# Patient Record
Sex: Female | Born: 1935 | Race: Black or African American | Hispanic: No | Smoking: Former smoker
Health system: Southern US, Community
[De-identification: ages and names within clinical notes are randomized; demographics above are authoritative.]

## PROBLEM LIST (undated history)

## (undated) DIAGNOSIS — R42 Dizziness and giddiness: Secondary | ICD-10-CM

## (undated) DIAGNOSIS — K921 Melena: Secondary | ICD-10-CM

## (undated) DIAGNOSIS — I509 Heart failure, unspecified: Secondary | ICD-10-CM

## (undated) DIAGNOSIS — R7989 Other specified abnormal findings of blood chemistry: Secondary | ICD-10-CM

## (undated) DIAGNOSIS — I421 Obstructive hypertrophic cardiomyopathy: Secondary | ICD-10-CM

## (undated) DIAGNOSIS — N390 Urinary tract infection, site not specified: Secondary | ICD-10-CM

## (undated) DIAGNOSIS — R51 Headache: Secondary | ICD-10-CM

## (undated) DIAGNOSIS — I671 Cerebral aneurysm, nonruptured: Secondary | ICD-10-CM

## (undated) DIAGNOSIS — R079 Chest pain, unspecified: Secondary | ICD-10-CM

## (undated) DIAGNOSIS — K2970 Gastritis, unspecified, without bleeding: Secondary | ICD-10-CM

## (undated) DIAGNOSIS — I2699 Other pulmonary embolism without acute cor pulmonale: Secondary | ICD-10-CM

## (undated) DIAGNOSIS — K760 Fatty (change of) liver, not elsewhere classified: Secondary | ICD-10-CM

## (undated) DIAGNOSIS — E785 Hyperlipidemia, unspecified: Secondary | ICD-10-CM

## (undated) DIAGNOSIS — I609 Nontraumatic subarachnoid hemorrhage, unspecified: Secondary | ICD-10-CM

## (undated) DIAGNOSIS — Z7901 Long term (current) use of anticoagulants: Secondary | ICD-10-CM

## (undated) DIAGNOSIS — I4891 Unspecified atrial fibrillation: Secondary | ICD-10-CM

## (undated) DIAGNOSIS — R0602 Shortness of breath: Secondary | ICD-10-CM

## (undated) DIAGNOSIS — I82409 Acute embolism and thrombosis of unspecified deep veins of unspecified lower extremity: Secondary | ICD-10-CM

## (undated) DIAGNOSIS — N183 Chronic kidney disease, stage 3 (moderate): Secondary | ICD-10-CM

## (undated) DIAGNOSIS — E119 Type 2 diabetes mellitus without complications: Secondary | ICD-10-CM

## (undated) DIAGNOSIS — I480 Paroxysmal atrial fibrillation: Secondary | ICD-10-CM

## (undated) DIAGNOSIS — D689 Coagulation defect, unspecified: Secondary | ICD-10-CM

## (undated) DIAGNOSIS — Z8739 Personal history of other diseases of the musculoskeletal system and connective tissue: Secondary | ICD-10-CM

## (undated) DIAGNOSIS — R011 Cardiac murmur, unspecified: Secondary | ICD-10-CM

## (undated) DIAGNOSIS — I517 Cardiomegaly: Secondary | ICD-10-CM

## (undated) DIAGNOSIS — R002 Palpitations: Secondary | ICD-10-CM

## (undated) DIAGNOSIS — R109 Unspecified abdominal pain: Secondary | ICD-10-CM

## (undated) DIAGNOSIS — M810 Age-related osteoporosis without current pathological fracture: Secondary | ICD-10-CM

## (undated) DIAGNOSIS — I951 Orthostatic hypotension: Secondary | ICD-10-CM

## (undated) DIAGNOSIS — R300 Dysuria: Secondary | ICD-10-CM

## (undated) DIAGNOSIS — I10 Essential (primary) hypertension: Secondary | ICD-10-CM

## (undated) DIAGNOSIS — N200 Calculus of kidney: Secondary | ICD-10-CM

## (undated) DIAGNOSIS — J9601 Acute respiratory failure with hypoxia: Secondary | ICD-10-CM

## (undated) DIAGNOSIS — N644 Mastodynia: Secondary | ICD-10-CM

## (undated) HISTORY — PX: OTHER SURGICAL HISTORY: SHX169

## (undated) HISTORY — DX: Unspecified abdominal pain: R10.9

## (undated) HISTORY — DX: Melena: K92.1

## (undated) HISTORY — DX: Unspecified atrial fibrillation: I48.91

## (undated) HISTORY — DX: Other pulmonary embolism without acute cor pulmonale: I26.99

## (undated) HISTORY — DX: Cardiac murmur, unspecified: R01.1

## (undated) HISTORY — DX: Palpitations: R00.2

## (undated) HISTORY — DX: Shortness of breath: R06.02

## (undated) HISTORY — DX: Chest pain, unspecified: R07.9

## (undated) HISTORY — DX: Chronic kidney disease, stage 3 (moderate): N18.3

## (undated) HISTORY — DX: Cardiomegaly: I51.7

## (undated) HISTORY — DX: Dizziness and giddiness: R42

## (undated) HISTORY — DX: Headache: R51

## (undated) HISTORY — DX: Essential (primary) hypertension: I10

## (undated) HISTORY — DX: Gastritis, unspecified, without bleeding: K29.70

## (undated) HISTORY — DX: Paroxysmal atrial fibrillation: I48.0

## (undated) HISTORY — DX: Long term (current) use of anticoagulants: Z79.01

## (undated) HISTORY — DX: Type 2 diabetes mellitus without complications: E11.9

## (undated) HISTORY — DX: Fatty (change of) liver, not elsewhere classified: K76.0

## (undated) HISTORY — DX: Orthostatic hypotension: I95.1

## (undated) HISTORY — DX: Cerebral aneurysm, nonruptured: I67.1

## (undated) HISTORY — DX: Hyperlipidemia, unspecified: E78.5

## (undated) HISTORY — DX: Other specified abnormal findings of blood chemistry: R79.89

## (undated) HISTORY — DX: Personal history of other diseases of the musculoskeletal system and connective tissue: Z87.39

## (undated) HISTORY — DX: Dysuria: R30.0

## (undated) HISTORY — DX: Urinary tract infection, site not specified: N39.0

## (undated) HISTORY — DX: Acute respiratory failure with hypoxia: J96.01

## (undated) HISTORY — DX: Coagulation defect, unspecified: D68.9

## (undated) HISTORY — DX: Acute embolism and thrombosis of unspecified deep veins of unspecified lower extremity: I82.409

## (undated) HISTORY — DX: Heart failure, unspecified: I50.9

## (undated) HISTORY — DX: Mastodynia: N64.4

## (undated) HISTORY — DX: Calculus of kidney: N20.0

## (undated) HISTORY — DX: Obstructive hypertrophic cardiomyopathy: I42.1

## (undated) HISTORY — DX: Nontraumatic subarachnoid hemorrhage, unspecified: I60.9

---
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
## 1998-09-05 DIAGNOSIS — I480 Paroxysmal atrial fibrillation: Secondary | ICD-10-CM

## 1998-09-05 DIAGNOSIS — I609 Nontraumatic subarachnoid hemorrhage, unspecified: Secondary | ICD-10-CM

## 1998-09-05 HISTORY — DX: Paroxysmal atrial fibrillation: I48.0

## 1998-09-05 HISTORY — DX: Nontraumatic subarachnoid hemorrhage, unspecified: I60.9

## 1998-09-05 HISTORY — PX: ABDOMINAL HYSTERECTOMY: SHX81

## 1998-09-18 ENCOUNTER — Other Ambulatory Visit: Admission: RE | Admit: 1998-09-18 | Discharge: 1998-09-18 | Payer: Self-pay | Admitting: *Deleted

## 1999-03-23 ENCOUNTER — Inpatient Hospital Stay (HOSPITAL_COMMUNITY): Admission: EM | Admit: 1999-03-23 | Discharge: 1999-04-04 | Payer: Self-pay | Admitting: Emergency Medicine

## 1999-03-23 ENCOUNTER — Encounter: Payer: Self-pay | Admitting: Neurosurgery

## 1999-03-24 ENCOUNTER — Encounter: Payer: Self-pay | Admitting: Neurosurgery

## 1999-03-25 ENCOUNTER — Encounter: Payer: Self-pay | Admitting: Neurosurgery

## 1999-03-26 ENCOUNTER — Encounter: Payer: Self-pay | Admitting: Neurosurgery

## 1999-03-27 ENCOUNTER — Encounter: Payer: Self-pay | Admitting: Neurosurgery

## 1999-03-28 ENCOUNTER — Encounter: Payer: Self-pay | Admitting: Neurosurgery

## 1999-03-31 ENCOUNTER — Encounter: Payer: Self-pay | Admitting: Neurosurgery

## 1999-05-18 ENCOUNTER — Encounter: Payer: Self-pay | Admitting: Neurosurgery

## 1999-05-18 ENCOUNTER — Ambulatory Visit (HOSPITAL_COMMUNITY): Admission: RE | Admit: 1999-05-18 | Discharge: 1999-05-18 | Payer: Self-pay | Admitting: Neurosurgery

## 1999-07-10 ENCOUNTER — Encounter: Payer: Self-pay | Admitting: Emergency Medicine

## 1999-07-11 ENCOUNTER — Encounter: Payer: Self-pay | Admitting: Internal Medicine

## 1999-07-11 ENCOUNTER — Inpatient Hospital Stay (HOSPITAL_COMMUNITY): Admission: EM | Admit: 1999-07-11 | Discharge: 1999-07-16 | Payer: Self-pay | Admitting: Emergency Medicine

## 2003-05-20 ENCOUNTER — Encounter: Payer: Self-pay | Admitting: Internal Medicine

## 2003-05-20 ENCOUNTER — Encounter: Admission: RE | Admit: 2003-05-20 | Discharge: 2003-05-20 | Payer: Self-pay | Admitting: Internal Medicine

## 2004-09-30 ENCOUNTER — Ambulatory Visit: Payer: Self-pay | Admitting: Internal Medicine

## 2004-10-14 ENCOUNTER — Ambulatory Visit: Payer: Self-pay | Admitting: Internal Medicine

## 2004-11-12 ENCOUNTER — Ambulatory Visit: Payer: Self-pay | Admitting: Internal Medicine

## 2004-12-30 ENCOUNTER — Ambulatory Visit: Payer: Self-pay | Admitting: Internal Medicine

## 2005-02-28 ENCOUNTER — Ambulatory Visit: Payer: Self-pay | Admitting: Internal Medicine

## 2005-08-12 ENCOUNTER — Ambulatory Visit: Payer: Self-pay | Admitting: Cardiology

## 2005-09-09 ENCOUNTER — Ambulatory Visit: Payer: Self-pay | Admitting: Internal Medicine

## 2005-11-17 ENCOUNTER — Ambulatory Visit: Payer: Self-pay | Admitting: Internal Medicine

## 2006-04-11 ENCOUNTER — Ambulatory Visit: Payer: Self-pay | Admitting: Internal Medicine

## 2006-04-13 ENCOUNTER — Ambulatory Visit: Payer: Self-pay | Admitting: Internal Medicine

## 2006-12-06 ENCOUNTER — Ambulatory Visit: Payer: Self-pay | Admitting: Internal Medicine

## 2006-12-07 ENCOUNTER — Ambulatory Visit: Payer: Self-pay | Admitting: Cardiology

## 2006-12-18 ENCOUNTER — Ambulatory Visit: Payer: Self-pay | Admitting: Internal Medicine

## 2007-05-28 ENCOUNTER — Ambulatory Visit: Payer: Self-pay | Admitting: Internal Medicine

## 2007-06-01 ENCOUNTER — Ambulatory Visit: Payer: Self-pay | Admitting: Internal Medicine

## 2007-06-01 LAB — CONVERTED CEMR LAB
AST: 21 units/L (ref 0–37)
LDL Cholesterol: 87 mg/dL (ref 0–99)
VLDL: 15 mg/dL (ref 0–40)

## 2007-06-11 ENCOUNTER — Ambulatory Visit: Payer: Self-pay | Admitting: Internal Medicine

## 2007-10-11 ENCOUNTER — Ambulatory Visit: Payer: Self-pay | Admitting: Cardiology

## 2008-01-23 ENCOUNTER — Encounter: Payer: Self-pay | Admitting: Internal Medicine

## 2008-03-21 ENCOUNTER — Ambulatory Visit: Payer: Self-pay | Admitting: Internal Medicine

## 2008-05-06 HISTORY — PX: CARDIAC CATHETERIZATION: SHX172

## 2008-05-20 ENCOUNTER — Ambulatory Visit: Payer: Self-pay | Admitting: Cardiology

## 2008-05-20 ENCOUNTER — Ambulatory Visit: Payer: Self-pay | Admitting: Internal Medicine

## 2008-05-20 ENCOUNTER — Observation Stay (HOSPITAL_COMMUNITY): Admission: AD | Admit: 2008-05-20 | Discharge: 2008-05-22 | Payer: Self-pay | Admitting: Internal Medicine

## 2008-05-20 ENCOUNTER — Telehealth: Payer: Self-pay | Admitting: Internal Medicine

## 2008-05-20 DIAGNOSIS — I4891 Unspecified atrial fibrillation: Secondary | ICD-10-CM

## 2008-05-20 DIAGNOSIS — I609 Nontraumatic subarachnoid hemorrhage, unspecified: Secondary | ICD-10-CM

## 2008-05-20 DIAGNOSIS — E1169 Type 2 diabetes mellitus with other specified complication: Secondary | ICD-10-CM | POA: Insufficient documentation

## 2008-05-20 DIAGNOSIS — I421 Obstructive hypertrophic cardiomyopathy: Secondary | ICD-10-CM | POA: Insufficient documentation

## 2008-05-20 DIAGNOSIS — I10 Essential (primary) hypertension: Secondary | ICD-10-CM

## 2008-05-20 DIAGNOSIS — E785 Hyperlipidemia, unspecified: Secondary | ICD-10-CM

## 2008-05-20 HISTORY — DX: Unspecified atrial fibrillation: I48.91

## 2008-05-20 HISTORY — DX: Obstructive hypertrophic cardiomyopathy: I42.1

## 2008-05-30 ENCOUNTER — Ambulatory Visit: Payer: Self-pay | Admitting: Internal Medicine

## 2008-05-30 LAB — CONVERTED CEMR LAB
Bilirubin Urine: NEGATIVE
Nitrite: NEGATIVE

## 2008-12-08 ENCOUNTER — Encounter: Payer: Self-pay | Admitting: Internal Medicine

## 2008-12-08 ENCOUNTER — Ambulatory Visit: Payer: Self-pay | Admitting: Internal Medicine

## 2008-12-08 LAB — CONVERTED CEMR LAB
CO2: 30 meq/L (ref 19–32)
Calcium: 9.5 mg/dL (ref 8.4–10.5)
Creatinine, Ser: 1.3 mg/dL — ABNORMAL HIGH (ref 0.4–1.2)

## 2009-04-02 ENCOUNTER — Encounter: Payer: Self-pay | Admitting: Internal Medicine

## 2009-04-14 ENCOUNTER — Encounter: Payer: Self-pay | Admitting: Internal Medicine

## 2009-05-04 ENCOUNTER — Encounter: Payer: Self-pay | Admitting: Internal Medicine

## 2009-07-21 ENCOUNTER — Encounter (INDEPENDENT_AMBULATORY_CARE_PROVIDER_SITE_OTHER): Payer: Self-pay | Admitting: *Deleted

## 2009-09-03 ENCOUNTER — Ambulatory Visit: Payer: Self-pay | Admitting: Internal Medicine

## 2009-09-03 LAB — CONVERTED CEMR LAB
Bilirubin Urine: NEGATIVE
Protein, U semiquant: NEGATIVE
Specific Gravity, Urine: 1.015
pH: 5

## 2009-09-11 ENCOUNTER — Telehealth: Payer: Self-pay | Admitting: Internal Medicine

## 2009-09-24 ENCOUNTER — Ambulatory Visit: Payer: Self-pay | Admitting: Internal Medicine

## 2009-11-09 ENCOUNTER — Telehealth (INDEPENDENT_AMBULATORY_CARE_PROVIDER_SITE_OTHER): Payer: Self-pay | Admitting: *Deleted

## 2009-12-09 ENCOUNTER — Telehealth: Payer: Self-pay | Admitting: Cardiology

## 2009-12-11 ENCOUNTER — Telehealth: Payer: Self-pay | Admitting: Internal Medicine

## 2009-12-23 ENCOUNTER — Ambulatory Visit: Payer: Self-pay | Admitting: Internal Medicine

## 2009-12-23 LAB — CONVERTED CEMR LAB
Glucose, Urine, Semiquant: NEGATIVE
Nitrite: NEGATIVE
pH: 5

## 2010-06-08 ENCOUNTER — Ambulatory Visit: Payer: Self-pay | Admitting: Internal Medicine

## 2010-06-08 DIAGNOSIS — R51 Headache: Secondary | ICD-10-CM

## 2010-06-08 DIAGNOSIS — R519 Headache, unspecified: Secondary | ICD-10-CM

## 2010-06-08 HISTORY — DX: Headache, unspecified: R51.9

## 2010-08-16 ENCOUNTER — Ambulatory Visit: Payer: Self-pay | Admitting: Internal Medicine

## 2010-08-23 LAB — CONVERTED CEMR LAB
BUN: 24 mg/dL — ABNORMAL HIGH (ref 6–23)
CO2: 28 meq/L (ref 19–32)
Chloride: 106 meq/L (ref 96–112)
Cholesterol: 158 mg/dL (ref 0–200)
Creatinine, Ser: 1.4 mg/dL — ABNORMAL HIGH (ref 0.4–1.2)
Glucose, Bld: 87 mg/dL (ref 70–99)
LDL Cholesterol: 86 mg/dL (ref 0–99)
Total CHOL/HDL Ratio: 3

## 2010-09-07 ENCOUNTER — Encounter: Payer: Self-pay | Admitting: Internal Medicine

## 2010-10-05 NOTE — Progress Notes (Signed)
Summary: REFILL   Phone Note Refill Request Message from:  Patient on December 11, 2009 12:24 PM  Refills Requested: Medication #1:  LOSARTAN 100MG  SEND TO [facility] [address] [phone]  Initial call taken by: Judie Grieve,  December 11, 2009 12:26 PM     Appended Document: REFILL done DAJ    Prescriptions: COZAAR 100 MG TABS (LOSARTAN POTASSIUM) 1 tab once daily  #30 x 6   Entered by:   Burnett Kanaris, CNA   Authorized by:   Sherrill Raring, MD, [facility]   Signed by:   Burnett Kanaris, CNA on 12/14/2009   Method used:   [facility]ically to        [facility]       [address]       [place], [place]  [address]       Ph: [phone] or [phone]       Fax: [phone]   RxID:   [id number]

## 2010-10-05 NOTE — Assessment & Plan Note (Signed)
Summary: 9 MONTH ROV/SL  Medications Added TOPROL XL 100 MG XR24H-TAB (METOPROLOL SUCCINATE) Take 1 tablet by mouth two times a day ZOCOR 40 MG TABS (SIMVASTATIN) Take 1/2  tab by mouth at bedtime ATACAND 32 MG TABS (CANDESARTAN CILEXETIL) 1 tab once daily OMEPRAZOLE 20 MG CPDR (OMEPRAZOLE) 1 every day 1 hour prior to breakfast      Allergies Added: NKDA  Visit Type:  Follow-up Primary Provider:  Jacques Navy MD  CC:  chest discomfort.  History of Present Illness: [name] is a 75 year old with a history of PAF, LVH, subarachnoid hemorrhage.  I last saw her in April of 2010.   Since seen, she notes occasional chest pains usually once every 3 wks.  Some are asssoc with food intake.  Occasionally with activity.  No change in her abiltiy to do things.  She does note increased reflux symptoms with brackish taste in her mouth. She denies palpitations.  Current Medications (verified): 1)  Toprol Xl 100 Mg Xr24h-Tab (Metoprolol Succinate) .... Take 1 Tablet By Mouth Two Times A Day 2)  Cardene Sr 60 Mg Xr12h-Cap (Nicardipine Hcl) .... Take 1 Tablet By Mouth Every Morning 3)  Zocor 40 Mg Tabs (Simvastatin) .... Take 1 Tab By Mouth At Bedtime 4)  Norpace 150 Mg Caps (Disopyramide Phosphate) .... Take 1 Tablet By Mouth Two Times A Day 5)  Atacand 32 Mg Tabs (Candesartan Cilexetil) .Marland Kitchen.. 1 Tab Once Daily  Allergies (verified): No Known Drug Allergies  Past History:  Past Medical History: Last updated: 06/13/2008 HYPERLIPIDEMIA (ICD-272.4) Hx of SUBARACHNOID HEMORRHAGE (ICD-430) CARDIOMYOPATHY, HYPERTROPHIC (ICD-425.1) PAROXYSMAL ATRIAL FIBRILLATION (ICD-427.31) UNSPECIFIED ESSENTIAL HYPERTENSION (ICD-401.9)  Past Surgical History: Last updated: 05/20/2008 Hysterectomy  Social History: Last updated: 05/20/2008 HSG Married '61 3 sons, 1 daughter; 5 grandchildren  Review of Systems       All systems reviewed.  negatve to the above problem except as noted above.  Vital  Signs:  Patient profile:   75 year old female Height:      68 inches Weight:      168 pounds BMI:     25.64 Pulse rate:   58 / minute BP sitting:   132 / 79  (right arm)  Vitals Entered By: Burnett Kanaris, CNA (September 24, 2009 12:41 PM)  Physical Exam  Additional Exam:  HEENT:  Normocephalic, atraumatic. EOMI, PERRLA.  Neck: JVP is normal. No thyromegaly. No bruits.  Lungs: clear to auscultation. No rales no wheezes.  Heart: Regular rate and rhythm. Normal S1, S2. No S3.   Gr. I-II/VI systolic murmur at base.Marland Kitchen PMI not displaced.  Abdomen:  Supple, nontender. Normal bowel sounds. No masses. No hepatomegaly.  Extremities:   Good distal pulses throughout. No lower extremity edema.  Musculoskeletal :moving all extremities.  Neuro:   alert and oriented x3.    EKG  Procedure date:  09/24/2009  Findings:      Sinus bradycardia 58 bpm.  LVH with strain pattern.  Impression & Recommendations:  Problem # 1:  PAROXYSMAL ATRIAL FIBRILLATION (ICD-427.31) No sysmptoms to suggest recurance.  No change in meds. Her updated medication list for this problem includes:    Toprol Xl 100 Mg Xr24h-tab (Metoprolol succinate) .Marland Kitchen... Take 1 tablet by mouth two times a day    Norpace 150 Mg Caps (Disopyramide phosphate) .Marland Kitchen... Take 1 tablet by mouth two times a day  Problem # 2:  CHEST PAIN-UNSPECIFIED (ICD-786.50) I am not convinced the patients occasional episodes represent angina.  THey  sound more GI with increased reflux symptoms.  She had a normal catheterizaition in 2009. I would recomm Omeprazole to see if symtoms can be controlled.  She will call if things worsen.  Watch diet.  Problem # 3:  UNSPECIFIED ESSENTIAL HYPERTENSION (ICD-401.9) good control.  Keep on same meds.  Will set up for BMET.  Problem # 4:  HYPERLIPIDEMIA (ICD-272.4) The patient has cut her Zocor to 20 once daily.  Will set up for fasting lipids. Prescriptions: OMEPRAZOLE 20 MG CPDR (OMEPRAZOLE) 1 every day 1 hour prior  to breakfast  #30 x 6   Entered by:   Layne Benton, RN, BSN   Authorized by:   Sherrill Raring, MD, [facility]   Signed by:   Layne Benton, RN, BSN on 09/24/2009   Method used:   [facility] to        [facility]       [address]       [place], [place]  [address]       Ph: [phone] or [phone]       Fax: [phone]   RxID:   [id number]

## 2010-10-05 NOTE — Progress Notes (Signed)
Summary: Urinary frequency  Phone Note Call from Patient   Summary of Call: Pt left vm with update. Rx helped with pain but she continues to c/o urinary frequency.  Initial call taken by: Lamar Sprinkles, CMA,  September 11, 2009 3:10 PM  Follow-up for Phone Call        OK for trial of vesicare  samples monday or if patient wishes Rx can be called in for vesicare 5mg  #10, sig 1 by mouth once daily, If this works would then use generic oxybutynin 5mg  two times a day.  Follow-up by: Jacques Navy MD,  September 11, 2009 5:18 PM  Additional Follow-up for Phone Call Additional follow up Details #1::        left mess to call office back on home #, Wk # unable to speak with someone. Samples up front waiting. Additional Follow-up by: Lamar Sprinkles, CMA,  September 14, 2009 11:01 AM    Additional Follow-up for Phone Call Additional follow up Details #2::    Pt informed, she has samples that she has not tried yet given at last office visit. Pt will call back after trial  Follow-up by: Lamar Sprinkles, CMA,  September 14, 2009 12:55 PM   Appended Document: Urinary frequency Samples not picked up. Sent back to be restocked.

## 2010-10-05 NOTE — Assessment & Plan Note (Signed)
Summary: pain left side, offered sda pt req this day/cd   Vital Signs:  Patient profile:   75 year old female Height:      68 inches (172.72 cm) Weight:      163 pounds (74.09 kg) BMI:     24.87 O2 Sat:      97 % on Room air Temp:     97.1 degrees F (36.17 degrees C) oral Pulse rate:   57 / minute Pulse rhythm:   regular BP sitting:   128 / 84  (left arm) Cuff size:   regular  Vitals Entered By: Brenton Grills (December 23, 2009 4:43 PM)  O2 Flow:  Room air CC: pt c/o pain on left side, blood in urine, frequent urination, urinating a small amount when going/try to get u dip/pt could not urinate, small amount of urine/aj   Primary Care Provider:  Jacques Navy MD  CC:  pt c/o pain on left side, blood in urine, frequent urination, urinating a small amount when going/try to get u dip/pt could not urinate, and small amount of urine/aj.  History of Present Illness: Patient presents for pain in the suprapubic and groin areas. No fever or chills. NO back pain. She does admit to urgency and frequency.  Current Medications (verified): 1)  Toprol Xl 100 Mg Xr24h-Tab (Metoprolol Succinate) .... Take 1 Tablet By Mouth Two Times A Day 2)  Zocor 40 Mg Tabs (Simvastatin) .... Take 1/2  Tab By Mouth At Bedtime 3)  Norpace 150 Mg Caps (Disopyramide Phosphate) .... Take 1 Tablet By Mouth Two Times A Day 4)  Atacand 32 Mg Tabs (Candesartan Cilexetil) .Marland Kitchen.. 1 Tab Once Daily 5)  Omeprazole 20 Mg Cpdr (Omeprazole) .Marland Kitchen.. 1 Every Day 1 Hour Prior To Breakfast 6)  Nifedipine 30 Mg Xr24h-Tab (Nifedipine) .Marland Kitchen.. 1 Every Day 7)  Cozaar 100 Mg Tabs (Losartan Potassium) .Marland Kitchen.. 1 Tab Once Daily  Allergies (verified): No Known Drug Allergies  Past History:  Past Medical History: Last updated: 06/13/2008 HYPERLIPIDEMIA (ICD-272.4) Hx of SUBARACHNOID HEMORRHAGE (ICD-430) CARDIOMYOPATHY, HYPERTROPHIC (ICD-425.1) PAROXYSMAL ATRIAL FIBRILLATION (ICD-427.31) UNSPECIFIED ESSENTIAL HYPERTENSION  (ICD-401.9)  Past Surgical History: Last updated: 05/20/2008 Hysterectomy  Review of Systems  The patient denies anorexia, fever, decreased hearing, chest pain, dyspnea on exertion, peripheral edema, headaches, abdominal pain, muscle weakness, and difficulty walking.    Physical Exam  General:  alert, well-developed, well-nourished, and well-hydrated.   Head:  normocephalic and atraumatic.   Eyes:  corneas and lenses clear.   Lungs:  normal respiratory effort.   Heart:  normal rate and regular rhythm.   Abdomen:  tender to palpation in the suprapubic area and to percussion over the flanks Msk:  No deformity or scoliosis noted of thoracic or lumbar spine.   Pulses:  2+ radial Neurologic:  alert & oriented X3 and cranial nerves II-XII intact.   Skin:  turgor normal and color normal.   Psych:  Oriented X3 and normally interactive.     Impression & Recommendations:  Problem # 1:  UTI (ICD-599.0)  symptoms of UTI. U/A with blood and ketones.  Plan - tmp/smx ds two times a day x 5 days.   Her updated medication list for this problem includes:    Sulfamethoxazole-tmp Ds 800-160 Mg Tabs (Sulfamethoxazole-trimethoprim) .Marland Kitchen... 1 by mouth two times a day x 5 days  Orders: UA Dipstick w/o Micro (manual) (54098)  Complete Medication List: 1)  Toprol Xl 100 Mg Xr24h-tab (Metoprolol succinate) .... Take 1 tablet by mouth two times  a day 2)  Zocor 40 Mg Tabs (Simvastatin) .... Take 1/2  tab by mouth at bedtime 3)  Norpace 150 Mg Caps (Disopyramide phosphate) .... Take 1 tablet by mouth two times a day 4)  Atacand 32 Mg Tabs (Candesartan cilexetil) .Marland Kitchen.. 1 tab once daily 5)  Omeprazole 20 Mg Cpdr (Omeprazole) .Marland Kitchen.. 1 every day 1 hour prior to breakfast 6)  Nifedipine 30 Mg Xr24h-tab (Nifedipine) .Marland Kitchen.. 1 every day 7)  Cozaar 100 Mg Tabs (Losartan potassium) .Marland Kitchen.. 1 tab once daily 8)  Sulfamethoxazole-tmp Ds 800-160 Mg Tabs (Sulfamethoxazole-trimethoprim) .Marland Kitchen.. 1 by mouth two times a day x 5  days Prescriptions: SULFAMETHOXAZOLE-TMP DS 800-160 MG TABS (SULFAMETHOXAZOLE-TRIMETHOPRIM) 1 by mouth two times a day x 5 days  #10 x 1   Entered and Authorized by:   Jacques Navy MD   Signed by:   Jacques Navy MD on 12/23/2009   Method used:   [facility] to        [facility]       [address]       [place], [place]  [address]       Ph: [phone] or [phone]       Fax: [phone]   RxID:   [id number]   Laboratory Results   Urine Tests   Date/Time Reported: Lamar Sprinkles, CMA  December 23, 2009 5:18 PM   Routine Urinalysis   Color: straw Appearance: Hazy Glucose: negative   (Normal Range: Negative) Bilirubin: negative   (Normal Range: Negative) Ketone: negative   (Normal Range: Negative) Spec. Gravity: 1.025   (Normal Range: 1.003-1.035) Blood: moderate   (Normal Range: Negative) pH: 5.0   (Normal Range: 5.0-8.0) Protein: trace   (Normal Range: Negative) Urobilinogen: negative   (Normal Range: 0-1) Nitrite: negative   (Normal Range: Negative) Leukocyte Esterace: moderate   (Normal Range: Negative)

## 2010-10-05 NOTE — Progress Notes (Signed)
Summary: CARDENE on Backorder  Medications Added NIFEDIPINE 30 MG XR24H-TAB (NIFEDIPINE) 1 every day       Phone Note From Pharmacy    Follow-up for Phone Call        Recieved call from patient's pharmacy stating that Cardene is on long term back order and we need to change to another medication. Discussed with Shelby Dubin pharmD and Dr. Tenny Craw .Marland Kitchenok to DC Cardene and start Procardia XL 30mg  1 ever day and a bp check in 5 to 7 days. LMOM for patient  to call me back. Follow-up by: Suzan Garibaldi RN  Additional Follow-up for Phone Call Additional follow up Details #1::        Patient called back...she is aware of above and will have BP checked and will let us  know the results. Additional Follow-up by: J Claud Gowan RN    New/Updated Medications: NIFEDIPINE 30 MG XR24H-TAB (NIFEDIPINE) 1 every day Prescriptions: NIFEDIPINE 30 MG XR24H-TAB (NIFEDIPINE) 1 every day  #30 x 6   Entered by:   Layne Benton, RN, BSN   Authorized by:   Sherrill Raring, MD, [facility]   Signed by:   Layne Benton, RN, BSN on 11/09/2009   Method used:   [facility] to        [facility]       [address]       [place], [place]  [address]       Ph: [phone] or [phone]       Fax: [phone]   RxID:   [id number]

## 2010-10-05 NOTE — Assessment & Plan Note (Signed)
Summary: PER PT D/T---HEAD PAINS--STC   Vital Signs:  Patient profile:   75 year old female Height:      68 inches Weight:      167 pounds BMI:     25.48 O2 Sat:      96 % on Room air Temp:     97.8 degrees F oral Pulse rate:   57 / minute BP sitting:   142 / 84  (left arm) Cuff size:   regular  Vitals Entered By: Bill Salinas CMA (June 08, 2010 2:10 PM)  O2 Flow:  Room air CC: pt here for evaluation of what she describes as a burning pain sensation/ ab, CHF Management Comments Pt has never had pneumonia or shingles vaccine   Primary Care Monique [name]:  Jacques Navy MD  CC:  pt here for evaluation of what she describes as a burning pain sensation/ ab and CHF Management.  History of Present Illness: Monique [name] is a 75 y.o. African American female who presents with a one-month duration of a sharp and sometimes burning headache in the frontal/parietal/occipetal portion of her left head. Her past medical history includes a burst blood vessel ten years ago. In order to alleviate the pain, Monique [name] has been taking Ibuprofen which has been working well so far. The headaches are precipitated by a state which she describes as "glassy" or "shiny" vision. She further described these episodes and where she has a veil cover her eyes, which makes it hard to distinguish faces, object, etc. However, she claims that she can still see her general environment. Her headaches occur during the daytime and can be instigated by bright lights. In addition to Ibuprofen, Monique [name] lays down in a dark room to help alleviate her symptoms. Her headaches do not occur daily and are rather random.  Current Medications (verified): 1)  Toprol Xl 100 Mg Xr24h-Tab (Metoprolol Succinate) .... Take 1 Tablet By Mouth Two Times A Day 2)  Zocor 40 Mg Tabs (Simvastatin) .... Take 1/2  Tab By Mouth At Bedtime 3)  Norpace 150 Mg Caps (Disopyramide Phosphate) .... Take 1 Tablet By Mouth Two Times A Day 4)  Atacand 32 Mg Tabs  (Candesartan Cilexetil) .Marland Kitchen.. 1 Tab Once Daily 5)  Omeprazole 20 Mg Cpdr (Omeprazole) .Marland Kitchen.. 1 Every Day 1 Hour Prior To Breakfast 6)  Nifedipine 30 Mg Xr24h-Tab (Nifedipine) .Marland Kitchen.. 1 Every Day 7)  Cozaar 100 Mg Tabs (Losartan Potassium) .Marland Kitchen.. 1 Tab Once Daily  Allergies (verified): No Known Drug Allergies  Past History:  Past Medical History: Last updated: 06/13/2008 HYPERLIPIDEMIA (ICD-272.4) Hx of SUBARACHNOID HEMORRHAGE (ICD-430) CARDIOMYOPATHY, HYPERTROPHIC (ICD-425.1) PAROXYSMAL ATRIAL FIBRILLATION (ICD-427.31) UNSPECIFIED ESSENTIAL HYPERTENSION (ICD-401.9)  Past Surgical History: Last updated: 05/20/2008 Hysterectomy PSH reviewed for relevance, FH reviewed for relevance  Review of Systems       The patient complains of vision loss and headaches.  The patient denies fever, decreased hearing, chest pain, syncope, dyspnea on exertion, abdominal pain, severe indigestion/heartburn, muscle weakness, transient blindness, and difficulty walking.         Vision loss in the sense of decreased detailed sight.  Physical Exam  General:  alert, well-developed, well-nourished, appropriate dress, and cooperative to examination.   Head:  normocephalic, atraumatic, and no abnormalities palpated.   Eyes:  vision grossly intact, pupils equal, pupils round, pupils reactive to light, pupils react to accomodation, and corneas and lenses clear.   Ears:  R ear normal and L ear normal.   Nose:  no external  deformity, no external erythema, and no nasal discharge.   Neck:  no cervical lymphadenopathy and no neck tenderness.   Neurologic:  alert & oriented X3 and cranial nerves II-XII intact.     Impression & Recommendations:  Problem # 1:  HEADACHE (ICD-784.0) Assessment New  Her updated medication list for this problem includes:    Toprol Xl 100 Mg Xr24h-tab (Metoprolol succinate) .Marland Kitchen... Take 1 tablet by mouth two times a day  Patient presenting with a headache variant suggestive of tension  headache releived by NSAIDs. Her neurologic Assesment Normal  Plan - Updated Medication- continue using Ibuprofen as needed for pain           Educated about symptoms associated with CVA, Brain Aneurysm        -slurring words, sudden blindness, blurry vision  Complete Medication List: 1)  Toprol Xl 100 Mg Xr24h-tab (Metoprolol succinate) .... Take 1 tablet by mouth two times a day 2)  Zocor 40 Mg Tabs (Simvastatin) .... Take 1/2  tab by mouth at bedtime 3)  Norpace 150 Mg Caps (Disopyramide phosphate) .... Take 1 tablet by mouth two times a day 4)  Atacand 32 Mg Tabs (Candesartan cilexetil) .Marland Kitchen.. 1 tab once daily 5)  Omeprazole 20 Mg Cpdr (Omeprazole) .Marland Kitchen.. 1 every day 1 hour prior to breakfast 6)  Nifedipine 30 Mg Xr24h-tab (Nifedipine) .Marland Kitchen.. 1 every day 7)  Cozaar 100 Mg Tabs (Losartan potassium) .Marland Kitchen.. 1 tab once daily  CHF Assessment/Plan:      The patient's current weight is 167 pounds.  Her previous weight was 163 pounds.

## 2010-10-05 NOTE — Progress Notes (Signed)
Summary: rx toprol   Phone Note Refill Request Call back at Home Phone [phone] Message from:  Patient on December 09, 2009 2:33 PM  Refills Requested: Medication #1:  TOPROL XL 100 MG XR24H-TAB Take 1 tablet by mouth two times a day [facility] [address]    Method Requested: Fax to Local Pharmacy Initial call taken by: Lorne Skeens,  December 09, 2009 2:34 PM    Prescriptions: TOPROL XL 100 MG XR24H-TAB (METOPROLOL SUCCINATE) Take 1 tablet by mouth two times a day  #60 x 11   Entered by:   Danielle Rankin, CMA   Authorized by:   Gaylord Shih, MD, [facility]   Signed by:   Danielle Rankin, CMA on 12/09/2009   Method used:   Telephoned to ...       [facility]       [address]       [place], [place]  [address]       Ph: [phone] or [phone]       Fax: [phone]   RxID:   [id number]

## 2010-10-07 NOTE — Assessment & Plan Note (Signed)
Summary: per check out/sf   Visit Type:  Follow-up Primary Tacha Manni:  Jacques Navy MD  CC:  8 month ROV; requesting to have lipids checked.  History of Present Illness: Monique [name] is a 75 year old with a history of PAF, LVH, subarachnoid hemorrhage.  I last saw her earlier this year.  Since seen she notes only an occasional skip.  Denies SOB.  No chest pain.  She has has some headaches.    Current Medications (verified): 1)  Toprol Xl 100 Mg Xr24h-Tab (Metoprolol Succinate) .... Take 1 Tablet By Mouth Two Times A Day 2)  Zocor 40 Mg Tabs (Simvastatin) .... Take 1/2  Tab By Mouth At Bedtime 3)  Norpace 150 Mg Caps (Disopyramide Phosphate) .... Take 1 Tablet By Mouth Two Times A Day 4)  Omeprazole 20 Mg Cpdr (Omeprazole) .Marland Kitchen.. 1 Every Day 1 Hour Prior To Breakfast 5)  Nifedipine 30 Mg Xr24h-Tab (Nifedipine) .Marland Kitchen.. 1 Every Day 6)  Cozaar 100 Mg Tabs (Losartan Potassium) .Marland Kitchen.. 1 Tab Once Daily  Allergies (verified): No Known Drug Allergies  Past History:  Family History: Last updated: 05/20/2008 father - 1909: good health for age mother-deceased @ 26: CVA, breast cancer Neg -colon cancer; CAD/MI  Social History: Last updated: 05/20/2008 HSG Married '61 3 sons, 1 daughter; 5 grandchildren  Risk Factors: Caffeine Use: 1 (05/20/2008)  Risk Factors: Smoking Status: never (05/20/2008)  Past medical, surgical, family and social histories (including risk factors) reviewed, and no changes noted (except as noted below).  Past Medical History: Reviewed history from 06/13/2008 and no changes required. HYPERLIPIDEMIA (ICD-272.4) Hx of SUBARACHNOID HEMORRHAGE (ICD-430) CARDIOMYOPATHY, HYPERTROPHIC (ICD-425.1) PAROXYSMAL ATRIAL FIBRILLATION (ICD-427.31) UNSPECIFIED ESSENTIAL HYPERTENSION (ICD-401.9)  Past Surgical History: Reviewed history from 05/20/2008 and no changes required. Hysterectomy  Family History: Reviewed history from 05/20/2008 and no changes required. father -  108: good health for age mother-deceased @ 65: CVA, breast cancer Neg -colon cancer; CAD/MI  Social History: Reviewed history from 05/20/2008 and no changes required. HSG Married '61 3 sons, 1 daughter; 5 grandchildren  Review of Systems       All systems reviewed.  Neg to the above problem except as noted above.  Vital Signs:  Patient profile:   75 year old female Height:      68 inches Weight:      164 pounds BMI:     25.03 Pulse rate:   64 / minute Pulse rhythm:   regular BP sitting:   164 / 100  (left arm) Cuff size:   regular  Vitals Entered By: Stanton Kidney, EMT-P (August 16, 2010 2:57 PM)  Physical Exam  Additional Exam:  Patient is in NAD HEENT:  Normocephalic, atraumatic. EOMI, PERRLA.  Neck: JVP is normal. No thyromegaly. No bruits.  Lungs: clear to auscultation. No rales no wheezes.  Heart: Regular rate and rhythm. Normal S1, S2. No S3.   No significant murmurs. PMI not displaced.  Abdomen:  Supple, nontender. Normal bowel sounds. No masses. No hepatomegaly.  Extremities:   Good distal pulses throughout. No lower extremity edema.  Musculoskeletal :moving all extremities.  Neuro:   alert and oriented x3.    Impression & Recommendations:  Problem # 1:  PAROXYSMAL ATRIAL FIBRILLATION (ICD-427.31) Doing well.  Keep on current regiem. Her updated medication list for this problem includes:    Toprol Xl 100 Mg Xr24h-tab (Metoprolol succinate) .Marland Kitchen... Take 1 tablet by mouth two times a day    Norpace 150 Mg Caps (Disopyramide phosphate) .Marland Kitchen... Take  1 tablet by mouth two times a day  Problem # 2:  UNSPECIFIED ESSENTIAL HYPERTENSION (ICD-401.9) BP on my check was 150/90.    She says itt is not usually elevated  I would have her closely follow.  Would not change meds for now. The following medications were removed from the medication list:    Atacand 32 Mg Tabs (Candesartan cilexetil) .Marland Kitchen... 1 tab once daily Her updated medication list for this problem includes:     Toprol Xl 100 Mg Xr24h-tab (Metoprolol succinate) .Marland Kitchen... Take 1 tablet by mouth two times a day    Nifedipine 30 Mg Xr24h-tab (Nifedipine) .Marland Kitchen... 1 every day    Cozaar 100 Mg Tabs (Losartan potassium) .Marland Kitchen... 1 tab once daily  Problem # 3:  HYPERLIPIDEMIA (ICD-272.4) Check lipids and BMET. Her updated medication list for this problem includes:    Zocor 40 Mg Tabs (Simvastatin) .Marland Kitchen... Take 1/2  tab by mouth at bedtime  Orders: TLB-AST (SGOT) (84450-SGOT) TLB-BMP (Basic Metabolic Panel-BMET) (80048-METABOL) TLB-Lipid Panel (80061-LIPID)  Patient Instructions: 1)  Your physician recommends that you schedule a follow-up appointment in: next fall with Dr. Tenny Craw 2)  Your physician recommends that you continue on your current medications as directed. Please refer to the Current Medication list given to you today. 3)  Your physician recommends that you have  FASTING lipid profile,bmet, and AST  today.

## 2010-12-15 ENCOUNTER — Other Ambulatory Visit: Payer: Self-pay | Admitting: *Deleted

## 2010-12-15 MED ORDER — METOPROLOL SUCCINATE ER 100 MG PO TB24: 100.0000 mg | ORAL_TABLET | Freq: Two times a day (BID) | ORAL | Status: DC

## 2011-01-05 ENCOUNTER — Other Ambulatory Visit: Payer: Self-pay | Admitting: *Deleted

## 2011-01-05 MED ORDER — METOPROLOL SUCCINATE ER 100 MG PO TB24: 100.0000 mg | ORAL_TABLET | Freq: Two times a day (BID) | ORAL | Status: DC

## 2011-01-12 ENCOUNTER — Telehealth: Payer: Self-pay | Admitting: Internal Medicine

## 2011-01-12 MED ORDER — NIFEDIPINE ER OSMOTIC RELEASE 30 MG PO TB24: 30.0000 mg | ORAL_TABLET | Freq: Every day | ORAL | Status: DC

## 2011-01-12 NOTE — Telephone Encounter (Signed)
[facility]edip[facility] [address] [phone]

## 2011-01-18 NOTE — Assessment & Plan Note (Signed)
[facility]                            CARDIOLOGY OFFICE NOTE   NAME:Monique [name], [name]                     MRN:          [id number]  DATE:05/30/2008                            DOB:          1936/02/22    IDENTIFICATION:  Ms. Kazee is a 75 year old woman with a history of  atrial fibrillation, reported hypertrophic cardiomyopathy and history of  subarachnoid hemorrhage.  I last saw her in July.   The patient was actually just admitted to [facility] on May 20, 2008, with chest pain and she underwent cardiac catheterization, this  was done on May 21, 2008, LVEF was 60%.  There was no gradient on  pullback actually.  Coronary arteries were without disease.  It was a  right dominant system.  The patient was discharged home.  She has  followup in GI pending.   Since discharge, she denies shortness of breath.  No chest pain.  No  palpitations.  She has had a little discomfort in her right lower  quadrant after the procedure.  She thinks it is when she urinates.  No  blood in her urine.   CURRENT MEDICINES:  1. Norpace 150 b.i.d.  2. Zocor 40.  3. Atacand 32.  4. Cardene 60 a.m. and 30 p.m.  5. Toprol-XL 100 b.i.d.   PHYSICAL EXAMINATION:  GENERAL:  The patient is in no distress at rest.  VITAL SIGNS:  Blood pressure 131/81, pulse is 71 and regular, weight  165.  NECK:  JVP is normal.  LUNGS:  Clear.  CARDIAC:  Regular rate and rhythm, grade 2-3/6 systolic murmur heard  best at the base.  ABDOMEN:  Benign.  EXTREMITIES:  Right groin without hematoma or bruit.   A 12-lead EKG shows normal sinus rhythm at 71 beats per minute, LVH with  repolarization abnormality.   IMPRESSION:  1. Chest pain, normal catheterization, gastrointestinal workup      pending.  I think this cath was very important since she is on an      antiarrhythmic, also important with the finding of no significant      gradient.  She does have a murmur on exam, but  again no gradient      pullback.  It may just be narrowing of the LVOT area as previously      thought but not significant clinically.  I would continue to follow      on her medicines.  Again, follow up with Gastrointestinal.  2. Paroxysmal atrial fibrillation, on Norpace.  Again, will need      followup.  3. Not on aspirin because of subarachnoid hemorrhage.  4. Dyslipidemia, doing well on 40 of Zocor.  We will need to check to      see if this was checked in the hospital.   Otherwise, I will set followup in the spring, sooner if problems  develop.     Pricilla Riffle, MD, [facility]  Electronically Signed    PVR/MedQ  DD: 05/30/2008  DT: 05/31/2008  Job #: [id number]

## 2011-01-18 NOTE — Assessment & Plan Note (Signed)
[facility]                            CARDIOLOGY OFFICE NOTE   NAME:Monique [name]                     MRN:          [id number]  DATE:05/28/2007                            DOB:          05/13/1936    IDENTIFICATION:  Ms. Monique [name] is a 75 year old woman who was seen in the  cardiology clinic back in April.   She has a history of:  1. Intermittent atrial fibrillation.  2. Hypertrophic cardiomyopathy.  3. Probable diastolic dysfunction.  4. History of subarachnoid hemorrhage.   Since being seen she still has problems with posterior headache,  occasional left frontal headache.  She has not heard back from primary  care regarding the CT results.   She notes occasional palpitations but they last only a couple of seconds  at most.  No shortness of breath.  No chest pain.   CURRENT MEDICATIONS:  1. Cardene 60 mg a.m., 30 mg p.m.  2. Norvasc 150 b.i.d.  3. Zocor 40.  4. Atacand 30.  5. Toprol XL 20.   PHYSICAL EXAMINATION:  GENERAL:  The patient is in no distress.  VITAL SIGNS:  Her blood pressure is 148/79 on arrival.  On my check  140/90, pulse 66, weight 172.  LUNGS:  Clear.  CARDIAC:  Regular rate and rhythm.  S1 S2.  No S3.  No definite murmurs.  ABDOMEN:  Benign.  EXTREMITIES:  No edema.   IMPRESSION:  1. Hypertension.  A little high.  I would have her check these and I      will follow up in a few months.  2. History of intermittent atrial fibrillation.  It sounds like she is      maintaining a sinus rhythm based on symptoms.  Continue to follow.  3. Headaches.  Again, follow up with Dr. Debby Bud.  CT showed only      bilateral chronic subdural, low density fluid collection overlying      frontal lobes.  No other abnormalities noted.  Question of some      muscular component.  She notes some stiffness in the back of her      neck.  I note it is a little tense and told her about applying heat      to the area.  4. Dyslipidemia.  Need to  send the patient for a fasting lipid panel      since she is on the Zocor.   I will set followup in the winter, sooner if problems develop.     Pricilla Riffle, MD, [facility]  Electronically Signed    PVR/MedQ  DD: 05/28/2007  DT: 05/28/2007  Job #: [id number]   cc:   Chilton Greathouse

## 2011-01-18 NOTE — Assessment & Plan Note (Signed)
[facility]                            CARDIOLOGY OFFICE NOTE   NAME:JONESLakishia, Monique [name]                     MRN:          [id number]  DATE:03/21/2008                            DOB:          1936/08/20    IDENTIFICATION:  Monique [name] is a woman I follow in clinic.  She has a  history of paroxysmal atrial fibrillation, hypertrophic cardiomyopathy,  and history of subarachnoid hemorrhage.  She was last seen in clinic  actually by Juanito Doom back in February.   In the interval, she comes back.  She has been doing fairly well.  Denies chest pressure.  No significant shortness of breath.  No  palpitations.   CURRENT MEDICINES:  1. Norpace 150 b.i.d.  2. Zocor 40.  3. Atacand 32.  4. Toprol-XL 200.  5. Cardene 30.   PHYSICAL EXAMINATION:  GENERAL:  The patient is in no distress.  VITAL SIGNS:  Blood pressure is 130/76, pulse is 64 and regular, and  weight 169.  LUNGS:  Clear.  CARDIAC:  Regular rate and rhythm.  S1and S2.  No S3.  No significant  murmurs.  ABDOMEN:  Benign.  No hepatomegaly.  EXTREMITIES:  No edema.   A 12-lead EKG showed normal sinus rhythm at 61 beats per minute.  LVH  with strain pattern.   IMPRESSION:  1. Intermittent atrial fibrillation.  Has maintained sinus rhythm.      Would continue.  Not a Coumadin candidate.  2. Hypertrophic cardiomyopathy.  Last echocardiogram actually was done      in 2005, it had moderate concentric left ventricular hypertrophy      noted.  Interventricular septum noted to be 13, and left      ventricular posterior wall actually 10.  Would follow clinically.      Note, there was evidence of diastolic dysfunction on the      echocardiogram.  She seems to be asymptomatic clinically.  3. Dyslipidemia.  Will be seen by Dr. Debby Bud this fall to have fasting      lipids done.   Otherwise, I will set followup for 9 months or sooner if problems  develop.    Pricilla Riffle, MD, [facility]  Electronically  Signed   PVR/MedQ  DD: 03/21/2008  DT: 03/22/2008  Job #: [id number]   cc:   Rosalyn Gess. Norins, MD

## 2011-01-18 NOTE — Discharge Summary (Signed)
NAMEMADORA, BARLETTA NO.:  [id number]   MEDICAL RECORD NO.:  [id number]          PATIENT TYPE:  INP   LOCATION:  2036                         FACILITY:  [facility]   PHYSICIAN:  Pricilla Riffle, MD, FACCDATE OF BIRTH:  Sep 15, 1935   DATE OF ADMISSION:  05/20/2008  DATE OF DISCHARGE:  05/22/2008                               DISCHARGE SUMMARY   PRIMARY CARDIOLOGIST:  Pricilla Riffle, MD, [facility].   PRIMARY CARE Tymar Polyak:  Rosalyn Gess. Norins, MD.   DISCHARGE DIAGNOSIS:  Chest pain.   SECONDARY DIAGNOSES:  1. Hyperlipidemia.  2. History of subarachnoid hemorrhage.  3. Hypertrophic cardiomyopathy.  4. Paroxysmal atrial fibrillation (not a Coumadin candidate secondary      to subarachnoid hemorrhage).  5. Hypertension.  6. Status post hysterectomy.   ALLERGIES:  No known drug allergies.   PROCEDURES:  Left heart cardiac catheterization revealing normal  coronary arteries, normal left ventricular function, ejection fraction  of 60%.   HISTORY OF PRESENT ILLNESS:  A 75 year old female with prior history of  paroxysmal atrial fibrillation and hypertrophic cardiomyopathy who  apparently was in her usual state of health on May 19, 2008, when  she began to experience intermittent substernal chest discomfort  occurring with rest and exertion without associated symptoms, lasting 30  minutes and resolving spontaneously.  I saw Dr. Debby Bud on May 20, 2008, and decision was made to admit the patient to the hospital from  the office to rule out any cardiac evaluation.   HOSPITAL COURSE:  The patient was admitted by primary care, and we were  currently consulted.  She ruled out for MI by cardiac markers and her  ECG showed poor R-wave progression and left axis deviation.  It was felt  that she would benefit most from cardiac catheterization to rule out  obstructive coronary artery disease.  Ms. Runyon is taken to the cath lab  on May 21, 2008, and underwent left  heart cardiac catheterization  revealing normal coronary arteries with normal LV function, EF of 60%,  no wall motion abnormalities.  Postprocedure, she has been ambulating  without recurrent symptoms or limitations.  We will continue her  previous home medications and I have arranged for outpatient GI  followup.   DISCHARGE LABORATORY DATA:  Hemoglobin 13.1, hematocrit 38.8, WBC 8.9,  platelets 151, and MCV 95.9.  Sodium 138, potassium 4.1, chloride 102,  CO2 26, BUN 14, creatinine 1.20, and glucose 94.  INR 1.0.  Total  bilirubin 0.9, alkaline phosphatase 74, AST 30, ALT 15, albumin 3.9.  CK  132, MB 6.7, and troponin I 0.03.  Total cholesterol 130, triglycerides  71, __________, calcium 9.1.   DISPOSITION:  The patient is being discharged home today in good  condition.   FOLLOWUP PLANS AND APPOINTMENTS:  We have arranged for followup with Dr.  Dietrich Pates on May 30, 2008, at 9:15 a.m.  She is to follow up with  Dr. Claudette Head on June 16, 2008, at 3 p.m.   DISCHARGE MEDICATIONS:  1. Aspirin 81 mg daily.  2. Norpace 150 mg b.i.d.  3. Toprol-XL 100 mg b.i.d.  4. Cardene 60 mg q.a.m., 30 mg q.p.m.  5. Zocor 40 mg nightly.  6. Atacand 32 mg daily.   OUTSTANDING LAB STUDIES:  None.   DURATION OF DISCHARGE/ENCOUNTER:  Forty minutes including physician  time.      Nicolasa Ducking, ANP      Pricilla Riffle, MD, [facility]  Electronically Signed    CB/MEDQ  D:  05/22/2008  T:  05/23/2008  Job:  [id number]   cc:   Venita Lick. Russella Dar, MD, [name]

## 2011-01-18 NOTE — Consult Note (Signed)
NAMEJACQUITA, Monique [name] NO.:  [id number]   MEDICAL RECORD NO.:  [id number]          PATIENT TYPE:  INP   LOCATION:  2036                         FACILITY:  [facility]   PHYSICIAN:  Madolyn Frieze. Jens Som, MD, FACCDATE OF BIRTH:  02-25-1936   DATE OF CONSULTATION:  05/20/2008  DATE OF DISCHARGE:                                 CONSULTATION   Monique [name] is a pleasant 75 year old female with a past medical history  of hypertrophic cardiomyopathy, hypertension, hyperlipidemia, paroxysmal  atrial fibrillation, subarachnoid hemorrhage who I am asked to evaluate  for chest pain.  The patient is followed by Dr. Tenny Craw in the office.  Note, she is not on Coumadin given her history of subarachnoid  hemorrhage.  The patient was last seen by Dr. Tenny Craw in March 21, 2008.  She was seen by Dr. Debby Bud today with complaints of chest pain.  The  pain started yesterday by her report.  It is substernal in location.  She is a very difficult historian but describes it as heaviness.  There  is also some associated bilateral arm aching.  It occurs with work and  also at rest.  It lasts approximately 30 minutes and resolves  spontaneously.  It is not pleuritic or positional nor is it related to  food.  It can increase with exertion but it also occurs at rest.  There  is no associated nausea, vomiting, shortness of breath or diaphoresis.  The patient did state that she had a recent URI with a productive cough  and wonders whether her chest pain may be related to that.  She was  admitted for rule out myocardial infarction and we were asked to further  evaluate.   CURRENT MEDICATIONS:  Her medications at present include  1. Aspirin 325 mg p.o. daily.  2. Norpace 150 mg p.o. b.i.d.  3. Zocor 40 mg p.o. daily.  4. Atacand 32 mg p.o. daily.  5. Toprol 200 mg p.o. daily.  6. Cardene 30 mg p.o. daily.  7. She is also on Protonix.   ALLERGIES:  She has no known drug allergies.   SOCIAL HISTORY:  She has  remote history of tobacco use but has not  smoked in 40 years.  She does not consume alcohol.   Her family history is negative for coronary artery disease in her  immediate family.   The past medical history is significant for hypertension and  hyperlipidemia.  She has a history of a subarachnoid hemorrhage.  She  has a history of a hypertrophic cardiomyopathy by report.  She also has  paroxysmal atrial fibrillation.  She has had a previous hysterectomy.   REVIEW OF SYSTEMS:  She denies any headaches.  She did have a productive  cough recently but there was no hemoptysis.  There was no dysphagia,  odynophagia, melena, or hematochezia.  There was no dysuria, hematuria.  There was no rash or seizure activity.  There was no orthopnea, PND or  pedal edema.  The remaining systems are negative.   PHYSICAL EXAMINATION:  VITAL SIGNS:  The patient's heart rate is 74.  GENERAL:  She is well-developed, well-nourished and in no acute  distress.  SKIN:  Warm and dry.  She does not appear to be depressed and there is  no peripheral clubbing.  BACK:  Normal.  HEENT:  Normal with normal eyelids.  NECK:  Supple with a normal upstroke bilaterally and there are no bruits  noted.  There is no jugular venous distention.  I cannot appreciate  thyromegaly.  CHEST:  Clear to auscultation, no expansion.  CARDIOVASCULAR:  Regular rate and rhythm with normal S1-S2.  There is a  2/6 systolic murmur at the left sternal border that does not change with  Valsalva.  There is 2/6 systolic murmur at the apex.  ABDOMEN:  Nontender, nondistended, positive bowel sounds, no  hepatosplenomegaly and no mass appreciated.  There is no abdominal  bruit.  She has 2+ femoral pulses bilaterally.  No bruits.  EXTREMITIES:  Show no edema and I could palpate no cords.  She has 2+  dorsalis pedis pulses bilaterally.  NEUROLOGICAL:  Grossly intact.   Electrocardiogram shows a normal sinus rhythm with left ventricular   hypertrophy and a right ventricular conduction delay.  There are  nonspecific ST changes.  The remaining laboratories are pending at the  time of this dictation.   DIAGNOSES:  1. Atypical chest pain - Monique [name] is complaining of chest pain.  She      is a difficult historian and her symptoms are somewhat nondescript.      Her electrocardiogram shows no significant ST changes.  We will      plan to repeat electrocardiogram tomorrow morning and cycle      enzymes.  If they are negative, then we will plan an outpatient      Myoview for risk stratification.  I agree with her aspirin, statin      and beta blockade.  We will not anticoagulate for now given her      history of subarachnoid hemorrhage.  2. History of hypertrophic cardiomyopathy - she will need an      outpatient echocardiogram.  3. History of paroxysmal atrial fibrillation - she remains in sinus      rhythm.  She will continue on Norpace.  She is not on Coumadin      given her history of subarachnoid bleed.  4. History of subarachnoid hemorrhage.  5. Hypertension - we will adjust her regimen as indicated.  6. Hyperlipidemia - she will continue with statin.   We will be happy to follow while she is in the hospital.      Madolyn Frieze. Jens Som, MD, [facility]  Electronically Signed     BSC/MEDQ  D:  05/20/2008  T:  05/21/2008  Job:  [id number]

## 2011-01-18 NOTE — Assessment & Plan Note (Signed)
[facility]                            CARDIOLOGY OFFICE NOTE   NAME:Monique [name], Monique [name]                     MRN:          [id number]  DATE:10/11/2007                            DOB:          1935-12-17    Monique [name] is a delightful lady who came to the [place], [place], office today instead of the [place], [place], office  to see Dr. Tenny Craw.   Dr. Tenny Craw saw her last May 28, 2007.   PROBLEM LIST:  1. Paroxysmal atrial fibrillation which is very intermittent and      fairly unusual.  She has had no symptoms of recurrent atrial      fibrillation.  2. Hypertrophic cardiomyopathy.  3. Probable diastolic dysfunction.  4. History of subarachnoid hemorrhage, hence, not a Coumadin      candidate.   Her biggest complaint was some dull aching in her chest that lasted for  about an hour off and on last week.  She denies any exertional angina or  dyspnea.  She has had no palpitations, presyncope or syncope.   CURRENT MEDICATIONS:  1. Cardene SR 60 mg a day.  2. Norpace 150 mg b.i.d.  3. Zocor 40 mg a day.  4. Atacand 32 mg a day.  5. Toprol 200 mg a day.  6. She also takes Cardene 30 mg at night.   PHYSICAL EXAMINATION:  GENERAL APPEARANCE:  She is very pleasant.  She  is alert and oriented x3.  VITAL SIGNS:  Blood pressure initially 164/96 in the left arm.  I  rechecked it and it was 138/88.  Her pulse was 74 and regular.  Weight  177.  She says her blood pressure is usually much better than this.  It  usually runs round 117/about 80.  Respiratory rate 18.  HEENT:  Normocephalic, atraumatic.  PERRL.  Extraocular is intact.  Sclerae are clear.  Face symmetry is normal.  NECK:  Carotids upstrokes were equal bilateral without bruits, no JVD.  Thyroid is not enlarged.  Trachea is midline.  LUNGS:  Clear.  HEART:  A poorly appreciated PMI.  She has a soft systolic murmur.  ABDOMEN:  Soft, obese.  EXTREMITIES:  No edema.  Pulses  are intact.  NEUROLOGIC:  Exam is intact.   Monique [name] is doing well at the present time.  I will schedule her for  follow-up with Dr. Tenny Craw in three months.  We have renewed all our  medications through [facility].     Thomas C. Daleen Squibb, MD, [facility]  Electronically Signed    TCW/MedQ  DD: 10/11/2007  DT: 10/12/2007  Job #: [id number]

## 2011-01-18 NOTE — Cardiovascular Report (Signed)
NAMEGENNETTE, SHADIX NO.:  [id number]   MEDICAL RECORD NO.:  [id number]          PATIENT TYPE:  INP   LOCATION:  2036                         FACILITY:  [facility]   PHYSICIAN:  Marca Ancona, MD      DATE OF BIRTH:  03-Mar-1936   DATE OF PROCEDURE:  DATE OF DISCHARGE:                            CARDIAC CATHETERIZATION   INDICATIONS:  Chest pain and hypertrophic cardiomyopathy.   PROCEDURES:  1. Left heart catheterization.  2. Coronary angiography.  3. Left ventriculography.   PROCEDURE NOTE:  After informed consent was obtained, the right groin  was sterilely prepped and draped.  1% lidocaine was used to topically  anesthetize the right groin.  The right common femoral artery was  entered using Seldinger technique.  A 6-French vascular sheath was  placed in the right common femoral artery.  The left coronary artery and  the right coronary artery both engaged with the 6-French multipurpose  catheter. The ventricle was entered using the 6-French multipurpose  catheter.  There were no complications.   HEMODYNAMICS:  LV 142/14/23 and aorta 146/82.   LEFT VENTRICULOGRAM:  EF was 60%.  There were no wall motion  abnormalities.  There was no gradient on pullback from the mid left  ventricle to the left ventricular outflow tract to the aorta signifying  no evidence for a LV outflow tract gradient.   FINDINGS:  Coronary angiography:  The coronary system is right dominant.  There is no significant coronary disease.   ASSESSMENT AND PLAN:  No coronary disease.  Normal left ventricular  ejection fraction with no outflow tract gradient.  The left ventricular  end-diastolic pressure was somewhat elevated at 23 mmHg.  We will plan  for medical management.      Marca Ancona, MD  Electronically Signed     DM/MEDQ  D:  05/21/2008  T:  05/22/2008  Job:  [id number]

## 2011-01-21 NOTE — Assessment & Plan Note (Signed)
[facility]                             PRIMARY CARE OFFICE NOTE   NAME:Monique [name], [name]                     MRN:          [id number]  DATE:04/11/2006                            DOB:          24-Mar-1936    Monique [name] is a delightful 75 year old African-American woman who presents  for general follow up evaluation and exam.  I last saw the patient September 02, 2003.  In the interval she has been followed by a cardiology service on  a regular basis for hypertrophic cardiomyopathy.  Patient has also had  hypertension that Dr. Dietrich Pates has been addressing.  She saw Dr. Tenny Craw on  September 09, 2005 for shortness of breath and intermittent atrial  fibrillation, hypertension and dyslipidemia.   The patient reports that she currently is feeling well, is very active and  has no limitations in her activities.   Chart reviewed, Holter monitor from December 2000 showed sinus rhythm with  no specific ectopy.  Last stress only Cardiolite study from August 15, 2001 showed no evidence of ischemia or infarction with an EF of 71%.  Last  hospitalization was in 2000.  Last 2D echo from June 29, 2004 revealed  moderate concentric LVH with hyperdynamic contractility, moderate  intracavitary gradient, grade 3 diastolic dysfunction, no significant MR.  The patient has had laboratories as recently as November 17, 2005 where her  cholesterol was 135 with an LDL 75, HDL was 42.6.  Liver functions were  normal.  Urinalysis was normal.  Last Pap smear on the chart dates from  September 18, 1998 and was negative with the patient being status post  hysterectomy.  Last mammogram from August 31, 2005 was negative.   PAST SURGICAL HISTORY:  Hysterectomy.   PAST MEDICAL HISTORY:  1.  Usual childhood diseases.  2.  Gravida 4, para 4.  3.  Hypertension.  4.  PAF.  5.  Hypertrophic cardiomyopathy.  6.  Subarachnoid hemorrhage in 2000.  7.  Urinary frequency secondary to small  bladder.  8.  Last colorectal screening was January of 2000 with a flex sig.   CURRENT MEDICATIONS:  1.  Cardene 60 mg q.p.m.  2.  Norpace 150 mg b.i.d.  3.  Zocor 40 mg q. day.  4.  Cardene 20 mg q.a.m.  5.  Atacand 32 mg q. day.  6.  Toprol 200 mg q. day.   REVIEW OF SYSTEMS:  Negative for cardiovascular, respiratory, GI, GU  complaints.   PHYSICAL EXAMINATION:  VITAL SIGNS:  Temperature was 98.6, blood pressure  123/70, pulse was 66, weight 169.  GENERAL:  Well-nourished woman looking younger than her stated chronologic  age.  No acute distress.  HEENT:  Normocephalic, atraumatic.  EACs and TMs were unremarkable.  Oropharynx revealed the patient to be missing several premolars and molars  upper and lower.  She had no buccal lesions.  Posterior pharynx was clear.  Conjunctivae and sclerae were clear.  PERRLA, EOMI.  Funduscopic exam was  unremarkable.  NECK:  Supple without thyromegaly.  NODES:  No adenopathy was noted in the cervical, supraclavicular  regions.  CHEST:  No CVA tenderness.  LUNGS:  Clear to auscultation and percussion.  CARDIOVASCULAR:  2+ radial pulse.  No JVD or carotid bruits.  She had a  quiet precordium with regular rate and rhythm without murmurs, rubs or  gallops.  BREASTS:  Skin was normal.  Nipples without discharge.  Palpation revealed  the patient to have very dense breasts but no palpable abnormalities were  noted.  No axillary adenopathy was noted.  ABDOMEN:  Soft, no guarding, no rebound, no organosplenomegaly was noted.  PELVIC:  Exam deferred secondary to hysterectomy.  RECTAL:  Exam deferred with upcoming colonoscopy.  EXTREMITIES:  Extremities without clubbing, cyanosis, edema or deformity.  NEUROLOGIC:  Exam was nonfocal.   LABORATORY DATA:  Laboratory ordered and pending includes a lipid panel,  AST, ALT, basic metabolic panel, TSH.   ASSESSMENT/PLAN:  1.  Hypertension.  The patient is well controlled on her present medical       regimen.  She will continue to the same.  2.  Cardiovascular.  The patient is followed up for hypertrophic      cardiomyopathy by Dr. Dietrich Pates and seems to be stable and current up-      to-date.  3.  Lipids.  The patient has repeat laboratory ordered and pending but her      last lipid panel from November 17, 2005 showed excellent control at goal      with an LDL of 75.  4.  Health maintenance.  The patient is current with mammography.  The      patient is scheduled for colonoscopy May 09, 2006 with Dr. Christella Hartigan      at 9:30 a.m. with a preop visit April 26, 2006.  The patient is aware      of this appointment.   SUMMARY:  This is a very pleasant patient who seems to be medically stable  at this time.  She will return for colonoscopy with Dr. Christella Hartigan as noted.  She will return to see me on a p.r.n. basis.                                   [name], MD   MEN/MedQ  DD:  04/11/2006  DT:  04/12/2006  Job #:  [id number]   cc:   Monique [name]

## 2011-01-21 NOTE — Assessment & Plan Note (Signed)
[facility]                            CARDIOLOGY OFFICE NOTE   NAME:[name], [name]                     MRN:          [id number]  DATE:12/18/2006                            DOB:          03/31/1936    IDENTIFICATION:  Monique [name] is a 74 year old woman with a history of  intermittent atrial fibrillation, hypertrophic cardiomyopathy, probable  diastolic dysfunction, history of subarachnoid hemorrhage.  She was last  seen in cardiology clinic, actually, back in January 2007.   In the interval, she has been followed in primary care by Dr. Debby [name].  Note she had complained of headaches recently and was set up for a head  CT and not heard back.   Otherwise, she denies any significant palpitations.  Note she had a  little chest pain yesterday.  She points to the epigastric region.  She  had had breakfast and lunch, had done working when she developed this  epigastric discomfort.  She laid down.  After about an hour the episode  eased.  She felt fine after.  Not associated with shortness of breath.   CURRENT MEDICATIONS:  1. Cardene 60 q.a.m., 30 q.p.m.  2. Norpace 150 b.i.d.  3. Zocor 40 daily.  4. Atacand 32 daily.  5. Toprol-XL 200 daily.   PHYSICAL EXAMINATION:  GENERAL:  The patient is in no distress.  VITAL SIGNS:  Blood pressure 137/81, pulse is 68, weight 170 down from  176.  LUNGS:  Clear, no rales.  CARDIAC:  Regular rate and rhythm.  S1, S2.  No S3.  ABDOMEN:  Benign.  EXTREMITIES:  No edema.   IMPRESSION:  1. Hypertension, adequate control.  Would continue.  2. Intermittent atrial fibrillation, does not appear to be      symptomatic.  Again, hold on aspirin given subarachnoid hemorrhage.  3. Hypertrophic cardiomyopathy.  Echocardiogram was done in 2005 that      showed diastolic dysfunction, moderate intracavity gradient.  Would      continue on medical therapy.  4. Headaches.  I have looked at the CT scan.  It shows chronic  bilateral subdural fluid collections in the frontal area.  It does      not compare it, though, to old CTs.  I have discussed with Monique [name], who will follow up.   I will set followup for the fall, sooner if problems develop.   ADDENDUM:  Dyslipidemia.  Last lipid panel in 2006.  Will need followup.     Pricilla Riffle, MD, [facility]  Electronically Signed    PVR/MedQ  DD: 12/18/2006  DT: 12/19/2006  Job #: [id number]

## 2011-01-21 NOTE — Discharge Summary (Signed)
[place]. [facility]  Patient:    Monique [name]                         MRN: [id number] Adm. Date:  [id number] Disc. Date: 07/16/99 Attending:  Rollene Rotunda Dictator:   Abelino Derrick, P.A.C. [facility] CC:         Rosalyn Gess. Norins, M.D. [facility] office             Reinaldo Meeker, M.D. office             Rollene Rotunda, M.D. [facility] office                  Referring Physician Discharge [facility]  ADMITTING PHYSICIAN:  Gordy Savers, M.D.  DISCHARGE DIAGNOSES: 1. Paroxysmal atrial fibrillation, Norpace added this admission after patient    converted spontaneously to sinus rhythm. 2. History of subarachnoid hemorrhage, July 2000, no Coumadin or aspirin at this    time. 3. 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 4. Hypertension.  HOSPITAL COURSE:  Patient is a 75 year old female followed by Dr. Debby Bud and Dr. Gerlene Fee.  She has a history of HOCM by echo in the office.  She has had a subarachnoid hemorrhage in July 2000, and a site could not be found, but she also had a separate aneurysm noted.  She is admitted July 11, 1999 with rapid atrial fibrillation.  We saw her in consult.  Echocardiogram was obtained. Echocardiogram here, on review, actually showed ASH with mild midcavity obstruction.  Overall LV function was normal.  Patient was put on Cardizem and Lopressor, and converted spontaneously to sinus rhythm.  It was discussed with Dr. Gerlene Fee whether or not the patient would be a candidate for aspirin, she was obviously not a candidate for Coumadin because of her recent bleed.  It was felt that aspirin should not be used at this time.  Patient was put on Norpace CR 150 b.i.d. after she converted spontaneously to sinus rhythm.  Dr. Antoine Poche feels she can be discharged July 16, 1999 after a loading dose of Norpace.  DISCHARGE MEDICATIONS: 1. Norpace CR 150 b.i.d. 2. Lopressor 75 mg b.i.d. 3. Atacand 32 mg  q.d.  LABORATORY:  The laboratory is not on the chart at the time of dictation, although her BMP was normal at the day of discharge.  Her EKG showed acute TC of 420.  Chest x-ray showed no evidence of acute process.  DISPOSITION:  Patient is discharged in stable condition.  FOLLOW-UP:  Will follow up in the office in two weeks.  Her labs will be reviewed today, and an addendum will be dictated regarding these. DD:  07/16/99 TD:  07/16/99 Job: [id number]

## 2011-01-21 NOTE — Assessment & Plan Note (Signed)
[facility]                           PRIMARY CARE OFFICE NOTE   NAME:JONESKarlye, [name]                     MRN:          [id number]  DATE:12/06/2006                            DOB:          1935-09-18    Monique [name] is a pleasant, 75 year old woman well known to the practice  followed for hyperdynamic ventricle with HOCM. She also has a history of  subarachnoid hemorrhage in 2000. Records for hospitalization for that  event are not available.   The patient was last seen April 11, 2006, please see that complete  dictation of her annual physical examination as complete history.   The patient presented today reporting she has had recurrent headaches  which she describes as a very sharp stabbing pain at the vertex, scalp,  and radiation to the back of her skull. She reports this happened last  about 2-3 weeks ago in March. The duration is less than 5 minutes. She  denies any associated symptoms except for some mild blurring of the  vision in her right eye. She has no diplopia, she had no nausea, no  vomiting. She had no paresthesias, she had no focal weakness. She said  the pain is very similar to the pain she had leading up to her  subarachnoid hemorrhage in July 2000. Of note, the patient does have a  long history of hypertension that has been well controlled.   Monique [name] also reports that she has had several episodes where she will  have substernal chest pressure discomfort while at work. She reports it  will last for up to 2 hours. She denies any shortness of breath or  diaphoresis with this but does have some radiation of discomfort to her  neck. Because this occurs at work, she has been unable to stop working  to see if her pain resolves with rest. She has had several episodes at  home as well although not as long in duration. Of note, the patient is  followed by Dr. Dietrich Pates on a regular basis for a known hyperdynamic  ventricle and HOCM. In  reviewing her chart, her last stress test was in  2002, her last echo was in 2005.   CURRENT MEDICATIONS:  1. Cardene 60 mg a.m., 30 mg p.m.  2. Norpace 150 mg b.i.d.  3. Zocor 40 mg daily.  4. Atacand 32 mg daily.  5. Toprol 200 mg daily.   REVIEW OF SYSTEMS:  The patient has had no fevers or chills. She has had  no visual problems except with the headache as noted. No palpitations  but does have the chest discomfort as noted. No respiratory problems. No  GI problems.   PHYSICAL EXAMINATION:  VITAL SIGNS:  Temperature was 98.1, blood  pressure 128/75, pulse 72, weight 172.  GENERAL:  This is a well-nourished woman looking younger than stated  chronologic age in no acute distress.  HEENT:  Normocephalic, atraumatic. No signs of raccoons eyes or battle  sign. The patient had no lesions on her scalp. She had no tenderness to  percussion over her frontal sinuses. Conjunctiva and sclera  was clear.  CHEST:  Clear.  CARDIOVASCULAR:  2+ radial pulse. She had a quiet precordium. She had a  regular rate and rhythm. I appreciated no murmurs, rubs or gallops.  NEUROLOGIC:  The patient is awake and alert. She is oriented to person,  place, time and context. Her speech is clear, her memory is good except  for the event surrounding her hospitalization in July 2007. Cranial  nerves II-XII are intact with normal facial symmetry and movement.  Extraocular muscles were intact. Pupils equal round and reactive to  light and accommodation. Funduscopic exam revealed normal disk margins,  no vascular abnormalities, no exudates, no hemorrhages. The patient had  normal motor strength. She had normal balance.   A 12-lead electrocardiogram  revealed the patient to have significantly  enlarged QRS deflection in 1, 3, AVL suggestive of left ventricular  hypertrophy with repolarization and this is unchanged from previous  EKGs.   ASSESSMENT/PLAN:  1. Headache. The patient's headache has been very transient  but      severe. Her neurologic exam is normal. I think it is only a remote      possibility this could represent a recurrent subarachnoid      hemorrhage.  Plan:  The patient will be scheduled for a CT scan of the brain to rule  out hemorrhage.  1. Cardiovascular. Patient with exertional chest pressure and      discomfort. She does have known hypertrophic obstructive      cardiomyopathy. She last saw Dr. Dietrich Pates in January 2007. The      patient's last 2-D echo was in 2005 as noted. Her examination is      unremarkable and not alarming. Plan:  The patient is to continue on      her present medications. I will provide her with a prescription for      sublingual nitroglycerin to take on occasions of chest pain or      discomfort. I will schedule her to see Dr. Dietrich Pates on the first      available appointment.     Monique Gess Norins, MD  Electronically Signed    MEN/MedQ  DD: 12/06/2006  DT: 12/06/2006  Job #: [id number]   cc:   Pricilla Riffle, MD, [facility]

## 2011-02-11 ENCOUNTER — Other Ambulatory Visit: Payer: Self-pay

## 2011-02-15 ENCOUNTER — Telehealth: Payer: Self-pay | Admitting: Internal Medicine

## 2011-02-15 MED ORDER — LOSARTAN POTASSIUM 100 MG PO TABS: 100.0000 mg | ORAL_TABLET | Freq: Every day | ORAL | Status: DC

## 2011-02-15 NOTE — Telephone Encounter (Signed)
Generic kozar 100 mg uses [facility]

## 2011-02-16 ENCOUNTER — Other Ambulatory Visit: Payer: Self-pay | Admitting: *Deleted

## 2011-02-16 MED ORDER — LOSARTAN POTASSIUM 100 MG PO TABS: 100.0000 mg | ORAL_TABLET | Freq: Every day | ORAL | Status: DC

## 2011-06-06 LAB — CARDIAC PANEL(CRET KIN+CKTOT+MB+TROPI)
CK, MB: 6.6 — ABNORMAL HIGH
Relative Index: 4.7 — ABNORMAL HIGH
Relative Index: 5.1 — ABNORMAL HIGH
Relative Index: 5.1 — ABNORMAL HIGH
Total CK: 132
Troponin I: 0.03
Troponin I: 0.03

## 2011-06-06 LAB — COMPREHENSIVE METABOLIC PANEL
ALT: 15
Albumin: 3.9
Alkaline Phosphatase: 74
Calcium: 9.8
GFR calc Af Amer: 47 — ABNORMAL LOW
Potassium: 4
Sodium: 143
Total Protein: 6.6

## 2011-06-06 LAB — CBC
HCT: 41.6
MCHC: 33.7
MCV: 95.2
Platelets: 151
RBC: 4.37
RDW: 13.4
WBC: 10.9 — ABNORMAL HIGH

## 2011-06-06 LAB — BASIC METABOLIC PANEL
BUN: 13
BUN: 14
CO2: 26
CO2: 26
Calcium: 8.7
Calcium: 9.1
Chloride: 107
Creatinine, Ser: 1.06
Creatinine, Ser: 1.2
GFR calc Af Amer: >60
GFR calc non Af Amer: 44 — ABNORMAL LOW
Glucose, Bld: 94
Sodium: 138

## 2011-06-06 LAB — LIPID PANEL
Cholesterol: 130
HDL: 45
Triglycerides: 71

## 2011-06-06 LAB — PROTIME-INR
INR: 1
Prothrombin Time: 12.8

## 2011-06-15 ENCOUNTER — Other Ambulatory Visit: Payer: Self-pay | Admitting: Internal Medicine

## 2011-08-11 ENCOUNTER — Encounter: Payer: Self-pay | Admitting: *Deleted

## 2011-08-12 ENCOUNTER — Encounter: Payer: Self-pay | Admitting: Physician Assistant

## 2011-08-12 ENCOUNTER — Ambulatory Visit (INDEPENDENT_AMBULATORY_CARE_PROVIDER_SITE_OTHER): Payer: 59 | Admitting: Physician Assistant

## 2011-08-12 DIAGNOSIS — I10 Essential (primary) hypertension: Secondary | ICD-10-CM

## 2011-08-12 DIAGNOSIS — R079 Chest pain, unspecified: Secondary | ICD-10-CM

## 2011-08-12 DIAGNOSIS — I4891 Unspecified atrial fibrillation: Secondary | ICD-10-CM

## 2011-08-12 LAB — HEPATIC FUNCTION PANEL
Alkaline Phosphatase: 81 U/L (ref 39–117)
Bilirubin, Direct: 0.1 mg/dL (ref 0.0–0.3)
Total Bilirubin: 1 mg/dL (ref 0.3–1.2)
Total Protein: 7.3 g/dL (ref 6.0–8.3)

## 2011-08-12 LAB — LIPID PANEL
HDL: 59.8 mg/dL (ref >39.00)
Total CHOL/HDL Ratio: 3
Triglycerides: 109 mg/dL (ref 0.0–149.0)
VLDL: 21.8 mg/dL (ref 0.0–40.0)

## 2011-08-12 MED ORDER — OMEPRAZOLE 20 MG PO CPDR: 20.0000 mg | DELAYED_RELEASE_CAPSULE | Freq: Every day | ORAL | Status: DC

## 2011-08-12 MED ORDER — NIFEDIPINE ER OSMOTIC RELEASE 60 MG PO TB24: 60.0000 mg | ORAL_TABLET | Freq: Every day | ORAL | Status: DC

## 2011-08-12 NOTE — Assessment & Plan Note (Signed)
Her symptoms are consistent with acid reflux.  Start Prilosec 20 mg daily.  I have advised her to followup with her PCP if her symptoms are not completely resolved.  If Prilosec does not improve her symptoms at all, she should contact our office.  At that point I would set her up for a stress test.  However, she had normal coronary arteries 3 years ago and her symptoms are currently atypical.

## 2011-08-12 NOTE — Progress Notes (Signed)
  [address] [place], [place]  [address] Phone: [phone] Fax:  [phone]  Date:  08/12/2011   Name:  Monique [name]       DOB:  1936/07/01 MRN:  [id number]  PCP:  Dr. Debby Bud Primary Cardiologist:  Dr. Dietrich Pates    History of Present Illness: Monique [name] is a 75 y.o. female who presents for follow up.  She has a history of hypertrophic cardiomyopathy, hypertension, hyperlipidemia, paroxysmal atrial fibrillation and prior subarachnoid hemorrhage.  She is not on Coumadin secondary to prior subarachnoid hemorrhage.  Nuclear study 12/02: No scar or ischemia, EF 71%.  LHC 9/09: EF 60%, no CAD, no LVOT gradient.  Echocardiogram 10/05: Moderate LVH, moderate intracavitary gradient, grade 3 diastolic dysfunction, no MR.  She was last seen by Dr. Tenny Craw on 12/11.  She presents for routine followup.  She has noted some atypical chest pains over the last few months.  It seems to be associated with meals.  Belching makes it better.  She denies exertional chest pain or shortness of breath.  She denies orthopnea, PND or edema.  She denies significant palpitations.  She denies syncope.  Her chest pain is not associated with shortness of breath, radiating symptoms, nausea or diaphoresis.  She denies dysphagia.  She denies odynophagia.  She denies weight loss, melena, hematochezia, hematemesis.  Past Medical History  Diagnosis Date  . PAF (paroxysmal atrial fibrillation)   . LVH (left ventricular hypertrophy)   . Subarachnoid hemorrhage   . Hyperlipidemia   . HTN (hypertension)     Essential    Current Outpatient Prescriptions  Medication Sig Dispense Refill  . disopyramide (NORPACE) 150 MG capsule TAKE 1 TABLET BY MOUTH TWO TIMES A DAY  60 capsule  6  . losartan (COZAAR) 100 MG tablet Take 1 tablet (100 mg total) by mouth daily.  30 tablet  6  . metoprolol (TOPROL XL) 100 MG 24 hr tablet Take 1 tablet (100 mg total) by mouth 2 (two) times daily.  60 tablet  11    . NIFEdipine (PROCARDIA XL/ADALAT-CC) 30 MG 24 hr tablet Take 1 tablet (30 mg total) by mouth daily.  30 tablet  11  . simvastatin (ZOCOR) 40 MG tablet Take 20 mg by mouth at bedtime.          Allergies: No Known Allergies  History  Substance Use Topics  . Smoking status: Never Smoker   . Smokeless tobacco: Not on file  . Alcohol Use: Not on file     ROS:  Please see the history of present illness.   All other systems reviewed and negative.   PHYSICAL EXAM: VS:  BP 146/84  Pulse 59  Resp 16  Ht 5\' 8"  (1.727 m)  Wt 167 lb (75.751 kg)  BMI 25.39 kg/m2 Repeat blood pressure by me 140/90 Well nourished, well developed, in no acute distress HEENT: normal Neck: no JVD Vascular: No carotid bruits Cardiac:  normal S1, S2; RRR; no murmur Lungs:  clear to auscultation bilaterally, no wheezing, rhonchi or rales Abd: soft, nontender, no hepatomegaly Ext: no edema Skin: warm and dry Neuro:  CNs 2-12 intact, no focal abnormalities noted  EKG:   Sinus rhythm, heart rate 54, left axis deviation, LVH, nonspecific ST-T changes, no significant change compared to prior tracings.  ASSESSMENT AND PLAN:

## 2011-08-12 NOTE — Assessment & Plan Note (Signed)
She is fasting today.  Check lipids and LFTs.Labs one year ago were optimal.

## 2011-08-12 NOTE — Patient Instructions (Addendum)
  Your physician has recommended you make the following change in your medication: INCREASE NIFEDIPINE TO 60 MG DAILY; START PRILOSEC 20 MG DAILY  Your physician recommends that you return for lab work in: TODAY FASTING LIPID/LIVER PANEL 401.1 HTN  PLEASE MAKE A NURSE VISIT APPT TO BE DONE IN 3 WEEKS FOR A BLOOD PRESSURE CHECK DUE TO NIFEDIPINE INCREASED TO 60 MG DAILY  Your physician wants you to follow-up in: 6 MONTHS WITH DR. Tenny Craw. You will receive a reminder letter in the mail two months in advance. If you don't receive a letter, please call our office to schedule the follow-up appointment.

## 2011-08-12 NOTE — Assessment & Plan Note (Signed)
Maintaining sinus rhythm.  No recurrence by her symptoms.  She is not on Coumadin due to prior history of subarachnoid hemorrhage.  Followup with Dr. Tenny Craw in 6 months.

## 2011-08-12 NOTE — Assessment & Plan Note (Signed)
Blood pressure uncontrolled.  She tends to obtain fairly high numbers at home.  Increase nifedipine to 60 mg daily.  I will have her return in 2-3 weeks to see the nurse for blood pressure check.

## 2011-09-14 ENCOUNTER — Telehealth: Payer: Self-pay | Admitting: *Deleted

## 2011-09-14 ENCOUNTER — Telehealth: Payer: Self-pay | Admitting: Internal Medicine

## 2011-09-14 NOTE — Telephone Encounter (Signed)
Pt. Called & left msg requesting an appt for next week with Dr. Debby Bud

## 2011-09-14 NOTE — Telephone Encounter (Signed)
Pt. Called and left msg requesting an appt with Dr. Debby Bud next week. Please call and schedule.

## 2011-09-23 ENCOUNTER — Other Ambulatory Visit: Payer: Self-pay | Admitting: *Deleted

## 2011-09-23 MED ORDER — LOSARTAN POTASSIUM 100 MG PO TABS: 100.0000 mg | ORAL_TABLET | Freq: Every day | ORAL | Status: DC

## 2011-09-26 ENCOUNTER — Other Ambulatory Visit (INDEPENDENT_AMBULATORY_CARE_PROVIDER_SITE_OTHER): Payer: 59

## 2011-09-26 ENCOUNTER — Ambulatory Visit (INDEPENDENT_AMBULATORY_CARE_PROVIDER_SITE_OTHER): Payer: 59 | Admitting: Internal Medicine

## 2011-09-26 ENCOUNTER — Encounter: Payer: Self-pay | Admitting: Internal Medicine

## 2011-09-26 DIAGNOSIS — R319 Hematuria, unspecified: Secondary | ICD-10-CM

## 2011-09-26 DIAGNOSIS — N390 Urinary tract infection, site not specified: Secondary | ICD-10-CM

## 2011-09-26 LAB — URINALYSIS, ROUTINE W REFLEX MICROSCOPIC
Ketones, ur: NEGATIVE
Specific Gravity, Urine: >=1.03 (ref 1.000–1.030)
Total Protein, Urine: NEGATIVE
Urine Glucose: NEGATIVE

## 2011-09-26 MED ORDER — SULFAMETHOXAZOLE-TMP DS 800-160 MG PO TABS: 1.0000 | ORAL_TABLET | Freq: Two times a day (BID) | ORAL | Status: AC

## 2011-09-26 NOTE — Progress Notes (Signed)
  Subjective:    Patient ID: Monique [name], female    DOB: 09-03-1936, 77 y.o.   MRN: [id number]  HPI Monique [name] presents for a two week history of fullness and pressure in the suprapubic region, has urge to go but will have difficulty initiating stream. She has had some pain at the left side rated 7/10. She has had hematuria x 2. She denies fever, chills, rigors, N/V, weight loss.  Past Medical History  Diagnosis Date  . PAF (paroxysmal atrial fibrillation)   . LVH (left ventricular hypertrophy)   . Subarachnoid hemorrhage   . Hyperlipidemia   . HTN (hypertension)     Essential   Past Surgical History  Procedure Date  . Hysterectomy    No family history on file. History   Social History  . Marital Status: Single    Spouse Name: N/A    Number of Children: N/A  . Years of Education: N/A   Occupational History  . Not on file.   Social History Main Topics  . Smoking status: Never Smoker   . Smokeless tobacco: Not on file  . Alcohol Use: Not on file  . Drug Use: Not on file  . Sexually Active: Not on file   Other Topics Concern  . Not on file   Social History Narrative  . No narrative on file       Review of Systems System review is negative for any constitutional, cardiac, pulmonary, GI or neuro symptoms or complaints other than as described in the HPI.     Objective:   Physical Exam Filed Vitals:   09/26/11 1317  BP: 120/80  Pulse: 80  Temp: 96.9 F (36.1 C)  Resp: 16   Gen'l- WNWD AA woman in no distress HEENT- C&S clear Pulm - normal respirations Cor - RRR Abd - tender in the suprapubic region, no guarding or rebound Neuro A&O x 3       Assessment & Plan:  Hematuria - possible bladder infection but no fever, chills. Possible uretheral stone or bladder pathology, but doubt kidney stone.  Plan - empiric treatment with Septra DS bid x 5 days            U/A with micro - if no bacteria or pyuria will refer to urology for work-up.   Addendum:  U/A 15-20 WBC, moderate LE, small amount bacteria Plan - finish antibiotics           For persistent symptoms or hematuria - further w/u

## 2011-09-26 NOTE — Patient Instructions (Signed)
Symptoms suggest bladder infection except for lack of fever. Will treat you with antibiotics, Septra DS twice a day for 5 days. Will check a urinalysis - if there is no sign of infection will need you to see a urologist for evaluation of the bladder to rule out any stone or blockage of the stem of the bladder.

## 2011-09-27 ENCOUNTER — Encounter: Payer: Self-pay | Admitting: Internal Medicine

## 2011-09-28 ENCOUNTER — Other Ambulatory Visit: Payer: Self-pay

## 2011-09-28 MED ORDER — LOSARTAN POTASSIUM 100 MG PO TABS: 100.0000 mg | ORAL_TABLET | Freq: Every day | ORAL | Status: DC

## 2011-11-15 ENCOUNTER — Other Ambulatory Visit: Payer: Self-pay

## 2011-11-15 MED ORDER — SIMVASTATIN 40 MG PO TABS: 20.0000 mg | ORAL_TABLET | Freq: Every day | ORAL | Status: DC

## 2011-11-15 NOTE — Telephone Encounter (Signed)
..   Requested Prescriptions   Signed Prescriptions Disp Refills  . simvastatin (ZOCOR) 40 MG tablet 30 tablet 11    Sig: Take 0.5 tablets (20 mg total) by mouth at bedtime.    Authorizing Provider: Pricilla Riffle    Ordering User: Lacie Scotts

## 2012-01-20 ENCOUNTER — Other Ambulatory Visit: Payer: Self-pay | Admitting: Internal Medicine

## 2012-01-20 ENCOUNTER — Other Ambulatory Visit: Payer: Self-pay | Admitting: Cardiology

## 2012-04-11 ENCOUNTER — Other Ambulatory Visit: Payer: Self-pay | Admitting: Internal Medicine

## 2012-05-15 ENCOUNTER — Ambulatory Visit (INDEPENDENT_AMBULATORY_CARE_PROVIDER_SITE_OTHER): Payer: 59 | Admitting: Internal Medicine

## 2012-05-15 VITALS — BP 158/70 | HR 71 | Temp 97.1°F | Resp 18 | Wt 162.8 lb

## 2012-05-15 DIAGNOSIS — Z23 Encounter for immunization: Secondary | ICD-10-CM

## 2012-05-15 DIAGNOSIS — K2990 Gastroduodenitis, unspecified, without bleeding: Secondary | ICD-10-CM

## 2012-05-15 DIAGNOSIS — I10 Essential (primary) hypertension: Secondary | ICD-10-CM

## 2012-05-15 DIAGNOSIS — K2970 Gastritis, unspecified, without bleeding: Secondary | ICD-10-CM

## 2012-05-15 NOTE — Patient Instructions (Addendum)
Stomach pain - a flare of gastritis. No sign or history to suggest any bleeding or any pancreatitis.  Plan for 14 days take prilosec 40 mg , two capsules, every morning, then resume one a day.  For any increased pain or problems - call.  Will give pneumonia vaccine today - once a done for life. Will give annual flu shot today.  Gastritis Gastritis is an inflammation (the body's way of reacting to injury and/or infection) of the stomach. It is often caused by viral or bacterial (germ) infections. It can also be caused by chemicals (including alcohol) and medications. This illness may be associated with generalized malaise (feeling tired, not well), cramps, and fever. The illness may last 2 to 7 days. If symptoms of gastritis continue, gastroscopy (looking into the stomach with a telescope-like instrument), biopsy (taking tissue samples), and/or blood tests may be necessary to determine the cause. Antibiotics will not affect the illness unless there is a bacterial infection present. One common bacterial cause of gastritis is an organism known as H. Pylori. This can be treated with antibiotics. Other forms of gastritis are caused by too much acid in the stomach. They can be treated with medications such as H2 blockers and antacids. Home treatment is usually all that is needed. Young children will quickly become dehydrated (loss of body fluids) if vomiting and diarrhea are both present. Medications may be given to control nausea. Medications are usually not given for diarrhea unless especially bothersome. Some medications slow the removal of the virus from the gastrointestinal tract. This slows down the healing process. HOME CARE INSTRUCTIONS Home care instructions for nausea and vomiting:  For adults: drink small amounts of fluids often. Drink at least 2 quarts a day. Take sips frequently. Do not drink large amounts of fluid at one time. This may worsen the nausea.   Only take over-the-counter or  prescription medicines for pain, discomfort, or fever as directed by your caregiver.   Drink clear liquids only. Those are anything you can see through such as water, broth, or soft drinks.   Once you are keeping clear liquids down, you may start full liquids, soups, juices, and ice cream or sherbet. Slowly add bland (plain, not spicy) foods to your diet.  Home care instructions for diarrhea:  Diarrhea can be caused by bacterial infections or a virus. Your condition should improve with time, rest, fluids, and/or anti-diarrheal medication.   Until your diarrhea is under control, you should drink clear liquids often in small amounts. Clear liquids include: water, broth, jell-o water and weak tea.  Avoid:  Milk.   Fruits.   Tobacco.   Alcohol.   Extremely hot or cold fluids.   Too much intake of anything at one time.  When your diarrhea stops you may add the following foods, which help the stool to become more formed:  Rice.   Bananas.   Apples without skin.   Dry toast.  Once these foods are tolerated you may add low-fat yogurt and low-fat cottage cheese. They will help to restore the normal bacterial balance in your bowel. Wash your hands well to avoid spreading bacteria (germ) or virus. SEEK IMMEDIATE MEDICAL CARE IF:    You are unable to keep fluids down.   Vomiting or diarrhea become persistent (constant).   Abdominal pain develops, increases, or localizes. (Right sided pain can be appendicitis. Left sided pain in adults can be diverticulitis.)   You develop a fever (an oral temperature above 102 F (38.9 C)).  Diarrhea becomes excessive or contains blood or mucus.   You have excessive weakness, dizziness, fainting or extreme thirst.   You are not improving or you are getting worse.   You have any other questions or concerns.  Document Released: 08/16/2001 Document Revised: 08/11/2011 Document Reviewed: 08/22/2005 [facility] Patient Information 2012 [place],  [place].

## 2012-05-15 NOTE — Progress Notes (Signed)
  Subjective:    Patient ID: Monique [name], female    DOB: [birth year], 76 y.o.   MRN: [id number]  HPI Monique [name] presents for evaluation of a week long h/o epigastric pain that was pretty bad but is now almost gone. She has had no N/V, no radiation of pain to the back, no increased discomfort with eating, no change in bowel habit, no change in stool color. She has been taking prilosec 20 mg daily.  Past Medical History  Diagnosis Date  . PAF (paroxysmal atrial fibrillation)   . LVH (left ventricular hypertrophy)   . Subarachnoid hemorrhage   . Hyperlipidemia   . HTN (hypertension)     Essential   Past Surgical History  Procedure Date  . Hysterectomy    No family history on file. History   Social History  . Marital Status: Single    Spouse Name: N/A    Number of Children: N/A  . Years of Education: N/A   Occupational History  . Not on file.   Social History Main Topics  . Smoking status: Never Smoker   . Smokeless tobacco: Never Used  . Alcohol Use: No  . Drug Use: No  . Sexually Active: Not Currently   Other Topics Concern  . Not on file   Social History Narrative      HSG. Married '61 - '88/divorced. 3 sons - '74, '59, '62, 1 daughter '63; 5 grandchildren. Lives alone.     Current Outpatient Prescriptions on File Prior to Visit  Medication Sig Dispense Refill  . disopyramide (NORPACE) 150 MG capsule TAKE 1 TABLET BY MOUTH TWO TIMES A DAY  60 capsule  6  . losartan (COZAAR) 100 MG tablet Take 1 tablet (100 mg total) by mouth daily.  30 tablet  6  . losartan (COZAAR) 100 MG tablet TAKE 1 TABLET (100 MG TOTAL) BY MOUTH DAILY.  30 tablet  6  . metoprolol succinate (TOPROL-XL) 100 MG 24 hr tablet TAKE 1 TABLET BY MOUTH TWICE A DAY  60 tablet  6  . NIFEdipine (PROCARDIA XL/ADALAT-CC) 60 MG 24 hr tablet Take 1 tablet (60 mg total) by mouth daily.  30 tablet  11  . omeprazole (PRILOSEC) 20 MG capsule Take 1 capsule (20 mg total) by mouth daily.  30 capsule  11  .  simvastatin (ZOCOR) 40 MG tablet Take 0.5 tablets (20 mg total) by mouth at bedtime.  30 tablet  11      Review of Systems System review is negative for any constitutional, cardiac, pulmonary, GI or neuro symptoms or complaints other than as described in the HPI.     Objective:   Physical Exam Filed Vitals:   05/15/12 1505  BP: 158/70  Pulse: 71  Temp: 97.1 F (36.2 C)  Resp: 18   gen'l- WNWD AA woman  HEENT C&S clear Cor- RRR Pulm - normal respirations Abd - BS+ x 4, no guarding or rebound, no HSM, tender to deep palpation epigastrium w/o radiation to the back        Assessment & Plan:

## 2012-05-16 DIAGNOSIS — K2970 Gastritis, unspecified, without bleeding: Secondary | ICD-10-CM

## 2012-05-16 HISTORY — DX: Gastritis, unspecified, without bleeding: K29.70

## 2012-05-16 NOTE — Assessment & Plan Note (Signed)
BP Readings from Last 3 Encounters:  05/15/12 158/70  09/26/11 120/80  08/12/11 146/84   Generally adequately controlled on present meds.

## 2012-05-16 NOTE — Assessment & Plan Note (Signed)
Patient with chronic dyspepsia on low dose prilosec, now with increased abdominal pain but signs of bleeding.  Plan Increased prilosec to 40 mg

## 2012-08-15 ENCOUNTER — Other Ambulatory Visit: Payer: Self-pay | Admitting: Physician Assistant

## 2012-09-14 ENCOUNTER — Other Ambulatory Visit: Payer: Self-pay | Admitting: Physician Assistant

## 2012-09-18 ENCOUNTER — Ambulatory Visit (INDEPENDENT_AMBULATORY_CARE_PROVIDER_SITE_OTHER): Payer: 59 | Admitting: Family Medicine

## 2012-09-18 VITALS — BP 101/66 | HR 74 | Temp 97.6°F | Resp 16 | Ht 65.5 in | Wt 160.0 lb

## 2012-09-18 DIAGNOSIS — I951 Orthostatic hypotension: Secondary | ICD-10-CM

## 2012-09-18 DIAGNOSIS — R42 Dizziness and giddiness: Secondary | ICD-10-CM

## 2012-09-18 DIAGNOSIS — R29898 Other symptoms and signs involving the musculoskeletal system: Secondary | ICD-10-CM

## 2012-09-18 DIAGNOSIS — N390 Urinary tract infection, site not specified: Secondary | ICD-10-CM

## 2012-09-18 LAB — POCT CBC
Granulocyte percent: 53.1 % (ref 37–80)
HCT, POC: 44.6 % (ref 37.7–47.9)
Hemoglobin: 13.9 g/dL (ref 12.2–16.2)
Lymph, poc: 4.7 — AB (ref 0.6–3.4)
MCH, POC: 30.6 pg (ref 27–31.2)
MCHC: 31.2 g/dL — AB (ref 31.8–35.4)
MCV: 98.2 fL — AB (ref 80–97)
MID (cbc): 0.5 (ref 0–0.9)
MPV: 9.2 fL (ref 0–99.8)
POC Granulocyte: 5.9 (ref 2–6.9)
POC LYMPH PERCENT: 42 % (ref 10–50)
POC MID %: 4.9 %M (ref 0–12)
Platelet Count, POC: 225 10*3/uL (ref 142–424)
RBC: 4.54 M/uL (ref 4.04–5.48)
RDW, POC: 12.6 %
WBC: 11.1 10*3/uL — AB (ref 4.6–10.2)

## 2012-09-18 LAB — POCT URINALYSIS DIPSTICK
Bilirubin, UA: NEGATIVE
Glucose, UA: NEGATIVE
Ketones, UA: NEGATIVE
Nitrite, UA: NEGATIVE
Protein, UA: 30
Spec Grav, UA: 1.02
Urobilinogen, UA: 0.2
pH, UA: 5.5

## 2012-09-18 LAB — POCT UA - MICROSCOPIC ONLY
Casts, Ur, LPF, POC: NEGATIVE
Crystals, Ur, HPF, POC: NEGATIVE
Mucus, UA: NEGATIVE
Yeast, UA: NEGATIVE

## 2012-09-18 MED ORDER — NITROFURANTOIN MONOHYD MACRO 100 MG PO CAPS: 100.0000 mg | ORAL_CAPSULE | Freq: Two times a day (BID) | ORAL | Status: DC

## 2012-09-18 NOTE — Progress Notes (Signed)
[facility]:  Office Visit  Chief Complaint:  Chief Complaint  Patient presents with  . Dizziness    Had a dizzy spell Sat and today and was unable to walk straight both times    HPI: Monique [name] is a 77 y.o. female who complains of  Dizzines starting 4 days ago when she woke up and sat up from bed, lasted 10 secondas. Then got stomach bug and was unable to eat for most of Saturday. Appetite has returned. Dizzy today again while wiping the sink in bathroom. Felt like she was going to fall. Lasted <30 minutes. + weakness in bilateral legs. Going away now. No recent vaccines, last vaccines in September for PNA and Flu. Denies CP, vision changes, SOB, nausea, fevers, chills. Compliant with BP meds. Denies any confusion or gait changes.  Has had urine frequency, + abd pain, H/o GERD, HTN, CAD, subarachnoid bleed   Past Medical History  Diagnosis Date  . PAF (paroxysmal atrial fibrillation)   . LVH (left ventricular hypertrophy)   . Subarachnoid hemorrhage   . Hyperlipidemia   . HTN (hypertension)     Essential   Past Surgical History  Procedure Date  . Hysterectomy    History   Social History  . Marital Status: Single    Spouse Name: N/A    Number of Children: N/A  . Years of Education: N/A   Social History Main Topics  . Smoking status: Former Games developer  . Smokeless tobacco: Never Used  . Alcohol Use: No  . Drug Use: No  . Sexually Active: Not Currently   Other Topics Concern  . None   Social History Narrative      HSG. Married '61 - '88/divorced. 3 sons - '74, '59, '62, 1 daughter '63; 5 grandchildren. Lives alone.    No family history on file. No Known Allergies Prior to Admission medications   Medication Sig Start Date End Date Taking? Authorizing Provider  disopyramide (NORPACE) 150 MG capsule TAKE 1 TABLET BY MOUTH TWO TIMES A DAY 01/20/12  Yes Pricilla Riffle, MD  losartan (COZAAR) 100 MG tablet Take 1 tablet (100 mg total) by mouth daily.  09/28/11  Yes Pricilla Riffle, MD  metoprolol succinate (TOPROL-XL) 100 MG 24 hr tablet TAKE 1 TABLET BY MOUTH TWICE A DAY 01/20/12  Yes Gaylord Shih, MD  NIFEdipine (PROCARDIA XL/ADALAT-CC) 60 MG 24 hr tablet TAKE 1 TABLET (60 MG TOTAL) BY MOUTH DAILY. 08/15/12  Yes Beatrice Lecher, PA  omeprazole (PRILOSEC) 20 MG capsule Take 1 capsule (20 mg total) by mouth daily. 08/12/11 10/18/12 Yes Beatrice Lecher, PA  simvastatin (ZOCOR) 40 MG tablet Take 0.5 tablets (20 mg total) by mouth at bedtime. 11/15/11  Yes Pricilla Riffle, MD     ROS: The patient denies fevers, chills, night sweats, unintentional weight loss, chest pain, palpitations, wheezing, dyspnea on exertion, nausea, vomiting,  hematuria, melena, numbness, weakness, or tingling.   All other systems have been reviewed and were otherwise negative with the exception of those mentioned in the HPI and as above.    PHYSICAL EXAM: Filed Vitals:   09/18/12 1641  BP: 101/66  Pulse: 74  Temp:   Resp:    Filed Vitals:   09/18/12 1606  Height: 5' 5.5" (1.664 m)  Weight: 160 lb (72.576 kg)   Body mass index is 26.22 kg/(m^2).  General: Alert, no acute distress HEENT:  Normocephalic, atraumatic, oropharynx patent. Tm nl.  Cardiovascular:  Regular  rate and rhythm, no rubs, + systolic  Murmurs, no  gallops.  No Carotid bruits, radial pulse intact. No pedal edema.  Respiratory: Clear to auscultation bilaterally.  No wheezes, rales, or rhonchi.  No cyanosis, no use of accessory musculature GI: No organomegaly, abdomen is soft and non-tender, positive bowel sounds.  No masses. Skin: No rashes. Neurologic: Facial musculature symmetric. Psychiatric: Patient is appropriate throughout our interaction. Lymphatic: No cervical lymphadenopathy Musculoskeletal: Gait intact. No CVA tenerness   LABS: Results for orders placed in visit on 09/18/12  POCT CBC      Component Value Range   WBC 11.1 (*) 4.6 - 10.2 K/uL   Lymph, poc 4.7 (*) 0.6 - 3.4   POC LYMPH  PERCENT 42.0  10 - 50 %L   MID (cbc) 0.5  0 - 0.9   POC MID % 4.9  0 - 12 %M   POC Granulocyte 5.9  2 - 6.9   Granulocyte percent 53.1  37 - 80 %G   RBC 4.54  4.04 - 5.48 M/uL   Hemoglobin 13.9  12.2 - 16.2 g/dL   HCT, POC 40.9  81.1 - 47.9 %   MCV 98.2 (*) 80 - 97 fL   MCH, POC 30.6  27 - 31.2 pg   MCHC 31.2 (*) 31.8 - 35.4 g/dL   RDW, POC 91.4     Platelet Count, POC 225  142 - 424 K/uL   MPV 9.2  0 - 99.8 fL  POCT URINALYSIS DIPSTICK      Component Value Range   Color, UA yellow     Clarity, UA hazy     Glucose, UA neg     Bilirubin, UA neg     Ketones, UA neg     Spec Grav, UA 1.020     Blood, UA small     pH, UA 5.5     Protein, UA 30     Urobilinogen, UA 0.2     Nitrite, UA neg     Leukocytes, UA moderate (2+)    POCT UA - MICROSCOPIC ONLY      Component Value Range   WBC, Ur, HPF, POC tntc     RBC, urine, microscopic 0-1     Bacteria, U Microscopic 2+     Mucus, UA neg     Epithelial cells, urine per micros 5-10     Crystals, Ur, HPF, POC neg     Casts, Ur, LPF, POC neg     Yeast, UA neg       EKG/XRAY:   Primary read interpreted by Dr. Conley Rolls at [facility].   ASSESSMENT/PLAN: Encounter Diagnoses  Name Primary?  . Dizziness Yes  . Leg weakness, bilateral   . UTI (lower urinary tract infection)     DR. Debby Bud (PCP) DR. Tenny Craw (CARDIOLOGIST) RX MACROBID FOR UTI URINE CX PENDING DC METOPROLOL IN PM DUE TO DIZZINESS, TAKE BP READINGS IN AM AND PM and KEEP BP LOGS, F/U WITH DR. ROSS WITH BP READINGS TO SEE IF HE NEEDS TO ADJUST HER BP MEDS F/U PRN   Vonya Ohalloran PHUONG, DO 09/18/2012 6:47 PM

## 2012-09-19 LAB — COMPREHENSIVE METABOLIC PANEL WITH GFR
CO2: 27 meq/L (ref 19–32)
Creat: 1.58 mg/dL — ABNORMAL HIGH (ref 0.50–1.10)
Glucose, Bld: 115 mg/dL — ABNORMAL HIGH (ref 70–99)
Total Bilirubin: 0.8 mg/dL (ref 0.3–1.2)

## 2012-09-19 LAB — COMPREHENSIVE METABOLIC PANEL
ALT: <8 U/L (ref 0–35)
AST: 18 U/L (ref 0–37)
Albumin: 4.3 g/dL (ref 3.5–5.2)
Alkaline Phosphatase: 79 U/L (ref 39–117)
BUN: 29 mg/dL — ABNORMAL HIGH (ref 6–23)
Calcium: 9.8 mg/dL (ref 8.4–10.5)
Chloride: 104 mEq/L (ref 96–112)
Potassium: 4.1 mEq/L (ref 3.5–5.3)
Sodium: 141 mEq/L (ref 135–145)
Total Protein: 7 g/dL (ref 6.0–8.3)

## 2012-09-20 ENCOUNTER — Encounter: Payer: Self-pay | Admitting: Internal Medicine

## 2012-09-20 ENCOUNTER — Ambulatory Visit (INDEPENDENT_AMBULATORY_CARE_PROVIDER_SITE_OTHER): Payer: 59 | Admitting: Internal Medicine

## 2012-09-20 ENCOUNTER — Other Ambulatory Visit: Payer: Self-pay | Admitting: Physician Assistant

## 2012-09-20 VITALS — BP 148/90 | HR 68 | Temp 97.5°F | Resp 12 | Wt 157.1 lb

## 2012-09-20 DIAGNOSIS — N390 Urinary tract infection, site not specified: Secondary | ICD-10-CM

## 2012-09-20 DIAGNOSIS — H8309 Labyrinthitis, unspecified ear: Secondary | ICD-10-CM

## 2012-09-20 LAB — URINE CULTURE: Colony Count: 9000

## 2012-09-20 MED ORDER — MECLIZINE HCL 12.5 MG PO TABS: 12.5000 mg | ORAL_TABLET | Freq: Three times a day (TID) | ORAL | Status: DC | PRN

## 2012-09-20 NOTE — Progress Notes (Signed)
  Subjective:    Patient ID: Monique [name], female    DOB: [birth year], 77 y.o.   MRN: [id number]  HPI Monique [name] reports that Saturday morning when she got up and was sitting on the side of the bed she felt very dizzy and when she got up she was staggering. Tuesday middle of the morning she suddenly felt very off balance interfering with walking. This lasted about 30 min. She went to Urgent Care. Records reviewed: she had a normal exam; CBC with mild elevation in WBC; very positive U/A with WBC TNTC. She was started on macrobid. She has not had any recurrent symptoms of dizziness since Tuesday. She does report GI distress since starting macrobid ( relatively contra-indicated due to her age.)  PMH, FamHx and SocHx reviewed for any changes and relevance. Current Outpatient Prescriptions on File Prior to Visit  Medication Sig Dispense Refill  . disopyramide (NORPACE) 150 MG capsule TAKE 1 TABLET BY MOUTH TWO TIMES A DAY  60 capsule  6  . losartan (COZAAR) 100 MG tablet Take 1 tablet (100 mg total) by mouth daily.  30 tablet  6  . metoprolol succinate (TOPROL-XL) 100 MG 24 hr tablet TAKE 1 TABLET BY MOUTH TWICE A DAY  60 tablet  6  . NIFEdipine (PROCARDIA-XL/ADALAT CC) 60 MG 24 hr tablet TAKE 1 TABLET (60 MG TOTAL) BY MOUTH DAILY.  30 tablet  0  . nitrofurantoin, macrocrystal-monohydrate, (MACROBID) 100 MG capsule Take 1 capsule (100 mg total) by mouth 2 (two) times daily.  14 capsule  0  . omeprazole (PRILOSEC) 20 MG capsule Take 1 capsule (20 mg total) by mouth daily.  30 capsule  11  . simvastatin (ZOCOR) 40 MG tablet Take 0.5 tablets (20 mg total) by mouth at bedtime.  30 tablet  11      Review of Systems System review is negative for any constitutional, cardiac, pulmonary, GI or neuro symptoms or complaints other than as described in the HPI.     Objective:   Physical Exam Filed Vitals:   09/20/12 1554  BP: 148/90  Pulse: 68  Temp: 97.5 F (36.4 C)  Resp: 12   Gen'l- WNWD AA  woman HEENT - C&S clear C&S clear, PERRLA Cor- 2+ radial pulse Pulm - normal respirations Abd - no flank tenderness to percussion. BS+, Tender to deep palpation right abdomen and supra-pubic region Neuro - A&O x 3, CN II-XII normal, Cerebellar - no pronator drift, normal gait, mild problem with tandem gait.         Assessment & Plan:  1. Dysequilibrium - history and exam most c/w labyrinthitis with more remote possibility of transient cerebral vascular event.  Plan Patient educated about labyrinthitis  ASA 81 mg daily  2. UTI - positive urinalysis.  Plan  - change to Septra DS bid x 10

## 2012-09-20 NOTE — Patient Instructions (Addendum)
Urinary track infection  - had a mild white count and a definitely abnormal urinalysis. Plan Finish the antibiotic  Drink a lot of fluids  Tylenol for any fever or aches  Dizziness - may have been due to the urinary track infection. Another cause may be "inner ear" problems: this is where the "gyroscope" in the skull right next to the hearing apparatus gets inflamed. The treatment for any "inner ear" problem is meclizine.  Plan - for recurrent or persistent dizziness get this prescription for meclizine filled.   Urinary Tract Infection Urinary tract infections (UTIs) can develop anywhere along your urinary tract. Your urinary tract is your body's drainage system for removing wastes and extra water. Your urinary tract includes two kidneys, two ureters, a bladder, and a urethra. Your kidneys are a pair of bean-shaped organs. Each kidney is about the size of your fist. They are located below your ribs, one on each side of your spine. CAUSES Infections are caused by microbes, which are microscopic organisms, including fungi, viruses, and bacteria. These organisms are so small that they can only be seen through a microscope. Bacteria are the microbes that most commonly cause UTIs. SYMPTOMS   Symptoms of UTIs may vary by age and gender of the patient and by the location of the infection. Symptoms in young women typically include a frequent and intense urge to urinate and a painful, burning feeling in the bladder or urethra during urination. Older women and men are more likely to be tired, shaky, and weak and have muscle aches and abdominal pain. A fever may mean the infection is in your kidneys. Other symptoms of a kidney infection include pain in your back or sides below the ribs, nausea, and vomiting. DIAGNOSIS To diagnose a UTI, your caregiver will ask you about your symptoms. Your caregiver also will ask to provide a urine sample. The urine sample will be tested for bacteria and white blood cells. White  blood cells are made by your body to help fight infection. TREATMENT   Typically, UTIs can be treated with medication. Because most UTIs are caused by a bacterial infection, they usually can be treated with the use of antibiotics. The choice of antibiotic and length of treatment depend on your symptoms and the type of bacteria causing your infection. HOME CARE INSTRUCTIONS  If you were prescribed antibiotics, take them exactly as your caregiver instructs you. Finish the medication even if you feel better after you have only taken some of the medication.   Drink enough water and fluids to keep your urine clear or pale yellow.   Avoid caffeine, tea, and carbonated beverages. They tend to irritate your bladder.   Empty your bladder often. Avoid holding urine for long periods of time.   Empty your bladder before and after sexual intercourse.   After a bowel movement, women should cleanse from front to back. Use each tissue only once.  SEEK MEDICAL CARE IF:    You have back pain.   You develop a fever.   Your symptoms do not begin to resolve within 3 days.  SEEK IMMEDIATE MEDICAL CARE IF:    You have severe back pain or lower abdominal pain.   You develop chills.   You have nausea or vomiting.   You have continued burning or discomfort with urination.  MAKE SURE YOU:    Understand these instructions.   Will watch your condition.   Will get help right away if you are not doing well or get  worse.  Document Released: 06/01/2005 Document Revised: 02/21/2012 Document Reviewed: 09/30/2011 [facility] Patient Information 2013 [place], [place].      Labyrinthitis (Inner Ear Inflammation) Your exam shows you have an inner ear disturbance or labyrinthitis. The cause of this condition is not known. But it may be due to a virus infection. The symptoms of labyrinthitis include vertigo or dizziness made worse by motion, nausea and vomiting. The onset of labyrinthitis may be very sudden. It  usually lasts for a few days and then clears up over 1-2 weeks. The treatment of an inner ear disturbance includes bed rest and medications to reduce dizziness, nausea, and vomiting. You should stay away from alcohol, tranquilizers, caffeine, nicotine, or any medicine your doctor thinks may make your symptoms worse. Further testing may be needed to evaluate your hearing and balance system. Please see your doctor or go to the emergency room right away if you have:  Increasing vertigo, earache, loss of hearing, or ear drainage.   Headache, blurred vision, trouble walking, fainting, or fever.   Persistent vomiting, dehydration, or extreme weakness.  Document Released: 08/22/2005 Document Revised: 11/14/2011 Document Reviewed: 02/07/2007 [facility] Patient Information 2013 [place], [place].

## 2012-09-22 MED ORDER — SULFAMETHOXAZOLE-TRIMETHOPRIM 800-160 MG PO TABS: 1.0000 | ORAL_TABLET | Freq: Two times a day (BID) | ORAL | Status: DC

## 2012-09-26 ENCOUNTER — Telehealth: Payer: Self-pay | Admitting: *Deleted

## 2012-09-26 NOTE — Telephone Encounter (Signed)
Called pt and lvm letting pt know that Dr Debby Bud wants her to begin taking aspirin 81mg  once a day. Told pt if she had any questions to call us back.

## 2012-10-22 ENCOUNTER — Other Ambulatory Visit: Payer: Self-pay | Admitting: Internal Medicine

## 2012-11-22 ENCOUNTER — Other Ambulatory Visit: Payer: Self-pay | Admitting: Internal Medicine

## 2012-11-30 ENCOUNTER — Other Ambulatory Visit: Payer: Self-pay | Admitting: Internal Medicine

## 2012-12-16 ENCOUNTER — Other Ambulatory Visit: Payer: Self-pay | Admitting: Internal Medicine

## 2012-12-16 ENCOUNTER — Other Ambulatory Visit: Payer: Self-pay | Admitting: Cardiology

## 2012-12-24 ENCOUNTER — Encounter: Payer: Self-pay | Admitting: *Deleted

## 2012-12-24 ENCOUNTER — Ambulatory Visit (INDEPENDENT_AMBULATORY_CARE_PROVIDER_SITE_OTHER): Payer: 59 | Admitting: Internal Medicine

## 2012-12-24 ENCOUNTER — Encounter: Payer: Self-pay | Admitting: Internal Medicine

## 2012-12-24 VITALS — BP 152/90 | HR 55 | Ht 65.0 in | Wt 160.0 lb

## 2012-12-24 DIAGNOSIS — I4891 Unspecified atrial fibrillation: Secondary | ICD-10-CM

## 2012-12-24 DIAGNOSIS — I10 Essential (primary) hypertension: Secondary | ICD-10-CM

## 2012-12-24 DIAGNOSIS — E7800 Pure hypercholesterolemia, unspecified: Secondary | ICD-10-CM

## 2012-12-24 LAB — CBC WITH DIFFERENTIAL/PLATELET
Basophils Absolute: 0 10*3/uL (ref 0.0–0.1)
Basophils Relative: 0.3 % (ref 0.0–3.0)
Hemoglobin: 13.4 g/dL (ref 12.0–15.0)
Lymphocytes Relative: 45.8 % (ref 12.0–46.0)
Monocytes Relative: 6.5 % (ref 3.0–12.0)
Neutro Abs: 3.7 10*3/uL (ref 1.4–7.7)
RBC: 4.21 Mil/uL (ref 3.87–5.11)
RDW: 13.4 % (ref 11.5–14.6)

## 2012-12-24 LAB — LIPID PANEL
Cholesterol: 161 mg/dL (ref 0–200)
HDL: 58.6 mg/dL (ref >39.00)
Total CHOL/HDL Ratio: 3
Triglycerides: 88 mg/dL (ref 0.0–149.0)

## 2012-12-24 MED ORDER — NIFEDIPINE ER OSMOTIC RELEASE 60 MG PO TB24: 60.0000 mg | ORAL_TABLET | Freq: Every day | ORAL | Status: DC

## 2012-12-24 MED ORDER — OMEPRAZOLE 20 MG PO CPDR: 20.0000 mg | DELAYED_RELEASE_CAPSULE | Freq: Every day | ORAL | Status: DC

## 2012-12-24 MED ORDER — METOPROLOL SUCCINATE ER 100 MG PO TB24: 100.0000 mg | ORAL_TABLET | Freq: Every day | ORAL | Status: DC

## 2012-12-24 MED ORDER — SIMVASTATIN 40 MG PO TABS: 40.0000 mg | ORAL_TABLET | Freq: Every day | ORAL | Status: DC

## 2012-12-24 MED ORDER — LOSARTAN POTASSIUM 100 MG PO TABS: 100.0000 mg | ORAL_TABLET | Freq: Every day | ORAL | Status: DC

## 2012-12-24 NOTE — Progress Notes (Addendum)
HPI Monique [name] is a 77 y.o. female who presents for follow up. She has a history of hypertrophic cardiomyopathy, hypertension, hyperlipidemia, paroxysmal atrial fibrillation and prior subarachnoid hemorrhage. She is not on Coumadin secondary to prior subarachnoid hemorrhage. Nuclear study 12/02: No scar or ischemia, EF 71%. LHC 9/09: EF 60%, no CAD, no LVOT gradient. Echocardiogram 10/05: Moderate LVH, moderate intracavitary gradient, grade 3 diastolic dysfunction, no MR. She was last seen by Dr. Tenny Craw on 12/11.   The patinet was last seen in clinic by Wende Mott in 2012.  I saw her in 2011 SPell of dizzinesss in Jan  Treated with Abx  Pain under l breast last week Thursday Dull Occurred while eating.  Bad.  Not pleuritic. Lasted about 30 min  Felt OK after. SInce then no pain on L side.  Had epigastric pain.  NO SOB NO change in catvitiy No Known Allergies  Current Outpatient Prescriptions  Medication Sig Dispense Refill  . disopyramide (NORPACE) 150 MG capsule TAKE 1 TABLET BY MOUTH TWO TIMES A DAY  30 capsule  0  . losartan (COZAAR) 100 MG tablet Take 1 tablet (100 mg total) by mouth daily.  30 tablet  6  . metoprolol succinate (TOPROL-XL) 100 MG 24 hr tablet TAKE 1 TABLET BY MOUTH TWICE A DAY  30 tablet  0  . NIFEdipine (PROCARDIA XL/ADALAT-CC) 60 MG 24 hr tablet TAKE 1 TABLET (60 MG TOTAL) BY MOUTH DAILY.  30 tablet  0  . omeprazole (PRILOSEC) 20 MG capsule Take 20 mg by mouth daily.      . simvastatin (ZOCOR) 40 MG tablet TAKE 1/2 TABLET AT BEDTIME  30 tablet  0   No current facility-administered medications for this visit.    Past Medical History  Diagnosis Date  . PAF (paroxysmal atrial fibrillation)   . LVH (left ventricular hypertrophy)   . Subarachnoid hemorrhage   . Hyperlipidemia   . HTN (hypertension)     Essential    Past Surgical History  Procedure Laterality Date  . Hysterectomy      No family history on file.  History   Social History  . Marital Status:  Single    Spouse Name: N/A    Number of Children: N/A  . Years of Education: N/A   Occupational History  . Not on file.   Social History Main Topics  . Smoking status: Former Games developer  . Smokeless tobacco: Never Used  . Alcohol Use: No  . Drug Use: No  . Sexually Active: Not Currently   Other Topics Concern  . Not on file   Social History Narrative      HSG. Married '61 - '88/divorced. 3 sons - '74, '59, '62, 1 daughter '63; 5 grandchildren. Lives alone.     Review of Systems:  All systems reviewed.  They are negative to the above problem except as previously stated.  Vital Signs: BP 152/90  Pulse 55  Ht 5\' 5"  (1.651 m)  Wt 160 lb (72.576 kg)  BMI 26.63 kg/m2  SpO2 98%  Physical Exam Patient is in AND HEENT:  Normocephalic, atraumatic. EOMI, PERRLA.  Neck: JVP is normal.  No bruits.  Lungs: clear to auscultation. No rales no wheezes.  Heart: Regular rate and rhythm. Normal S1, S2. No S3.   No significant murmurs. PMI not displaced.  Abdomen:  Supple, nontender. Normal bowel sounds. No masses. No hepatomegaly.  Extremities:   Good distal pulses throughout. No lower extremity edema.  Musculoskeletal :moving all extremities.  Neuro:   alert and oriented x3.  CN II-XII grossly intact.  EKG  SB 55 bpm.  LVH with repol abnormality Assessment and Plan:  1.  HCM  No evidence of obstruction on exam  No murmur   I will reivew echo  2.  HTN  Follow  I am not eager to push too low  3.  CP  I am not convinced cardiac in origin  I would follow  4.  PAF  No symptoms to suggest recurrence.  Not an anticoag candidate

## 2012-12-24 NOTE — Patient Instructions (Signed)
Your physician wants you to follow-up in: 12 months with Dr Filbert Schilder will receive a reminder letter in the mail two months in advance. If you don't receive a letter, please call our office to schedule the follow-up appointment.   Your physician recommends that you return for lab work today:  CBC/TSH/CMP/Lipid  Your physician recommends that you continue on your current medications as directed. Please refer to the Current Medication list given to you today.

## 2013-01-08 ENCOUNTER — Other Ambulatory Visit: Payer: Self-pay | Admitting: Internal Medicine

## 2013-01-09 ENCOUNTER — Ambulatory Visit (INDEPENDENT_AMBULATORY_CARE_PROVIDER_SITE_OTHER): Payer: 59 | Admitting: Internal Medicine

## 2013-01-09 ENCOUNTER — Ambulatory Visit (INDEPENDENT_AMBULATORY_CARE_PROVIDER_SITE_OTHER)
Admission: RE | Admit: 2013-01-09 | Discharge: 2013-01-09 | Disposition: A | Discharge status: Home / Self Care | Payer: 59 | Source: Ambulatory Visit | Attending: Internal Medicine | Admitting: Internal Medicine

## 2013-01-09 ENCOUNTER — Encounter: Payer: Self-pay | Admitting: Internal Medicine

## 2013-01-09 VITALS — BP 134/84 | HR 68 | Temp 97.6°F

## 2013-01-09 DIAGNOSIS — R109 Unspecified abdominal pain: Secondary | ICD-10-CM

## 2013-01-09 DIAGNOSIS — R52 Pain, unspecified: Secondary | ICD-10-CM

## 2013-01-09 LAB — POCT URINALYSIS DIPSTICK
Bilirubin, UA: NEGATIVE
Nitrite, UA: NEGATIVE
Spec Grav, UA: <=1.005
pH, UA: 5

## 2013-01-09 MED ORDER — KETOROLAC TROMETHAMINE 30 MG/ML IJ SOLN
30.0000 mg | Freq: Once | INTRAMUSCULAR | Status: AC
  Administered 2013-01-09: 30 mg via INTRAMUSCULAR

## 2013-01-09 MED ORDER — HYDROCODONE-ACETAMINOPHEN 10-325 MG PO TABS: 1.0000 | ORAL_TABLET | Freq: Three times a day (TID) | ORAL | Status: DC | PRN

## 2013-01-09 NOTE — Progress Notes (Signed)
  Subjective:    Patient ID: Monique [name], female    DOB: [birth year], 77 y.o.   MRN: [id number]  HPI Here for R side flank pain Onset 5 days ago, spasm and "waves" of intense pain - upto 10//10, currently 6/10 associated with nausea But denies vomiting, dysuria, hematuria or fever Hx same -similar to prior kidney stone  Past Medical History  Diagnosis Date  . PAF (paroxysmal atrial fibrillation)   . LVH (left ventricular hypertrophy)   . Subarachnoid hemorrhage   . Hyperlipidemia   . HTN (hypertension)     Essential    Review of Systems  Constitutional: Negative for fatigue.  Respiratory: Negative for cough and shortness of breath.   Cardiovascular: Negative for chest pain and leg swelling.  Genitourinary: Positive for flank pain and pelvic pain. Negative for dysuria, urgency, frequency, hematuria and difficulty urinating.  Neurological: Negative for weakness and headaches.       Objective:   Physical Exam BP 134/84  Pulse 68  Temp(Src) 97.6 F (36.4 C) (Oral)  SpO2 96% Wt Readings from Last 3 Encounters:  12/24/12 160 lb (72.576 kg)  09/20/12 157 lb 1.3 oz (71.251 kg)  09/18/12 160 lb (72.576 kg)   Constitutional: She appears well-developed and well-nourished. Uncomfortable but no distress.  Cardiovascular: Normal rate, regular rhythm and normal heart sounds.  No murmur heard. No BLE edema. Pulmonary/Chest: Effort normal and breath sounds normal. No respiratory distress. She has no wheezes.  Abdominal: Soft. Bowel sounds are normal. She exhibits no distension. There is R flank tenderness. no masses Musculoskeletal: Normal range of motion, no joint effusions. No gross deformities Skin: Skin is warm and dry. No rash noted. No erythema.  Psychiatric: She has a normal mood and affect. Her behavior is normal. Judgment and thought content normal.   Lab Results  Component Value Date   WBC 7.9 12/24/2012   HGB 13.4 12/24/2012   HCT 40.3 12/24/2012   PLT 195.0 12/24/2012    GLUCOSE 115* 09/18/2012   CHOL 161 12/24/2012   TRIG 88.0 12/24/2012   HDL 58.60 12/24/2012   LDLCALC 85 12/24/2012   ALT <8 09/18/2012   AST 18 09/18/2012   NA 141 09/18/2012   K 4.1 09/18/2012   CL 104 09/18/2012   CREATININE 1.58* 09/18/2012   BUN 29* 09/18/2012   CO2 27 09/18/2012   TSH 0.74 12/24/2012   INR 1.0 05/21/2008        Assessment & Plan:   R flank pain - hx concerning for kidney stone (reports hx same) Check Udip> large blood Toradol 30mg  IM now Refer for CT norco prn and hydration advised -further treatment instructions to depend on test results

## 2013-01-09 NOTE — Patient Instructions (Signed)
It was good to see you today. Toradol 30 mg injection given to you for pain today Test(s) ordered today. Your results will be released to MyChart (or called to you) after review, usually within 72hours after test completion. If any changes need to be made, you will be notified at that same time. CT scan today to look for kidney stone. You'll be contacted regarding these results once reviewed Use Norco every 6 hours as needed for pain unrelieved by ibuprofen - Your prescription(s) have been submitted to your pharmacy. Please take as directed and contact our office if you believe you are having problem(s) with the medication(s). Push fluids and hydration, call if symptoms worse or unimproved  Kidney Stones Kidney stones (ureteral lithiasis) are solid masses that form inside your kidneys. The intense pain is caused by the stone moving through the kidney, ureter, bladder, and urethra (urinary tract). When the stone moves, the ureter starts to spasm around the stone. The stone is usually passed in the urine.  HOME CARE  Drink enough fluids to keep your pee (urine) clear or pale yellow. This helps to get the stone out.  Strain all pee through the provided strainer. Do not pee without peeing through the strainer, not even once. If you pee the stone out, catch it. The stone may be as small as a grain of salt. Take this to your doctor.  Only take medicine as told by your doctor.  Follow up with your doctor as told.  Get follow-up X-rays as told by your doctor. GET HELP RIGHT AWAY IF:   Your pain does not get better with medicine.  You have a fever.  Your pain increases and gets worse over 18 hours.  You have new belly (abdominal) pain.  You feel faint or pass out. MAKE SURE YOU:   Understand these instructions.  Will watch your condition.  Will get help right away if you are not doing well or get worse. Document Released: 02/08/2008 Document Revised: 11/14/2011 Document Reviewed:  06/19/2009 [facility] Patient Information 2013 [place], [place].

## 2013-01-14 ENCOUNTER — Other Ambulatory Visit: Payer: Self-pay | Admitting: Internal Medicine

## 2013-01-15 ENCOUNTER — Telehealth: Payer: Self-pay | Admitting: Internal Medicine

## 2013-01-15 NOTE — Telephone Encounter (Signed)
Please F/U with pt for appointment.

## 2013-01-15 NOTE — Telephone Encounter (Signed)
APPT MAY 14

## 2013-01-15 NOTE — Telephone Encounter (Addendum)
Patient Information:  Caller Name: Corie  Phone: [phone]  Patient: Monique [name]  Gender: Female  DOB: 12-17-1935  Age: 77 Years  PCP: Illene Regulus (Adults only)  Office Follow Up:  Does the office need to follow up with this patient?: Yes  Instructions For The Office: NO appts available today and not able to make an appt for today d/t transportation issues and wants an appt tomorrow for possible Shingles.   Please follow up with caller.  Thank you.   Symptoms  Reason For Call & Symptoms: Pt seen on 5/7 for right flank discomfort. Caller was advised to call in if a rash developed.Caller has a rash with a burning sensation on the right flank/side area with blisters.  Pt says the area looks like she was burned.  Area is very sensitive to the touch.  She has pain medication that is helping with the discomfort.  Afebrile.     Reviewed Health History In EMR: Yes  Reviewed Medications In EMR: Yes  Reviewed Allergies In EMR: Yes  Reviewed Surgeries / Procedures: Yes  Date of Onset of Symptoms: 01/09/2013  Guideline(s) Used:   Rash or Redness - Localized  Disposition Per Guideline:   See Today in Office  Reason For Disposition Reached:   Painful rash and has multiple small blisters grouped together in one area of body (i.e., dermatomal distribution or "band" or "stripe")  Advice Given:  Call Back If:  Fever occurs  You become worse. Apply cool compresses  Patient Will Follow Care Advice:  YES

## 2013-01-16 ENCOUNTER — Ambulatory Visit (INDEPENDENT_AMBULATORY_CARE_PROVIDER_SITE_OTHER): Payer: 59 | Admitting: Internal Medicine

## 2013-01-16 ENCOUNTER — Encounter: Payer: Self-pay | Admitting: Internal Medicine

## 2013-01-16 VITALS — BP 142/88 | HR 65 | Temp 97.9°F

## 2013-01-16 DIAGNOSIS — B029 Zoster without complications: Secondary | ICD-10-CM

## 2013-01-16 MED ORDER — VALACYCLOVIR HCL 1 G PO TABS
1000.0000 mg | ORAL_TABLET | Freq: Three times a day (TID) | ORAL | Status: DC
Start: 2013-01-16 — End: 2013-03-20

## 2013-01-16 MED ORDER — GABAPENTIN 100 MG PO CAPS: 100.0000 mg | ORAL_CAPSULE | Freq: Three times a day (TID) | ORAL | Status: DC

## 2013-01-16 NOTE — Progress Notes (Signed)
Subjective:    Patient ID: Monique [name], female    DOB: 10-Sep-1935, 77 y.o.   MRN: [id number]  HPI  Pt presents to the clinic today with c/o a rash on the right side of her abdomen. This started 6 days ago. The rash is very painful and has a burning sensation. She is taking Norco without relief. At first, she thought it was related to the kidney stone that she has. She has had chicken pox as a child. She has never had shingles that she is aware of. She has not has the Zoster vaccine. She denies fever, chills or body aches.  Review of Systems      Past Medical History  Diagnosis Date  . PAF (paroxysmal atrial fibrillation)   . LVH (left ventricular hypertrophy)   . Subarachnoid hemorrhage   . Hyperlipidemia   . HTN (hypertension)     Essential    Current Outpatient Prescriptions  Medication Sig Dispense Refill  . disopyramide (NORPACE) 150 MG capsule TAKE 1 CAPSULE TWICE A DAY  30 capsule  0  . HYDROcodone-acetaminophen (NORCO) 10-325 MG per tablet Take 1 tablet by mouth every 8 (eight) hours as needed for pain.  30 tablet  0  . losartan (COZAAR) 100 MG tablet Take 1 tablet (100 mg total) by mouth daily.  90 tablet  3  . metoprolol succinate (TOPROL-XL) 100 MG 24 hr tablet Take 1 tablet (100 mg total) by mouth daily. Take with or immediately following a meal.  90 tablet  3  . metoprolol succinate (TOPROL-XL) 100 MG 24 hr tablet TAKE 1 TABLET BY MOUTH TWICE A DAY  30 tablet  0  . NIFEdipine (PROCARDIA XL/ADALAT-CC) 60 MG 24 hr tablet Take 1 tablet (60 mg total) by mouth daily.  90 tablet  3  . omeprazole (PRILOSEC) 20 MG capsule Take 1 capsule (20 mg total) by mouth daily.  90 capsule  3  . simvastatin (ZOCOR) 40 MG tablet Take 1 tablet (40 mg total) by mouth at bedtime.  45 tablet  3   No current facility-administered medications for this visit.    No Known Allergies  No family history on file.  History   Social History  . Marital Status: Single    Spouse Name: N/A   Number of Children: N/A  . Years of Education: N/A   Occupational History  . Not on file.   Social History Main Topics  . Smoking status: Former Games developer  . Smokeless tobacco: Never Used  . Alcohol Use: No  . Drug Use: No  . Sexually Active: Not Currently   Other Topics Concern  . Not on file   Social History Narrative      HSG. Married '61 - '88/divorced. 3 sons - '74, '59, '62, 1 daughter '63; 5 grandchildren. Lives alone.      Constitutional: Denies fever, malaise, fatigue, headache or abrupt weight changes.   Skin: Pt reports rash of right abdomen. Denies, lesions or ulcercations.  Neurological: Pt reports burning sensation of skin. Denies dizziness, difficulty with memory, difficulty with speech or problems with balance and coordination.   No other specific complaints in a complete review of systems (except as listed in HPI above).  Objective:   Physical Exam   BP 142/88  Pulse 65  Temp(Src) 97.9 F (36.6 C) (Oral)  SpO2 97% Wt Readings from Last 3 Encounters:  12/24/12 160 lb (72.576 kg)  09/20/12 157 lb 1.3 oz (71.251 kg)  09/18/12 160 lb (72.576 kg)  General: Appears her stated age, well developed, well nourished in NAD. Skin: Warm, dry and intact. Linear rash in dermatome form of right abdomen resembles shingles.  Cardiovascular: Normal rate and rhythm. S1,S2 noted.  No murmur, rubs or gallops noted. No JVD or BLE edema. No carotid bruits noted. Pulmonary/Chest: Normal effort and positive vesicular breath sounds. No respiratory distress. No wheezes, rales or ronchi noted.  Neurological: Alert and oriented. Cranial nerves II-XII intact. Coordination normal. +DTRs bilaterally.       Assessment & Plan:   Shingles of right abdomen, new onset:  Advised pt to get shingles vaccine eRx for Neurontin TID eRx for Valtrex TID  RTC as needed or if symptoms persist

## 2013-01-16 NOTE — Patient Instructions (Signed)

## 2013-01-24 ENCOUNTER — Other Ambulatory Visit: Payer: Self-pay | Admitting: Internal Medicine

## 2013-01-31 ENCOUNTER — Other Ambulatory Visit: Payer: Self-pay | Admitting: Internal Medicine

## 2013-02-18 ENCOUNTER — Other Ambulatory Visit: Payer: Self-pay | Admitting: Internal Medicine

## 2013-02-23 ENCOUNTER — Other Ambulatory Visit: Payer: Self-pay | Admitting: Internal Medicine

## 2013-02-26 ENCOUNTER — Other Ambulatory Visit: Payer: Self-pay

## 2013-03-15 ENCOUNTER — Other Ambulatory Visit: Payer: Self-pay | Admitting: Internal Medicine

## 2013-03-20 ENCOUNTER — Encounter: Payer: Self-pay | Admitting: Internal Medicine

## 2013-03-20 ENCOUNTER — Ambulatory Visit (INDEPENDENT_AMBULATORY_CARE_PROVIDER_SITE_OTHER): Payer: 59 | Admitting: Internal Medicine

## 2013-03-20 VITALS — BP 128/80 | HR 54 | Temp 97.7°F | Wt 159.8 lb

## 2013-03-20 DIAGNOSIS — B029 Zoster without complications: Secondary | ICD-10-CM

## 2013-03-20 DIAGNOSIS — K30 Functional dyspepsia: Secondary | ICD-10-CM

## 2013-03-20 DIAGNOSIS — R1013 Epigastric pain: Secondary | ICD-10-CM

## 2013-03-20 NOTE — Progress Notes (Signed)
  Subjective:    Patient ID: Monique [name], female    DOB: [birth year], 77 y.o.   MRN: [id number]  HPI Monique [name] presents for follow  Up. She saw Monique [name] May 14th diagnosed with shingle T4-5 around the stomach on the right. She has made a good recovery w/o PHN. She was advised to have zostavax.  Work has advised her to have Hep B immunization.  She did have episode of substernal chest pain that did respond to Prilosec. She has minimal occasional symptoms at this time.   PMH, FamHx and SocHx reviewed for any changes and relevance.  Current Outpatient Prescriptions on File Prior to Visit  Medication Sig Dispense Refill  . disopyramide (NORPACE) 150 MG capsule TAKE 1 CAPSULE TWICE A DAY  30 capsule  11  . losartan (COZAAR) 100 MG tablet Take 1 tablet (100 mg total) by mouth daily.  90 tablet  3  . metoprolol succinate (TOPROL-XL) 100 MG 24 hr tablet TAKE 1 TABLET BY MOUTH TWICE A DAY  30 tablet  0  . NIFEdipine (PROCARDIA XL/ADALAT-CC) 60 MG 24 hr tablet Take 1 tablet (60 mg total) by mouth daily.  90 tablet  3  . omeprazole (PRILOSEC) 20 MG capsule Take 1 capsule (20 mg total) by mouth daily.  90 capsule  3  . simvastatin (ZOCOR) 40 MG tablet Take 1 tablet (40 mg total) by mouth at bedtime.  45 tablet  3   No current facility-administered medications on file prior to visit.      Review of Systems System review is negative for any constitutional, cardiac, pulmonary, GI or neuro symptoms or complaints other than as described in the HPI.     Objective:   Physical Exam Filed Vitals:   03/20/13 1637  BP: 128/80  Pulse: 54  Temp: 97.7 F (36.5 C)   Gen'l- WNWD AA woman Cor- RRR Abd - tender to palpation at the epigasrium Derm- scarring from shingles right abdomen       Assessment & Plan:  1, Shingles - resolved w/o PHN. She will check insurance coverage for Zostavax  2. Dyspepsia with reflux  Plan Take prilosec 20 mg every morning for 2 weeks to allow for healing and then  you can stop   May use Zantac (generic) prn after completing Prilosec.  3. Need for immunization - Hep B - a series of three shots. Will need to arrange a nurse visit for this.

## 2013-03-20 NOTE — Patient Instructions (Addendum)
1. Shingles - sorry you had that. You do need to have the vaccine - Zostavax. Check with your insurance to be sure that it is covered.  2. Hep B - a series of three shots. Will need to arrange a nurse visit for this.  3. Stomach - sounds like you had reflux and you still have some tenderness in the abdomen. Plan Take prilosec 20 mg every morning for 2 weeks to allow for healing and then you can stop the prilosec and use either an antacid or Zantac as needed.

## 2013-03-21 ENCOUNTER — Telehealth: Payer: Self-pay

## 2013-03-21 NOTE — Telephone Encounter (Signed)
We currently do not have Hep B in our office. The order is being placed today. Once it is delivered I will call the patient to schedule her first of three vaccinations.

## 2013-03-21 NOTE — Telephone Encounter (Signed)
Message copied by Noreene Larsson on Thu Mar 21, 2013  8:19 AM ------      Message from: Illene Regulus E      Created: Wed Mar 20, 2013  5:25 PM       Please call Ms. Lauter to set up a nurse visit for 1st of 3 hep B shots. She will also check on coverage for Zostavax which can also be given.  ------

## 2013-03-26 ENCOUNTER — Telehealth: Payer: Self-pay

## 2013-03-26 NOTE — Telephone Encounter (Signed)
Left message for patient to return call to be scheduled for hep B injection, nurse visit

## 2013-03-26 NOTE — Telephone Encounter (Signed)
Left message for patient to call back so she can be scheduled for a nurse visit for Hep B

## 2013-04-08 ENCOUNTER — Other Ambulatory Visit: Payer: Self-pay | Admitting: Internal Medicine

## 2013-04-09 ENCOUNTER — Ambulatory Visit (INDEPENDENT_AMBULATORY_CARE_PROVIDER_SITE_OTHER): Payer: 59

## 2013-04-09 ENCOUNTER — Ambulatory Visit: Payer: 59

## 2013-04-09 DIAGNOSIS — Z2911 Encounter for prophylactic immunotherapy for respiratory syncytial virus (RSV): Secondary | ICD-10-CM

## 2013-04-09 DIAGNOSIS — Z23 Encounter for immunization: Secondary | ICD-10-CM

## 2013-07-18 ENCOUNTER — Ambulatory Visit (INDEPENDENT_AMBULATORY_CARE_PROVIDER_SITE_OTHER): Payer: 59 | Admitting: Family Medicine

## 2013-07-18 VITALS — BP 140/90 | HR 75 | Temp 98.2°F | Resp 17 | Ht 67.5 in | Wt 165.0 lb

## 2013-07-18 DIAGNOSIS — R300 Dysuria: Secondary | ICD-10-CM

## 2013-07-18 DIAGNOSIS — N390 Urinary tract infection, site not specified: Secondary | ICD-10-CM

## 2013-07-18 LAB — POCT URINALYSIS DIPSTICK
Bilirubin, UA: NEGATIVE
Ketones, UA: NEGATIVE
Protein, UA: 100
pH, UA: 5.5

## 2013-07-18 LAB — POCT UA - MICROSCOPIC ONLY
Casts, Ur, LPF, POC: NEGATIVE
Crystals, Ur, HPF, POC: NEGATIVE
Mucus, UA: NEGATIVE
Yeast, UA: NEGATIVE

## 2013-07-18 MED ORDER — CEPHALEXIN 500 MG PO CAPS: 500.0000 mg | ORAL_CAPSULE | Freq: Two times a day (BID) | ORAL | Status: DC

## 2013-07-18 NOTE — Patient Instructions (Signed)
Use the keflex as directed for your bladder infection.  If you are not better in the next couple of days please give me a call or RTC.    Also, try pyridium (azo) for the urinary pain.  It will turn your urine bright orange!

## 2013-07-18 NOTE — Progress Notes (Addendum)
[facility] [address] [place] [address] [phone]  Date:  07/18/2013   Name:  Monique [name]   DOB:  1936-01-14   MRN:  [id number]  PCP:  Illene Regulus, MD    Chief Complaint: burning with urination   History of Present Illness:  Monique [name] is a 77 y.o. very pleasant female patient who presents with the following:  She has noted dysuria for about 5 days.  She also notes urinary frequency.  No hematuria.   No fever or chills, otherwise she feels well.   No GI symptoms.   No abdominal pain  She feels a mild ache in her lower back She has a histry of a fib but this is not active per her report Patient Active Problem List   Diagnosis Date Noted  . Gastritis 05/16/2012  . HEADACHE 06/08/2010  . HYPERLIPIDEMIA 05/20/2008  . UNSPECIFIED ESSENTIAL HYPERTENSION 05/20/2008  . CARDIOMYOPATHY, HYPERTROPHIC 05/20/2008  . PAROXYSMAL ATRIAL FIBRILLATION 05/20/2008  . SUBARACHNOID HEMORRHAGE 05/20/2008    Past Medical History  Diagnosis Date  . PAF (paroxysmal atrial fibrillation)   . LVH (left ventricular hypertrophy)   . Subarachnoid hemorrhage   . Hyperlipidemia   . HTN (hypertension)     Essential    Past Surgical History  Procedure Laterality Date  . Hysterectomy      History  Substance Use Topics  . Smoking status: Former Games developer  . Smokeless tobacco: Never Used  . Alcohol Use: No    No family history on file.  No Known Allergies  Medication list has been reviewed and updated.  Current Outpatient Prescriptions on File Prior to Visit  Medication Sig Dispense Refill  . disopyramide (NORPACE) 150 MG capsule TAKE 1 CAPSULE TWICE A DAY  30 capsule  11  . losartan (COZAAR) 100 MG tablet Take 1 tablet (100 mg total) by mouth daily.  90 tablet  3  . metoprolol succinate (TOPROL-XL) 100 MG 24 hr tablet TAKE 1 TABLET BY MOUTH TWICE A DAY  180 tablet  1  . NIFEdipine (PROCARDIA XL/ADALAT-CC) 60 MG 24 hr tablet Take 1 tablet (60 mg  total) by mouth daily.  90 tablet  3  . omeprazole (PRILOSEC) 20 MG capsule Take 1 capsule (20 mg total) by mouth daily.  90 capsule  3  . simvastatin (ZOCOR) 40 MG tablet Take 1 tablet (40 mg total) by mouth at bedtime.  45 tablet  3   No current facility-administered medications on file prior to visit.    Review of Systems:  As per HPI- otherwise negative.   Physical Examination: Filed Vitals:   07/18/13 1451  BP: 162/84  Pulse: 75  Temp: 98.2 F (36.8 C)  Resp: 17   Filed Vitals:   07/18/13 1451  Height: 5' 7.5" (1.715 m)  Weight: 165 lb (74.844 kg)   Body mass index is 25.45 kg/(m^2). Ideal Body Weight: Weight in (lb) to have BMI = 25: 161.7  GEN: WDWN, NAD, Non-toxic, A & O x 3 HEENT: Atraumatic, Normocephalic. Neck supple. No masses, No LAD. Ears and Nose: No external deformity. CV: RRR, No M/G/R. No JVD. No thrill. No extra heart sounds. PULM: CTA B, no wheezes, crackles, rhonchi. No retractions. No resp. distress. No accessory muscle use. ABD: S, NT, ND, +BS. No rebound. No HSM. EXTR: No c/c/e NEURO Normal gait.  PSYCH: Normally interactive. Conversant. Not depressed or anxious appearing.  Calm demeanor.  No CVA tenderness   Results for  orders placed in visit on 07/18/13  POCT URINALYSIS DIPSTICK      Result Value Range   Color, UA yellow     Clarity, UA cloudy     Glucose, UA neg     Bilirubin, UA neg     Ketones, UA neg     Spec Grav, UA 1.020     Blood, UA moderate     pH, UA 5.5     Protein, UA 100     Urobilinogen, UA 0.2     Nitrite, UA positive     Leukocytes, UA large (3+)    POCT UA - MICROSCOPIC ONLY      Result Value Range   WBC, Ur, HPF, POC tntc     RBC, urine, microscopic neg     Bacteria, U Microscopic 3+     Mucus, UA neg     Epithelial cells, urine per micros 0-2     Crystals, Ur, HPF, POC neg     Casts, Ur, LPF, POC neg     Yeast, UA neg      Assessment and Plan: Dysuria - Plan: POCT urinalysis dipstick, POCT UA -  Microscopic Only, Urine culture, cephALEXin (KEFLEX) 500 MG capsule  UTI (urinary tract infection)  Treat for UTI with keflex, await culture See patient instructions for more details.     Signed Abbe Amsterdam, MD Received urine culture.  She is sensitive to cefazolin so keflex should be effective

## 2013-07-25 ENCOUNTER — Telehealth: Payer: Self-pay

## 2013-07-25 DIAGNOSIS — Z1239 Encounter for other screening for malignant neoplasm of breast: Secondary | ICD-10-CM

## 2013-07-25 NOTE — Telephone Encounter (Signed)
Patient called for a referral to the breast cancer center on [address] for a mammogram.

## 2013-07-26 NOTE — Telephone Encounter (Signed)
K. Ordered placed

## 2013-09-20 ENCOUNTER — Ambulatory Visit (INDEPENDENT_AMBULATORY_CARE_PROVIDER_SITE_OTHER): Payer: 59 | Admitting: Emergency Medicine

## 2013-09-20 VITALS — BP 138/88 | HR 66 | Temp 97.5°F | Resp 16 | Ht 65.0 in | Wt 164.0 lb

## 2013-09-20 DIAGNOSIS — IMO0001 Reserved for inherently not codable concepts without codable children: Secondary | ICD-10-CM

## 2013-09-20 DIAGNOSIS — R350 Frequency of micturition: Secondary | ICD-10-CM

## 2013-09-20 DIAGNOSIS — N3000 Acute cystitis without hematuria: Secondary | ICD-10-CM

## 2013-09-20 DIAGNOSIS — M5430 Sciatica, unspecified side: Secondary | ICD-10-CM

## 2013-09-20 LAB — POCT URINALYSIS DIPSTICK
Bilirubin, UA: NEGATIVE
GLUCOSE UA: NEGATIVE
Ketones, UA: NEGATIVE
NITRITE UA: POSITIVE
Protein, UA: 30
Spec Grav, UA: 1.02
UROBILINOGEN UA: 0.2
pH, UA: 5.5

## 2013-09-20 LAB — POCT UA - MICROSCOPIC ONLY
Casts, Ur, LPF, POC: NEGATIVE
Crystals, Ur, HPF, POC: NEGATIVE
Mucus, UA: NEGATIVE
Yeast, UA: POSITIVE

## 2013-09-20 MED ORDER — NAPROXEN SODIUM 550 MG PO TABS: 550.0000 mg | ORAL_TABLET | Freq: Two times a day (BID) | ORAL | Status: DC

## 2013-09-20 MED ORDER — PHENAZOPYRIDINE HCL 200 MG PO TABS: 200.0000 mg | ORAL_TABLET | Freq: Three times a day (TID) | ORAL | Status: DC | PRN

## 2013-09-20 MED ORDER — SULFAMETHOXAZOLE-TMP DS 800-160 MG PO TABS: 1.0000 | ORAL_TABLET | Freq: Two times a day (BID) | ORAL | Status: DC

## 2013-09-20 NOTE — Patient Instructions (Signed)
Urinary Tract Infection Urinary tract infections (UTIs) can develop anywhere along your urinary tract. Your urinary tract is your body's drainage system for removing wastes and extra water. Your urinary tract includes two kidneys, two ureters, a bladder, and a urethra. Your kidneys are a pair of bean-shaped organs. Each kidney is about the size of your fist. They are located below your ribs, one on each side of your spine. CAUSES Infections are caused by microbes, which are microscopic organisms, including fungi, viruses, and bacteria. These organisms are so small that they can only be seen through a microscope. Bacteria are the microbes that most commonly cause UTIs. SYMPTOMS  Symptoms of UTIs may vary by age and gender of the patient and by the location of the infection. Symptoms in young women typically include a frequent and intense urge to urinate and a painful, burning feeling in the bladder or urethra during urination. Older women and men are more likely to be tired, shaky, and weak and have muscle aches and abdominal pain. A fever may mean the infection is in your kidneys. Other symptoms of a kidney infection include pain in your back or sides below the ribs, nausea, and vomiting. DIAGNOSIS To diagnose a UTI, your caregiver will ask you about your symptoms. Your caregiver also will ask to provide a urine sample. The urine sample will be tested for bacteria and white blood cells. White blood cells are made by your body to help fight infection. TREATMENT  Typically, UTIs can be treated with medication. Because most UTIs are caused by a bacterial infection, they usually can be treated with the use of antibiotics. The choice of antibiotic and length of treatment depend on your symptoms and the type of bacteria causing your infection. HOME CARE INSTRUCTIONS  If you were prescribed antibiotics, take them exactly as your caregiver instructs you. Finish the medication even if you feel better after you  have only taken some of the medication.  Drink enough water and fluids to keep your urine clear or pale yellow.  Avoid caffeine, tea, and carbonated beverages. They tend to irritate your bladder.  Empty your bladder often. Avoid holding urine for long periods of time.  Empty your bladder before and after sexual intercourse.  After a bowel movement, women should cleanse from front to back. Use each tissue only once. SEEK MEDICAL CARE IF:   You have back pain.  You develop a fever.  Your symptoms do not begin to resolve within 3 days. SEEK IMMEDIATE MEDICAL CARE IF:   You have severe back pain or lower abdominal pain.  You develop chills.  You have nausea or vomiting.  You have continued burning or discomfort with urination. MAKE SURE YOU:   Understand these instructions.  Will watch your condition.  Will get help right away if you are not doing well or get worse. Document Released: 06/01/2005 Document Revised: 02/21/2012 Document Reviewed: 09/30/2011 [facility] Patient Information 2014 [place]. Sciatica Sciatica is pain, weakness, numbness, or tingling along the path of the sciatic nerve. The nerve starts in the lower back and runs down the back of each leg. The nerve controls the muscles in the lower leg and in the back of the knee, while also providing sensation to the back of the thigh, lower leg, and the sole of your foot. Sciatica is a symptom of another medical condition. For instance, nerve damage or certain conditions, such as a herniated disk or bone spur on the spine, pinch or put pressure on the  sciatic nerve. This causes the pain, weakness, or other sensations normally associated with sciatica. Generally, sciatica only affects one side of the body. CAUSES   Herniated or slipped disc.  Degenerative disk disease.  A pain disorder involving the narrow muscle in the buttocks (piriformis syndrome).  Pelvic injury or fracture.  Pregnancy.  Tumor  (rare). SYMPTOMS  Symptoms can vary from mild to very severe. The symptoms usually travel from the low back to the buttocks and down the back of the leg. Symptoms can include:  Mild tingling or dull aches in the lower back, leg, or hip.  Numbness in the back of the calf or sole of the foot.  Burning sensations in the lower back, leg, or hip.  Sharp pains in the lower back, leg, or hip.  Leg weakness.  Severe back pain inhibiting movement. These symptoms may get worse with coughing, sneezing, laughing, or prolonged sitting or standing. Also, being overweight may worsen symptoms. DIAGNOSIS  Your caregiver will perform a physical exam to look for common symptoms of sciatica. He or she may ask you to do certain movements or activities that would trigger sciatic nerve pain. Other tests may be performed to find the cause of the sciatica. These may include:  Blood tests.  X-rays.  Imaging tests, such as an MRI or CT scan. TREATMENT  Treatment is directed at the cause of the sciatic pain. Sometimes, treatment is not necessary and the pain and discomfort goes away on its own. If treatment is needed, your caregiver may suggest:  Over-the-counter medicines to relieve pain.  Prescription medicines, such as anti-inflammatory medicine, muscle relaxants, or narcotics.  Applying heat or ice to the painful area.  Steroid injections to lessen pain, irritation, and inflammation around the nerve.  Reducing activity during periods of pain.  Exercising and stretching to strengthen your abdomen and improve flexibility of your spine. Your caregiver may suggest losing weight if the extra weight makes the back pain worse.  Physical therapy.  Surgery to eliminate what is pressing or pinching the nerve, such as a bone spur or part of a herniated disk. HOME CARE INSTRUCTIONS   Only take over-the-counter or prescription medicines for pain or discomfort as directed by your caregiver.  Apply ice to the  affected area for 20 minutes, 3 4 times a day for the first 48 72 hours. Then try heat in the same way.  Exercise, stretch, or perform your usual activities if these do not aggravate your pain.  Attend physical therapy sessions as directed by your caregiver.  Keep all follow-up appointments as directed by your caregiver.  Do not wear high heels or shoes that do not provide proper support.  Check your mattress to see if it is too soft. A firm mattress may lessen your pain and discomfort. SEEK IMMEDIATE MEDICAL CARE IF:   You lose control of your bowel or bladder (incontinence).  You have increasing weakness in the lower back, pelvis, buttocks, or legs.  You have redness or swelling of your back.  You have a burning sensation when you urinate.  You have pain that gets worse when you lie down or awakens you at night.  Your pain is worse than you have experienced in the past.  Your pain is lasting longer than 4 weeks.  You are suddenly losing weight without reason. MAKE SURE YOU:  Understand these instructions.  Will watch your condition.  Will get help right away if you are not doing well or get worse. Document  Released: 08/16/2001 Document Revised: 02/21/2012 Document Reviewed: 01/01/2012 [facility] Patient Information 2014 [place].

## 2013-09-20 NOTE — Progress Notes (Signed)
[facility] [address] [place] [address] [phone]  Date:  09/20/2013   Name:  Monique [name]   DOB:  May 17, 1936   MRN:  [id number]  PCP:  Adella Hare, MD    Chief Complaint: Hip Pain   History of Present Illness:  Monique [name] is a 78 y.o. very pleasant female patient who presents with the following:  Pain in lower abdomen associated with frequent urination and urge and gravity incontinence.  Has urgency.  No dysuria just pelvic pain.  No nausea or vomiting. No GYN symptoms. No hematuria.  Treated for cystitis in early November.  No improvement with over the counter medications or other home remedies.  Developed pain in left leg in calf started a week ago radiating from thigh.  Says is forced to lift her leg out of the car.  No history of injury or overuse.  No neuro symptoms.   Vague burning pain. Worse with walking and standing less with rest. No history of sciatic neuritis.  No improvement with over the counter medications or other home remedies. Denies other complaint or health concern today.   Patient Active Problem List   Diagnosis Date Noted  . Gastritis 05/16/2012  . HEADACHE 06/08/2010  . HYPERLIPIDEMIA 05/20/2008  . UNSPECIFIED ESSENTIAL HYPERTENSION 05/20/2008  . CARDIOMYOPATHY, HYPERTROPHIC 05/20/2008  . PAROXYSMAL ATRIAL FIBRILLATION 05/20/2008  . SUBARACHNOID HEMORRHAGE 05/20/2008    Past Medical History  Diagnosis Date  . PAF (paroxysmal atrial fibrillation)   . LVH (left ventricular hypertrophy)   . Subarachnoid hemorrhage   . Hyperlipidemia   . HTN (hypertension)     Essential    Past Surgical History  Procedure Laterality Date  . Hysterectomy      History  Substance Use Topics  . Smoking status: Former Research scientist (life sciences)  . Smokeless tobacco: Never Used  . Alcohol Use: No    No family history on file.  No Known Allergies  Medication list has been reviewed and updated.  Current Outpatient Prescriptions on File  Prior to Visit  Medication Sig Dispense Refill  . cephALEXin (KEFLEX) 500 MG capsule Take 1 capsule (500 mg total) by mouth 2 (two) times daily.  14 capsule  0  . disopyramide (NORPACE) 150 MG capsule TAKE 1 CAPSULE TWICE A DAY  30 capsule  11  . losartan (COZAAR) 100 MG tablet Take 1 tablet (100 mg total) by mouth daily.  90 tablet  3  . metoprolol succinate (TOPROL-XL) 100 MG 24 hr tablet TAKE 1 TABLET BY MOUTH TWICE A DAY  180 tablet  1  . NIFEdipine (PROCARDIA XL/ADALAT-CC) 60 MG 24 hr tablet Take 1 tablet (60 mg total) by mouth daily.  90 tablet  3  . omeprazole (PRILOSEC) 20 MG capsule Take 1 capsule (20 mg total) by mouth daily.  90 capsule  3  . simvastatin (ZOCOR) 40 MG tablet Take 1 tablet (40 mg total) by mouth at bedtime.  45 tablet  3   No current facility-administered medications on file prior to visit.    Review of Systems:  As per HPI, otherwise negative.    Physical Examination: Filed Vitals:   09/20/13 1508  BP: 138/88  Pulse: 66  Temp: 97.5 F (36.4 C)  Resp: 16   Filed Vitals:   09/20/13 1508  Height: 5\' 5"  (1.651 m)  Weight: 164 lb (74.39 kg)   Body mass index is 27.29 kg/(m^2). Ideal Body Weight: Weight in (lb) to have BMI = 25:  149.9  GEN: WDWN, NAD, Non-toxic, A & O x 3 HEENT: Atraumatic, Normocephalic. Neck supple. No masses, No LAD. Ears and Nose: No external deformity. CV: RRR, No M/G/R. No JVD. No thrill. No extra heart sounds. PULM: CTA B, no wheezes, crackles, rhonchi. No retractions. No resp. distress. No accessory muscle use. ABD: S, NT, ND, +BS. No rebound. No HSM. EXTR: No c/c/e NEURO Normal gait.  PSYCH: Normally interactive. Conversant. Not depressed or anxious appearing.  Calm demeanor.    Assessment and Plan: Cystitis Sciatica Anaprox Septra  Signed,  Ellison Carwin, MD   Results for orders placed in visit on 09/20/13  POCT URINALYSIS DIPSTICK      Result Value Range   Color, UA orange     Clarity, UA cloudy      Glucose, UA neg     Bilirubin, UA neg     Ketones, UA neg     Spec Grav, UA 1.020     Blood, UA small     pH, UA 5.5     Protein, UA 30     Urobilinogen, UA 0.2     Nitrite, UA pos     Leukocytes, UA moderate (2+)    POCT UA - MICROSCOPIC ONLY      Result Value Range   WBC, Ur, HPF, POC tntc     RBC, urine, microscopic tntc     Bacteria, U Microscopic 1+     Mucus, UA neg     Epithelial cells, urine per micros 0-3     Crystals, Ur, HPF, POC neg     Casts, Ur, LPF, POC neg     Yeast, UA pos

## 2013-10-30 ENCOUNTER — Other Ambulatory Visit: Payer: Self-pay | Admitting: Internal Medicine

## 2013-11-06 ENCOUNTER — Encounter: Payer: Self-pay | Admitting: Internal Medicine

## 2013-11-06 ENCOUNTER — Ambulatory Visit (INDEPENDENT_AMBULATORY_CARE_PROVIDER_SITE_OTHER): Payer: 59 | Admitting: Internal Medicine

## 2013-11-06 VITALS — BP 122/88 | HR 71 | Temp 97.8°F | Wt 163.8 lb

## 2013-11-06 DIAGNOSIS — R0789 Other chest pain: Secondary | ICD-10-CM

## 2013-11-06 DIAGNOSIS — R51 Headache: Secondary | ICD-10-CM

## 2013-11-06 NOTE — Progress Notes (Signed)
Pre visit review using our clinic review tool, if applicable. No additional management support is needed unless otherwise documented below in the visit note. 

## 2013-11-06 NOTE — Patient Instructions (Signed)
Headache over the right eye - this may be discomfort from a mild concussion from the fall or it may be some sinus congestion. There is no evidence to suggest a hemorrhage or blood clot thus there is no need for brain imaging, i.e. CT scan.  Plan Ok to use ibuprofen for the pain - watch for stomach irritation  For worsening pain, blurred or double vision, projectile vomiting, inability to find works or confusion - CALL  Chest pain - your symptoms sound more like a GI type discomfort, especially if it is better with Prilosec. If this becomes a frequent problem please take prilosec or zantac every day.    Concussion, Adult A concussion, or closed-head injury, is a brain injury caused by a direct blow to the head or by a quick and sudden movement (jolt) of the head or neck. Concussions are usually not life-threatening. Even so, the effects of a concussion can be serious. If you have had a concussion before, you are more likely to experience concussion-like symptoms after a direct blow to the head.  CAUSES   Direct blow to the head, such as from running into another player during a soccer game, being hit in a fight, or hitting your head on a hard surface.  A jolt of the head or neck that causes the brain to move back and forth inside the skull, such as in a car crash. SIGNS AND SYMPTOMS  The signs of a concussion can be hard to notice. Early on, they may be missed by you, family members, and health care providers. You may look fine but act or feel differently. Symptoms are usually temporary, but they may last for days, weeks, or even longer. Some symptoms may appear right away while others may not show up for hours or days. Every head injury is different. Symptoms include:   Mild to moderate headaches that will not go away.  A feeling of pressure inside your head.  Having more trouble than usual:   Learning or remembering things you have heard.  Answering questions.  Paying attention or  concentrating.   Organizing daily tasks.   Making decisions and solving problems.   Slowness in thinking, acting or reacting, speaking, or reading.   Getting lost or being easily confused.   Feeling tired all the time or lacking energy (fatigued).   Feeling drowsy.   Sleep disturbances.   Sleeping more than usual.   Sleeping less than usual.   Trouble falling asleep.   Trouble sleeping (insomnia).   Loss of balance or feeling lightheaded or dizzy.   Nausea or vomiting.   Numbness or tingling.   Increased sensitivity to:   Sounds.   Lights.   Distractions.   Vision problems or eyes that tire easily.   Diminished sense of taste or smell.   Ringing in the ears.   Mood changes such as feeling sad or anxious.   Becoming easily irritated or angry for little or no reason.   Lack of motivation.  Seeing or hearing things other people do not see or hear (hallucinations). DIAGNOSIS  Your health care provider can usually diagnose a concussion based on a description of your injury and symptoms. He or she will ask whether you passed out (lost consciousness) and whether you are having trouble remembering events that happened right before and during your injury.  Your evaluation might include:   A brain scan to look for signs of injury to the brain. Even if the test shows no  injury, you may still have a concussion.   Blood tests to be sure other problems are not present. TREATMENT   Concussions are usually treated in an emergency department, in urgent care, or at a clinic. You may need to stay in the hospital overnight for further treatment.   Tell your health care provider if you are taking any medicines, including prescription medicines, over-the-counter medicines, and natural remedies. Some medicines, such as blood thinners (anticoagulants) and aspirin, may increase the chance of complications. Also tell your health care provider whether you  have had alcohol or are taking illegal drugs. This information may affect treatment.  Your health care provider will send you home with important instructions to follow.  How fast you will recover from a concussion depends on many factors. These factors include how severe your concussion is, what part of your brain was injured, your age, and how healthy you were before the concussion.  Most people with mild injuries recover fully. Recovery can take time. In general, recovery is slower in older persons. Also, persons who have had a concussion in the past or have other medical problems may find that it takes longer to recover from their current injury. HOME CARE INSTRUCTIONS  General Instructions  Carefully follow the directions your health care provider gave you.  Only take over-the-counter or prescription medicines for pain, discomfort, or fever as directed by your health care provider.  Take only those medicines that your health care provider has approved.  Do not drink alcohol until your health care provider says you are well enough to do so. Alcohol and certain other drugs may slow your recovery and can put you at risk of further injury.  If it is harder than usual to remember things, write them down.  If you are easily distracted, try to do one thing at a time. For example, do not try to watch TV while fixing dinner.  Talk with family members or close friends when making important decisions.  Keep all follow-up appointments. Repeated evaluation of your symptoms is recommended for your recovery.  Watch your symptoms and tell others to do the same. Complications sometimes occur after a concussion. Older adults with a brain injury may have a higher risk of serious complications such as of a blood clot on the brain.  Tell your teachers, school nurse, school counselor, coach, athletic trainer, or work Freight forwarder about your injury, symptoms, and restrictions. Tell them about what you can or  cannot do. They should watch for:   Increased problems with attention or concentration.   Increased difficulty remembering or learning new information.   Increased time needed to complete tasks or assignments.   Increased irritability or decreased ability to cope with stress.   Increased symptoms.   Rest. Rest helps the brain to heal. Make sure you:  Get plenty of sleep at night. Avoid staying up late at night.  Keep the same bedtime hours on weekends and weekdays.  Rest during the day. Take daytime naps or rest breaks when you feel tired.  Limit activities that require a lot of thought or concentration. These includes   Doing homework or job-related work.   Watching TV.   Working on the computer.  Avoid any situation where there is potential for another head injury (football, hockey, soccer, basketball, martial arts, downhill snow sports and horseback riding). Your condition will get worse every time you experience a concussion. You should avoid these activities until you are evaluated by the appropriate follow-up caregivers.  Returning To Your Regular Activities You will need to return to your normal activities slowly, not all at once. You must give your body and brain enough time for recovery.  Do not return to sports or other athletic activities until your health care provider tells you it is safe to do so.  Ask your health care provider when you can drive, ride a bicycle, or operate heavy machinery. Your ability to react may be slower after a brain injury. Never do these activities if you are dizzy.  Ask your health care provider about when you can return to work or school. Preventing Another Concussion It is very important to avoid another brain injury, especially before you have recovered. In rare cases, another injury can lead to permanent brain damage, brain swelling, or death. The risk of this is greatest during the first 7 10 days after a head injury. Avoid  injuries by:   Wearing a seat belt when riding in a car.   Drinking alcohol only in moderation.   Wearing a helmet when biking, skiing, skateboarding, skating, or doing similar activities.  Avoiding activities that could lead to a second concussion, such as contact or recreational sports, until your health care provider says it is OK.  Taking safety measures in your home.   Remove clutter and tripping hazards from floors and stairways.   Use grab bars in bathrooms and handrails by stairs.   Place non-slip mats on floors and in bathtubs.   Improve lighting in dim areas. SEEK MEDICAL CARE IF:   You have increased problems paying attention or concentrating.   You have increased difficulty remembering or learning new information.   You need more time to complete tasks or assignments than before.   You have increased irritability or decreased ability to cope with stress.  You have more symptoms than before. Seek medical care if you have any of the following symptoms for more than 2 weeks after your injury:   Lasting (chronic) headaches.   Dizziness or balance problems.   Nausea.  Vision problems.   Increased sensitivity to noise or light.   Depression or mood swings.   Anxiety or irritability.   Memory problems.   Difficulty concentrating or paying attention.   Sleep problems.   Feeling tired all the time. SEEK IMMEDIATE MEDICAL CARE IF:   You have severe or worsening headaches. These may be a sign of a blood clot in the brain.  You have weakness (even if only in one hand, leg, or part of the face).  You have numbness.  You have decreased coordination.   You vomit repeatedly.  You have increased sleepiness.  One pupil is larger than the other.   You have convulsions.   You have slurred speech.   You have increased confusion. This may be a sign of a blood clot in the brain.  You have increased restlessness, agitation, or  irritability.   You are unable to recognize people or places.   You have neck pain.   It is difficult to wake you up.   You have unusual behavior changes.   You lose consciousness. MAKE SURE YOU:   Understand these instructions.  Will watch your condition.  Will get help right away if you are not doing well or get worse. Document Released: 11/12/2003 Document Revised: 04/24/2013 Document Reviewed: 03/14/2013 [facility] Patient Information 2014 [place], [place].

## 2013-11-07 NOTE — Progress Notes (Signed)
Subjective:    Patient ID: Monique [name], female    DOB: 04-08-1936, 78 y.o.   MRN: [id number]  HPI Monique [name] presents for evaluation of headaches after a fall.  Last Thursday Monique [name] slipped on the ice and fell backwards striking the occiput. She had no loss of consciousness, no laceration. She was able to get up and has had normal function since. She has a h/o intermittent headaches. Since her fall she has had 3 moderate headaches, locating the pain over the right eye. The pain is not throbbing. She denies double vision, slurred speech, difficulty with word finding/apraxia, problems with balance or gait. Ibuprofen does relieve her pain.  Past Medical History  Diagnosis Date  . PAF (paroxysmal atrial fibrillation)   . LVH (left ventricular hypertrophy)   . Subarachnoid hemorrhage   . Hyperlipidemia   . HTN (hypertension)     Essential   Past Surgical History  Procedure Laterality Date  . Hysterectomy     History reviewed. No pertinent family history. History   Social History  . Marital Status: Single    Spouse Name: N/A    Number of Children: N/A  . Years of Education: N/A   Occupational History  . Not on file.   Social History Main Topics  . Smoking status: Former Research scientist (life sciences)  . Smokeless tobacco: Never Used  . Alcohol Use: No  . Drug Use: No  . Sexual Activity: Not Currently   Other Topics Concern  . Not on file   Social History Narrative      HSG. Married '61 - '88/divorced. 3 sons - '74, '59, '62, 1 daughter '63; 5 grandchildren. Lives alone.     Current Outpatient Prescriptions on File Prior to Visit  Medication Sig Dispense Refill  . disopyramide (NORPACE) 150 MG capsule TAKE 1 CAPSULE TWICE A DAY  60 capsule  1  . losartan (COZAAR) 100 MG tablet Take 1 tablet (100 mg total) by mouth daily.  90 tablet  3  . metoprolol succinate (TOPROL-XL) 100 MG 24 hr tablet TAKE 1 TABLET BY MOUTH TWICE A DAY  180 tablet  1  . NIFEdipine (PROCARDIA XL/ADALAT-CC) 60 MG 24  hr tablet Take 1 tablet (60 mg total) by mouth daily.  90 tablet  3  . omeprazole (PRILOSEC) 20 MG capsule Take 1 capsule (20 mg total) by mouth daily.  90 capsule  3  . simvastatin (ZOCOR) 40 MG tablet Take 1 tablet (40 mg total) by mouth at bedtime.  45 tablet  3   No current facility-administered medications on file prior to visit.      Review of Systems System review is negative for any constitutional, cardiac, pulmonary, GI or neuro symptoms or complaints other than as described in the HPI.     Objective:   Physical Exam Filed Vitals:   11/06/13 1613  BP: 122/88  Pulse: 71  Temp: 97.8 F (36.6 C)   BP Readings from Last 3 Encounters:  11/06/13 122/88  09/20/13 138/88  07/18/13 140/90   Gen'l- WNWD woman in no distress HEENT- no Battle's sign, no racoon's eyes, no hematoma at the occiput, mild tenderness to percussion over the right frontal and maxillary sinus. Cor- 2+ radial, RRR Pulm - normal respirations Neuro - A&O x 3, speech clear, cognition normal; CN II-XII normal - PERRLA, EOMI, normal gait.       Assessment & Plan:  1. Headache- Headache over the right eye - this may be discomfort from a mild  concussion from the fall or it may be some sinus congestion. There is no evidence to suggest a hemorrhage or blood clot thus there is no need for brain imaging, i.e. CT scan.  Plan Ok to use ibuprofen for the pain - watch for stomach irritation  For worsening pain, blurred or double vision, projectile vomiting, inability to find works or confusion - CALL  2. Atypical chest pain - Chest pain - your symptoms sound more like a GI type discomfort, especially if it is better with Prilosec. If this becomes a frequent problem please take prilosec or zantac every day.

## 2013-12-03 ENCOUNTER — Other Ambulatory Visit: Payer: Self-pay | Admitting: Internal Medicine

## 2013-12-16 ENCOUNTER — Other Ambulatory Visit: Payer: Self-pay | Admitting: Internal Medicine

## 2013-12-27 ENCOUNTER — Encounter: Payer: Self-pay | Admitting: Internal Medicine

## 2013-12-27 ENCOUNTER — Encounter: Payer: Self-pay | Admitting: *Deleted

## 2013-12-27 ENCOUNTER — Ambulatory Visit (INDEPENDENT_AMBULATORY_CARE_PROVIDER_SITE_OTHER): Payer: 59 | Admitting: Internal Medicine

## 2013-12-27 VITALS — BP 143/78 | HR 63 | Ht 67.0 in | Wt 167.0 lb

## 2013-12-27 DIAGNOSIS — R609 Edema, unspecified: Secondary | ICD-10-CM

## 2013-12-27 DIAGNOSIS — I4891 Unspecified atrial fibrillation: Secondary | ICD-10-CM

## 2013-12-27 LAB — CBC
HEMATOCRIT: 36.6 % (ref 36.0–46.0)
Hemoglobin: 12.7 g/dL (ref 12.0–15.0)
MCH: 30.9 pg (ref 26.0–34.0)
MCHC: 34.7 g/dL (ref 30.0–36.0)
MCV: 89.1 fL (ref 78.0–100.0)
PLATELETS: 213 10*3/uL (ref 150–400)
RBC: 4.11 MIL/uL (ref 3.87–5.11)
RDW: 13.8 % (ref 11.5–15.5)
WBC: 9.9 10*3/uL (ref 4.0–10.5)

## 2013-12-27 LAB — BASIC METABOLIC PANEL
BUN: 14 mg/dL (ref 6–23)
CHLORIDE: 104 meq/L (ref 96–112)
CO2: 28 meq/L (ref 19–32)
Calcium: 9.3 mg/dL (ref 8.4–10.5)
Creat: 1.18 mg/dL — ABNORMAL HIGH (ref 0.50–1.10)
Glucose, Bld: 81 mg/dL (ref 70–99)
POTASSIUM: 3.4 meq/L — AB (ref 3.5–5.3)
Sodium: 142 mEq/L (ref 135–145)

## 2013-12-27 LAB — BRAIN NATRIURETIC PEPTIDE: BRAIN NATRIURETIC PEPTIDE: 116 pg/mL — AB (ref 0.0–100.0)

## 2013-12-27 LAB — TSH: TSH: 1.817 u[IU]/mL (ref 0.350–4.500)

## 2013-12-27 MED ORDER — LOSARTAN POTASSIUM 100 MG PO TABS: 100.0000 mg | ORAL_TABLET | Freq: Every day | ORAL | Status: DC

## 2013-12-27 MED ORDER — DISOPYRAMIDE PHOSPHATE 150 MG PO CAPS: 150.0000 mg | ORAL_CAPSULE | Freq: Two times a day (BID) | ORAL | Status: DC

## 2013-12-27 NOTE — Progress Notes (Signed)
HPI Monique [name] is a 78 y.o. Female with  a history of hypertrophic cardiomyopathy, hypertension, hyperlipidemia, paroxysmal atrial fibrillation and prior subarachnoid hemorrhage. She is not on Coumadin secondary to prior subarachnoid hemorrhage. Nuclear study 12/02: No scar or ischemia, EF 71%. LHC 9/09: EF 60%, no CAD, no LVOT gradient. Echocardiogram 10/05: Moderate LVH, moderate intracavitary gradient, grade 3 diastolic dysfunction, no MR.   The patient says she developed edema in legs on Wed  Denies diet changes.   No SOB  No dizziness  No palpitaoitns.  No CP No Known Allergies  Current Outpatient Prescriptions  Medication Sig Dispense Refill  . disopyramide (NORPACE) 150 MG capsule Take 1 capsule (150 mg total) by mouth 2 (two) times daily.  180 capsule  3  . losartan (COZAAR) 100 MG tablet Take 1 tablet (100 mg total) by mouth daily.  90 tablet  3  . metoprolol succinate (TOPROL-XL) 100 MG 24 hr tablet TAKE 1 TABLET BY MOUTH TWICE A DAY  180 tablet  0  . NIFEdipine (PROCARDIA XL/ADALAT-CC) 60 MG 24 hr tablet TAKE 1 TABLET (60 MG TOTAL) BY MOUTH DAILY.  90 tablet  0  . omeprazole (PRILOSEC) 20 MG capsule Take 1 capsule (20 mg total) by mouth daily.  90 capsule  3  . simvastatin (ZOCOR) 40 MG tablet Take 1 tablet (40 mg total) by mouth at bedtime.  45 tablet  3   No current facility-administered medications for this visit.    Past Medical History  Diagnosis Date  . PAF (paroxysmal atrial fibrillation)   . LVH (left ventricular hypertrophy)   . Subarachnoid hemorrhage   . Hyperlipidemia   . HTN (hypertension)     Essential    Past Surgical History  Procedure Laterality Date  . Hysterectomy      No family history on file.  History   Social History  . Marital Status: Single    Spouse Name: N/A    Number of Children: N/A  . Years of Education: N/A   Occupational History  . Not on file.   Social History Main Topics  . Smoking status: Former Research scientist (life sciences)  . Smokeless  tobacco: Never Used  . Alcohol Use: No  . Drug Use: No  . Sexual Activity: Not Currently   Other Topics Concern  . Not on file   Social History Narrative      HSG. Married '61 - '88/divorced. 3 sons - '74, '59, '62, 1 daughter '63; 5 grandchildren. Lives alone.     Review of Systems:  All systems reviewed.  They are negative to the above problem except as previously stated.  Vital Signs: BP 143/78  Pulse 63  Ht 5\' 7"  (1.702 m)  Wt 167 lb (75.751 kg)  BMI 26.15 kg/m2  Physical Exam Patient is in AND HEENT:  Normocephalic, atraumatic. EOMI, PERRLA.  Neck: JVP is normal.  No bruits.  Lungs: clear to auscultation. No rales no wheezes.  Heart: Regular rate and rhythm. Normal S1, S2. No S3.   II/VI Systolic murmur base PMI not displaced.  Abdomen:  Supple, nontender. Normal bowel sounds. No masses. No hepatomegaly.  Extremities:   Good distal pulses throughout. 1+ lower extremity edema.  Musculoskeletal :moving all extremities.  Neuro:   alert and oriented x3.  CN II-XII grossly intact.   Assessment and Plan:  1.  HCM  Will get echo to evaluate LVEF and LV anatomy    2.  Edema  Labs, echo   3.  HTN  Adequate control  4.  PAF  No symptoms to suggest recurrence.  Not an anticoag candidate

## 2013-12-27 NOTE — Patient Instructions (Signed)
Your physician has requested that you have an echocardiogram. Echocardiography is a painless test that uses sound waves to create images of your heart. It provides your doctor with information about the size and shape of your heart and how well your heart's chambers and valves are working. This procedure takes approximately one hour. There are no restrictions for this procedure.   Your physician recommends that you HAVE LAB WORK TODAY

## 2013-12-30 ENCOUNTER — Telehealth: Payer: Self-pay | Admitting: *Deleted

## 2013-12-30 NOTE — Telephone Encounter (Signed)
Patient is returning your call. Please call back.  °

## 2013-12-30 NOTE — Telephone Encounter (Signed)
pt aware of results  Patient voiced understanding to increase potassium rich foods

## 2013-12-30 NOTE — Telephone Encounter (Signed)
Message from dr Harrington Challenger     CBC is normal.    BMET shows K is a little low She is not on a diuretic. Drink OJ      Left message for pt to call

## 2014-01-14 ENCOUNTER — Ambulatory Visit (HOSPITAL_COMMUNITY): Payer: 59 | Attending: Internal Medicine | Admitting: Radiology

## 2014-01-14 DIAGNOSIS — I4891 Unspecified atrial fibrillation: Secondary | ICD-10-CM | POA: Insufficient documentation

## 2014-01-14 DIAGNOSIS — I428 Other cardiomyopathies: Secondary | ICD-10-CM

## 2014-01-14 DIAGNOSIS — R011 Cardiac murmur, unspecified: Secondary | ICD-10-CM

## 2014-01-14 DIAGNOSIS — R609 Edema, unspecified: Secondary | ICD-10-CM

## 2014-01-14 NOTE — Progress Notes (Signed)
Echocardiogram performed.  

## 2014-01-17 ENCOUNTER — Encounter: Payer: Self-pay | Admitting: Internal Medicine

## 2014-01-17 NOTE — Telephone Encounter (Signed)
New message ° ° ° °Returning Debra's call °

## 2014-01-17 NOTE — Telephone Encounter (Signed)
This encounter was created in error - please disregard.

## 2014-03-03 ENCOUNTER — Other Ambulatory Visit: Payer: Self-pay | Admitting: Internal Medicine

## 2014-03-13 ENCOUNTER — Encounter: Payer: Self-pay | Admitting: Internal Medicine

## 2014-03-13 ENCOUNTER — Ambulatory Visit (INDEPENDENT_AMBULATORY_CARE_PROVIDER_SITE_OTHER): Payer: 59 | Admitting: Internal Medicine

## 2014-03-13 VITALS — BP 138/86 | HR 60 | Temp 97.7°F | Ht 66.0 in | Wt 165.0 lb

## 2014-03-13 DIAGNOSIS — R51 Headache: Secondary | ICD-10-CM

## 2014-03-13 DIAGNOSIS — E785 Hyperlipidemia, unspecified: Secondary | ICD-10-CM

## 2014-03-13 DIAGNOSIS — I10 Essential (primary) hypertension: Secondary | ICD-10-CM

## 2014-03-13 NOTE — Progress Notes (Signed)
   Subjective:    Patient ID: Monique [name], female    DOB: [birth year], 78 y.o.   MRN: [id number]  HPI  Here with acute onset HA left > right, dull but some tender to palpate as well, no scalp red, swelling or drainage, mod to severe this am, intermittent but similar to pain with 2008 SAH episode, not on anticoag at this time, no recent trauma. Denies vision change, and Pt denies new neurological symptoms such as facial or extremity weakness or numbness. Pt denies chest pain, increased sob or doe, wheezing, orthopnea, PND, increased LE swelling, palpitations, dizziness or syncope.   Past Medical History  Diagnosis Date  . PAF (paroxysmal atrial fibrillation)   . LVH (left ventricular hypertrophy)   . Subarachnoid hemorrhage   . Hyperlipidemia   . HTN (hypertension)     Essential   Past Surgical History  Procedure Laterality Date  . Hysterectomy      reports that she has quit smoking. She has never used smokeless tobacco. She reports that she does not drink alcohol or use illicit drugs. family history is not on file. No Known Allergies Current Outpatient Prescriptions on File Prior to Visit  Medication Sig Dispense Refill  . disopyramide (NORPACE) 150 MG capsule Take 1 capsule (150 mg total) by mouth 2 (two) times daily.  180 capsule  3  . losartan (COZAAR) 100 MG tablet Take 1 tablet (100 mg total) by mouth daily.  90 tablet  3  . metoprolol succinate (TOPROL-XL) 100 MG 24 hr tablet TAKE 1 TABLET BY MOUTH TWICE A DAY  180 tablet  0  . NIFEdipine (PROCARDIA XL/ADALAT-CC) 60 MG 24 hr tablet TAKE 1 TABLET (60 MG TOTAL) BY MOUTH DAILY.  90 tablet  0  . omeprazole (PRILOSEC) 20 MG capsule Take 1 capsule (20 mg total) by mouth daily.  90 capsule  3  . simvastatin (ZOCOR) 40 MG tablet TAKE 1 TABLET (40 MG TOTAL) BY MOUTH AT BEDTIME.  30 tablet  3   No current facility-administered medications on file prior to visit.   Review of Systems  Constitutional: Negative for unusual diaphoresis or  other sweats  HENT: Negative for ringing in ear Eyes: Negative for double vision or worsening visual disturbance.  Respiratory: Negative for choking and stridor.   Gastrointestinal: Negative for vomiting or other signifcant bowel change Genitourinary: Negative for hematuria or decreased urine volume.  Musculoskeletal: Negative for other MSK pain or swelling Skin: Negative for color change and worsening wound.  Neurological: Negative for tremors and numbness other than noted  Psychiatric/Behavioral: Negative for decreased concentration or agitation other than above  ;    Objective:   Physical Exam BP 138/86  Pulse 60  Temp(Src) 97.7 F (36.5 C) (Oral)  Ht 5\' 6"  (1.676 m)  Wt 165 lb (74.844 kg)  BMI 26.64 kg/m2  SpO2 96% VS noted, fatigued appearing Constitutional: Pt appears well-developed, well-nourished.  HENT: Head: NCAT.  Right Ear: External ear normal.  Left Ear: External ear normal.  Eyes: . Pupils are equal, round, and reactive to light. Conjunctivae and EOM are normal Neck: Normal range of motion. Neck supple.  Cardiovascular: Normal rate and regular rhythm.   Pulmonary/Chest: Effort normal and breath sounds normal.  Neurological: Pt is alert. Not confused , motor grossly intact, cn 2-12 intact, no neck stiffness Skin: Skin is warm. No rash or swelling Psychiatric: Pt behavior is normal. No agitation.        Assessment & Plan:

## 2014-03-13 NOTE — Progress Notes (Signed)
Pre visit review using our clinic review tool, if applicable. No additional management support is needed unless otherwise documented below in the visit note. 

## 2014-03-13 NOTE — Patient Instructions (Addendum)
Please continue all other medications as before, and refills have been done if requested.  Please have the pharmacy call with any other refills you may need.  Please keep your appointments with your specialists as you may have planned  You will be contacted regarding the referral for: Head CT asap

## 2014-03-16 NOTE — Assessment & Plan Note (Signed)
?   Benign vs other HA, given hx will need head CT no CM to r/o bleed despite neuro exam no change,  to f/u any worsening symptoms or concerns

## 2014-03-16 NOTE — Assessment & Plan Note (Signed)
D/w pt,  Lab Results  Component Value Date   LDLCALC 85 12/24/2012   For cont'd low chol diet, f/u with new PCP soon, cont same tx

## 2014-03-16 NOTE — Assessment & Plan Note (Signed)
stable overall by history and exam, recent data reviewed with pt, and pt to continue medical treatment as before,  to f/u any worsening symptoms or concerns BP Readings from Last 3 Encounters:  03/13/14 138/86  12/27/13 143/78  11/06/13 122/88

## 2014-03-19 ENCOUNTER — Other Ambulatory Visit: Payer: 59

## 2014-03-19 ENCOUNTER — Other Ambulatory Visit: Payer: Self-pay | Admitting: Internal Medicine

## 2014-03-20 ENCOUNTER — Ambulatory Visit (INDEPENDENT_AMBULATORY_CARE_PROVIDER_SITE_OTHER)
Admission: RE | Admit: 2014-03-20 | Discharge: 2014-03-20 | Disposition: A | Discharge status: Home / Self Care | Payer: 59 | Source: Ambulatory Visit | Attending: Internal Medicine | Admitting: Internal Medicine

## 2014-03-20 DIAGNOSIS — R51 Headache: Secondary | ICD-10-CM

## 2014-04-04 ENCOUNTER — Encounter: Payer: Self-pay | Admitting: Internal Medicine

## 2014-04-04 ENCOUNTER — Other Ambulatory Visit (INDEPENDENT_AMBULATORY_CARE_PROVIDER_SITE_OTHER): Payer: 59

## 2014-04-04 ENCOUNTER — Ambulatory Visit (INDEPENDENT_AMBULATORY_CARE_PROVIDER_SITE_OTHER): Payer: 59 | Admitting: Internal Medicine

## 2014-04-04 VITALS — BP 132/84 | HR 64 | Temp 98.1°F | Wt 162.1 lb

## 2014-04-04 DIAGNOSIS — R1030 Lower abdominal pain, unspecified: Secondary | ICD-10-CM

## 2014-04-04 DIAGNOSIS — R109 Unspecified abdominal pain: Secondary | ICD-10-CM

## 2014-04-04 DIAGNOSIS — R10A1 Flank pain, right side: Secondary | ICD-10-CM

## 2014-04-04 DIAGNOSIS — R3915 Urgency of urination: Secondary | ICD-10-CM

## 2014-04-04 DIAGNOSIS — Z87442 Personal history of urinary calculi: Secondary | ICD-10-CM

## 2014-04-04 LAB — CBC WITH DIFFERENTIAL/PLATELET
BASOS ABS: 0 10*3/uL (ref 0.0–0.1)
Basophils Relative: 0.4 % (ref 0.0–3.0)
Eosinophils Absolute: 0.1 10*3/uL (ref 0.0–0.7)
Eosinophils Relative: 1.5 % (ref 0.0–5.0)
HEMATOCRIT: 40.9 % (ref 36.0–46.0)
Hemoglobin: 13.4 g/dL (ref 12.0–15.0)
LYMPHS ABS: 4.8 10*3/uL — AB (ref 0.7–4.0)
Lymphocytes Relative: 49.6 % — ABNORMAL HIGH (ref 12.0–46.0)
MCHC: 32.7 g/dL (ref 30.0–36.0)
MCV: 95.8 fl (ref 78.0–100.0)
Monocytes Absolute: 0.7 10*3/uL (ref 0.1–1.0)
Monocytes Relative: 6.8 % (ref 3.0–12.0)
Neutro Abs: 4 10*3/uL (ref 1.4–7.7)
Neutrophils Relative %: 41.7 % — ABNORMAL LOW (ref 43.0–77.0)
PLATELETS: 181 10*3/uL (ref 150.0–400.0)
RBC: 4.27 Mil/uL (ref 3.87–5.11)
RDW: 13.7 % (ref 11.5–15.5)
WBC: 9.6 10*3/uL (ref 4.0–10.5)

## 2014-04-04 LAB — URINALYSIS, ROUTINE W REFLEX MICROSCOPIC
KETONES UR: NEGATIVE
Nitrite: NEGATIVE
RBC / HPF: NONE SEEN (ref 0–?)
Specific Gravity, Urine: 1.025 (ref 1.000–1.030)
URINE GLUCOSE: NEGATIVE
UROBILINOGEN UA: 0.2 (ref 0.0–1.0)
pH: 5.5 (ref 5.0–8.0)

## 2014-04-04 MED ORDER — TRAMADOL HCL 50 MG PO TABS: 50.0000 mg | ORAL_TABLET | Freq: Four times a day (QID) | ORAL | Status: DC | PRN

## 2014-04-04 NOTE — Progress Notes (Signed)
Pre visit review using our clinic review tool, if applicable. No additional management support is needed unless otherwise documented below in the visit note. 

## 2014-04-04 NOTE — Progress Notes (Signed)
   Subjective:    Patient ID: Monique [name], female    DOB: March 29, 1936, 78 y.o.   MRN: [id number]  HPI   She began to have right flank pain 2 weeks ago which she describes as dull, up to a level VIII. This did improve with nonsteroidals and heat. The been no trigger or injury such as repetitive motion as factors prior to the onset of the pain  Approximately  7/26 she began to experience dull pain up to level IX in the suprapubic area. This did not respond to nonsteroidals.  She also describes intermittent urgency with associated anuria.  Significant history reveals that she fell in February this year and was seen in urgent care. X-rays revealed renal calculi.  Her last colonoscopy was 16 years ago. She has a history of colitis. PMH of TAH.      Review of Systems  She denies nausea, vomiting, constipation, diarrhea, or melena.  She has no dysuria, pyuria, hematuria.  She does have dyspepsia which was present before she took the nonsteroidals.  She has no anorexia, hematemesis, or weight change  No associated fever, chills, or sweats      Objective:   Physical Exam Significant or distinguishing  findings on physical exam include: Appears younger than stated age Arcus senilis is present She has an upper plate & lower partial There's a keratotic lesion of the right lateral cheek. There is accentuation of the upper thoracic curve She has tenderness to percussion over the right flank. She does have some tenderness to palpation in the suprapubic area.  General appearance :adequately nourished; in no distress. Eyes: No conjunctival inflammation or scleral icterus is present. Oral exam: Lips and gums are healthy appearing.There is no oropharyngeal erythema or exudate noted.  Heart:  Normal rate and regular rhythm. S1 and S2 normal without gallop, murmur, click, rub or other extra sounds   Lungs:Chest clear to auscultation; no wheezes, rhonchi,rales ,or rubs present.No  increased work of breathing.  Abdomen: bowel sounds normal, soft  without masses, organomegaly or hernias noted.  No guarding or rebound.  Skin:Warm & dry.  Intact without suspicious lesions or rashes ; no jaundice or tenting Lymphatic: No lymphadenopathy is noted about the head, neck, axilla            Assessment & Plan:  #1 right flank and suprapubic pain in the context of history of renal calculi  Plan: She'll be asked to strain the urine. See orders and recommendations.

## 2014-04-04 NOTE — Patient Instructions (Addendum)
Drink as much nondairy fluids as possible. Avoid spicy foods or alcohol as  these may aggravate the bladder. Do not take decongestants. Avoid narcotics if possible.  Strain urine to catch any kidney stones passed; strainer can be purchased @ pharmacy.

## 2014-04-05 LAB — URINE CULTURE

## 2014-04-08 ENCOUNTER — Telehealth: Payer: Self-pay

## 2014-04-08 NOTE — Telephone Encounter (Signed)
Received a fax from [id number] stating Tramadol needs prior authorization. I called the help desk for the prior authorization and was advised since this is workers comp the pharmacy has to initiate prior authorization. I called [facility] to let them know and I was advised once their system is back up and running they will take care of the prior authorization.   Per Dr Linna Darner Tramadol is needed for acute flank and suprapubic pain, possible renal calculi

## 2014-04-17 ENCOUNTER — Other Ambulatory Visit: Payer: Self-pay | Admitting: Internal Medicine

## 2014-06-05 DIAGNOSIS — I2699 Other pulmonary embolism without acute cor pulmonale: Secondary | ICD-10-CM

## 2014-06-05 DIAGNOSIS — Z7901 Long term (current) use of anticoagulants: Secondary | ICD-10-CM

## 2014-06-05 HISTORY — DX: Other pulmonary embolism without acute cor pulmonale: I26.99

## 2014-06-05 HISTORY — DX: Long term (current) use of anticoagulants: Z79.01

## 2014-06-23 ENCOUNTER — Other Ambulatory Visit: Payer: Self-pay | Admitting: Internal Medicine

## 2014-06-26 ENCOUNTER — Telehealth: Payer: Self-pay | Admitting: Internal Medicine

## 2014-06-26 NOTE — Progress Notes (Signed)
Patient admitted to [facility].   This encounter was created in error - please disregard.

## 2014-06-26 NOTE — Telephone Encounter (Signed)
New message          C/o sob / pt would like to be seen asap

## 2014-06-26 NOTE — Telephone Encounter (Signed)
Appointment given with Dr, Harrington Challenger on Friday 10/23 at 8:45 am.

## 2014-06-26 NOTE — Telephone Encounter (Signed)
Patient calls today, feeling SOB for the past few weeks. This occurs with any mild exertion. No SOB or chest pain

## 2014-06-27 ENCOUNTER — Other Ambulatory Visit: Payer: Self-pay

## 2014-06-27 ENCOUNTER — Emergency Department (HOSPITAL_COMMUNITY): Payer: 59

## 2014-06-27 ENCOUNTER — Encounter: Payer: Self-pay | Admitting: Internal Medicine

## 2014-06-27 ENCOUNTER — Inpatient Hospital Stay (HOSPITAL_COMMUNITY)
Admission: EM | Admit: 2014-06-27 | Discharge: 2014-07-01 | DRG: 175 | Disposition: A | Discharge status: Home Health | Payer: 59 | Attending: Internal Medicine | Admitting: Internal Medicine

## 2014-06-27 ENCOUNTER — Encounter (HOSPITAL_COMMUNITY): Payer: Self-pay | Admitting: Emergency Medicine

## 2014-06-27 DIAGNOSIS — E785 Hyperlipidemia, unspecified: Secondary | ICD-10-CM | POA: Diagnosis present

## 2014-06-27 DIAGNOSIS — M7989 Other specified soft tissue disorders: Secondary | ICD-10-CM

## 2014-06-27 DIAGNOSIS — I671 Cerebral aneurysm, nonruptured: Secondary | ICD-10-CM

## 2014-06-27 DIAGNOSIS — I421 Obstructive hypertrophic cardiomyopathy: Secondary | ICD-10-CM | POA: Diagnosis present

## 2014-06-27 DIAGNOSIS — I2699 Other pulmonary embolism without acute cor pulmonale: Secondary | ICD-10-CM

## 2014-06-27 DIAGNOSIS — I129 Hypertensive chronic kidney disease with stage 1 through stage 4 chronic kidney disease, or unspecified chronic kidney disease: Secondary | ICD-10-CM | POA: Diagnosis present

## 2014-06-27 DIAGNOSIS — Z23 Encounter for immunization: Secondary | ICD-10-CM | POA: Diagnosis not present

## 2014-06-27 DIAGNOSIS — Z8679 Personal history of other diseases of the circulatory system: Secondary | ICD-10-CM | POA: Diagnosis not present

## 2014-06-27 DIAGNOSIS — I480 Paroxysmal atrial fibrillation: Secondary | ICD-10-CM | POA: Diagnosis present

## 2014-06-27 DIAGNOSIS — I82411 Acute embolism and thrombosis of right femoral vein: Secondary | ICD-10-CM | POA: Diagnosis present

## 2014-06-27 DIAGNOSIS — J9601 Acute respiratory failure with hypoxia: Secondary | ICD-10-CM | POA: Diagnosis present

## 2014-06-27 DIAGNOSIS — I10 Essential (primary) hypertension: Secondary | ICD-10-CM | POA: Diagnosis present

## 2014-06-27 DIAGNOSIS — Z8739 Personal history of other diseases of the musculoskeletal system and connective tissue: Secondary | ICD-10-CM

## 2014-06-27 DIAGNOSIS — N183 Chronic kidney disease, stage 3 (moderate): Secondary | ICD-10-CM | POA: Diagnosis present

## 2014-06-27 DIAGNOSIS — R0600 Dyspnea, unspecified: Secondary | ICD-10-CM

## 2014-06-27 DIAGNOSIS — I4891 Unspecified atrial fibrillation: Secondary | ICD-10-CM | POA: Diagnosis present

## 2014-06-27 DIAGNOSIS — Z87891 Personal history of nicotine dependence: Secondary | ICD-10-CM

## 2014-06-27 DIAGNOSIS — R0602 Shortness of breath: Secondary | ICD-10-CM

## 2014-06-27 DIAGNOSIS — K2970 Gastritis, unspecified, without bleeding: Secondary | ICD-10-CM

## 2014-06-27 DIAGNOSIS — I609 Nontraumatic subarachnoid hemorrhage, unspecified: Secondary | ICD-10-CM

## 2014-06-27 DIAGNOSIS — R0902 Hypoxemia: Secondary | ICD-10-CM

## 2014-06-27 DIAGNOSIS — I369 Nonrheumatic tricuspid valve disorder, unspecified: Secondary | ICD-10-CM

## 2014-06-27 DIAGNOSIS — R7989 Other specified abnormal findings of blood chemistry: Secondary | ICD-10-CM

## 2014-06-27 DIAGNOSIS — M79609 Pain in unspecified limb: Secondary | ICD-10-CM

## 2014-06-27 HISTORY — DX: Cerebral aneurysm, nonruptured: I67.1

## 2014-06-27 HISTORY — DX: Other pulmonary embolism without acute cor pulmonale: I26.99

## 2014-06-27 HISTORY — DX: Acute respiratory failure with hypoxia: J96.01

## 2014-06-27 HISTORY — DX: Shortness of breath: R06.02

## 2014-06-27 HISTORY — DX: Personal history of other diseases of the musculoskeletal system and connective tissue: Z87.39

## 2014-06-27 LAB — BLOOD GAS, ARTERIAL
Acid-Base Excess: 0.5 mmol/L (ref 0.0–2.0)
BICARBONATE: 22.9 meq/L (ref 20.0–24.0)
Drawn by: 235321
FIO2: 1 %
O2 Saturation: 95.5 %
Patient temperature: 98.4
TCO2: 19.8 mmol/L (ref 0–100)
pCO2 arterial: 31.4 mmHg — ABNORMAL LOW (ref 35.0–45.0)
pH, Arterial: 7.475 — ABNORMAL HIGH (ref 7.350–7.450)
pO2, Arterial: 74.6 mmHg — ABNORMAL LOW (ref 80.0–100.0)

## 2014-06-27 LAB — COMPREHENSIVE METABOLIC PANEL
ALBUMIN: 3.8 g/dL (ref 3.5–5.2)
ALT: 21 U/L (ref 0–35)
AST: 40 U/L — ABNORMAL HIGH (ref 0–37)
Alkaline Phosphatase: 100 U/L (ref 39–117)
Anion gap: 15 (ref 5–15)
BUN: 20 mg/dL (ref 6–23)
CALCIUM: 9.8 mg/dL (ref 8.4–10.5)
CO2: 23 mEq/L (ref 19–32)
CREATININE: 1.3 mg/dL — AB (ref 0.50–1.10)
Chloride: 106 mEq/L (ref 96–112)
GFR calc non Af Amer: 39 mL/min — ABNORMAL LOW (ref >90)
GFR, EST AFRICAN AMERICAN: 45 mL/min — AB (ref >90)
GLUCOSE: 214 mg/dL — AB (ref 70–99)
POTASSIUM: 4.1 meq/L (ref 3.7–5.3)
Sodium: 144 mEq/L (ref 137–147)
Total Bilirubin: 0.7 mg/dL (ref 0.3–1.2)
Total Protein: 7.7 g/dL (ref 6.0–8.3)

## 2014-06-27 LAB — CBC WITH DIFFERENTIAL/PLATELET
BASOS PCT: 0 % (ref 0–1)
Basophils Absolute: 0 10*3/uL (ref 0.0–0.1)
EOS ABS: 0 10*3/uL (ref 0.0–0.7)
EOS PCT: 0 % (ref 0–5)
HCT: 41.7 % (ref 36.0–46.0)
HEMOGLOBIN: 14.1 g/dL (ref 12.0–15.0)
LYMPHS ABS: 1.9 10*3/uL (ref 0.7–4.0)
Lymphocytes Relative: 22 % (ref 12–46)
MCH: 31.8 pg (ref 26.0–34.0)
MCHC: 33.8 g/dL (ref 30.0–36.0)
MCV: 93.9 fL (ref 78.0–100.0)
MONOS PCT: 4 % (ref 3–12)
Monocytes Absolute: 0.3 10*3/uL (ref 0.1–1.0)
Neutro Abs: 6.4 10*3/uL (ref 1.7–7.7)
Neutrophils Relative %: 74 % (ref 43–77)
Platelets: 151 10*3/uL (ref 150–400)
RBC: 4.44 MIL/uL (ref 3.87–5.11)
RDW: 13.5 % (ref 11.5–15.5)
WBC: 8.6 10*3/uL (ref 4.0–10.5)

## 2014-06-27 LAB — URINALYSIS, ROUTINE W REFLEX MICROSCOPIC
Bilirubin Urine: NEGATIVE
GLUCOSE, UA: 250 mg/dL — AB
Ketones, ur: NEGATIVE mg/dL
LEUKOCYTES UA: NEGATIVE
Nitrite: NEGATIVE
Protein, ur: NEGATIVE mg/dL
Specific Gravity, Urine: 1.009 (ref 1.005–1.030)
UROBILINOGEN UA: 0.2 mg/dL (ref 0.0–1.0)
pH: 7 (ref 5.0–8.0)

## 2014-06-27 LAB — HEPARIN LEVEL (UNFRACTIONATED)
Heparin Unfractionated: 1.18 IU/mL — ABNORMAL HIGH (ref 0.30–0.70)
Heparin Unfractionated: 0.92 IU/mL — ABNORMAL HIGH (ref 0.30–0.70)

## 2014-06-27 LAB — PROTIME-INR
INR: 1 (ref 0.00–1.49)
Prothrombin Time: 13.3 seconds (ref 11.6–15.2)

## 2014-06-27 LAB — URINE MICROSCOPIC-ADD ON

## 2014-06-27 LAB — ANTITHROMBIN III: ANTITHROMB III FUNC: 104 % (ref 75–120)

## 2014-06-27 LAB — APTT: aPTT: 29 seconds (ref 24–37)

## 2014-06-27 LAB — D-DIMER, QUANTITATIVE (NOT AT ARMC): D DIMER QUANT: 7.66 ug{FEU}/mL — AB (ref 0.00–0.48)

## 2014-06-27 LAB — TROPONIN I

## 2014-06-27 LAB — MRSA PCR SCREENING: MRSA BY PCR: NEGATIVE

## 2014-06-27 LAB — PRO B NATRIURETIC PEPTIDE: PRO B NATRI PEPTIDE: 3231 pg/mL — AB (ref 0–450)

## 2014-06-27 LAB — HOMOCYSTEINE: HOMOCYSTEINE-NORM: 13.5 umol/L (ref 4.0–15.4)

## 2014-06-27 MED ORDER — HEPARIN (PORCINE) IN NACL 100-0.45 UNIT/ML-% IJ SOLN
750.0000 [IU]/h | INTRAMUSCULAR | Status: DC
  Administered 2014-06-27: 1200 [IU]/h via INTRAVENOUS
  Administered 2014-06-27: 950 [IU]/h via INTRAVENOUS
  Filled 2014-06-27 (×4): qty 250

## 2014-06-27 MED ORDER — IOHEXOL 350 MG/ML SOLN
100.0000 mL | Freq: Once | INTRAVENOUS | Status: AC | PRN
  Administered 2014-06-27: 100 mL via INTRAVENOUS

## 2014-06-27 MED ORDER — SODIUM CHLORIDE 0.9 % IV SOLN: 250.0000 mL | INTRAVENOUS | Status: DC | PRN

## 2014-06-27 MED ORDER — ONDANSETRON HCL 4 MG PO TABS: 4.0000 mg | ORAL_TABLET | Freq: Four times a day (QID) | ORAL | Status: DC | PRN

## 2014-06-27 MED ORDER — HEPARIN BOLUS VIA INFUSION
4000.0000 [IU] | Freq: Once | INTRAVENOUS | Status: AC
  Administered 2014-06-27: 4000 [IU] via INTRAVENOUS
  Filled 2014-06-27: qty 4000

## 2014-06-27 MED ORDER — ENSURE COMPLETE PO LIQD: 237.0000 mL | Freq: Two times a day (BID) | ORAL | Status: DC | PRN

## 2014-06-27 MED ORDER — ENOXAPARIN SODIUM 150 MG/ML ~~LOC~~ SOLN: 1.0000 mg/kg | Freq: Once | SUBCUTANEOUS | Status: DC

## 2014-06-27 MED ORDER — FUROSEMIDE 10 MG/ML IJ SOLN
40.0000 mg | Freq: Once | INTRAMUSCULAR | Status: AC
  Administered 2014-06-27: 40 mg via INTRAVENOUS
  Filled 2014-06-27: qty 4

## 2014-06-27 MED ORDER — SODIUM CHLORIDE 0.9 % IJ SOLN: 3.0000 mL | INTRAMUSCULAR | Status: DC | PRN

## 2014-06-27 MED ORDER — ONDANSETRON HCL 4 MG/2ML IJ SOLN: 4.0000 mg | Freq: Four times a day (QID) | INTRAMUSCULAR | Status: DC | PRN

## 2014-06-27 MED ORDER — ACETAMINOPHEN 325 MG PO TABS: 650.0000 mg | ORAL_TABLET | Freq: Four times a day (QID) | ORAL | Status: DC | PRN

## 2014-06-27 MED ORDER — PNEUMOCOCCAL VAC POLYVALENT 25 MCG/0.5ML IJ INJ
0.5000 mL | INJECTION | INTRAMUSCULAR | Status: DC
  Filled 2014-06-27 (×4): qty 0.5

## 2014-06-27 MED ORDER — ACETAMINOPHEN 650 MG RE SUPP: 650.0000 mg | Freq: Four times a day (QID) | RECTAL | Status: DC | PRN

## 2014-06-27 MED ORDER — INFLUENZA VAC SPLIT QUAD 0.5 ML IM SUSY
0.5000 mL | PREFILLED_SYRINGE | INTRAMUSCULAR | Status: AC
  Administered 2014-06-28: 0.5 mL via INTRAMUSCULAR
  Filled 2014-06-27 (×2): qty 0.5

## 2014-06-27 MED ORDER — SODIUM CHLORIDE 0.9 % IJ SOLN: 3.0000 mL | Freq: Two times a day (BID) | INTRAMUSCULAR | Status: DC

## 2014-06-27 NOTE — Progress Notes (Signed)
[facility] Progress Note Patient Name: Monique [name] DOB: Nov 18, 1935 MRN: [id number]   Date of Service  06/27/2014  HPI/Events of Note  Called by Dr. Roxanne Mins to evaluate for need for lytics.  Discussed case.  Patient is not tachycardic, hemodynamically stable, O2 sat of 80 on RA that improved with O2 application and currently not short of breath.    eICU Interventions  Given the fact that patient is not desaturating, hemodynamically stable, age and prior SAH (that precluded coumadin use) I would not recommend using of lytics without further history.  But would defer to Dr. Colin Broach decision on medical management as I have not seen the patient or examined her.        YACOUB,WESAM 06/27/2014, 4:22 AM

## 2014-06-27 NOTE — ED Notes (Signed)
Pt ambulated to bathroom and oxygen dropped to 72% on 3 litters. Pt came back to room and oxygen wasn't coming up so I upped her oxygen to 4 litters and it came to 84%. Pt is now on 100% non rebreather sating at 91%.

## 2014-06-27 NOTE — Progress Notes (Signed)
Rx Brief Anticoagulation note:  IV Heparin  Assessement:  HL= 0.92- RN confirmed drawn from opposite side as heparin  No bleeding reported  Still above desired range (0.3-0.7 units/ml)  Plan:  Decrease heparin drip to 750 units/hr  Recheck HL 0700  Dorrene German 06/27/2014 10:57 PM

## 2014-06-27 NOTE — ED Notes (Signed)
Pt states has had shortness of breath since Tuesday that has worsened, pt states shortness of breath is worse w/ exertion, pt also states had had centralized chest discomfort 5/10, pt denies n/v/d, denies dizziness, pt states has also had a mild headache, pt a/o x 4, currently on 100% non-rebreather at 91%.

## 2014-06-27 NOTE — Progress Notes (Signed)
  Echocardiogram 2D Echocardiogram has been performed.  Monique [name] 06/27/2014, 12:49 PM

## 2014-06-27 NOTE — ED Notes (Signed)
Patient transported to CT 

## 2014-06-27 NOTE — Progress Notes (Signed)
Nutrition Brief Note  Patient identified on the Malnutrition Screening Tool (MST) Report  Wt Readings from Last 15 Encounters:  06/27/14 149 lb 11.1 oz (67.9 kg)  04/04/14 162 lb 2 oz (73.539 kg)  03/13/14 165 lb (74.844 kg)  12/27/13 167 lb (75.751 kg)  11/06/13 163 lb 12.8 oz (74.299 kg)  09/20/13 164 lb (74.39 kg)  07/18/13 165 lb (74.844 kg)  03/20/13 159 lb 12.8 oz (72.485 kg)  12/24/12 160 lb (72.576 kg)  09/20/12 157 lb 1.3 oz (71.251 kg)  09/18/12 160 lb (72.576 kg)  05/15/12 162 lb 12.8 oz (73.846 kg)  09/26/11 172 lb (78.019 kg)  08/12/11 167 lb (75.751 kg)  08/16/10 164 lb (74.39 kg)    Body mass index is 24.17 kg/(m^2). Patient meets criteria for Normal weight based on current BMI.   Current diet order is HH, patient is consuming approximately 50-75% of meals at this time. Labs and medications reviewed.   Pt reported an unintentional wt loss of 10 lbs (6% body weight in 3 months, non-significant for time frame), however denied any recent changes in appetite. Cooks for self at home, usually [place] comfort foods-mac and cheese, meatloaf, or vegetables. Will occasionally sleep through dinner meal. Encouraged intake of supplement to prevent pt from missing meals. Will order Ensure Complete PRN.   No nutrition interventions warranted at this time. If nutrition issues arise, please consult RD.   Monique Abide MS RD LDN Clinical Dietitian [phone]

## 2014-06-27 NOTE — H&P (Signed)
PCP:   Adella Hare, MD   Chief Complaint:  sob  HPI: 78 yo female h/o HOCM, PAF, SAH in 2000 with no h/o aneurysm per pt report however after my review of her records she had angiogram done after her [place] and there is mention of a small 69mm aneurysm and several areas of fibromuscular dysplasia she recovered completely from this stroke and was told never to take any blood thinners in the future.  She has had no further issues since.  On Thursday (earlier today) she had sudden worsening of her sob that was noticably different.  She has had no le edema or swelling.  No estrogen.  No recent traveling no recent surgeries or illnessess.  No wt loss.  Her last mammogram was in 2013, her last colon screening was over ten years ago.  She denies any lumps or bumps in her breast or skin that she has noticed, she denies any abd pain or brbpr or melena.  She has not h/o vte in past.  She denies any chest pain, at rest or pleuritic in nature.  She can speak in full sentences and feels better with supplemental oxygen.  She is currently on 100% nrb on maintaining good oxygenation.  Review of Systems:  Positive and negative as per HPI otherwise all other systems are negative  Past Medical History: Past Medical History  Diagnosis Date  . PAF (paroxysmal atrial fibrillation)   . LVH (left ventricular hypertrophy)   . Subarachnoid hemorrhage   . Hyperlipidemia   . HTN (hypertension)     Essential   Past Surgical History  Procedure Laterality Date  . Hysterectomy      Medications: Prior to Admission medications   Medication Sig Start Date End Date Taking? Authorizing Provider  acetaminophen (TYLENOL) 500 MG tablet Take 1,000 mg by mouth every 6 (six) hours as needed for mild pain.   Yes Historical Provider, MD  disopyramide (NORPACE) 150 MG capsule Take 1 capsule (150 mg total) by mouth 2 (two) times daily. 12/27/13  Yes Fay Records, MD  losartan (COZAAR) 100 MG tablet Take 1 tablet (100 mg total) by  mouth daily. 12/27/13  Yes Fay Records, MD  metoprolol succinate (TOPROL-XL) 100 MG 24 hr tablet Take 100 mg by mouth 2 (two) times daily. Take with or immediately following a meal.   Yes Historical Provider, MD  NIFEdipine (PROCARDIA XL/ADALAT-CC) 60 MG 24 hr tablet Take 60 mg by mouth daily.   Yes Historical Provider, MD  simvastatin (ZOCOR) 40 MG tablet Take 40 mg by mouth daily.   Yes Historical Provider, MD    Allergies:  No Known Allergies  Social History:  reports that she has quit smoking. She has never used smokeless tobacco. She reports that she does not drink alcohol or use illicit drugs.  Family History: History reviewed. No pertinent family history.  Physical Exam: Filed Vitals:   06/27/14 0126 06/27/14 0302 06/27/14 0430 06/27/14 0439  BP:  113/69  144/84  Pulse:  84  97  Temp:  98.4 F (36.9 C)    TempSrc:  Oral    Resp:  18  24  Height:   5\' 6"  (1.676 m)   Weight:   73.483 kg (162 lb)   SpO2: 84% 95%  90%   General appearance: alert, cooperative and moderate distress Head: Normocephalic, without obvious abnormality, atraumatic Eyes: negative Nose: Nares normal. Septum midline. Mucosa normal. No drainage or sinus tenderness. Neck: no JVD and supple, symmetrical, trachea  midline Lungs: clear to auscultation bilaterally Heart: regular rate and rhythm, S1, S2 normal, no murmur, click, rub or gallop Abdomen: soft, non-tender; bowel sounds normal; no masses,  no organomegaly Extremities: extremities normal, atraumatic, no cyanosis or edema Pulses: 2+ and symmetric Skin: Skin color, texture, turgor normal. No rashes or lesions Neurologic: Grossly normal   Labs on Admission:   Recent Labs  06/27/14 0141  NA 144  K 4.1  CL 106  CO2 23  GLUCOSE 214*  BUN 20  CREATININE 1.30*  CALCIUM 9.8    Recent Labs  06/27/14 0141  AST 40*  ALT 21  ALKPHOS 100  BILITOT 0.7  PROT 7.7  ALBUMIN 3.8    Recent Labs  06/27/14 0141  WBC 8.6  NEUTROABS 6.4   HGB 14.1  HCT 41.7  MCV 93.9  PLT 151    Recent Labs  06/27/14 0141  TROPONINI <0.30   Radiological Exams on Admission: Dg Chest 2 View  06/27/2014   CLINICAL DATA:  Shortness of breath for 1 week, worsening today. Upper chest pain and low oxygen saturations.  EXAM: CHEST  2 VIEW  COMPARISON:  05/20/2008  FINDINGS: Normal heart size and pulmonary vascularity. No focal airspace disease or consolidation in the lungs. No blunting of costophrenic angles. No pneumothorax. Mediastinal contours appear intact. Tortuous aorta.  IMPRESSION: No active cardiopulmonary disease.   Electronically Signed   By: Lucienne Capers M.D.   On: 06/27/2014 01:49   Ct Angio Chest Pe W/cm &/or Wo Cm  06/27/2014   CLINICAL DATA:  Shortness of breath increasing with exertion. Central chest pain. Elevated D-dimer.  EXAM: CT ANGIOGRAPHY CHEST WITH CONTRAST  TECHNIQUE: Multidetector CT imaging of the chest was performed using the standard protocol during bolus administration of intravenous contrast. Multiplanar CT image reconstructions and MIPs were obtained to evaluate the vascular anatomy.  CONTRAST:  128mL OMNIPAQUE IOHEXOL 350 MG/ML SOLN  COMPARISON:  CT abdomen and pelvis 01/09/2013  FINDINGS: Technically adequate study with good opacification of the central and segmental pulmonary arteries. Multiple filling defects are demonstrated in the lobar and proximal segmental pulmonary arteries consistent with multiple pulmonary emboli. Increased RV to LV ratio suggest possibility of right heart strain.  Normal caliber thoracic aorta. No significant lymphadenopathy in the chest. Esophagus is decompressed. Lungs are clear. Airways are patent. No pleural effusion. No pneumothorax. Included portions of the upper abdominal organs are grossly unremarkable. Degenerative changes in the spine.  Review of the MIP images confirms the above findings.  IMPRESSION: Positive study for multiple bilateral central and segmental pulmonary emboli.  Increased RV LV ratio suggest possible right heart strain.  These results were called by telephone at the time of interpretation on 06/27/2014 at 4:12 am to Dr. Delora Fuel , who verbally acknowledged these results.   Electronically Signed   By: Lucienne Capers M.D.   On: 06/27/2014 04:17    Assessment/Plan  78 yo female with multiple bilateral pulmonary emboli with remote history of subarachnoid hemorrage 2000  Principal Problem:   Pulmonary embolism, bilateral-  pccm was called and did not think patient was thrombolytic candidate based on her hypoxia alone however i believe h/o SAH is a contraindication to such.  Place on heparin gtt at this time.  Place in stepdown unit.  PE is unprovoked, will need to r/o underlying malignancy at some point once more stable.  EDP suggested to patient about placing IVC filter, however i do not think her remote history would exclude her from anticoagulation  for short period of time.  Obtaining consult from hematology may be beneficial in her case in determining treatment with warfarin +/- ivc placement (would stay away from newer agents due to lack of reversal agents).  For now will not load with any agent, and place on heparin.  Active Problems:  Stable unless o/w noted   Essential hypertension-  Hold all cardiac meds at this time.   Hypertrophic obstructive cardiomyopathy (HOCM)   PAROXYSMAL ATRIAL FIBRILLATION   Subarachnoid hemorrhage 2000   SOB (shortness of breath)   Acute respiratory failure with hypoxia   Pulmonary embolism   History of fibromuscular dysplasia per intracranial/vertebral angiogram done in 2000   Cerebral aneurysm 2000  Full code.  Admit to stepdown.    Monifa Blanchette A 06/27/2014, 5:44 AM

## 2014-06-27 NOTE — ED Notes (Signed)
Pt becomes very short of breath w/ movement, when pt was placed on bedpan and taken off she dropped to 85% on 100% non-rebreather, shortly after resting came up to 90%.

## 2014-06-27 NOTE — ED Notes (Signed)
Pt states she has been feeling short of breath all week but worse today  Pt states she has been having mild pain in her chest today  Pt has hx of heart murmur  Oxygen sat on room air 81%  Oxygen applied in triage at 2 l/min via [place]

## 2014-06-27 NOTE — Progress Notes (Signed)
VASCULAR LAB PRELIMINARY  PRELIMINARY  PRELIMINARY  PRELIMINARY  Bilateral lower extremity venous duplex completed.    Preliminary report:  Right - Positive for acute deep vein thrombus of the proximal profunda and proximal femoral veins coursing through the common femoral veins. These areas are nearly occluded. There is no evidence of a superficial thrombus or Baker's cyst. Left - There is no evidence of deep vein or superficial vein thrombosis. There is no evidence of a Baker's cyst.  Patrik Turnbaugh, RVS 06/27/2014, 10:15 AM

## 2014-06-27 NOTE — Progress Notes (Signed)
CARE MANAGEMENT NOTE 06/27/2014  Patient:  Monique [name], Monique [name]   Account Number:  [id number]  Date Initiated:  06/27/2014  Documentation initiated by:  DAVIS,RHONDA  Subjective/Objective Assessment:   pulmonary embolism/bilaterally per ct scan/rt heart strain is present.     Action/Plan:   home when stable/lives alone but does have a family support system through her adult children   Anticipated DC Date:  06/30/2014   Anticipated DC Plan:  HOME/SELF CARE  In-house referral  NA      DC Planning Services  NA      [facility] Choice  NA   Choice offered to / List presented to:  NA   DME arranged  NA      DME agency  NA     [place] arranged  NA      [place] agency  NA   Status of service:  In process, will continue to follow Medicare Important Message given?   (If response is "NO", the following Medicare IM given date fields will be blank) Date Medicare IM given:   Medicare IM given by:   Date Additional Medicare IM given:   Additional Medicare IM given by:    Discharge Disposition:    Per UR Regulation:  Reviewed for med. necessity/level of care/duration of stay  If discussed at [place] of Stay Meetings, dates discussed:    Comments:  [id number]/Rhonda DAvis,RN,BSN,CCM

## 2014-06-27 NOTE — ED Notes (Signed)
RT called for ABG.

## 2014-06-27 NOTE — Progress Notes (Signed)
[place] for IV Hepairn Indication: pulmonary embolus  No Known Allergies  Patient Measurements: Height: 5\' 6"  (167.6 cm) Weight: 149 lb 11.1 oz (67.9 kg) IBW/kg (Calculated) : 59.3   Vital Signs: Temp: 96.8 F (36 C) (10/23 0800) Temp Source: Axillary (10/23 0800) BP: 119/75 mmHg (10/23 1300) Pulse Rate: 81 (10/23 1300)  Labs:  Recent Labs  06/27/14 0141 06/27/14 0420 06/27/14 1300  HGB 14.1  --   --   HCT 41.7  --   --   PLT 151  --   --   APTT  --  29  --   LABPROT  --  13.3  --   INR  --  1.00  --   HEPARINUNFRC  --   --  1.18*  CREATININE 1.30*  --   --   TROPONINI <0.30  --   --     Estimated Creatinine Clearance: 34.5 ml/min (by C-G formula based on Cr of 1.3).   Medical History: Past Medical History  Diagnosis Date  . PAF (paroxysmal atrial fibrillation)   . LVH (left ventricular hypertrophy)   . Subarachnoid hemorrhage   . Hyperlipidemia   . HTN (hypertension)     Essential    Medications:  Scheduled:  . [START ON 06/28/2014] Influenza vac split quadrivalent PF  0.5 mL Intramuscular Tomorrow-1000  . [START ON 06/28/2014] pneumococcal 23 valent vaccine  0.5 mL Intramuscular Tomorrow-1000  . sodium chloride  3 mL Intravenous Q12H   Infusions:  . heparin 1,200 Units/hr (06/27/14 0448)    Assessment: 60 yoF presented with ShOB, was found to have bilateral PE.  Note history of subarachnoid hemorrhage, therefore tPA contraindicated.  Orders received to begin IV heparin with pharmacy dosing assistance.   Goal of Therapy:  Heparin level 0.3-0.7 units/ml Monitor platelets by anticoagulation protocol: Yes   Heparin level was originally reported by lab as 0.59 (therapeutic), then lab called RN to report that a dilutional error had occurred and the correct value is 1.18 (supratherapeutic).  Heparin infusion currently at 1200 units/hr.   No infusion-related problems or bleeding reported.  Plan: 1. Reduce  Heparin to 950 units/hr. 2. Repeat heparin level at 9 pm. 3. Daily heparin level and CBC.  Clayburn Pert, PharmD, BCPS Pager: [phone] 06/27/2014  2:56 PM

## 2014-06-27 NOTE — ED Provider Notes (Signed)
CSN: [id number]     Arrival date & time 06/27/14  0013 History   First MD Initiated Contact with Patient 06/27/14 0135     Chief Complaint  Patient presents with  . Shortness of Breath     (Consider location/radiation/quality/duration/timing/severity/associated sxs/prior Treatment) Patient is a 78 y.o. female presenting with shortness of breath. The history is provided by the patient.  Shortness of Breath She has been having progressive dyspnea for the last 5 days. Dyspnea is worse with exertion and worse with laying flat. It got to the point tonight where she cannot even walk to the bathroom. She denies chest pain, heaviness, tightness, pressure. There has been no coughing no nausea or vomiting. She denies fever, chills, sweats. She denies arthralgias or myalgias. She has not had problems with this before. She denies any recent surgery or long-distance travel. There is no history of DVT. She does see a cardiologist for paroxysmal atrial fibrillation and has a history of a heart murmur.  Past Medical History  Diagnosis Date  . PAF (paroxysmal atrial fibrillation)   . LVH (left ventricular hypertrophy)   . Subarachnoid hemorrhage   . Hyperlipidemia   . HTN (hypertension)     Essential   Past Surgical History  Procedure Laterality Date  . Hysterectomy     History reviewed. No pertinent family history. History  Substance Use Topics  . Smoking status: Former Research scientist (life sciences)  . Smokeless tobacco: Never Used  . Alcohol Use: No   OB History   Grav Para Term Preterm Abortions TAB SAB Ect Mult Living                 Review of Systems  Respiratory: Positive for shortness of breath.   All other systems reviewed and are negative.     Allergies  Review of patient's allergies indicates no known allergies.  Home Medications   Prior to Admission medications   Medication Sig Start Date End Date Taking? Authorizing Provider  acetaminophen (TYLENOL) 500 MG tablet Take 1,000 mg by mouth  every 6 (six) hours as needed for mild pain.   Yes Historical Provider, MD  disopyramide (NORPACE) 150 MG capsule Take 1 capsule (150 mg total) by mouth 2 (two) times daily. 12/27/13  Yes Fay Records, MD  losartan (COZAAR) 100 MG tablet Take 1 tablet (100 mg total) by mouth daily. 12/27/13  Yes Fay Records, MD  metoprolol succinate (TOPROL-XL) 100 MG 24 hr tablet Take 100 mg by mouth 2 (two) times daily. Take with or immediately following a meal.   Yes Historical Provider, MD  NIFEdipine (PROCARDIA XL/ADALAT-CC) 60 MG 24 hr tablet Take 60 mg by mouth daily.   Yes Historical Provider, MD  simvastatin (ZOCOR) 40 MG tablet Take 40 mg by mouth daily.   Yes Historical Provider, MD   BP 138/87  Pulse 100  Temp(Src) 97.6 F (36.4 C) (Oral)  Resp 24  SpO2 84% Physical Exam  Nursing note and vitals reviewed.  78 year old female, resting comfortably and in no acute distress. Vital signs are significant for tachypnea. Oxygen saturation is 84%, which is hypoxic. Groin is 100% oxygen, oxygen saturation comes up to 94%. Head is normocephalic and atraumatic. PERRLA, EOMI. Oropharynx is clear. Neck is nontender and supple without adenopathy or JVD. Back is nontender and there is no CVA tenderness. Lungs are clear without rales, wheezes, or rhonchi. Chest is nontender. Heart has regular rate and rhythm with 1-9/1 systolic murmur at the base with radiation to the  neck. Abdomen is soft, flat, nontender without masses or hepatosplenomegaly and peristalsis is normoactive. Extremities have no cyanosis or edema, full range of motion is present. Skin is warm and dry without rash. Neurologic: Mental status is normal, cranial nerves are intact, there are no motor or sensory deficits.  ED Course  Procedures (including critical care time) Labs Review Results for orders placed during the hospital encounter of 06/27/14  CBC WITH DIFFERENTIAL      Result Value Ref Range   WBC 8.6  4.0 - 10.5 K/uL   RBC 4.44   3.87 - 5.11 MIL/uL   Hemoglobin 14.1  12.0 - 15.0 g/dL   HCT 41.7  36.0 - 46.0 %   MCV 93.9  78.0 - 100.0 fL   MCH 31.8  26.0 - 34.0 pg   MCHC 33.8  30.0 - 36.0 g/dL   RDW 13.5  11.5 - 15.5 %   Platelets 151  150 - 400 K/uL   Neutrophils Relative % 74  43 - 77 %   Neutro Abs 6.4  1.7 - 7.7 K/uL   Lymphocytes Relative 22  12 - 46 %   Lymphs Abs 1.9  0.7 - 4.0 K/uL   Monocytes Relative 4  3 - 12 %   Monocytes Absolute 0.3  0.1 - 1.0 K/uL   Eosinophils Relative 0  0 - 5 %   Eosinophils Absolute 0.0  0.0 - 0.7 K/uL   Basophils Relative 0  0 - 1 %   Basophils Absolute 0.0  0.0 - 0.1 K/uL  COMPREHENSIVE METABOLIC PANEL      Result Value Ref Range   Sodium 144  137 - 147 mEq/L   Potassium 4.1  3.7 - 5.3 mEq/L   Chloride 106  96 - 112 mEq/L   CO2 23  19 - 32 mEq/L   Glucose, Bld 214 (*) 70 - 99 mg/dL   BUN 20  6 - 23 mg/dL   Creatinine, Ser 1.30 (*) 0.50 - 1.10 mg/dL   Calcium 9.8  8.4 - 10.5 mg/dL   Total Protein 7.7  6.0 - 8.3 g/dL   Albumin 3.8  3.5 - 5.2 g/dL   AST 40 (*) 0 - 37 U/L   ALT 21  0 - 35 U/L   Alkaline Phosphatase 100  39 - 117 U/L   Total Bilirubin 0.7  0.3 - 1.2 mg/dL   GFR calc non Af Amer 39 (*) >90 mL/min   GFR calc Af Amer 45 (*) >90 mL/min   Anion gap 15  5 - 15  D-DIMER, QUANTITATIVE      Result Value Ref Range   D-Dimer, Quant 7.66 (*) 0.00 - 0.48 ug/mL-FEU  TROPONIN I      Result Value Ref Range   Troponin I <0.30  <0.30 ng/mL  URINALYSIS, ROUTINE W REFLEX MICROSCOPIC      Result Value Ref Range   Color, Urine YELLOW  YELLOW   APPearance CLEAR  CLEAR   Specific Gravity, Urine 1.009  1.005 - 1.030   pH 7.0  5.0 - 8.0   Glucose, UA 250 (*) NEGATIVE mg/dL   Hgb urine dipstick SMALL (*) NEGATIVE   Bilirubin Urine NEGATIVE  NEGATIVE   Ketones, ur NEGATIVE  NEGATIVE mg/dL   Protein, ur NEGATIVE  NEGATIVE mg/dL   Urobilinogen, UA 0.2  0.0 - 1.0 mg/dL   Nitrite NEGATIVE  NEGATIVE   Leukocytes, UA NEGATIVE  NEGATIVE  PRO B NATRIURETIC PEPTIDE  Result Value Ref Range   Pro B Natriuretic peptide (BNP) 3231.0 (*) 0 - 450 pg/mL  URINE MICROSCOPIC-ADD ON      Result Value Ref Range   Squamous Epithelial / LPF RARE  RARE   WBC, UA 0-2  <3 WBC/hpf   RBC / HPF 3-6  <3 RBC/hpf   Imaging Review Dg Chest 2 View  06/27/2014   CLINICAL DATA:  Shortness of breath for 1 week, worsening today. Upper chest pain and low oxygen saturations.  EXAM: CHEST  2 VIEW  COMPARISON:  05/20/2008  FINDINGS: Normal heart size and pulmonary vascularity. No focal airspace disease or consolidation in the lungs. No blunting of costophrenic angles. No pneumothorax. Mediastinal contours appear intact. Tortuous aorta.  IMPRESSION: No active cardiopulmonary disease.   Electronically Signed   By: Lucienne Capers M.D.   On: 06/27/2014 01:49   Ct Angio Chest Pe W/cm &/or Wo Cm  06/27/2014   CLINICAL DATA:  Shortness of breath increasing with exertion. Central chest pain. Elevated D-dimer.  EXAM: CT ANGIOGRAPHY CHEST WITH CONTRAST  TECHNIQUE: Multidetector CT imaging of the chest was performed using the standard protocol during bolus administration of intravenous contrast. Multiplanar CT image reconstructions and MIPs were obtained to evaluate the vascular anatomy.  CONTRAST:  156mL OMNIPAQUE IOHEXOL 350 MG/ML SOLN  COMPARISON:  CT abdomen and pelvis 01/09/2013  FINDINGS: Technically adequate study with good opacification of the central and segmental pulmonary arteries. Multiple filling defects are demonstrated in the lobar and proximal segmental pulmonary arteries consistent with multiple pulmonary emboli. Increased RV to LV ratio suggest possibility of right heart strain.  Normal caliber thoracic aorta. No significant lymphadenopathy in the chest. Esophagus is decompressed. Lungs are clear. Airways are patent. No pleural effusion. No pneumothorax. Included portions of the upper abdominal organs are grossly unremarkable. Degenerative changes in the spine.  Review of the MIP images  confirms the above findings.  IMPRESSION: Positive study for multiple bilateral central and segmental pulmonary emboli. Increased RV LV ratio suggest possible right heart strain.  These results were called by telephone at the time of interpretation on 06/27/2014 at 4:12 am to Dr. Delora Fuel , who verbally acknowledged these results.   Electronically Signed   By: Lucienne Capers M.D.   On: 06/27/2014 04:17  Ct Angio Chest Pe W/cm &/or Wo Cm  06/27/2014   CLINICAL DATA:  Shortness of breath increasing with exertion. Central chest pain. Elevated D-dimer.  EXAM: CT ANGIOGRAPHY CHEST WITH CONTRAST  TECHNIQUE: Multidetector CT imaging of the chest was performed using the standard protocol during bolus administration of intravenous contrast. Multiplanar CT image reconstructions and MIPs were obtained to evaluate the vascular anatomy.  CONTRAST:  117mL OMNIPAQUE IOHEXOL 350 MG/ML SOLN  COMPARISON:  CT abdomen and pelvis 01/09/2013  FINDINGS: Technically adequate study with good opacification of the central and segmental pulmonary arteries. Multiple filling defects are demonstrated in the lobar and proximal segmental pulmonary arteries consistent with multiple pulmonary emboli. Increased RV to LV ratio suggest possibility of right heart strain.  Normal caliber thoracic aorta. No significant lymphadenopathy in the chest. Esophagus is decompressed. Lungs are clear. Airways are patent. No pleural effusion. No pneumothorax. Included portions of the upper abdominal organs are grossly unremarkable. Degenerative changes in the spine.  Review of the MIP images confirms the above findings.  IMPRESSION: Positive study for multiple bilateral central and segmental pulmonary emboli. Increased RV LV ratio suggest possible right heart strain.  These results were called by telephone at the  time of interpretation on 06/27/2014 at 4:12 am to Dr. Delora Fuel , who verbally acknowledged these results.   Electronically Signed   By: Lucienne Capers M.D.   On: 06/27/2014 04:17   Images viewed by me, discussed with radiologist.   EKG Interpretation   Date/Time:  Friday June 27 2014 00:12:10 EDT Ventricular Rate:  95 PR Interval:  176 QRS Duration: 88 QT Interval:  394 QTC Calculation: 495 R Axis:   -52 Text Interpretation:  Normal sinus rhythm Left anterior fascicular block  Left ventricular hypertrophy with repolarization abnormality Prolonged QT  Abnormal ECG When compared with ECG of 05/21/2008, QT has shortened  Confirmed by [facility]  MD, Vian Fluegel ([id number]) on 06/27/2014 1:57:17 AM      CRITICAL CARE Performed by: OZYYQ,MGNOI Total critical care time: 45 minutes Critical care time was exclusive of separately billable procedures and treating other patients. Critical care was necessary to treat or prevent imminent or life-threatening deterioration. Critical care was time spent personally by me on the following activities: development of treatment plan with patient and/or surrogate as well as nursing, discussions with consultants, evaluation of patient's response to treatment, examination of patient, obtaining history from patient or surrogate, ordering and performing treatments and interventions, ordering and review of laboratory studies, ordering and review of radiographic studies, pulse oximetry and re-evaluation of patient's condition.  MDM   Final diagnoses:  Shortness of breath    Acute dyspnea without evidence of respiratory infection. No physical findings to suggest heart failure. Old records reviewed and she has been under the care of a cardiologist and an echocardiogram did demonstrate grade 3 diastolic dysfunction. This is most likely the source of her dyspnea.  BNP has come back somewhat elevated but d-dimer has also come back elevated. She was given a dose of furosemide to treat suspected and diastolic heart failure and sent for CT angiogram. CT angiogram is come back showing bilateral pulmonary emboli which  are more prominent on the right. I did discuss her case with pulmonology to see if she would be considered a candidate for thrombolytic therapy based on her hypoxia. However, she was not tachycardic or hypotensive and was felt to be not a candidate for thrombolytic therapy. She is given a dose of enoxaparin and will hold be admitted to the hospital. Of note, she did have a subarachnoid hemorrhage in 2002 and was suspected of having an aneurysm but was never identified and she was told that she should not take anticoagulants. Clearly, the risk-benefit ratio has changed with 2 days of pulmonary emboli and she will need to be anticoagulated. Long-term, may need to consider use of an inferior vena cava filter. Case is discussed with Dr. Shanon Brow after that hospitalization received with the patient. She will be admitted to a step down unit.  Delora Fuel, MD 37/04/88 [id number]

## 2014-06-27 NOTE — Progress Notes (Signed)
ANTICOAGULATION CONSULT NOTE - Initial Consult  Pharmacy Consult for IV Hepairn Indication: pulmonary embolus  No Known Allergies  Patient Measurements: Height: 5\' 6"  (167.6 cm) Weight: 162 lb (73.483 kg) IBW/kg (Calculated) : 59.3 Used 73 kg  Vital Signs: Temp: 98.4 F (36.9 C) (10/23 0302) Temp Source: Oral (10/23 0302) BP: 144/84 mmHg (10/23 0439) Pulse Rate: 97 (10/23 0439)  Labs:  Recent Labs  06/27/14 0141 06/27/14 0420  HGB 14.1  --   HCT 41.7  --   PLT 151  --   APTT  --  29  LABPROT  --  13.3  INR  --  1.00  CREATININE 1.30*  --   TROPONINI <0.30  --     Estimated Creatinine Clearance: 37.8 ml/min (by C-G formula based on Cr of 1.3).   Medical History: Past Medical History  Diagnosis Date  . PAF (paroxysmal atrial fibrillation)   . LVH (left ventricular hypertrophy)   . Subarachnoid hemorrhage   . Hyperlipidemia   . HTN (hypertension)     Essential    Medications:  Scheduled:   Infusions:  . heparin 1,200 Units/hr (06/27/14 0448)    Assessment: 68 yoF c/o SOB with elevated d-dimer found to have  PE.  Note hx subarachnoid hemorrhage 2002.  IV heparin per Rx for PE.  Goal of Therapy:  Heparin level 0.3-0.7 units/ml Monitor platelets by anticoagulation protocol: Yes   Plan:   Baseline coags stat  Heparin 4000 unit bolus x1  Start drip @ 1200 units/hr  Daily CBC/HL  Check 1st HL in 8 hours  Lawana Pai R 06/27/2014,4:53 AM

## 2014-06-27 NOTE — Progress Notes (Signed)
[facility] PROGRESS NOTE  Alisi Lupien IWL:[id number] DOB: 09/30/1935 DOA: 06/27/2014 PCP: Adella Hare, MD  Assessment/Plan  Acute respiratory failure with hypoxia due to submassive pulmonary embolism, bilateral.  Due to history of aneurysm with intracranial hemorrhage patient is NOT a candidate for tPA.  Absolute contraindication.  Case was discussed with PCCM regardless who recommended against tPA.     - BNP elevated but troponin negative -  ECHO to eval for heart strain -  O2 as needed -  Cont heparin gtt -  Hematology consultation:  Recommend warfarin for longterm A/C -  Hypercoag panel -  Will need malignancy work up as outpatient -  Duplex lower extremity and more consideration for IVC filter if present -  Start warfarin after a minimum of 24 hours on therapeutic heparin   Essential hypertension, blood pressure low normal.  Continue to hold BP meds  Hypertrophic obstructive cardiomyopathy (HOCM)  PAROXYSMAL ATRIAL FIBRILLATION -  telemetry Subarachnoid hemorrhage 2000  SOB (shortness of breath)  History of fibromuscular dysplasia per intracranial/vertebral angiogram done in 2000  Cerebral aneurysm 2000  Diet:  Healthy heart Access:  PIV IVF:  off Proph:  Heparin gtt  Code Status: full Family Communication: patient alone Disposition Plan: pending plans for long-term a/c   Consultants:  Hematology  PCCM by phone  Procedures:  CXR  CT angio chest:  Bilateral PE with evidence of right heart strain  Antibiotics:  none   HPI/Subjective:  Breathing improved on NRB.    Objective: Filed Vitals:   06/27/14 0430 06/27/14 0439 06/27/14 0632 06/27/14 0700  BP:  144/84 128/68 103/53  Pulse:  97  78  Temp:      TempSrc:      Resp:  24 24 19   Height: 5\' 6"  (1.676 m)  5\' 6"  (1.676 m)   Weight: 73.483 kg (162 lb)  67.9 kg (149 lb 11.1 oz)   SpO2:  90% 100% 100%    Intake/Output Summary (Last 24 hours) at 06/27/14 0836 Last data filed at  06/27/14 0700  Gross per 24 hour  Intake     24 ml  Output    900 ml  Net   -876 ml   Filed Weights   06/27/14 0430 06/27/14 1740  Weight: 73.483 kg (162 lb) 67.9 kg (149 lb 11.1 oz)    Exam:   General:  Thin BF, breathless on NRB  HEENT:  NCAT, MMM  Cardiovascular:  RRR, nl S1, S2 no mrg, 2+ pulses, warm extremities  Respiratory:  CTAB, no increased WOB  Abdomen:   NABS, soft, NT/ND  MSK:   Normal tone and bulk, trace edema of the RLE  Neuro:  Grossly intact  Data Reviewed: Basic Metabolic Panel:  Recent Labs Lab 06/27/14 0141  NA 144  K 4.1  CL 106  CO2 23  GLUCOSE 214*  BUN 20  CREATININE 1.30*  CALCIUM 9.8   Liver Function Tests:  Recent Labs Lab 06/27/14 0141  AST 40*  ALT 21  ALKPHOS 100  BILITOT 0.7  PROT 7.7  ALBUMIN 3.8   No results found for this basename: LIPASE, AMYLASE,  in the last 168 hours No results found for this basename: AMMONIA,  in the last 168 hours CBC:  Recent Labs Lab 06/27/14 0141  WBC 8.6  NEUTROABS 6.4  HGB 14.1  HCT 41.7  MCV 93.9  PLT 151   Cardiac Enzymes:  Recent Labs Lab 06/27/14 0141  TROPONINI <0.30   BNP (last 3 results)  Recent Labs  06/27/14 0141  PROBNP 3231.0*   CBG: No results found for this basename: GLUCAP,  in the last 168 hours  No results found for this or any previous visit (from the past 240 hour(s)).   Studies: Dg Chest 2 View  06/27/2014   CLINICAL DATA:  Shortness of breath for 1 week, worsening today. Upper chest pain and low oxygen saturations.  EXAM: CHEST  2 VIEW  COMPARISON:  05/20/2008  FINDINGS: Normal heart size and pulmonary vascularity. No focal airspace disease or consolidation in the lungs. No blunting of costophrenic angles. No pneumothorax. Mediastinal contours appear intact. Tortuous aorta.  IMPRESSION: No active cardiopulmonary disease.   Electronically Signed   By: Lucienne Capers M.D.   On: 06/27/2014 01:49   Ct Angio Chest Pe W/cm &/or Wo  Cm  06/27/2014   CLINICAL DATA:  Shortness of breath increasing with exertion. Central chest pain. Elevated D-dimer.  EXAM: CT ANGIOGRAPHY CHEST WITH CONTRAST  TECHNIQUE: Multidetector CT imaging of the chest was performed using the standard protocol during bolus administration of intravenous contrast. Multiplanar CT image reconstructions and MIPs were obtained to evaluate the vascular anatomy.  CONTRAST:  184mL OMNIPAQUE IOHEXOL 350 MG/ML SOLN  COMPARISON:  CT abdomen and pelvis 01/09/2013  FINDINGS: Technically adequate study with good opacification of the central and segmental pulmonary arteries. Multiple filling defects are demonstrated in the lobar and proximal segmental pulmonary arteries consistent with multiple pulmonary emboli. Increased RV to LV ratio suggest possibility of right heart strain.  Normal caliber thoracic aorta. No significant lymphadenopathy in the chest. Esophagus is decompressed. Lungs are clear. Airways are patent. No pleural effusion. No pneumothorax. Included portions of the upper abdominal organs are grossly unremarkable. Degenerative changes in the spine.  Review of the MIP images confirms the above findings.  IMPRESSION: Positive study for multiple bilateral central and segmental pulmonary emboli. Increased RV LV ratio suggest possible right heart strain.  These results were called by telephone at the time of interpretation on 06/27/2014 at 4:12 am to Dr. Delora Fuel , who verbally acknowledged these results.   Electronically Signed   By: Lucienne Capers M.D.   On: 06/27/2014 04:17    Scheduled Meds: . [START ON 06/28/2014] Influenza vac split quadrivalent PF  0.5 mL Intramuscular Tomorrow-1000  . sodium chloride  3 mL Intravenous Q12H   Continuous Infusions: . heparin 1,200 Units/hr (06/27/14 0448)    Principal Problem:   Pulmonary embolism, bilateral Active Problems:   Essential hypertension   Hypertrophic obstructive cardiomyopathy (HOCM)   PAROXYSMAL ATRIAL  FIBRILLATION   Subarachnoid hemorrhage 2000   SOB (shortness of breath)   Acute respiratory failure with hypoxia   Pulmonary embolism   History of fibromuscular dysplasia   Cerebral aneurysm 2000    Time spent: 30 min    Smt. Loder, [facility] Pager [phone]. If 7PM-7AM, please contact night-coverage at [email or web address], password [facility] 06/27/2014, 8:36 AM  LOS: 0 days

## 2014-06-28 ENCOUNTER — Encounter (HOSPITAL_COMMUNITY): Payer: Self-pay | Admitting: *Deleted

## 2014-06-28 LAB — CBC
HCT: 38 % (ref 36.0–46.0)
Hemoglobin: 12.6 g/dL (ref 12.0–15.0)
MCH: 31.2 pg (ref 26.0–34.0)
MCHC: 33.2 g/dL (ref 30.0–36.0)
MCV: 94.1 fL (ref 78.0–100.0)
PLATELETS: 151 10*3/uL (ref 150–400)
RBC: 4.04 MIL/uL (ref 3.87–5.11)
RDW: 13.5 % (ref 11.5–15.5)
WBC: 10.2 10*3/uL (ref 4.0–10.5)

## 2014-06-28 LAB — HEPARIN LEVEL (UNFRACTIONATED)
HEPARIN UNFRACTIONATED: 0.3 [IU]/mL (ref 0.30–0.70)
Heparin Unfractionated: 0.72 IU/mL — ABNORMAL HIGH (ref 0.30–0.70)
Heparin Unfractionated: 0.23 IU/mL — ABNORMAL LOW (ref 0.30–0.70)

## 2014-06-28 LAB — BASIC METABOLIC PANEL
Anion gap: 10 (ref 5–15)
BUN: 18 mg/dL (ref 6–23)
CHLORIDE: 106 meq/L (ref 96–112)
CO2: 25 mEq/L (ref 19–32)
Calcium: 9 mg/dL (ref 8.4–10.5)
Creatinine, Ser: 1.21 mg/dL — ABNORMAL HIGH (ref 0.50–1.10)
GFR calc non Af Amer: 42 mL/min — ABNORMAL LOW (ref >90)
GFR, EST AFRICAN AMERICAN: 49 mL/min — AB (ref >90)
Glucose, Bld: 120 mg/dL — ABNORMAL HIGH (ref 70–99)
POTASSIUM: 3.8 meq/L (ref 3.7–5.3)
Sodium: 141 mEq/L (ref 137–147)

## 2014-06-28 MED ORDER — HEPARIN (PORCINE) IN NACL 100-0.45 UNIT/ML-% IJ SOLN
650.0000 [IU]/h | INTRAMUSCULAR | Status: DC
Start: 2014-06-28 — End: 2014-06-28
  Filled 2014-06-28: qty 250

## 2014-06-28 MED ORDER — OXYCODONE HCL 5 MG PO TABS
5.0000 mg | ORAL_TABLET | Freq: Four times a day (QID) | ORAL | Status: DC | PRN
  Administered 2014-06-28: 5 mg via ORAL
  Filled 2014-06-28 (×2): qty 1

## 2014-06-28 MED ORDER — HEPARIN (PORCINE) IN NACL 100-0.45 UNIT/ML-% IJ SOLN
750.0000 [IU]/h | INTRAMUSCULAR | Status: DC
Start: 2014-06-28 — End: 2014-06-30
  Administered 2014-06-29: 750 [IU]/h via INTRAVENOUS
  Filled 2014-06-28 (×2): qty 250

## 2014-06-28 MED ORDER — CETYLPYRIDINIUM CHLORIDE 0.05 % MT LIQD
7.0000 mL | Freq: Two times a day (BID) | OROMUCOSAL | Status: DC
  Administered 2014-06-29 – 2014-07-01 (×4): 7 mL via OROMUCOSAL

## 2014-06-28 NOTE — Progress Notes (Signed)
ANTICOAGULATION CONSULT NOTE   Pharmacy Consult for IV Hepairn Indication: pulmonary embolus  No Known Allergies  Patient Measurements: Height: 5\' 6"  (167.6 cm) Weight: 151 lb 8 oz (68.72 kg) IBW/kg (Calculated) : 59.3   Vital Signs: Temp: 99 F (37.2 C) (10/24 0800) Temp Source: Oral (10/24 0800) BP: 118/81 mmHg (10/24 1000) Pulse Rate: 104 (10/24 1000)  Labs:  Recent Labs  06/27/14 0141 06/27/14 0420  06/27/14 2100 06/28/14 0402 06/28/14 0630 06/28/14 1339  HGB 14.1  --   --   --  12.6  --   --   HCT 41.7  --   --   --  38.0  --   --   PLT 151  --   --   --  151  --   --   APTT  --  29  --   --   --   --   --   LABPROT  --  13.3  --   --   --   --   --   INR  --  1.00  --   --   --   --   --   HEPARINUNFRC  --   --   < > 0.92*  --  0.72* 0.23*  CREATININE 1.30*  --   --   --  1.21*  --   --   TROPONINI <0.30  --   --   --   --   --   --   < > = values in this interval not displayed.  Estimated Creatinine Clearance: 37 ml/min (by C-G formula based on Cr of 1.21).   Medical History: Past Medical History  Diagnosis Date  . PAF (paroxysmal atrial fibrillation)   . LVH (left ventricular hypertrophy)   . Subarachnoid hemorrhage   . Hyperlipidemia   . HTN (hypertension)     Essential    Medications:  Scheduled:  . pneumococcal 23 valent vaccine  0.5 mL Intramuscular Tomorrow-1000  . sodium chloride  3 mL Intravenous Q12H   Infusions:  . heparin 650 Units/hr (06/28/14 0739)    Assessment: 30 yoF presented with ShOB, was found to have bilateral PE.  Note history of subarachnoid hemorrhage, therefore tPA contraindicated.  Orders received to begin IV heparin with pharmacy dosing assistance.   Goal of Therapy:  Heparin level 0.3-0.7 units/ml Monitor platelets by anticoagulation protocol: Yes  10/24:  Heparin level subtherapeutic (0.23) on 650 units/hr.  RN confirms rate and states no problems with infusion.    H/H and pltc OK.  Plan: 1. Increase  Heparin to 750 units/hr 2. Recheck Heparin level at 8:30 pm  Clayburn Pert, PharmD, BCPS Pager: [phone] 06/28/2014  2:20 PM

## 2014-06-28 NOTE — Progress Notes (Signed)
Rx Brief Anticoagulation note:  IV Heparin  Assessement:  HL= 0.30  No bleeding reported or IV interuptions reported  At goal (0.3-0.7)  Plan:  Continue heparin drip @ 750 unit/shr  Recheck HL with am labs  Dorrene German 06/28/2014 9:40 PM

## 2014-06-28 NOTE — Progress Notes (Signed)
[place] for IV Hepairn Indication: pulmonary embolus  No Known Allergies  Patient Measurements: Height: 5\' 6"  (167.6 cm) Weight: 151 lb 8 oz (68.72 kg) IBW/kg (Calculated) : 59.3   Vital Signs: Temp: 97.8 F (36.6 C) (10/24 0400) Temp Source: Oral (10/24 0400) BP: 109/65 mmHg (10/24 0600) Pulse Rate: 75 (10/24 0600)  Labs:  Recent Labs  06/27/14 0141 06/27/14 0420 06/27/14 1300 06/27/14 2100 06/28/14 0402 06/28/14 0630  HGB 14.1  --   --   --  12.6  --   HCT 41.7  --   --   --  38.0  --   PLT 151  --   --   --  151  --   APTT  --  29  --   --   --   --   LABPROT  --  13.3  --   --   --   --   INR  --  1.00  --   --   --   --   HEPARINUNFRC  --   --  1.18* 0.92*  --  0.72*  CREATININE 1.30*  --   --   --  1.21*  --   TROPONINI <0.30  --   --   --   --   --     Estimated Creatinine Clearance: 37 ml/min (by C-G formula based on Cr of 1.21).   Medical History: Past Medical History  Diagnosis Date  . PAF (paroxysmal atrial fibrillation)   . LVH (left ventricular hypertrophy)   . Subarachnoid hemorrhage   . Hyperlipidemia   . HTN (hypertension)     Essential    Medications:  Scheduled:  . Influenza vac split quadrivalent PF  0.5 mL Intramuscular Tomorrow-1000  . pneumococcal 23 valent vaccine  0.5 mL Intramuscular Tomorrow-1000  . sodium chloride  3 mL Intravenous Q12H   Infusions:  . heparin      Assessment: 4 yoF presented with ShOB, was found to have bilateral PE.  Note history of subarachnoid hemorrhage, therefore tPA contraindicated.  Orders received to begin IV heparin with pharmacy dosing assistance.   Goal of Therapy:  Heparin level 0.3-0.7 units/ml Monitor platelets by anticoagulation protocol: Yes  10/24:  Heparin level improved this AM (0.72) but still supratherapeutic.  Heparin infusion at 750 units/hr.    No bleeding per RN report.  H/H and pltc OK.  Plan: 1. Reduce Heparin to 650  units/hr 2. Recheck Heparin level at 1:30 pm  Clayburn Pert, PharmD, BCPS Pager: [phone] 06/28/2014  7:12 AM

## 2014-06-28 NOTE — Progress Notes (Signed)
[facility] PROGRESS NOTE  Monique [name] OIZ:[id number] DOB: [birth year] DOA: 06/27/2014 PCP: Adella Hare, MD  Assessment/Plan  Acute respiratory failure with hypoxia due to submassive pulmonary embolism, bilateral.  History of aneurysm with intracranial hemorrhage patient is NOT a candidate for tPA.   - BNP elevated but troponin negative -  ECHO, severe LVH, normal EF, severe LVH, moderate to severe RV dysfunction, mildly dilated RV, no obvious right to left shunt -  Wean O2 as tolearted -  Cont heparin gtt -  Hypercoag panel pending -  Malignancy work up as outpatient -  Duplex lower extremity:  Acute DVT of the proximal profunda and proximal femoral veins coursing through the common femoral veins. These areas are nearly occluded. No evidence of DVT on the left.  Essential hypertension, blood pressure low normal.  Continue to hold BP meds  Hypertrophic obstructive cardiomyopathy (HOCM)  PAROXYSMAL ATRIAL FIBRILLATION -  telemetry Subarachnoid hemorrhage 2000  SOB (shortness of breath)  History of fibromuscular dysplasia per intracranial/vertebral angiogram done in 2000  Cerebral aneurysm 2000  Diet:  Healthy heart Access:  PIV IVF:  off Proph:  Heparin gtt  Code Status: full Family Communication: patient alone Disposition Plan:  Weaning oxygen.  Will need to transition to warfarin as outpatient   Consultants:  Hematology  PCCM by phone  Procedures:  CXR  CT angio chest:  Bilateral PE with evidence of right heart strain  Antibiotics:  none   HPI/Subjective:  Feeling well on nonrebreather.  Oxygen was not weaned yesterday.    Objective: Filed Vitals:   06/28/14 0300 06/28/14 0400 06/28/14 0500 06/28/14 0600  BP: 99/67 123/83 116/78 109/65  Pulse: 75 76 72 75  Temp:  97.8 F (36.6 C)    TempSrc:  Oral    Resp: 15 17 12 13   Height:      Weight:  68.72 kg (151 lb 8 oz)    SpO2: 100% 98% 100% 100%    Intake/Output Summary (Last 24 hours) at  06/28/14 0718 Last data filed at 06/28/14 0600  Gross per 24 hour  Intake  225.5 ml  Output    700 ml  Net -474.5 ml   Filed Weights   06/27/14 0430 06/27/14 0632 06/28/14 0400  Weight: 73.483 kg (162 lb) 67.9 kg (149 lb 11.1 oz) 68.72 kg (151 lb 8 oz)    Exam:   General:  Thin BF, less breathless today on NRB  HEENT:  NCAT, MMM  Cardiovascular:  RRR, nl S1, S2 no mrg, 2+ pulses, warm extremities  Respiratory:  CTAB, no increased WOB  Abdomen:   NABS, soft, NT/ND  MSK:   Normal tone and bulk, trace edema of the RLE  Neuro:  Grossly intact  Data Reviewed: Basic Metabolic Panel:  Recent Labs Lab 06/27/14 0141 06/28/14 0402  NA 144 141  K 4.1 3.8  CL 106 106  CO2 23 25  GLUCOSE 214* 120*  BUN 20 18  CREATININE 1.30* 1.21*  CALCIUM 9.8 9.0   Liver Function Tests:  Recent Labs Lab 06/27/14 0141  AST 40*  ALT 21  ALKPHOS 100  BILITOT 0.7  PROT 7.7  ALBUMIN 3.8   No results found for this basename: LIPASE, AMYLASE,  in the last 168 hours No results found for this basename: AMMONIA,  in the last 168 hours CBC:  Recent Labs Lab 06/27/14 0141 06/28/14 0402  WBC 8.6 10.2  NEUTROABS 6.4  --   HGB 14.1 12.6  HCT 41.7 38.0  MCV 93.9 94.1  PLT 151 151   Cardiac Enzymes:  Recent Labs Lab 06/27/14 0141  TROPONINI <0.30   BNP (last 3 results)  Recent Labs  06/27/14 0141  PROBNP 3231.0*   CBG: No results found for this basename: GLUCAP,  in the last 168 hours  Recent Results (from the past 240 hour(s))  MRSA PCR SCREENING     Status: None   Collection Time    06/27/14  6:45 AM      Result Value Ref Range Status   MRSA by PCR NEGATIVE  NEGATIVE Final   Comment:            The GeneXpert MRSA Assay (FDA     approved for NASAL specimens     only), is one component of a     comprehensive MRSA colonization     surveillance program. It is not     intended to diagnose MRSA     infection nor to guide or     monitor treatment for     MRSA  infections.     Performed at [facility]     Studies: Dg Chest 2 View  06/27/2014   CLINICAL DATA:  Shortness of breath for 1 week, worsening today. Upper chest pain and low oxygen saturations.  EXAM: CHEST  2 VIEW  COMPARISON:  05/20/2008  FINDINGS: Normal heart size and pulmonary vascularity. No focal airspace disease or consolidation in the lungs. No blunting of costophrenic angles. No pneumothorax. Mediastinal contours appear intact. Tortuous aorta.  IMPRESSION: No active cardiopulmonary disease.   Electronically Signed   By: Lucienne Capers M.D.   On: 06/27/2014 01:49   Ct Angio Chest Pe W/cm &/or Wo Cm  06/27/2014   CLINICAL DATA:  Shortness of breath increasing with exertion. Central chest pain. Elevated D-dimer.  EXAM: CT ANGIOGRAPHY CHEST WITH CONTRAST  TECHNIQUE: Multidetector CT imaging of the chest was performed using the standard protocol during bolus administration of intravenous contrast. Multiplanar CT image reconstructions and MIPs were obtained to evaluate the vascular anatomy.  CONTRAST:  12mL OMNIPAQUE IOHEXOL 350 MG/ML SOLN  COMPARISON:  CT abdomen and pelvis 01/09/2013  FINDINGS: Technically adequate study with good opacification of the central and segmental pulmonary arteries. Multiple filling defects are demonstrated in the lobar and proximal segmental pulmonary arteries consistent with multiple pulmonary emboli. Increased RV to LV ratio suggest possibility of right heart strain.  Normal caliber thoracic aorta. No significant lymphadenopathy in the chest. Esophagus is decompressed. Lungs are clear. Airways are patent. No pleural effusion. No pneumothorax. Included portions of the upper abdominal organs are grossly unremarkable. Degenerative changes in the spine.  Review of the MIP images confirms the above findings.  IMPRESSION: Positive study for multiple bilateral central and segmental pulmonary emboli. Increased RV LV ratio suggest possible right heart strain.  These  results were called by telephone at the time of interpretation on 06/27/2014 at 4:12 am to Dr. Delora Fuel , who verbally acknowledged these results.   Electronically Signed   By: Lucienne Capers M.D.   On: 06/27/2014 04:17    Scheduled Meds: . Influenza vac split quadrivalent PF  0.5 mL Intramuscular Tomorrow-1000  . pneumococcal 23 valent vaccine  0.5 mL Intramuscular Tomorrow-1000  . sodium chloride  3 mL Intravenous Q12H   Continuous Infusions: . heparin      Principal Problem:   Pulmonary embolism, bilateral Active Problems:   Essential hypertension   Hypertrophic obstructive cardiomyopathy (HOCM)   PAROXYSMAL ATRIAL FIBRILLATION  Subarachnoid hemorrhage 2000   SOB (shortness of breath)   Acute respiratory failure with hypoxia   Pulmonary embolism   History of fibromuscular dysplasia   Cerebral aneurysm 2000    Time spent: 30 min    Keni Elison, [place] [facility] Pager [phone]. If 7PM-7AM, please contact night-coverage at [email or web address], password [facility] 06/28/2014, 7:18 AM  LOS: 1 day

## 2014-06-29 LAB — CBC
HEMATOCRIT: 38.1 % (ref 36.0–46.0)
Hemoglobin: 12.8 g/dL (ref 12.0–15.0)
MCH: 31.4 pg (ref 26.0–34.0)
MCHC: 33.6 g/dL (ref 30.0–36.0)
MCV: 93.4 fL (ref 78.0–100.0)
Platelets: 152 10*3/uL (ref 150–400)
RBC: 4.08 MIL/uL (ref 3.87–5.11)
RDW: 13.3 % (ref 11.5–15.5)
WBC: 9.9 10*3/uL (ref 4.0–10.5)

## 2014-06-29 LAB — HEPARIN LEVEL (UNFRACTIONATED): HEPARIN UNFRACTIONATED: 0.34 [IU]/mL (ref 0.30–0.70)

## 2014-06-29 MED ORDER — COUMADIN BOOK
Freq: Once | Status: DC
  Filled 2014-06-29 (×2): qty 1

## 2014-06-29 MED ORDER — WARFARIN - PHARMACIST DOSING INPATIENT: Freq: Every day | Status: DC

## 2014-06-29 MED ORDER — POLYETHYLENE GLYCOL 3350 17 G PO PACK
17.0000 g | PACK | Freq: Every day | ORAL | Status: DC
Start: 2014-06-29 — End: 2014-07-01
  Administered 2014-06-29 – 2014-07-01 (×3): 17 g via ORAL
  Filled 2014-06-29 (×3): qty 1

## 2014-06-29 MED ORDER — DOCUSATE SODIUM 100 MG PO CAPS
100.0000 mg | ORAL_CAPSULE | Freq: Two times a day (BID) | ORAL | Status: DC
  Administered 2014-06-29 – 2014-07-01 (×5): 100 mg via ORAL
  Filled 2014-06-29 (×6): qty 1

## 2014-06-29 MED ORDER — SENNA 8.6 MG PO TABS
2.0000 | ORAL_TABLET | Freq: Every day | ORAL | Status: DC
  Administered 2014-06-29 – 2014-06-30 (×2): 17.2 mg via ORAL
  Filled 2014-06-29 (×2): qty 2

## 2014-06-29 MED ORDER — WARFARIN VIDEO: Freq: Once | Status: DC

## 2014-06-29 MED ORDER — WARFARIN SODIUM 6 MG PO TABS
6.0000 mg | ORAL_TABLET | Freq: Once | ORAL | Status: AC
  Administered 2014-06-29: 6 mg via ORAL
  Filled 2014-06-29 (×2): qty 1

## 2014-06-29 NOTE — Progress Notes (Addendum)
[facility] PROGRESS NOTE  Monique [name] LEX:[id number] DOB: 04-12-1936 DOA: 06/27/2014 PCP: Adella Hare, MD  Assessment/Plan  Acute respiratory failure with hypoxia due to submassive pulmonary embolism, bilateral.  History of aneurysm with intracranial hemorrhage patient is NOT a candidate for tPA.  Oxygen down to 4L [place] this AM  - BNP elevated but troponin negative -  ECHO, severe LVH, normal EF, severe LVH, moderate to severe RV dysfunction, mildly dilated RV, no obvious right to left shunt -  Wean O2 as tolearted -  Cont heparin gtt -  Hypercoag panel:  Prothrombin gene, cardiolipin antibody, factor V Leiden, beta-2 glycoprotein, lupus anticoagulant, protein S total, protein S activity, protein C total, protein C activity are pending -  Homocysteine wnl, AT III fx wnl,  -  Malignancy work up as outpatient -  Duplex lower extremity:  Acute DVT of the proximal profunda and proximal femoral veins coursing through the common femoral veins. These areas are nearly occluded. No evidence of DVT on the left. -  Start warfarin -  OOB  Essential hypertension, blood pressure low normal.  Continue to hold BP meds  Hypertrophic obstructive cardiomyopathy (HOCM)  PAROXYSMAL ATRIAL FIBRILLATION -  Telemetry:  NSR Subarachnoid hemorrhage 2000  SOB (shortness of breath)  History of fibromuscular dysplasia per intracranial/vertebral angiogram done in 2000  Cerebral aneurysm 2000  Diet:  Healthy heart Access:  PIV IVF:  off Proph:  Heparin gtt  Code Status: full Family Communication: patient alone Disposition Plan:  Weaning oxygen.  Will need to transition to warfarin as outpatient   Consultants:  Hematology  PCCM by phone  Procedures:  CXR  CT angio chest:  Bilateral PE with evidence of right heart strain  Antibiotics:  none   HPI/Subjective:  Oxygen down to 4L.  Coughing some, but not SOB while on oxygen.  Denies wheeze, swelling, chest pain.     Objective: Filed Vitals:   06/29/14 0400 06/29/14 0500 06/29/14 0600 06/29/14 0700  BP:   131/83   Pulse: 87 86 84 88  Temp:      TempSrc:      Resp:    17  Height:      Weight:      SpO2: 95% 96% 96% 96%    Intake/Output Summary (Last 24 hours) at 06/29/14 0728 Last data filed at 06/29/14 0700  Gross per 24 hour  Intake    412 ml  Output    725 ml  Net   -313 ml   Filed Weights   06/27/14 0430 06/27/14 0632 06/28/14 0400  Weight: 73.483 kg (162 lb) 67.9 kg (149 lb 11.1 oz) 68.72 kg (151 lb 8 oz)    Exam:   General:  Thin BF, less breathless today on NRB  HEENT:  NCAT, MMM  Cardiovascular:  RRR, nl S1, S2, 2/6 systolic murmur RUSB, 2+ pulses, warm extremities  Respiratory:  CTAB, no increased WOB  Abdomen:   NABS, soft, NT/ND  MSK:   Normal tone and bulk, trace edema of the RLE  Neuro:  Grossly intact  Data Reviewed: Basic Metabolic Panel:  Recent Labs Lab 06/27/14 0141 06/28/14 0402  NA 144 141  K 4.1 3.8  CL 106 106  CO2 23 25  GLUCOSE 214* 120*  BUN 20 18  CREATININE 1.30* 1.21*  CALCIUM 9.8 9.0   Liver Function Tests:  Recent Labs Lab 06/27/14 0141  AST 40*  ALT 21  ALKPHOS 100  BILITOT 0.7  PROT 7.7  ALBUMIN 3.8  No results found for this basename: LIPASE, AMYLASE,  in the last 168 hours No results found for this basename: AMMONIA,  in the last 168 hours CBC:  Recent Labs Lab 06/27/14 0141 06/28/14 0402 06/29/14 0358  WBC 8.6 10.2 9.9  NEUTROABS 6.4  --   --   HGB 14.1 12.6 12.8  HCT 41.7 38.0 38.1  MCV 93.9 94.1 93.4  PLT 151 151 152   Cardiac Enzymes:  Recent Labs Lab 06/27/14 0141  TROPONINI <0.30   BNP (last 3 results)  Recent Labs  06/27/14 0141  PROBNP 3231.0*   CBG: No results found for this basename: GLUCAP,  in the last 168 hours  Recent Results (from the past 240 hour(s))  MRSA PCR SCREENING     Status: None   Collection Time    06/27/14  6:45 AM      Result Value Ref Range Status   MRSA by  PCR NEGATIVE  NEGATIVE Final   Comment:            The GeneXpert MRSA Assay (FDA     approved for NASAL specimens     only), is one component of a     comprehensive MRSA colonization     surveillance program. It is not     intended to diagnose MRSA     infection nor to guide or     monitor treatment for     MRSA infections.     Performed at [facility]     Studies: No results found.  Scheduled Meds: . antiseptic oral rinse  7 mL Mouth Rinse BID  . docusate sodium  100 mg Oral BID  . pneumococcal 23 valent vaccine  0.5 mL Intramuscular Tomorrow-1000  . polyethylene glycol  17 g Oral Daily  . senna  2 tablet Oral QHS  . sodium chloride  3 mL Intravenous Q12H   Continuous Infusions: . heparin 750 Units/hr (06/28/14 1427)    Principal Problem:   Pulmonary embolism, bilateral Active Problems:   Essential hypertension   Hypertrophic obstructive cardiomyopathy (HOCM)   PAROXYSMAL ATRIAL FIBRILLATION   Subarachnoid hemorrhage 2000   SOB (shortness of breath)   Acute respiratory failure with hypoxia   Pulmonary embolism   History of fibromuscular dysplasia   Cerebral aneurysm 2000    Time spent: 30 min    Monique [name], [place] [facility] Pager [phone]. If 7PM-7AM, please contact night-coverage at [email or web address], password [facility] 06/29/2014, 7:28 AM  LOS: 2 days

## 2014-06-29 NOTE — Progress Notes (Signed)
[place] for IV Hepairn Indication: pulmonary embolus  No Known Allergies  Patient Measurements: Height: 5\' 6"  (167.6 cm) Weight: 151 lb 8 oz (68.72 kg) IBW/kg (Calculated) : 59.3   Vital Signs: Temp: 97.6 F (36.4 C) (10/25 0000) Temp Source: Oral (10/25 0000) BP: 133/81 mmHg (10/25 0200) Pulse Rate: 88 (10/25 0300)  Labs:  Recent Labs  06/27/14 0141 06/27/14 0420  06/28/14 0402  06/28/14 1339 06/28/14 2023 06/29/14 0358  HGB 14.1  --   --  12.6  --   --   --  12.8  HCT 41.7  --   --  38.0  --   --   --  38.1  PLT 151  --   --  151  --   --   --  152  APTT  --  29  --   --   --   --   --   --   LABPROT  --  13.3  --   --   --   --   --   --   INR  --  1.00  --   --   --   --   --   --   HEPARINUNFRC  --   --   < >  --   < > 0.23* 0.30 0.34  CREATININE 1.30*  --   --  1.21*  --   --   --   --   TROPONINI <0.30  --   --   --   --   --   --   --   < > = values in this interval not displayed.  Estimated Creatinine Clearance: 37 ml/min (by C-G formula based on Cr of 1.21).   Medical History: Past Medical History  Diagnosis Date  . PAF (paroxysmal atrial fibrillation)   . LVH (left ventricular hypertrophy)   . Subarachnoid hemorrhage   . Hyperlipidemia   . HTN (hypertension)     Essential    Medications:  Scheduled:  . antiseptic oral rinse  7 mL Mouth Rinse BID  . pneumococcal 23 valent vaccine  0.5 mL Intramuscular Tomorrow-1000  . sodium chloride  3 mL Intravenous Q12H   Infusions:  . heparin 750 Units/hr (06/28/14 1427)    Assessment: 89 yoF presented with ShOB, was found to have bilateral PE.  Note history of subarachnoid hemorrhage, therefore tPA contraindicated.  Orders received to begin IV heparin with pharmacy dosing assistance.   Goal of Therapy:  Heparin level 0.3-0.7 units/ml Monitor platelets by anticoagulation protocol: Yes  Today, 10/25:  Heparin level therapeutic (0.34) on 750 units/hr IV  infusion.  No bleeding reported in latest chart notes.  Hgb and pltc WNL.  Plan: 1. Continue heparin at 750 units/hr. 2. Heparin level and CBC daily. 3. Follow clinical course.  Clayburn Pert, PharmD, BCPS Pager: [phone] 06/29/2014  7:05 AM

## 2014-06-29 NOTE — Progress Notes (Signed)
ANTICOAGULATION CONSULT NOTE   Pharmacy Consult for Heparin, Warfarin Indication: pulmonary embolus  No Known Allergies  Patient Measurements: Height: 5\' 6"  (167.6 cm) Weight: 151 lb 8 oz (68.72 kg) IBW/kg (Calculated) : 59.3   Vital Signs: Temp: 97.6 F (36.4 C) (10/25 0000) Temp Source: Oral (10/25 0000) BP: 131/83 mmHg (10/25 0600) Pulse Rate: 88 (10/25 0700)  Labs:  Recent Labs  06/27/14 0141 06/27/14 0420  06/28/14 0402  06/28/14 1339 06/28/14 2023 06/29/14 0358  HGB 14.1  --   --  12.6  --   --   --  12.8  HCT 41.7  --   --  38.0  --   --   --  38.1  PLT 151  --   --  151  --   --   --  152  APTT  --  29  --   --   --   --   --   --   LABPROT  --  13.3  --   --   --   --   --   --   INR  --  1.00  --   --   --   --   --   --   HEPARINUNFRC  --   --   < >  --   < > 0.23* 0.30 0.34  CREATININE 1.30*  --   --  1.21*  --   --   --   --   TROPONINI <0.30  --   --   --   --   --   --   --   < > = values in this interval not displayed.  Estimated Creatinine Clearance: 37 ml/min (by C-G formula based on Cr of 1.21).   Medical History: Past Medical History  Diagnosis Date  . PAF (paroxysmal atrial fibrillation)   . LVH (left ventricular hypertrophy)   . Subarachnoid hemorrhage   . Hyperlipidemia   . HTN (hypertension)     Essential    Medications:  Scheduled:  . antiseptic oral rinse  7 mL Mouth Rinse BID  . docusate sodium  100 mg Oral BID  . pneumococcal 23 valent vaccine  0.5 mL Intramuscular Tomorrow-1000  . polyethylene glycol  17 g Oral Daily  . senna  2 tablet Oral QHS  . sodium chloride  3 mL Intravenous Q12H   Infusions:  . heparin 750 Units/hr (06/28/14 1427)    Assessment: Monique [name] presented with ShOB, was found to have bilateral PE.  Note history of subarachnoid hemorrhage, therefore tPA contraindicated.  Orders received to begin IV heparin with pharmacy dosing assistance.   Goal of Therapy:  Heparin level 0.3-0.7 units/ml Monitor  platelets by anticoagulation protocol: Yes  Today, 10/25:  Heparin level therapeutic (0.34) on 750 units/hr IV infusion.  No bleeding reported in latest chart notes.  Hgb and pltc WNL.  Orders received to add warfarin starting today.  Warfarin nomogram recommends an initial dose of 7.5 mg PO x 1 but am concerned that patient may require slightly smaller initiation dosages due to age and acute illness.  Plan: 1. Continue heparin at 750 units/hr. 2. Warfarin 6 mg PO x 1 today at 6pm. 3. Heparin level, PT/INR, and CBC daily. 4. Follow clinical course. 5. Plan patient teaching regarding warfarin.  Clayburn Pert, PharmD, BCPS Pager: [phone] 06/29/2014  7:44 AM

## 2014-06-30 ENCOUNTER — Telehealth: Payer: Self-pay | Admitting: Internal Medicine

## 2014-06-30 LAB — LUPUS ANTICOAGULANT PANEL
DRVVT: 37.5 s (ref <42.9)
LUPUS ANTICOAGULANT: NOT DETECTED
PTT LA: 144.3 s — AB (ref 28.0–43.0)
PTTLA 41 MIX: 148.7 s — AB (ref 28.0–43.0)
PTTLA Confirmation: 0 secs (ref <8.0)

## 2014-06-30 LAB — PROTEIN C ACTIVITY: Protein C Activity: 155 % — ABNORMAL HIGH (ref 75–133)

## 2014-06-30 LAB — CBC
HCT: 40 % (ref 36.0–46.0)
Hemoglobin: 13.4 g/dL (ref 12.0–15.0)
MCH: 31.1 pg (ref 26.0–34.0)
MCHC: 33.5 g/dL (ref 30.0–36.0)
MCV: 92.8 fL (ref 78.0–100.0)
PLATELETS: 166 10*3/uL (ref 150–400)
RBC: 4.31 MIL/uL (ref 3.87–5.11)
RDW: 13.1 % (ref 11.5–15.5)
WBC: 7.9 10*3/uL (ref 4.0–10.5)

## 2014-06-30 LAB — PROTEIN S ACTIVITY: PROTEIN S ACTIVITY: 137 % — AB (ref 69–129)

## 2014-06-30 LAB — PROTIME-INR
INR: 1.03 (ref 0.00–1.49)
Prothrombin Time: 13.6 seconds (ref 11.6–15.2)

## 2014-06-30 LAB — PROTEIN C, TOTAL: Protein C, Total: 83 % (ref 72–160)

## 2014-06-30 LAB — PROTEIN S, TOTAL: Protein S Ag, Total: 111 % (ref 60–150)

## 2014-06-30 LAB — HEPARIN LEVEL (UNFRACTIONATED): HEPARIN UNFRACTIONATED: 0.29 [IU]/mL — AB (ref 0.30–0.70)

## 2014-06-30 MED ORDER — ENOXAPARIN SODIUM 80 MG/0.8ML ~~LOC~~ SOLN
70.0000 mg | Freq: Two times a day (BID) | SUBCUTANEOUS | Status: DC
  Administered 2014-07-01: 70 mg via SUBCUTANEOUS
  Filled 2014-06-30 (×3): qty 0.8

## 2014-06-30 MED ORDER — WARFARIN SODIUM 6 MG PO TABS
6.0000 mg | ORAL_TABLET | Freq: Once | ORAL | Status: AC
  Administered 2014-06-30: 6 mg via ORAL
  Filled 2014-06-30: qty 1

## 2014-06-30 MED ORDER — ENOXAPARIN (LOVENOX) PATIENT EDUCATION KIT
PACK | Freq: Once | Status: AC
  Administered 2014-06-30: 17:00:00
  Filled 2014-06-30: qty 1

## 2014-06-30 MED ORDER — METOPROLOL SUCCINATE ER 50 MG PO TB24
50.0000 mg | ORAL_TABLET | Freq: Every day | ORAL | Status: DC
  Administered 2014-06-30 – 2014-07-01 (×2): 50 mg via ORAL
  Filled 2014-06-30 (×2): qty 1

## 2014-06-30 MED ORDER — HEPARIN (PORCINE) IN NACL 100-0.45 UNIT/ML-% IJ SOLN
850.0000 [IU]/h | INTRAMUSCULAR | Status: AC
  Filled 2014-06-30 (×2): qty 250

## 2014-06-30 MED ORDER — ENOXAPARIN SODIUM 80 MG/0.8ML ~~LOC~~ SOLN
70.0000 mg | Freq: Once | SUBCUTANEOUS | Status: AC
  Administered 2014-06-30: 70 mg via SUBCUTANEOUS
  Filled 2014-06-30: qty 0.8

## 2014-06-30 NOTE — Progress Notes (Addendum)
[facility] PROGRESS NOTE  Monique [name] JQB:[id number] DOB: Jan 12, 1936 DOA: 06/27/2014 PCP: Adella Hare, MD  Assessment/Plan  Acute respiratory failure with hypoxia due to submassive pulmonary embolism, bilateral with right heart strain and acute DVT in RLE.  History of aneurysm with intracranial hemorrhage patient was NOT a candidate for tPA.  Down to 2L [place]. -  Wean O2 as tolearted -  Transition to lovenox from heparin -  Start lovenox teaching -  Hypercoag panel:  Prothrombin gene, cardiolipin antibody, factor V Leiden, beta-2 glycoprotein, lupus anticoagulant, protein S total, protein S activity, protein C total, protein C activity are pending -  Homocysteine wnl, AT III fx wnl,  -  Malignancy work up as outpatient -  Continue warfarin -  Will need a/c f/u as outpatient -  OOB  Acute DVT of the proximal profunda and proximal femoral veins coursing through the common femoral veins. These areas are nearly occluded. No evidence of DVT on the left. -  A/c as above -  TED hose to right leg for swelling  Right heart strain:  ECHO severe LVH, normal EF, severe LVH, moderate to severe RV dysfunction, mildly dilated RV, no obvious right to left shunt -  Given HOCM and now submassive PE with right heart strain, patient at risk for development of right heart failure.  She will be preload dependent so should avoid dehydration.   -  She will need close f/u with cardiology for ongoing monitoring and management.  Essential hypertension, blood pressure rising.   -  Resume BB at lower dose and will add/increase medications as needed  Hypertrophic obstructive cardiomyopathy (HOCM)   PAROXYSMAL ATRIAL FIBRILLATION.   -  Telemetry:  NSR -  Okay to d/c telemetry  Subarachnoid hemorrhage 2000  History of fibromuscular dysplasia per intracranial/vertebral angiogram done in 2000  Cerebral aneurysm 2000  Diet:  Healthy heart Access:  PIV IVF:  off Proph:  Heparin gtt >>  lovenox  Code Status: full Family Communication: patient alone Disposition Plan:  Weaning oxygen.  Will need to transition to warfarin as outpatient.  May need home O2 and possibly [place] RN   Consultants:  Hematology  PCCM by phone  Procedures:  CXR  CT angio chest:  Bilateral PE with evidence of right heart strain  Antibiotics:  none   HPI/Subjective:  Oxygen down to 2L and has been able to walk to bathroom without significant dyspnea.  Coughing some but no hemoptysis    Objective: Filed Vitals:   06/30/14 0705 06/30/14 0730 06/30/14 1236 06/30/14 1432  BP: 152/86 154/98 130/106 121/74  Pulse: 96 96 99 91  Temp: 97.9 F (36.6 C) 97.4 F (36.3 C)  97.8 F (36.6 C)  TempSrc: Oral Oral  Oral  Resp: 18 17  18   Height:      Weight: 71.6 kg (157 lb 13.6 oz)     SpO2: 97% 97% 93% 96%    Intake/Output Summary (Last 24 hours) at 06/30/14 1440 Last data filed at 06/30/14 1433  Gross per 24 hour  Intake    997 ml  Output    550 ml  Net    447 ml   Filed Weights   06/27/14 0632 06/28/14 0400 06/30/14 0705  Weight: 67.9 kg (149 lb 11.1 oz) 68.72 kg (151 lb 8 oz) 71.6 kg (157 lb 13.6 oz)    Exam:   General:  Thin BF, NAD on 2L [place]  HEENT:  NCAT, MMM  Cardiovascular:  RRR, nl S1, S2,  2/6 systolic murmur RUSB, 2+ pulses, warm extremities  Respiratory:  CTAB, no increased WOB  Abdomen:   NABS, soft, NT/ND  MSK:   Normal tone and bulk, trace edema of the RLE  Neuro:  Grossly intact  Data Reviewed: Basic Metabolic Panel:  Recent Labs Lab 06/27/14 0141 06/28/14 0402  NA 144 141  K 4.1 3.8  CL 106 106  CO2 23 25  GLUCOSE 214* 120*  BUN 20 18  CREATININE 1.30* 1.21*  CALCIUM 9.8 9.0   Liver Function Tests:  Recent Labs Lab 06/27/14 0141  AST 40*  ALT 21  ALKPHOS 100  BILITOT 0.7  PROT 7.7  ALBUMIN 3.8   No results found for this basename: LIPASE, AMYLASE,  in the last 168 hours No results found for this basename: AMMONIA,  in the last 168  hours CBC:  Recent Labs Lab 06/27/14 0141 06/28/14 0402 06/29/14 0358 06/30/14 0413  WBC 8.6 10.2 9.9 7.9  NEUTROABS 6.4  --   --   --   HGB 14.1 12.6 12.8 13.4  HCT 41.7 38.0 38.1 40.0  MCV 93.9 94.1 93.4 92.8  PLT 151 151 152 166   Cardiac Enzymes:  Recent Labs Lab 06/27/14 0141  TROPONINI <0.30   BNP (last 3 results)  Recent Labs  06/27/14 0141  PROBNP 3231.0*   CBG: No results found for this basename: GLUCAP,  in the last 168 hours  Recent Results (from the past 240 hour(s))  MRSA PCR SCREENING     Status: None   Collection Time    06/27/14  6:45 AM      Result Value Ref Range Status   MRSA by PCR NEGATIVE  NEGATIVE Final   Comment:            The GeneXpert MRSA Assay (FDA     approved for NASAL specimens     only), is one component of a     comprehensive MRSA colonization     surveillance program. It is not     intended to diagnose MRSA     infection nor to guide or     monitor treatment for     MRSA infections.     Performed at [facility]     Studies: No results found.  Scheduled Meds: . antiseptic oral rinse  7 mL Mouth Rinse BID  . coumadin book   Does not apply Once  . docusate sodium  100 mg Oral BID  . enoxaparin (LOVENOX) injection  70 mg Subcutaneous Once  . [START ON 07/01/2014] enoxaparin (LOVENOX) injection  70 mg Subcutaneous Q12H  . enoxaparin   Does not apply Once  . metoprolol succinate  50 mg Oral Daily  . pneumococcal 23 valent vaccine  0.5 mL Intramuscular Tomorrow-1000  . polyethylene glycol  17 g Oral Daily  . senna  2 tablet Oral QHS  . sodium chloride  3 mL Intravenous Q12H  . warfarin  6 mg Oral ONCE-1800  . warfarin   Does not apply Once  . Warfarin - Pharmacist Dosing Inpatient   Does not apply q1800   Continuous Infusions: . heparin 850 Units/hr (06/30/14 0759)    Principal Problem:   Pulmonary embolism, bilateral Active Problems:   Essential hypertension   Hypertrophic obstructive cardiomyopathy  (HOCM)   PAROXYSMAL ATRIAL FIBRILLATION   Subarachnoid hemorrhage 2000   SOB (shortness of breath)   Acute respiratory failure with hypoxia   Pulmonary embolism   History of fibromuscular dysplasia  Cerebral aneurysm 2000    Time spent: 30 min    Nakhi Choi, [facility] Pager [phone]. If 7PM-7AM, please contact night-coverage at [email or web address], password [facility] 06/30/2014, 2:40 PM  LOS: 3 days

## 2014-06-30 NOTE — Telephone Encounter (Signed)
Left message informing the patient's son that the patient has had her pneumonia vaccine in 2013.

## 2014-06-30 NOTE — Progress Notes (Signed)
Pt gave herself the first lovenox injection tonight. The procedure was demonstrated and discussed with pt and the she demonstrated to the RN by giving herself the injection. Pt was able to visualize the air bubble and its importance was discussed and understood by pt. Pt was not able to see the lines on the syringe indicating the amount of medicine. The RN had to expel the correct amount of medicine for pt. It was discussed with pt that she may need to have her pharmacy assist with ensuring the dose in her syringes are correct. Pt encouraged to continue to practice giving the injections while she is in the hospital and to review the Lovenox starter kit that was provided.  Monique [name] [facility] 06/30/2014 7:02 PM

## 2014-06-30 NOTE — Discharge Instructions (Addendum)
Information on my medicine - Coumadin®   (Warfarin) ° °This medication education was reviewed with me or my healthcare representative as part of my discharge preparation.  The pharmacist that spoke with me during my hospital stay was:  Zeigler, Dustin George, RPH ° °Why was Coumadin prescribed for you? °Coumadin was prescribed for you because you have a blood clot or a medical condition that can cause an increased risk of forming blood clots. Blood clots can cause serious health problems by blocking the flow of blood to the heart, lung, or brain. Coumadin can prevent harmful blood clots from forming. °As a reminder your indication for Coumadin is:   Pulmonary Embolism Treatment ° °What test will check on my response to Coumadin? °While on Coumadin (warfarin) you will need to have an INR test regularly to ensure that your dose is keeping you in the desired range. The INR (international normalized ratio) number is calculated from the result of the laboratory test called prothrombin time (PT). ° °If an INR APPOINTMENT HAS NOT ALREADY BEEN MADE FOR YOU please schedule an appointment to have this lab work done by your health care provider within 7 days. °Your INR goal is usually a number between:  2 to 3 or your provider may give you a more narrow range like 2-2.5.  Ask your health care provider during an office visit what your goal INR is. ° °What  do you need to  know  About  COUMADIN? °Take Coumadin (warfarin) exactly as prescribed by your healthcare provider about the same time each day.  DO NOT stop taking without talking to the doctor who prescribed the medication.  Stopping without other blood clot prevention medication to take the place of Coumadin may increase your risk of developing a new clot or stroke.  Get refills before you run out. ° °What do you do if you miss a dose? °If you miss a dose, take it as soon as you remember on the same day then continue your regularly scheduled regimen the next day.  Do not  take two doses of Coumadin at the same time. ° °Important Safety Information °A possible side effect of Coumadin (Warfarin) is an increased risk of bleeding. You should call your healthcare provider right away if you experience any of the following: °  Bleeding from an injury or your nose that does not stop. °  Unusual colored urine (red or dark brown) or unusual colored stools (red or black). °  Unusual bruising for unknown reasons. °  A serious fall or if you hit your head (even if there is no bleeding). ° °Some foods or medicines interact with Coumadin® (warfarin) and might alter your response to warfarin. To help avoid this: °  Eat a balanced diet, maintaining a consistent amount of Vitamin K. °  Notify your provider about major diet changes you plan to make. °  Avoid alcohol or limit your intake to 1 drink for women and 2 drinks for men per day. °(1 drink is 5 oz. wine, 12 oz. beer, or 1.5 oz. liquor.) ° °Make sure that ANY health care provider who prescribes medication for you knows that you are taking Coumadin (warfarin).  Also make sure the healthcare provider who is monitoring your Coumadin knows when you have started a new medication including herbals and non-prescription products. ° °Coumadin® (Warfarin)  Major Drug Interactions  °Increased Warfarin Effect Decreased Warfarin Effect  °Alcohol (large quantities) °Antibiotics (esp. Septra/Bactrim, Flagyl, Cipro) °Amiodarone (Cordarone) °Aspirin (ASA) °Cimetidine (Tagamet) °  Megestrol (Megace) °NSAIDs (ibuprofen, naproxen, etc.) °Piroxicam (Feldene) °Propafenone (Rythmol SR) °Propranolol (Inderal) °Isoniazid (INH) °Posaconazole (Noxafil) Barbiturates (Phenobarbital) °Carbamazepine (Tegretol) °Chlordiazepoxide (Librium) °Cholestyramine (Questran) °Griseofulvin °Oral Contraceptives °Rifampin °Sucralfate (Carafate) °Vitamin K  ° °Coumadin® (Warfarin) Major Herbal Interactions  °Increased Warfarin Effect Decreased Warfarin Effect  °Garlic °Ginseng °Ginkgo biloba  Coenzyme Q10 °Green tea °St. John’s wort   ° °Coumadin® (Warfarin) FOOD Interactions  °Eat a consistent number of servings per week of foods HIGH in Vitamin K °(1 serving = ½ cup)  °Collards (cooked, or boiled & drained) °Kale (cooked, or boiled & drained) °Mustard greens (cooked, or boiled & drained) °Parsley *serving size only = ¼ cup °Spinach (cooked, or boiled & drained) °Swiss chard (cooked, or boiled & drained) °Turnip greens (cooked, or boiled & drained)  °Eat a consistent number of servings per week of foods MEDIUM-HIGH in Vitamin K °(1 serving = 1 cup)  °Asparagus (cooked, or boiled & drained) °Broccoli (cooked, boiled & drained, or raw & chopped) °Brussel sprouts (cooked, or boiled & drained) *serving size only = ½ cup °Lettuce, raw (green leaf, endive, romaine) °Spinach, raw °Turnip greens, raw & chopped  ° °These websites have more information on Coumadin (warfarin):  www.coumadin.com; °www.ahrq.gov/consumer/coumadin.htm; ° ° °

## 2014-06-30 NOTE — Telephone Encounter (Signed)
Pt is currently in hospital and is set to establish care with [facility] in November. Pt son wants to know if she has ever had a pneumonia vacc because hospital physicians are requesting that she receives on after her discharge. Please contact son when request is reviewed so that an injection appt can be scheduled.

## 2014-06-30 NOTE — Progress Notes (Addendum)
SATURATION QUALIFICATIONS: (This note is used to comply with regulatory documentation for home oxygen)  Patient Saturations on Room Air at Rest = 93%  Patient Saturations on Room Air while Ambulating = 90-92%  Patient Saturations on 0 Liters of oxygen while Ambulating = N/A  Please briefly explain why patient needs home oxygen:  Pt ambulated about 269ft in the hallway. Oxygen saturations stayed between 90-92% on RA while ambulating. Pt did not appear to be short of breath, and she denied any feelings of difficulty breathing with ambulating. HR maintained 106-115 with ambulation. Left oxygen off after walking as O2 sats were 93% on RA once back in bed. Will continue to monitor pt's oxygen status.   Othella Boyer [facility] 06/30/2014 4:42 PM

## 2014-06-30 NOTE — Plan of Care (Signed)
Problem: Consults Goal: Diagnosis - Venous Thromboembolism (VTE) Choose a selection Outcome: Completed/Met Date Met:  06/30/14 PE (Pulmonary Embolism)

## 2014-06-30 NOTE — Progress Notes (Addendum)
ANTICOAGULATION CONSULT NOTE   Pharmacy Consult for Heparin, Warfarin Indication: pulmonary embolus/DVT  No Known Allergies  Patient Measurements: Height: 5\' 6"  (167.6 cm) Weight: 157 lb 13.6 oz (71.6 kg) IBW/kg (Calculated) : 59.3 Heparin dosing weight: 71.6kg   Vital Signs: Temp: 97.9 F (36.6 C) (10/26 0705) Temp Source: Oral (10/26 0705) BP: 152/86 mmHg (10/26 0705) Pulse Rate: 96 (10/26 0705)  Labs:  Recent Labs  06/28/14 0402  06/28/14 2023 06/29/14 0358 06/30/14 0413  HGB 12.6  --   --  12.8 13.4  HCT 38.0  --   --  38.1 40.0  PLT 151  --   --  152 166  LABPROT  --   --   --   --  13.6  INR  --   --   --   --  1.03  HEPARINUNFRC  --   < > 0.30 0.34 0.29*  CREATININE 1.21*  --   --   --   --   < > = values in this interval not displayed.  Estimated Creatinine Clearance: 40.1 ml/min (by C-G formula based on Cr of 1.21).  Infusions:  . heparin 750 Units/hr (06/29/14 2040)    Assessment: 73 yoF presented with ShOB, was found to have bilateral PE.  Note history of subarachnoid hemorrhage, therefore tPA contraindicated.  Orders received to begin IV heparin with pharmacy dosing assistance.   Goal of Therapy:  Heparin level 0.3-0.7 units/ml Monitor platelets by anticoagulation protocol: Yes  Today, 10/26:  Heparin level SUBtherapeutic (0.29) on 750 units/hr IV infusion.  INR = 1.03 following 1st dose of warfarin last evening.    No bleeding reported in latest chart notes.  Hgb and pltc WNL.  Warfarin nomogram recommends an initial dose of 7.5 mg PO x 1 but concerned that patient may require slightly smaller initiation dosages due to age and acute illness.  Diet: cardiac  No drug interactions noted  Plan: Day #2 (of minimum of 5-day overlap for VTE core measure) 1. Increase heparin to 850 units/hr. 2. Warfarin 6 mg PO x 1 today at 6pm. 3. Heparin level, PT/INR, and CBC daily. 4. Follow clinical course. 5. Plan patient teaching regarding  warfarin.  Doreene Eland, PharmD, BCPS.   Pager: [phone] 06/30/2014  7:44 AM  _______________________________________________________________ Addendum:  Per MD, ok to transition to lovenox therapy this evening from heparin gtt.   Plan:  D/C heparin at 17:00  D/C heparin labs, including 16:30 level  Start enoxaparin 70mg  SQ q124h (1mg /kg) at 18:00 this evening  CBC q72h while on LMWH in hospital  Warfarin 6mg  x 1 po as above   Doreene Eland, PharmD, BCPS.   Pager: [phone]  06/30/2014 .12:14 PM

## 2014-07-01 LAB — PROTHROMBIN GENE MUTATION

## 2014-07-01 LAB — CBC
HCT: 41.1 % (ref 36.0–46.0)
Hemoglobin: 13.4 g/dL (ref 12.0–15.0)
MCH: 31 pg (ref 26.0–34.0)
MCHC: 32.6 g/dL (ref 30.0–36.0)
MCV: 95.1 fL (ref 78.0–100.0)
PLATELETS: 173 10*3/uL (ref 150–400)
RBC: 4.32 MIL/uL (ref 3.87–5.11)
RDW: 13.5 % (ref 11.5–15.5)
WBC: 6.7 10*3/uL (ref 4.0–10.5)

## 2014-07-01 LAB — BETA-2-GLYCOPROTEIN I ABS, IGG/M/A
BETA 2 GLYCO I IGG: 4 G Units (ref <20)
BETA-2-GLYCOPROTEIN I IGA: 6 A Units (ref <20)
Beta-2-Glycoprotein I IgM: 4 M Units (ref <20)

## 2014-07-01 LAB — FACTOR 5 LEIDEN

## 2014-07-01 LAB — PROTIME-INR
INR: 1.14 (ref 0.00–1.49)
PROTHROMBIN TIME: 14.7 s (ref 11.6–15.2)

## 2014-07-01 LAB — CARDIOLIPIN ANTIBODIES, IGG, IGM, IGA
Anticardiolipin IgA: 9 APL U/mL — ABNORMAL LOW (ref <22)
Anticardiolipin IgG: 5 GPL U/mL — ABNORMAL LOW (ref <23)
Anticardiolipin IgM: 0 MPL U/mL — ABNORMAL LOW (ref <11)

## 2014-07-01 MED ORDER — ENOXAPARIN SODIUM 100 MG/ML ~~LOC~~ SOLN: 100.0000 mg | SUBCUTANEOUS | Status: DC

## 2014-07-01 MED ORDER — ENOXAPARIN SODIUM 100 MG/ML ~~LOC~~ SOLN
100.0000 mg | SUBCUTANEOUS | Status: DC
  Filled 2014-07-01: qty 1

## 2014-07-01 MED ORDER — WARFARIN SODIUM 5 MG PO TABS: 7.5000 mg | ORAL_TABLET | Freq: Every day | ORAL | Status: DC

## 2014-07-01 MED ORDER — METOPROLOL SUCCINATE ER 50 MG PO TB24: 50.0000 mg | ORAL_TABLET | Freq: Every day | ORAL | Status: DC

## 2014-07-01 MED ORDER — WARFARIN SODIUM 7.5 MG PO TABS
7.5000 mg | ORAL_TABLET | Freq: Once | ORAL | Status: DC
Start: 2014-07-01 — End: 2014-07-01
  Filled 2014-07-01: qty 1

## 2014-07-01 NOTE — Progress Notes (Addendum)
[place] for Heparin, Warfarin Indication: pulmonary embolus/DVT  No Known Allergies  Patient Measurements: Height: 5\' 6"  (167.6 cm) Weight: 156 lb 4.8 oz (70.897 kg) IBW/kg (Calculated) : 59.3 Heparin dosing weight: 71.6kg   Vital Signs: Temp: 97.7 F (36.5 C) (10/27 0437) Temp Source: Oral (10/27 0437) BP: 143/85 mmHg (10/27 0437) Pulse Rate: 87 (10/27 0437)  Labs:  Recent Labs  06/28/14 2023  06/29/14 0358 06/30/14 0413 07/01/14 0502  HGB  --   < > 12.8 13.4 13.4  HCT  --   --  38.1 40.0 41.1  PLT  --   --  152 166 173  LABPROT  --   --   --  13.6 14.7  INR  --   --   --  1.03 1.14  HEPARINUNFRC 0.30  --  0.34 0.29*  --   < > = values in this interval not displayed.  Estimated Creatinine Clearance: 37 ml/min (by C-G formula based on Cr of 1.21).  Infusions:     Assessment: 50 yoF presented with ShOB, was found to have bilateral PE.  Note history of subarachnoid hemorrhage, therefore tPA contraindicated.  Orders received to begin IV heparin with pharmacy dosing assistance.   Goal of Therapy:  INR 2-3 Monitor platelets by anticoagulation protocol: Yes  Today, 10/27:  INR = 1.14 following 2 doses warfarin (slightly trending up 1.03 >> 1.14)  No bleeding reported in latest chart notes.  Hgb and pltc WNL.  Diet: cardiac  No drug interactions noted  Plan: Day #3 (of minimum of 5-day overlap with need for INR > 2 x 2 days for VTE core measure) 1. Continue enoxaparin 70mg  SQ q12h (1mg /kg) 2. Warfarin 7.5 mg PO x 1 today at 1800. 3. PT/INR daily, CBC q72h while on LMWH in hospital 4. Follow clinical course. 5. Warfarin education completed 10/26  If discharged today, recommend:  Enoxaparin 100mg  SQ Q24hr  (5 syringes) and Warfarin 5 mg tablets- take 7.5 mg (1.5 tablets) x 2 days, with INR f/u on Thursday Morning.    Enid Skeens, PharmD Candidate 07/01/14  - Based on INR trend (rate of rise slower than desired),  will increase tonight's dose to 7.5mg  as per plan above  Doreene Eland, PharmD, BCPS.   Pager: [phone] 07/01/2014 8:19 AM   Addendum:  Damaris Schooner with Dr. Sheran Fava, patient and nursing regarding difficulty seeing number on lovenox syringe. For ease of dosing will change to Lovenox 100mg  SQ q24h (1.5mg /kg).  Doreene Eland, PharmD, BCPS.   Pager: [phone] 07/01/2014 11:43 AM

## 2014-07-01 NOTE — Clinical Documentation Improvement (Signed)
Risk Factors: BNP has come back somewhat elevated; was given dose of furosemide to treat suspected Diastolic HF per 51/89 progress notes.  Labs: 10/23: ProBNP: 3231.0    . Document acuity --Acute --Chronic --Acute on Chronic  . Due to or associated with --Cardiac or other surgery --Hypertension --Valvular disease --Rheumatic heart disease Endocarditis (valvitis) Pericarditis Myocarditis --Other (specify)   Thank You,  Theron Arista, Clinical Documentation Specialist. [email or web address]  HIM (Health Information Management) [phone]

## 2014-07-01 NOTE — Progress Notes (Signed)
PT. Gave self Lovenox injection, good technique but has trouble seeing the lines and numbers.

## 2014-07-01 NOTE — Discharge Summary (Addendum)
Physician Discharge Summary  Monique [name] WPY:[id number] DOB: 1935/12/03 DOA: 06/27/2014  PCP: Olga Millers, MD  Admit date: 06/27/2014 Discharge date: 07/01/2014  Recommendations for Outpatient Follow-up:  1. F/u for INR check at [facility] a/c clinic on Thursday 10/29 2. Lovenox 100mg  nightly until INR therapeutic for at least 24 hours.  F/u INR on Thursday at [facility] a/c clinic.   3. PCP to please follow up on pending hypercoag panel (beta-2 glycoprotein) and make sure cancer screening is up to date.   4. Follow up with Dr. Harrington Challenger within 1 month for right heart strain and ongoing monitoring for right heart failure 5. Recommend indefinite a/c unless reversible cause is discovered or if bleeding complications arise.  Discharge Diagnoses:  Principal Problem:   Pulmonary embolism, bilateral Active Problems:   Essential hypertension   Hypertrophic obstructive cardiomyopathy (HOCM)   PAROXYSMAL ATRIAL FIBRILLATION   Subarachnoid hemorrhage 2000   SOB (shortness of breath)   Acute respiratory failure with hypoxia   Pulmonary embolism   History of fibromuscular dysplasia   Cerebral aneurysm 2000   Discharge Condition: stable, improved  Diet recommendation: healthy heart  Wt Readings from Last 3 Encounters:  07/01/14 70.897 kg (156 lb 4.8 oz)  04/04/14 73.539 kg (162 lb 2 oz)  03/13/14 74.844 kg (165 lb)    History of present illness:  78 yo female h/o HOCM, PAF, SAH in 2000 with small 11mm aneurysm and several areas of fibromuscular dysplasia.  She recovered completely from this stroke and was told never to take any blood thinners in the future. She presented with sudden onset of sob.  She denied LE edema or swelling, estrogen use, recent travel or surgeries or illnessess.  No wt loss. Her last mammogram was in 2013, her last colon screening was over ten years ago.  She required nonrebreather initially and although her screening troponin was negative her pro-BNP was elevated.   Her blood pressure was low normal.    Hospital Course:   Acute respiratory failure with hypoxia due to submassive pulmonary embolism, bilateral with right heart strain and acute DVT in RLE.  Due to history of aneurysm and fibromuscular dysplasia with intracranial hemorrhage, she was NOT a candidate for tPA.  She was started on heparin gtt and required NRB for approximately 24 hours before she was able to transition to nasal canula.  On the day of discharge, she is stable on RA and able to ambulate without requiring oxygen.  Duplex lower extremities demonstrated acute DVT in the RLE, none in the left.  ECHO demonstrated moderate to severe right heart dysfunction with only mild dilation suggestive of acute right heart strain.  Findings were discussed repeatedly with critical care and interventional radiology and hematology.  They recommended against IVC filter since she tolerated anticoagulation and against tPA given her structural brain abnormalities with spontaneous ICH previously.  She was transitioned to lovenox to bridge to warfarin after > 48 hours of heparin infusion.  Warfarin was chosen instead of novel anticoagulant because of reversibility in case of bleeding complication.     Hypercoag panel: Prothrombin gene neg, cardiolipin antibody neg, factor V Leiden neg, beta-2 glycoprotein PENDING, lupus anticoagulant not detected, protein S total wnl, protein S activity greater than expected, protein C total wnl, protein C activity greater than expected, homocysteine wnl, AT III fx wnl.  Malignancy work up as outpatient.  She should continue warfarin and follow up wit   Acute DVT of the proximal profunda and proximal femoral veins  coursing through the common femoral veins. These areas are nearly occluded. No evidence of DVT on the left.  Anticoagulation as above.  TED hose to right leg for swelling.  Right heart strain: ECHO severe LVH, normal EF, severe LVH, moderate to severe RV dysfunction, mildly  dilated RV, no obvious right to left shunt.  Given HOCM and now submassive PE with right heart strain, patient at risk for development of right heart failure. She will be preload dependent so should avoid dehydration.  Recommend close f/u with cardiology for ongoing monitoring and management.   Essential hypertension, blood pressure medications were initially held due to low-normal BPs off of medications.  Her BP gradually rose and she was able to resume her BB.    CKD stage 3 with CrCl in the 30s.  Discussed case with pharmacy.  We will dose lovenox once daily at 100mg  (slightly below 105 mg which would be 1.5mg /kg) due to possibility of mildly decreased clearance and for ease of administration.    Hypertrophic obstructive cardiomyopathy (HOCM)   PAROXYSMAL ATRIAL FIBRILLATION.  Remained in NSR.    Subarachnoid hemorrhage 2000  History of fibromuscular dysplasia per intracranial/vertebral angiogram done in 2000  Cerebral aneurysm 2000  Consultants:  Hematology  PCCM by phone Procedures:  CXR  CT angio chest: Bilateral PE with evidence of right heart strain Antibiotics:  none   Discharge Exam: Filed Vitals:   07/01/14 1024  BP: 128/84  Pulse: 101  Temp: 97.7 F (36.5 C)  Resp:    Filed Vitals:   06/30/14 1643 06/30/14 2132 07/01/14 0437 07/01/14 1024  BP:  138/76 143/85 128/84  Pulse:  91 87 101  Temp:  98 F (36.7 C) 97.7 F (36.5 C) 97.7 F (36.5 C)  TempSrc:  Oral Oral Oral  Resp:  22 20   Height:      Weight:   70.897 kg (156 lb 4.8 oz)   SpO2: 93% 92% 92% 97%    General: Thin BF, NAD, breathing comfortably on RA HEENT: NCAT, MMM  Cardiovascular: RRR, nl S1, S2, 2/6 systolic murmur RUSB, 2+ pulses, warm extremities  Respiratory: CTAB, no increased WOB  Abdomen: NABS, soft, NT/ND  MSK: Normal tone and bulk, trace edema of the RLE, TED hose in place Neuro: Grossly intact   Discharge Instructions      Discharge Instructions   Call MD for:  difficulty  breathing, headache or visual disturbances    Complete by:  As directed      Call MD for:  extreme fatigue    Complete by:  As directed      Call MD for:  hives    Complete by:  As directed      Call MD for:  persistant dizziness or light-headedness    Complete by:  As directed      Call MD for:  persistant nausea and vomiting    Complete by:  As directed      Call MD for:  severe uncontrolled pain    Complete by:  As directed      Call MD for:  temperature >100.4    Complete by:  As directed      Diet - low sodium heart healthy    Complete by:  As directed      Discharge instructions    Complete by:  As directed   You were hospitalized with blood clots in your right leg and in your lungs.  You have deep vein thrombosis (DVT) and  pulmonary emboli (PE).  Please take lovenox injections to thin your blood while you wait for the warfarin to start to work.  Your next lovenox injection is due this evening.  Please take warfarin nightly, take one and a half 5mg  tabs (total of 7.5mg ) this evening and Wednesday evening and have your INR checked on Thursday at the already scheduled appointment.  You will need to have a follow up appointment with Dr. Harrington Challenger to evaluate your heart function in about a month.  Please STOP your losartan and nifedipine.  Please take your metoprolol and your disopyramide.  Please avoid dehydration.  If you notice increasing shortness of breath, seek immediate medical attention.  If you notice swelling of your legs, please call Dr. Alan Ripper office right away.     Increase activity slowly    Complete by:  As directed             Medication List    STOP taking these medications       losartan 100 MG tablet  Commonly known as:  COZAAR     NIFEdipine 60 MG 24 hr tablet  Commonly known as:  PROCARDIA XL/ADALAT-CC      TAKE these medications       acetaminophen 500 MG tablet  Commonly known as:  TYLENOL  Take 1,000 mg by mouth every 6 (six) hours as needed for mild pain.      disopyramide 150 MG capsule  Commonly known as:  NORPACE  Take 1 capsule (150 mg total) by mouth 2 (two) times daily.     enoxaparin 100 MG/ML injection  Commonly known as:  LOVENOX  Inject 1 mL (100 mg total) into the skin daily.     metoprolol succinate 100 MG 24 hr tablet  Commonly known as:  TOPROL-XL  Take 100 mg by mouth 2 (two) times daily. Take with or immediately following a meal.     simvastatin 40 MG tablet  Commonly known as:  ZOCOR  Take 40 mg by mouth daily.     warfarin 5 MG tablet  Commonly known as:  COUMADIN  Take 1.5 tablets (7.5 mg total) by mouth daily at 6 PM.       Follow-up Information   Follow up with Dorris Carnes, MD On 07/03/2014. (appointment at 2:30 PM for INR check)    Specialty:  Cardiology   Contact information:   [place] [address] [phone]       Follow up with Dorris Carnes, MD. Schedule an appointment as soon as possible for a visit in 1 month. (f/u HOCM and right heart strain)    Specialty:  Cardiology   Contact information:   [place] [address] [place] [address] [phone]       Follow up with Olga Millers, MD. Schedule an appointment as soon as possible for a visit in 1 week.   Specialty:  Internal Medicine   Contact information:   [place] [id number] [phone]        The results of significant diagnostics from this hospitalization (including imaging, microbiology, ancillary and laboratory) are listed below for reference.    Significant Diagnostic Studies: Dg Chest 2 View  06/27/2014   CLINICAL DATA:  Shortness of breath for 1 week, worsening today. Upper chest pain and low oxygen saturations.  EXAM: CHEST  2 VIEW  COMPARISON:  05/20/2008  FINDINGS: Normal heart size and pulmonary vascularity. No focal airspace disease  or consolidation in the lungs. No blunting of costophrenic angles. No pneumothorax. Mediastinal contours appear intact.  Tortuous aorta.  IMPRESSION: No active cardiopulmonary disease.   Electronically Signed   By: Lucienne Capers M.D.   On: 06/27/2014 01:49   Ct Angio Chest Pe W/cm &/or Wo Cm  06/27/2014   CLINICAL DATA:  Shortness of breath increasing with exertion. Central chest pain. Elevated D-dimer.  EXAM: CT ANGIOGRAPHY CHEST WITH CONTRAST  TECHNIQUE: Multidetector CT imaging of the chest was performed using the standard protocol during bolus administration of intravenous contrast. Multiplanar CT image reconstructions and MIPs were obtained to evaluate the vascular anatomy.  CONTRAST:  176mL OMNIPAQUE IOHEXOL 350 MG/ML SOLN  COMPARISON:  CT abdomen and pelvis 01/09/2013  FINDINGS: Technically adequate study with good opacification of the central and segmental pulmonary arteries. Multiple filling defects are demonstrated in the lobar and proximal segmental pulmonary arteries consistent with multiple pulmonary emboli. Increased RV to LV ratio suggest possibility of right heart strain.  Normal caliber thoracic aorta. No significant lymphadenopathy in the chest. Esophagus is decompressed. Lungs are clear. Airways are patent. No pleural effusion. No pneumothorax. Included portions of the upper abdominal organs are grossly unremarkable. Degenerative changes in the spine.  Review of the MIP images confirms the above findings.  IMPRESSION: Positive study for multiple bilateral central and segmental pulmonary emboli. Increased RV LV ratio suggest possible right heart strain.  These results were called by telephone at the time of interpretation on 06/27/2014 at 4:12 am to Dr. Delora Fuel , who verbally acknowledged these results.   Electronically Signed   By: Lucienne Capers M.D.   On: 06/27/2014 04:17    Microbiology: Recent Results (from the past 240 hour(s))  MRSA PCR SCREENING     Status: None   Collection Time    06/27/14  6:45 AM      Result Value Ref Range Status   MRSA by PCR NEGATIVE  NEGATIVE Final   Comment:             The GeneXpert MRSA Assay (FDA     approved for NASAL specimens     only), is one component of a     comprehensive MRSA colonization     surveillance program. It is not     intended to diagnose MRSA     infection nor to guide or     monitor treatment for     MRSA infections.     Performed at [facility]: Basic Metabolic Panel:  Recent Labs Lab 06/27/14 0141 06/28/14 0402  NA 144 141  K 4.1 3.8  CL 106 106  CO2 23 25  GLUCOSE 214* 120*  BUN 20 18  CREATININE 1.30* 1.21*  CALCIUM 9.8 9.0   Liver Function Tests:  Recent Labs Lab 06/27/14 0141  AST 40*  ALT 21  ALKPHOS 100  BILITOT 0.7  PROT 7.7  ALBUMIN 3.8   No results found for this basename: LIPASE, AMYLASE,  in the last 168 hours No results found for this basename: AMMONIA,  in the last 168 hours CBC:  Recent Labs Lab 06/27/14 0141 06/28/14 0402 06/29/14 0358 06/30/14 0413 07/01/14 0502  WBC 8.6 10.2 9.9 7.9 6.7  NEUTROABS 6.4  --   --   --   --   HGB 14.1 12.6 12.8 13.4 13.4  HCT 41.7 38.0 38.1 40.0 41.1  MCV 93.9 94.1 93.4 92.8 95.1  PLT 151 151 152 166 173  Cardiac Enzymes:  Recent Labs Lab 06/27/14 0141  TROPONINI <0.30   BNP: BNP (last 3 results)  Recent Labs  06/27/14 0141  PROBNP 3231.0*   CBG: No results found for this basename: GLUCAP,  in the last 168 hours  Time coordinating discharge: 35 minutes  Signed:  Jackline Castilla  [facility] 07/01/2014, 12:23 PM

## 2014-07-01 NOTE — Progress Notes (Signed)
CARE MANAGEMENT NOTE 07/01/2014  Patient:  FRANZISKA, PODGURSKI   Account Number:  [id number]  Date Initiated:  06/27/2014  Documentation initiated by:  DAVIS,RHONDA  Subjective/Objective Assessment:   pulmonary embolism/bilaterally per ct scan/rt heart strain is present.     Action/Plan:   home when stable/lives alone but does have a family support system through her adult children   Anticipated DC Date:  06/30/2014   Anticipated DC Plan:  HOME/SELF CARE  In-house referral  NA      DC Planning Services  CM consult  Follow-up appt scheduled      PAC Choice  NA   Choice offered to / List presented to:  NA   DME arranged  NA      DME agency  NA     [place] arranged  NA      [place] agency  NA   Status of service:  In process, will continue to follow Medicare Important Message given?   (If response is "NO", the following Medicare IM given date fields will be blank) Date Medicare IM given:   Medicare IM given by:   Date Additional Medicare IM given:   Additional Medicare IM given by:    Discharge Disposition:  [place]  Per UR Regulation:  Reviewed for med. necessity/level of care/duration of stay  If discussed at [place] of Stay Meetings, dates discussed:    Comments:  07/01/14 MMcGibboney, RN, BSN Appointment at Dr. Harrington Challenger office with coumadin clinic for INR on Thurs 10/29 at 2:30 PM.  Pt selected time and is aware of appointment.   [id number] DAvis,RN,BSN,CCM

## 2014-07-03 ENCOUNTER — Telehealth: Payer: Self-pay | Admitting: *Deleted

## 2014-07-03 ENCOUNTER — Ambulatory Visit (INDEPENDENT_AMBULATORY_CARE_PROVIDER_SITE_OTHER): Payer: 59 | Admitting: Pharmacist Clinician (PhC)/ Clinical Pharmacy Specialist

## 2014-07-03 DIAGNOSIS — I2699 Other pulmonary embolism without acute cor pulmonale: Secondary | ICD-10-CM

## 2014-07-03 DIAGNOSIS — Z7901 Long term (current) use of anticoagulants: Secondary | ICD-10-CM

## 2014-07-03 LAB — POCT INR: INR: 1.7

## 2014-07-03 NOTE — Telephone Encounter (Signed)
Called pt concerning TCM LMOM (H) and son # to give call bck...Monique [name]

## 2014-07-03 NOTE — Telephone Encounter (Signed)
Pt return call back completed Transition Care Management Follow-up Telephone Call D/C 07/01/14  How have you been since you were released from the hospital? Pt states she is making it   Do you understand why you were in the hospital? She stated yes she understood why she was admitted   Do you understand the discharge instrcutions? Pt states yes, but we reviewed d/c summary  Items Reviewed:  Medications reviewed: YES, reviewed  Allergies reviewed: YES, reviewed  Dietary changes reviewed:heart healthy  Referrals reviewed: she state no referral needed she had to f/u with her cancer md today, also see Dr. Harrington Challenger next week   Functional Questionnaire:   Activities of Daily Living (ADLs):   She states she independent in the following: ambulation, bathing and hygiene, feeding and dressing She states she does notneed assistance   Any transportation issues/concerns?: NO   Any patient concerns? NO   Confirmed importance and date/time of follow-up visits scheduled: YES made appt to see Dr. Doug Sou 07/07/14   Confirmed with patient if condition begins to worsen call PCP or go to the ER.  Patient was given the Call-a-Nurse line [phone]: YES

## 2014-07-07 ENCOUNTER — Inpatient Hospital Stay: Payer: 59 | Admitting: Internal Medicine

## 2014-07-07 ENCOUNTER — Encounter: Payer: Self-pay | Admitting: Family

## 2014-07-07 ENCOUNTER — Ambulatory Visit (INDEPENDENT_AMBULATORY_CARE_PROVIDER_SITE_OTHER): Payer: 59 | Admitting: Family

## 2014-07-07 ENCOUNTER — Ambulatory Visit (INDEPENDENT_AMBULATORY_CARE_PROVIDER_SITE_OTHER): Payer: 59 | Admitting: *Deleted

## 2014-07-07 ENCOUNTER — Telehealth: Payer: Self-pay | Admitting: *Deleted

## 2014-07-07 VITALS — BP 142/98 | HR 61 | Temp 97.9°F | Resp 18 | Ht 66.0 in | Wt 160.0 lb

## 2014-07-07 DIAGNOSIS — Z7901 Long term (current) use of anticoagulants: Secondary | ICD-10-CM

## 2014-07-07 DIAGNOSIS — R300 Dysuria: Secondary | ICD-10-CM

## 2014-07-07 DIAGNOSIS — I2699 Other pulmonary embolism without acute cor pulmonale: Secondary | ICD-10-CM

## 2014-07-07 HISTORY — DX: Dysuria: R30.0

## 2014-07-07 LAB — POCT INR: INR: 2.3

## 2014-07-07 MED ORDER — CEFUROXIME AXETIL 250 MG PO TABS: 250.0000 mg | ORAL_TABLET | Freq: Two times a day (BID) | ORAL | Status: DC

## 2014-07-07 NOTE — Assessment & Plan Note (Signed)
Appears currently stable with chest pain or shortness of breath. Continue current level of coumadin as determined by the coumadin clinic. Discussed PE's and rationale behind anticoagulation and signs and symptoms associated with nuisance bleeding. INR is in appropriate range. Follow up with Dr. Doug Sou per previously scheduled appointment or sooner if needed.

## 2014-07-07 NOTE — Telephone Encounter (Signed)
-----   Message from Tommy Medal, [place] sent at 07/03/2014  3:07 PM EDT ----- Ms. Blackham needs to get a note from Dr. Harrington Challenger for her short term disability.  Could you please call her and see exactly what she needs?  She will be back for INR check on Monday and would like to be able to pick up something at that time.  Thanks [employer]

## 2014-07-07 NOTE — Patient Instructions (Signed)
Thank you for choosing [facility].  Summary/Instructions:   Please take your antibiotic until completed  If your symptoms worsen or fail to improve please let us know

## 2014-07-07 NOTE — Progress Notes (Signed)
Pre visit review using our clinic review tool, if applicable. No additional management support is needed unless otherwise documented below in the visit note. 

## 2014-07-07 NOTE — Telephone Encounter (Deleted)
Tommy Medal, RPH-CPP  Rodman Key, RN           Ms. Potteiger needs to get a note from Dr. Harrington Challenger for her short term disability. Could you please call her and see exactly what she needs? She will be back for INR check on Monday and would like to be able to pick up something at that time.   Thanks  [employer]

## 2014-07-07 NOTE — Telephone Encounter (Signed)
-----   Message from Philbert Riser sent at 07/03/2014  3:15 PM EDT ----- Regarding: POST HOSP 07/03/14 Monique [name], This patient is post hospital (07/01/14)came  to checkout today needing an appointment in one month with Dr.Ross.   Thanks

## 2014-07-07 NOTE — Telephone Encounter (Signed)
Called patient. Left message to call back to inquire about short term disability forms and schedule an appointment with Dr. Harrington Challenger.

## 2014-07-07 NOTE — Progress Notes (Signed)
   Subjective:    Patient ID: Monique [name], female    DOB: 28-Feb-1936, 78 y.o.   MRN: [id number]  Chief Complaint  Patient presents with  . Hospitalization Follow-up    Doing better, had blood clot in lung and leg    HPI:  Monique [name] is a 78 y.o. female who presents today for a hospital follow up and for dysuria.   Was recently admitted to [facility] because of a sudden worsening of shortness of breath. CT of the chest was positive for multiple bilateral central and segmental pulmonary emboli. She was placed on a heparin drip. Duplex lower extremity doppler revealed a DVT in the right lower extremity. An ECHO performed showed severe right heart dysfunction and signs of acute right heart strain. She was transitioned from lovenox on the way to anticoagulation with warfarin. She was seen in the coumadin clinic today and her current INR is 2.3.   Since she left the hospital she has been doing good and breathing good. Denies chest pain discomfort, or shortness of breath. Denies any nuisance bleeding. Denies any issue with completing her ADLs or IADLs. Requesting a medical note for work.  2) Dysuria - Acute onset of symptoms started about a day ago. She is currently experiencing dysuria with increased frequency and urgency. Denies fever or back pain. Has not attempted any treatments and nothing makes it better or worse.   No Known Allergies  Current Outpatient Prescriptions on File Prior to Visit  Medication Sig Dispense Refill  . acetaminophen (TYLENOL) 500 MG tablet Take 1,000 mg by mouth every 6 (six) hours as needed for mild pain.    Marland Kitchen disopyramide (NORPACE) 150 MG capsule Take 1 capsule (150 mg total) by mouth 2 (two) times daily. 180 capsule 3  . enoxaparin (LOVENOX) 100 MG/ML injection Inject 1 mL (100 mg total) into the skin daily. 5 Syringe 0  . metoprolol succinate (TOPROL-XL) 100 MG 24 hr tablet Take 100 mg by mouth 2 (two) times daily. Take with or immediately  following a meal.    . simvastatin (ZOCOR) 40 MG tablet Take 40 mg by mouth daily.    Marland Kitchen warfarin (COUMADIN) 5 MG tablet Take 1.5 tablets (7.5 mg total) by mouth daily at 6 PM. 90 tablet 0   No current facility-administered medications on file prior to visit.   Past Medical History  Diagnosis Date  . PAF (paroxysmal atrial fibrillation)   . LVH (left ventricular hypertrophy)   . Subarachnoid hemorrhage   . Hyperlipidemia   . HTN (hypertension)     Essential    Review of Systems    See HPI  Objective:    BP 142/98 mmHg  Pulse 61  Temp(Src) 97.9 F (36.6 C) (Oral)  Resp 18  Ht 5\' 6"  (1.676 m)  Wt 160 lb (72.576 kg)  BMI 25.84 kg/m2  SpO2 96% Nursing note and vital signs reviewed.  Physical Exam  Constitutional: She is oriented to person, place, and time. She appears well-developed and well-nourished. No distress.  Cardiovascular: Normal rate, regular rhythm, normal heart sounds and intact distal pulses.   Pulmonary/Chest: Effort normal and breath sounds normal.  Neurological: She is alert and oriented to person, place, and time.  Skin: Skin is warm and dry.  Psychiatric: She has a normal mood and affect. Her behavior is normal. Judgment and thought content normal.       Assessment & Plan:

## 2014-07-07 NOTE — Assessment & Plan Note (Signed)
Symptoms consistent with UTI. POCT UA reveals leukocytes in urine. Start Ceftin. Follow up if symptoms worsen or fail to improve.

## 2014-07-08 LAB — POCT URINALYSIS DIPSTICK
Bilirubin, UA: NEGATIVE
Blood, UA: NEGATIVE
GLUCOSE UA: NEGATIVE
Ketones, UA: NEGATIVE
Nitrite, UA: NEGATIVE
PROTEIN UA: 0.15
UROBILINOGEN UA: NEGATIVE
pH, UA: 5.5

## 2014-07-08 NOTE — Telephone Encounter (Signed)
Contacted the pt, and pts states she is returning a call back to [facility] in regards to pts short term disability and setting up an appt to see Dr Harrington Challenger in one month.  Offered pt assistance in helping with these request, and pt kindly states she would prefer to speak with Michalene instead, being she is more familiar with her case.  Informed the pt that Caren Hazy is out of the office today, and will not be back until Thursday.  Pt verbalized understanding and ok with waiting until Thursday to speak with her.  Informed the pt I will route this message to [facility] to call and follow-up with the pt.

## 2014-07-08 NOTE — Addendum Note (Signed)
Addended by: Delice Bison E on: 07/08/2014 08:11 AM   Modules accepted: Orders

## 2014-07-08 NOTE — Telephone Encounter (Signed)
Follow up    Returning call back to nurse from yesterday  

## 2014-07-10 NOTE — Telephone Encounter (Signed)
Patient has given her employer the information for her short term disability. She plans to retire in December. F/u appointment with Dr. Harrington Challenger scheduled 11/20.

## 2014-07-14 ENCOUNTER — Ambulatory Visit (INDEPENDENT_AMBULATORY_CARE_PROVIDER_SITE_OTHER): Payer: 59 | Admitting: Pharmacist

## 2014-07-14 DIAGNOSIS — I2699 Other pulmonary embolism without acute cor pulmonale: Secondary | ICD-10-CM

## 2014-07-14 DIAGNOSIS — Z7901 Long term (current) use of anticoagulants: Secondary | ICD-10-CM

## 2014-07-14 LAB — POCT INR: INR: 4.6

## 2014-07-21 ENCOUNTER — Ambulatory Visit (INDEPENDENT_AMBULATORY_CARE_PROVIDER_SITE_OTHER): Payer: 59 | Admitting: *Deleted

## 2014-07-21 DIAGNOSIS — Z7901 Long term (current) use of anticoagulants: Secondary | ICD-10-CM

## 2014-07-21 DIAGNOSIS — I2699 Other pulmonary embolism without acute cor pulmonale: Secondary | ICD-10-CM

## 2014-07-21 LAB — POCT INR: INR: 3.3

## 2014-07-22 ENCOUNTER — Ambulatory Visit (INDEPENDENT_AMBULATORY_CARE_PROVIDER_SITE_OTHER): Payer: 59 | Admitting: Internal Medicine

## 2014-07-22 ENCOUNTER — Encounter: Payer: Self-pay | Admitting: Internal Medicine

## 2014-07-22 VITALS — BP 140/90 | HR 73 | Temp 98.0°F | Resp 16 | Ht 66.0 in | Wt 157.4 lb

## 2014-07-22 DIAGNOSIS — J810 Acute pulmonary edema: Secondary | ICD-10-CM

## 2014-07-22 DIAGNOSIS — I2699 Other pulmonary embolism without acute cor pulmonale: Secondary | ICD-10-CM

## 2014-07-22 DIAGNOSIS — I10 Essential (primary) hypertension: Secondary | ICD-10-CM

## 2014-07-22 DIAGNOSIS — I671 Cerebral aneurysm, nonruptured: Secondary | ICD-10-CM

## 2014-07-22 DIAGNOSIS — E785 Hyperlipidemia, unspecified: Secondary | ICD-10-CM

## 2014-07-22 DIAGNOSIS — I480 Paroxysmal atrial fibrillation: Secondary | ICD-10-CM

## 2014-07-22 NOTE — Patient Instructions (Signed)
We will send another referral for the colonoscopy. You should hear back from their office within the next week. We have given you the information below about the breast center. Please call to schedule a mammogram.  We will not add back an additional blood pressure medicine today. We will see back in 2-3 months to check in on your blood pressure.  [facility] [id number], [place], [place] [address] [phone]  If you have any bleeding, headache please seek emergent medical attention.

## 2014-07-22 NOTE — Progress Notes (Signed)
Pre visit review using our clinic review tool, if applicable. No additional management support is needed unless otherwise documented below in the visit note. 

## 2014-07-23 NOTE — Assessment & Plan Note (Addendum)
Would recommend that she continue on Coumadin due to its reversibility given her past history of aneurysm with bleed. Given that she has no known cause of her PE would recommend mammogram as well as screening colonoscopy. She has had other appropriate cancer screening. Given information for the breast center to get mammogram. Referral placed for GI for colonoscopy.

## 2014-07-23 NOTE — Progress Notes (Signed)
   Subjective:    Patient ID: Monique [name], female    DOB: Jul 26, 1936, 78 y.o.   MRN: [id number]  HPI The patient is a 78 year old female who comes in today as a new patient. She was recently in the hospital with new PE, DVT. She also has past medical history of hypertension, hyperlipidemia, paroxysmal A. Fib. She has history of remote cerebral aneurysm with small bleed in 2000, status post repair. There was no known cause of her DVT. She denies any recent travel, trips, and mobility. She had was not taking any kind of hormone therapy. She is a nonsmoker. Several of her blood pressure medications were stopped in the hospital due to low blood pressure. She wonders if she may need to go back on some of those medications. She denies any chest pains, her breathing is almost back to normal, no abdominal pains, no constipation or diarrhea. She denies any headaches, bleeding. She denies any blood in stool. She never really had any swelling in her leg.  Review of Systems  Constitutional: Negative for fever, chills, activity change, appetite change, fatigue and unexpected weight change.  HENT: Negative.   Eyes: Negative.   Respiratory: Positive for shortness of breath. Negative for cough, chest tightness and wheezing.        Minimal, improving.  Cardiovascular: Negative for chest pain, palpitations and leg swelling.  Gastrointestinal: Negative for nausea, abdominal pain, diarrhea, constipation, blood in stool and abdominal distention.  Genitourinary: Negative.   Musculoskeletal: Negative.   Skin: Negative.   Neurological: Negative for dizziness, weakness, light-headedness and headaches.      Objective:   Physical Exam  Constitutional: She is oriented to person, place, and time. She appears well-developed and well-nourished.  HENT:  Head: Normocephalic and atraumatic.  Eyes: EOM are normal.  Neck: Normal range of motion.  Cardiovascular: Normal rate, regular rhythm and intact distal pulses.     Pulmonary/Chest: Effort normal and breath sounds normal. No respiratory distress. She has no wheezes. She has no rales.  Abdominal: Soft. Bowel sounds are normal. She exhibits no distension. There is no tenderness. There is no rebound.  Musculoskeletal: She exhibits no edema or tenderness.  Neurological: She is alert and oriented to person, place, and time. Coordination normal.  Skin: Skin is warm and dry.   Filed Vitals:   07/22/14 1025 07/22/14 1100  BP: 162/90 140/90  Pulse: 73   Temp: 98 F (36.7 C)   TempSrc: Oral   Resp: 16   Height: 5\' 6"  (1.676 m)   Weight: 157 lb 6.4 oz (71.396 kg)   SpO2: 95%       Assessment & Plan:

## 2014-07-23 NOTE — Assessment & Plan Note (Signed)
She continues on metoprolol, rate controlled.

## 2014-07-23 NOTE — Assessment & Plan Note (Signed)
The patient denies that they told her it was her aneurysm that bled in 2000 however this would be what it would appear from the records. Given this history would not recommend one of the novel oral anticoagulants.

## 2014-07-23 NOTE — Assessment & Plan Note (Signed)
Reviewed recent lipid panel, continue Zocor.

## 2014-07-23 NOTE — Assessment & Plan Note (Signed)
Initial BP mildly elevated, recheck normal. We'll continue to monitor closely her blood pressure. Given that several medications were stopped in hospital we may have to go back on one or more of these medications over the next 6-12 months.

## 2014-07-25 ENCOUNTER — Encounter: Payer: Self-pay | Admitting: Internal Medicine

## 2014-07-25 ENCOUNTER — Ambulatory Visit (INDEPENDENT_AMBULATORY_CARE_PROVIDER_SITE_OTHER): Payer: 59 | Admitting: Internal Medicine

## 2014-07-25 VITALS — BP 152/94 | HR 62 | Ht 66.0 in | Wt 156.1 lb

## 2014-07-25 DIAGNOSIS — I10 Essential (primary) hypertension: Secondary | ICD-10-CM

## 2014-07-25 LAB — CBC
HCT: 45 % (ref 36.0–46.0)
Hemoglobin: 14.6 g/dL (ref 12.0–15.0)
MCHC: 32.3 g/dL (ref 30.0–36.0)
MCV: 94.8 fl (ref 78.0–100.0)
Platelets: 172 10*3/uL (ref 150.0–400.0)
RBC: 4.75 Mil/uL (ref 3.87–5.11)
RDW: 13.3 % (ref 11.5–15.5)
WBC: 9.6 10*3/uL (ref 4.0–10.5)

## 2014-07-25 LAB — BASIC METABOLIC PANEL
BUN: 18 mg/dL (ref 6–23)
CHLORIDE: 104 meq/L (ref 96–112)
CO2: 29 mEq/L (ref 19–32)
Calcium: 9.8 mg/dL (ref 8.4–10.5)
Creatinine, Ser: 1.5 mg/dL — ABNORMAL HIGH (ref 0.4–1.2)
GFR: 43.38 mL/min — AB (ref >60.00)
GLUCOSE: 94 mg/dL (ref 70–99)
Potassium: 3.8 mEq/L (ref 3.5–5.1)
SODIUM: 140 meq/L (ref 135–145)

## 2014-07-25 MED ORDER — METOPROLOL SUCCINATE ER 50 MG PO TB24: 50.0000 mg | ORAL_TABLET | Freq: Every day | ORAL | Status: DC

## 2014-07-25 MED ORDER — LOSARTAN POTASSIUM 25 MG PO TABS: 25.0000 mg | ORAL_TABLET | Freq: Every day | ORAL | Status: DC

## 2014-07-25 NOTE — Progress Notes (Signed)
HPI Monique [name] is a 78 y.o. female with  a history of hypertrophic cardiomyopathy, hypertension, hyperlipidemia, paroxysmal atrial fibrillation and prior subarachnoid hemorrhage.  Nuclear study 12/02: No scar or ischemia, EF 71%. LHC 9/09: EF 60%, no CAD, no LVOT gradient. Echocardiogram 10/05: Moderate LVH, moderate intracavitary gradient, grade 3 diastolic dysfunction, no MR.   Since the patinet was last here she was admitted with SOB  Found to have PE  Echo showed evid of RV strain.  DVT found in RLE  Patient denied travel prior to event Since she left the hospital she says her breathing is a little better.  Denies dizziness No Known Allergies  Current Outpatient Prescriptions  Medication Sig Dispense Refill  . acetaminophen (TYLENOL) 500 MG tablet Take 1,000 mg by mouth every 6 (six) hours as needed for mild pain.    Marland Kitchen disopyramide (NORPACE) 150 MG capsule Take 1 capsule (150 mg total) by mouth 2 (two) times daily. 180 capsule 3  . metoprolol succinate (TOPROL-XL) 100 MG 24 hr tablet Take 50 mg by mouth 2 (two) times daily.   0  . simvastatin (ZOCOR) 40 MG tablet Take 40 mg by mouth daily.    Marland Kitchen warfarin (COUMADIN) 5 MG tablet Take 1.5 tablets (7.5 mg total) by mouth daily at 6 PM. 90 tablet 0   No current facility-administered medications for this visit.    Past Medical History  Diagnosis Date  . PAF (paroxysmal atrial fibrillation)   . LVH (left ventricular hypertrophy)   . Subarachnoid hemorrhage   . Hyperlipidemia   . HTN (hypertension)     Essential    Past Surgical History  Procedure Laterality Date  . Hysterectomy      History reviewed. No pertinent family history.  History   Social History  . Marital Status: Single    Spouse Name: N/A    Number of Children: N/A  . Years of Education: N/A   Occupational History  . Not on file.   Social History Main Topics  . Smoking status: Former Research scientist (life sciences)  . Smokeless tobacco: Never Used  . Alcohol Use: No  . Drug Use:  No  . Sexual Activity: Not Currently   Other Topics Concern  . Not on file   Social History Narrative      HSG. Married '61 - '88/divorced. 3 sons - '74, '59, '62, 1 daughter '63; 5 grandchildren. Lives alone.     Review of Systems:  All systems reviewed.  They are negative to the above problem except as previously stated.  Vital Signs: BP 152/94 mmHg  Pulse 62  Ht 5\' 6"  (1.676 m)  Wt 156 lb 1.9 oz (70.816 kg)  BMI 25.21 kg/m2  SpO2 96%  Physical Exam Patient is in AND HEENT:  Normocephalic, atraumatic. EOMI, PERRLA.  Neck: JVP is normal.  No bruits.  Lungs: clear to auscultation. No rales no wheezes.  Heart: Regular rate and rhythm. Normal S1, S2. No S3.   II/VI Systolic murmur base PMI not displaced.  Abdomen:  Supple, nontender. Normal bowel sounds. No masses. No hepatomegaly.  Extremities:   Good distal pulses throughout. 1+ lower extremity edema.  Musculoskeletal :moving all extremities.  Neuro:   alert and oriented x3.  CN II-XII grossly intact.   Assessment and Plan: 1.  Pulmonary embolus Patinet is now on coumadin  Due for mammogram  Will undergo further evaluation. Hemodynamics are good  RV function was down at admit  Will f/u with echo some time next year  1.  Hypertrophic CM  Asymptomatic Continue on current regimen  3.  HTN  BP is a little high  WOuld add back Cozaar at 25 mg dose    4.  Atrial fib  Patinet has not had further spells.    3  n  2.  Edema  Labs, echo   3.  HTN  Adequate control  4.  PAF  No symptoms to suggest recurrence.  Not an anticoag candidate

## 2014-07-25 NOTE — Patient Instructions (Signed)
Your physician has recommended you make the following change in your medication:  1.) START COZAAR 25 [place] physician recommends that you return for lab work in: TODAY (CBC, BMET) Your physician wants you to follow-up in: [place].  You will receive a reminder letter in the mail two months in advance. If you don't receive a letter, please call our office to schedule the follow-up appointment.

## 2014-07-29 ENCOUNTER — Telehealth: Payer: Self-pay | Admitting: *Deleted

## 2014-07-29 ENCOUNTER — Other Ambulatory Visit (INDEPENDENT_AMBULATORY_CARE_PROVIDER_SITE_OTHER): Payer: 59 | Admitting: *Deleted

## 2014-07-29 ENCOUNTER — Ambulatory Visit (INDEPENDENT_AMBULATORY_CARE_PROVIDER_SITE_OTHER): Payer: 59 | Admitting: *Deleted

## 2014-07-29 DIAGNOSIS — I10 Essential (primary) hypertension: Secondary | ICD-10-CM

## 2014-07-29 DIAGNOSIS — I480 Paroxysmal atrial fibrillation: Secondary | ICD-10-CM

## 2014-07-29 DIAGNOSIS — I421 Obstructive hypertrophic cardiomyopathy: Secondary | ICD-10-CM

## 2014-07-29 DIAGNOSIS — Z7901 Long term (current) use of anticoagulants: Secondary | ICD-10-CM

## 2014-07-29 DIAGNOSIS — I2699 Other pulmonary embolism without acute cor pulmonale: Secondary | ICD-10-CM

## 2014-07-29 DIAGNOSIS — E785 Hyperlipidemia, unspecified: Secondary | ICD-10-CM

## 2014-07-29 LAB — POCT INR: INR: 3.5

## 2014-07-29 NOTE — Telephone Encounter (Signed)
-----   Message from Fay Records, MD sent at 07/28/2014 11:08 PM EST ----- Keep on same regimen   CBC normal F/U BMET when comes back for INR.

## 2014-07-29 NOTE — Telephone Encounter (Signed)
Patient coming today for INR, will have f/u bmet at this time.

## 2014-07-30 DIAGNOSIS — I2699 Other pulmonary embolism without acute cor pulmonale: Secondary | ICD-10-CM

## 2014-07-30 LAB — BASIC METABOLIC PANEL
BUN: 20 mg/dL (ref 6–23)
CO2: 28 meq/L (ref 19–32)
CREATININE: 1.5 mg/dL — AB (ref 0.4–1.2)
Calcium: 9.5 mg/dL (ref 8.4–10.5)
Chloride: 105 mEq/L (ref 96–112)
GFR: 44.75 mL/min — ABNORMAL LOW (ref >60.00)
GLUCOSE: 82 mg/dL (ref 70–99)
Potassium: 4 mEq/L (ref 3.5–5.1)
Sodium: 139 mEq/L (ref 135–145)

## 2014-08-11 ENCOUNTER — Ambulatory Visit (INDEPENDENT_AMBULATORY_CARE_PROVIDER_SITE_OTHER): Payer: 59 | Admitting: Internal Medicine

## 2014-08-11 ENCOUNTER — Encounter: Payer: Self-pay | Admitting: Internal Medicine

## 2014-08-11 ENCOUNTER — Other Ambulatory Visit (INDEPENDENT_AMBULATORY_CARE_PROVIDER_SITE_OTHER): Payer: 59

## 2014-08-11 VITALS — BP 130/90 | HR 64 | Temp 97.8°F | Ht 66.0 in | Wt 158.0 lb

## 2014-08-11 DIAGNOSIS — I10 Essential (primary) hypertension: Secondary | ICD-10-CM

## 2014-08-11 DIAGNOSIS — R10819 Abdominal tenderness, unspecified site: Secondary | ICD-10-CM

## 2014-08-11 LAB — POCT URINALYSIS DIPSTICK
Bilirubin, UA: NEGATIVE
Glucose, UA: NEGATIVE
KETONES UA: NEGATIVE
Nitrite, UA: NEGATIVE
PROTEIN UA: NEGATIVE
SPEC GRAV UA: 1.025
Urobilinogen, UA: 0.2
pH, UA: 5

## 2014-08-11 LAB — BASIC METABOLIC PANEL
BUN: 22 mg/dL (ref 6–23)
CALCIUM: 9 mg/dL (ref 8.4–10.5)
CO2: 28 meq/L (ref 19–32)
Chloride: 106 mEq/L (ref 96–112)
Creatinine, Ser: 1.4 mg/dL — ABNORMAL HIGH (ref 0.4–1.2)
GFR: 48.16 mL/min — ABNORMAL LOW (ref >60.00)
GLUCOSE: 108 mg/dL — AB (ref 70–99)
Potassium: 3.9 mEq/L (ref 3.5–5.1)
Sodium: 139 mEq/L (ref 135–145)

## 2014-08-11 NOTE — Patient Instructions (Signed)
We had checked your urine today and it does not have any signs of infection. We will have you go down to the lab so we can check on your blood work.  We will call you back with those results. You can come back in about 6 months.

## 2014-08-11 NOTE — Progress Notes (Signed)
Pre visit review using our clinic review tool, if applicable. No additional management support is needed unless otherwise documented below in the visit note. 

## 2014-08-12 ENCOUNTER — Ambulatory Visit (INDEPENDENT_AMBULATORY_CARE_PROVIDER_SITE_OTHER): Payer: 59

## 2014-08-12 DIAGNOSIS — Z7901 Long term (current) use of anticoagulants: Secondary | ICD-10-CM

## 2014-08-12 DIAGNOSIS — I2699 Other pulmonary embolism without acute cor pulmonale: Secondary | ICD-10-CM

## 2014-08-12 LAB — POCT INR: INR: 2.7

## 2014-08-13 NOTE — Assessment & Plan Note (Signed)
Doing much better on losartan and metoprolol. Check BMP today as she was restarted on ARB about one month ago.

## 2014-08-13 NOTE — Progress Notes (Signed)
   Subjective:    Patient ID: Monique [name], female    DOB: November 15, 1935, 78 y.o.   MRN: [id number]  HPI The patient is a 78 year old female comes in today to follow-up on her blood pressure. Since her last visit she is also seeing cardiology who restarted her losartan. She is not having any new problems with her blood pressure medication or side effects. She denies any headaches, chest pains, abdominal pains. She denies any new problems.  Review of Systems  Constitutional: Negative for fever, chills, activity change, appetite change, fatigue and unexpected weight change.  HENT: Negative.   Eyes: Negative.   Respiratory: Negative for cough, chest tightness, shortness of breath and wheezing.   Cardiovascular: Negative for chest pain, palpitations and leg swelling.  Gastrointestinal: Negative for nausea, abdominal pain, diarrhea, constipation, blood in stool and abdominal distention.  Genitourinary: Negative.   Musculoskeletal: Negative.   Skin: Negative.   Neurological: Negative for dizziness, weakness, light-headedness and headaches.      Objective:   Physical Exam  Constitutional: She is oriented to person, place, and time. She appears well-developed and well-nourished.  HENT:  Head: Normocephalic and atraumatic.  Eyes: EOM are normal.  Neck: Normal range of motion.  Cardiovascular: Normal rate, regular rhythm and intact distal pulses.   Pulmonary/Chest: Effort normal and breath sounds normal. No respiratory distress. She has no wheezes. She has no rales.  Abdominal: Soft. Bowel sounds are normal. She exhibits no distension. There is no tenderness. There is no rebound.  Musculoskeletal: She exhibits no edema or tenderness.  Neurological: She is alert and oriented to person, place, and time. Coordination normal.  Skin: Skin is warm and dry.   Filed Vitals:   08/11/14 1353  BP: 130/90  Pulse: 64  Temp: 97.8 F (36.6 C)  TempSrc: Oral  Height: 5\' 6"  (1.676 m)  Weight: 158 lb  (71.668 kg)  SpO2: 92%      Assessment & Plan:

## 2014-09-02 ENCOUNTER — Ambulatory Visit (INDEPENDENT_AMBULATORY_CARE_PROVIDER_SITE_OTHER): Payer: 59 | Admitting: *Deleted

## 2014-09-02 ENCOUNTER — Ambulatory Visit (INDEPENDENT_AMBULATORY_CARE_PROVIDER_SITE_OTHER): Payer: 59 | Admitting: Family

## 2014-09-02 ENCOUNTER — Encounter: Payer: Self-pay | Admitting: Family

## 2014-09-02 VITALS — BP 158/110 | HR 62 | Temp 97.7°F | Resp 16 | Ht 66.0 in | Wt 160.4 lb

## 2014-09-02 DIAGNOSIS — M542 Cervicalgia: Secondary | ICD-10-CM

## 2014-09-02 DIAGNOSIS — Z7901 Long term (current) use of anticoagulants: Secondary | ICD-10-CM

## 2014-09-02 DIAGNOSIS — I2699 Other pulmonary embolism without acute cor pulmonale: Secondary | ICD-10-CM

## 2014-09-02 LAB — POCT INR: INR: 2.5

## 2014-09-02 NOTE — Assessment & Plan Note (Signed)
Neck achiness most likely related to muscle skeletal origin. Continue over-the-counter Tylenol as needed for soreness. Recommend heat packs 2-3 times per day for 20 minutes at a time and stretching in all directions. If symptoms do not improve in approximately one week will follow-up with imaging and potential referral sports medicine.

## 2014-09-02 NOTE — Progress Notes (Signed)
   Subjective:    Patient ID: Monique [name], female    DOB: 11-Jul-1936, 78 y.o.   MRN: [id number]  Chief Complaint  Patient presents with  . Headache    has had pain in neck that goes in head, x1 week was taken off of some bp meds    HPI:  Monique [name] is a 78 y.o. female who presents today for a headache.  Acute symptoms of neck achiness in her cervical spine and left lateral side started about a week ago.  Denies any particular incidence that may have caused this start. Has tried Tylenol which has provided minimal relief. Over the course of the week it has stayed about the same.  Denies the worst headache of her life.   No Known Allergies  Current Outpatient Prescriptions on File Prior to Visit  Medication Sig Dispense Refill  . disopyramide (NORPACE) 150 MG capsule     . losartan (COZAAR) 25 MG tablet Take 25 mg by mouth daily.  3  . metoprolol succinate (TOPROL-XL) 50 MG 24 hr tablet   3  . simvastatin (ZOCOR) 40 MG tablet     . warfarin (COUMADIN) 5 MG tablet      No current facility-administered medications on file prior to visit.    Review of Systems  Constitutional: Negative for fever and chills.  Musculoskeletal: Negative for neck pain.       Positive for neck soreness and denies trauma  Neurological: Negative for weakness and numbness.      Objective:    BP 158/110 mmHg  Pulse 62  Temp(Src) 97.7 F (36.5 C) (Oral)  Resp 16  Ht 5\' 6"  (1.676 m)  Wt 160 lb 6.4 oz (72.757 kg)  BMI 25.90 kg/m2  SpO2 97% Nursing note and vital signs reviewed.  Retake blood pressure is 140/88  Physical Exam  Constitutional: She is oriented to person, place, and time. She appears well-developed and well-nourished. No distress.  Neck:  No obvious deformity, discoloration, or edema of neck noted. Palpable muscle spasm left lateral neck with mild tenderness. Neck range of motion is intact and appropriate.  Cardiovascular: Normal rate, regular rhythm, normal heart sounds and  intact distal pulses.   Pulmonary/Chest: Effort normal and breath sounds normal.  Neurological: She is alert and oriented to person, place, and time.  Skin: Skin is warm and dry.  Psychiatric: She has a normal mood and affect. Her behavior is normal. Judgment and thought content normal.       Assessment & Plan:

## 2014-09-02 NOTE — Progress Notes (Signed)
Pre visit review using our clinic review tool, if applicable. No additional management support is needed unless otherwise documented below in the visit note. 

## 2014-09-02 NOTE — Patient Instructions (Addendum)
Thank you for choosing [facility].  Summary/Instructions:  If your symptoms worsen or fail to improve, please contact our office for further instruction, or in case of emergency go directly to the emergency room at the closest medical facility.   Recommend to try over the counter Tylenol for pain.   Use heat packs 2-3 times per day as needed for relief. Stretch your neck in all directions.

## 2014-09-22 ENCOUNTER — Other Ambulatory Visit: Payer: Self-pay | Admitting: Internal Medicine

## 2014-09-24 ENCOUNTER — Telehealth: Payer: Self-pay | Admitting: Internal Medicine

## 2014-09-24 ENCOUNTER — Other Ambulatory Visit: Payer: Self-pay | Admitting: Geriatric Medicine

## 2014-09-24 MED ORDER — WARFARIN SODIUM 5 MG PO TABS: ORAL_TABLET | ORAL | Status: DC

## 2014-09-24 NOTE — Telephone Encounter (Signed)
Sent to pharmacy 

## 2014-09-24 NOTE — Telephone Encounter (Signed)
Pt request refill for warfarin (COUMADIN) 5 MG tablet, Please advise

## 2014-10-07 ENCOUNTER — Ambulatory Visit (INDEPENDENT_AMBULATORY_CARE_PROVIDER_SITE_OTHER): Payer: Self-pay | Admitting: *Deleted

## 2014-10-07 DIAGNOSIS — I2699 Other pulmonary embolism without acute cor pulmonale: Secondary | ICD-10-CM

## 2014-10-07 DIAGNOSIS — Z7901 Long term (current) use of anticoagulants: Secondary | ICD-10-CM

## 2014-10-07 LAB — POCT INR: INR: 3.3

## 2014-10-17 ENCOUNTER — Ambulatory Visit (INDEPENDENT_AMBULATORY_CARE_PROVIDER_SITE_OTHER): Payer: Medicare Other | Admitting: Physician Assistant

## 2014-10-17 ENCOUNTER — Ambulatory Visit (INDEPENDENT_AMBULATORY_CARE_PROVIDER_SITE_OTHER): Payer: Self-pay | Admitting: *Deleted

## 2014-10-17 ENCOUNTER — Telehealth: Payer: Self-pay | Admitting: Internal Medicine

## 2014-10-17 ENCOUNTER — Ambulatory Visit (INDEPENDENT_AMBULATORY_CARE_PROVIDER_SITE_OTHER)
Admission: RE | Admit: 2014-10-17 | Discharge: 2014-10-17 | Disposition: A | Discharge status: Home / Self Care | Payer: Medicare Other | Source: Ambulatory Visit | Attending: Physician Assistant | Admitting: Physician Assistant

## 2014-10-17 ENCOUNTER — Encounter: Payer: Self-pay | Admitting: Physician Assistant

## 2014-10-17 VITALS — BP 141/89 | HR 60 | Ht 66.0 in | Wt 159.4 lb

## 2014-10-17 DIAGNOSIS — I2699 Other pulmonary embolism without acute cor pulmonale: Secondary | ICD-10-CM

## 2014-10-17 DIAGNOSIS — Z7901 Long term (current) use of anticoagulants: Secondary | ICD-10-CM

## 2014-10-17 DIAGNOSIS — I609 Nontraumatic subarachnoid hemorrhage, unspecified: Secondary | ICD-10-CM | POA: Diagnosis not present

## 2014-10-17 DIAGNOSIS — R519 Headache, unspecified: Secondary | ICD-10-CM

## 2014-10-17 DIAGNOSIS — I480 Paroxysmal atrial fibrillation: Secondary | ICD-10-CM | POA: Diagnosis not present

## 2014-10-17 DIAGNOSIS — R51 Headache: Secondary | ICD-10-CM

## 2014-10-17 DIAGNOSIS — I6200 Nontraumatic subdural hemorrhage, unspecified: Secondary | ICD-10-CM | POA: Diagnosis not present

## 2014-10-17 LAB — POCT INR: INR: 2.6

## 2014-10-17 NOTE — Progress Notes (Signed)
Cardiology Office Note   Date:  10/17/2014   ID:  Monique [name], Monique [name] Jun 14, 1936, MRN [id number]  Patient Care Team: Olga Millers, MD as PCP - General (Internal Medicine) Fay Records, MD (Cardiology)    Rosaria Ferries, PA-C   Chief Complaint  Patient presents with  . Palpitations    History of Present Illness: Monique [name] is a 79 y.o. female who has a history of HOCM, HTN, HLD, PAF, and prior subarachnoid hemorrhage.No coumadin because of SAH, it was started 06/2014 after bilateral PE.   Called and having palpitations, dizziness, HTN.   Ms Backs has been tolerating the coumadin well. No bleeding issues. She denies chest pain, SOB, DOE, orthopnea, PND, LE edema.   She describes several episodes of palpitations over the last few weeks. They are not exertional, last several minutes to hours. They are associated with chest tightness, dizziness and weakness. She has not passed out or fallen, but sits down to keep from doing either one. The chest tightness resolves as soon as the palpitations stop. She never gets chest pain at any other time and can do house work, walk up steps, etc, without symptoms. She has not taken anything for the palpitations.   The only other issue is headaches. She has had several headaches, 1-2 per week in the last few weeks. They are severe 8/10 or more, all over her head, and last hours. The last one was earlier this week and lasted all night. She took Tylenol with some improvement, but still not really able to sleep due to the pain. She has also had some blurred vision at times, not necessarily with the headaches, but knows she has a cataract on her right eye and feels that may be responsible.  Past Medical History  Diagnosis Date  . PAF (paroxysmal atrial fibrillation) 2000  . LVH (left ventricular hypertrophy)   . Subarachnoid hemorrhage 2000  . Hyperlipidemia   . HTN (hypertension)     Essential  . Chronic anticoagulation 06/2014   Coumadin started after bilateral PE  . PE (pulmonary embolism) 06/2014    Bilateral    Past Surgical History  Procedure Laterality Date  . Abdominal hysterectomy    . Cardiac catheterization  05/2008    Nl cors, no gradient, EF 60%   Current Outpatient Prescriptions  Medication Sig Dispense Refill  . disopyramide (NORPACE) 150 MG capsule     . losartan (COZAAR) 25 MG tablet Take 25 mg by mouth daily.  3  . metoprolol succinate (TOPROL-XL) 50 MG 24 hr tablet   3  . simvastatin (ZOCOR) 40 MG tablet TAKE 1 TABLET (40 MG TOTAL) BY MOUTH AT BEDTIME. 30 tablet 3  . warfarin (COUMADIN) 5 MG tablet Take daily as instructed. 30 tablet 3   No current facility-administered medications for this visit.   Allergies:   Review of patient's allergies indicates no known allergies.   Social History:  The patient  reports that she quit smoking about 47 years ago. She has never used smokeless tobacco. She reports that she does not drink alcohol or use illicit drugs.   Family History:  The patient's family history includes Arrhythmia in her mother, sister, and sister; Cancer in her mother; Stroke in her mother.   - Nuclear study 12/02: No scar or ischemia, EF 71%.  - LHC 9/09: EF 60%, no CAD, no LVOT gradient. - Echocardiogram 06/2014: Severe LVH. Small LV cavity size. Normal EF. No LVOT gradient. No RWMAs.RV dysfunction seen,  new since 01/2014 study. The cavity size was mildly dilated. Agitated saline was injected and no obvious shunting from right to left seen. Moderate LVH, moderate intracavitary gradient, grade 3 diastolic dysfunction, no MR.   ROS:  Please see the history of present illness.   Otherwise, review of systems are positive for none.   All other systems are reviewed and negative.   PHYSICAL EXAM: VS:  BP 141/89 mmHg  Pulse 60  Ht 5\' 6"  (1.676 m)  Wt 159 lb 6.4 oz (72.303 kg)  BMI 25.74 kg/m2  SpO2 96% , BMI Body mass index is 25.74 kg/(m^2). GEN: Well nourished, well developed, in  no acute distress HEENT: normal Neck: no JVD, carotid bruits, or masses Cardiac: RRR; no murmurs, rubs, or gallops,no edema  Respiratory:  clear to auscultation bilaterally, normal work of breathing GI: soft, nontender, nondistended, + BS MS: no deformity or atrophy Skin: warm and dry, no rash Neuro:  Strength and sensation are intact Psych: euthymic mood, full affect   EKG:  EKG is ordered today. The ekg ordered today demonstrates SR, LA Dev, LVH - No change from 06/2014  Recent Labs: 12/27/2013: TSH 1.817 06/27/2014: ALT 21; Pro B Natriuretic peptide (BNP) 3231.0* 07/25/2014: Hemoglobin 14.6; Platelets 172.0 08/11/2014: BUN 22; Creatinine 1.4*; Potassium 3.9; Sodium 139  INR  Date/Time Value Ref Range Status  10/07/2014 04:15 PM 3.3  Final  09/02/2014 12:50 PM 2.5  Final  08/12/2014 12:56 PM 2.7  Final  07/29/2014 04:21 PM 3.5  Final  07/21/2014 09:54 AM 3.3  Final  07/14/2014 01:07 PM 4.6  Final   Lipid Panel    Component Value Date/Time   CHOL 161 12/24/2012 0925   TRIG 88.0 12/24/2012 0925   HDL 58.60 12/24/2012 0925   CHOLHDL 3 12/24/2012 0925   VLDL 17.6 12/24/2012 0925   LDLCALC 85 12/24/2012 0925   Wt Readings from Last 3 Encounters:  10/17/14 159 lb 6.4 oz (72.303 kg)  09/02/14 160 lb 6.4 oz (72.757 kg)  08/11/14 158 lb (71.668 kg)    Other studies Reviewed: Additional studies/ records that were reviewed today include: Cath, echo. Review of the above records demonstrates: No CAD, EF normal  ASSESSMENT AND PLAN: 1. Paroxysmal atrial fibrillation - Plan: EKG 12-Lead, Cardiac event monitor - continue current medications for now, if having frequent PAF, may need consideration of different antiarrhythmic  2. Headache above the eye region - Plan: CT Head Wo Contrast - with hx SAH (not on coumadin), and now on coumadin, concern for recurrent bleeding  3. Subarachnoid hemorrhage 2000 - pt says told at the time it was due to HTN, dramatic changes in BP, no  longer sees NS  4. Chronic anticoagulation - check coumadin today - change goal INR to 2.0-2.5 (smaller ranges not really possible)  Dr. Harrington Challenger in to see the patient and agrees with the plan of care.  Current medicines are reviewed at length with the patient today.  The patient does not have concerns regarding medicines.  The following changes have been made:  no change  Labs/ tests ordered today include:  Orders Placed This Encounter  Procedures  . CT Head Wo Contrast  . EKG 12-Lead  . Cardiac event monitor   Disposition:   FU with Dr Harrington Challenger when available.  Jonetta Speak, PA-C  10/17/2014 4:38 PM    [facility] [place], [place], [place]  [address] Phone: [phone]; Fax: [phone]

## 2014-10-17 NOTE — Patient Instructions (Signed)
Your physician recommends that you continue on your current medications as directed. Please refer to the Current Medication list given to you today.  Non-Cardiac CT scanning of head, (CAT scanning), is a noninvasive, special x-ray that produces cross-sectional images of the body using x-rays and a computer. CT scans help physicians diagnose and treat medical conditions. For some CT exams, a contrast material is used to enhance visibility in the area of the body being studied. CT scans provide greater clarity and reveal more details than regular x-ray exams.  Your physician has recommended that you wear an 30 day event monitor. Event monitors are medical devices that record the heart's electrical activity. Doctors most often [place] these monitors to diagnose arrhythmias. Arrhythmias are problems with the speed or rhythm of the heartbeat. The monitor is a small, portable device. You can wear one while you do your normal daily activities. This is usually used to diagnose what is causing palpitations/syncope (passing out).  Your physician recommends that you schedule a follow-up appointment 1st available with Dr. Harrington Challenger.

## 2014-10-17 NOTE — Telephone Encounter (Signed)
New problem:    Pt called to say she is not feeling well and would like to be seen.  Pt feels dizzy and heart racing off and on for the pass wk.

## 2014-10-17 NOTE — Telephone Encounter (Signed)
Patient states she has been feeling poorly over the past week with dizziness, occasional rapid heart rate and feeling like she has a high BP. BP yesterday 130/107, HR 94. Dr. Harrington Challenger has requested patient be seen today by Melina Copa, PA, today. Patient offered a 3:00 pm time slot today. She states she is having to arrange transportation and is worried she will not be able to get a ride that early. She will call us back if she is unable to make it. Advised that if she can not make it at 3 pm that she may want to consider going to ED so that she can be evaluated for the dizziness, high blood pressure and fast HR. Patient stated she will call her son to see if she can get here by 3 pm.

## 2014-10-23 ENCOUNTER — Encounter: Payer: Self-pay | Admitting: *Deleted

## 2014-10-23 ENCOUNTER — Encounter (INDEPENDENT_AMBULATORY_CARE_PROVIDER_SITE_OTHER): Payer: Medicare Other

## 2014-10-23 DIAGNOSIS — I480 Paroxysmal atrial fibrillation: Secondary | ICD-10-CM | POA: Diagnosis not present

## 2014-10-23 NOTE — Progress Notes (Signed)
Patient ID: Monique [name], female   DOB: 01-23-1936, 79 y.o.   MRN: [id number] Prevent[employer] verite 30 day cardiac event monitor applied to patient.

## 2014-10-30 ENCOUNTER — Telehealth: Payer: Self-pay | Admitting: Internal Medicine

## 2014-10-30 ENCOUNTER — Ambulatory Visit (INDEPENDENT_AMBULATORY_CARE_PROVIDER_SITE_OTHER): Payer: Medicare Other | Admitting: Physician Assistant

## 2014-10-30 ENCOUNTER — Encounter: Payer: Self-pay | Admitting: Physician Assistant

## 2014-10-30 ENCOUNTER — Ambulatory Visit (INDEPENDENT_AMBULATORY_CARE_PROVIDER_SITE_OTHER): Payer: Medicare Other | Admitting: *Deleted

## 2014-10-30 DIAGNOSIS — R079 Chest pain, unspecified: Secondary | ICD-10-CM

## 2014-10-30 DIAGNOSIS — I2699 Other pulmonary embolism without acute cor pulmonale: Secondary | ICD-10-CM | POA: Diagnosis not present

## 2014-10-30 DIAGNOSIS — Z7901 Long term (current) use of anticoagulants: Secondary | ICD-10-CM

## 2014-10-30 HISTORY — DX: Chest pain, unspecified: R07.9

## 2014-10-30 LAB — POCT INR: INR: 2.9

## 2014-10-30 MED ORDER — NITROGLYCERIN 0.4 MG SL SUBL: 0.4000 mg | SUBLINGUAL_TABLET | SUBLINGUAL | Status: DC | PRN

## 2014-10-30 NOTE — Patient Instructions (Signed)
INR today  Your physician has requested that you have a lexiscan myoview. For further information please visit [email or web address]. Please follow instruction sheet, as given.  Use your NTG under your tongue for recurrent chest pain. May take one tablet every 5 minutes. If you are still having discomfort after 3 tablets in 15 minutes, call 911.   Keep follow up as planned

## 2014-10-30 NOTE — Assessment & Plan Note (Signed)
The patient's description of her chest pain does have some typical features; tightness with radiation to the left arm. However, it occurs at rest and she's not had an exacerbation with ambulation or doing some chores at home. It is a fairly constant pain is resolved time she got here.  There are no acute EKG changes other than the flipped T-wave in V2.  No family history of coronary disease. We'll schedule a Lexiscan Myoview to be sure and provide her with some sublingual nitroglycerin to see if that makes a difference if she has another episode. That does not work she will try Tylenol does not work she will call the office.

## 2014-10-30 NOTE — Telephone Encounter (Signed)
Pt states that since last night she started having some shortness of breath and chest tightness and  left arm pain. Today she still having chest pain score of "8" she denies SOB. Pt states she had the same symptoms a week ago but not as bad as today., and the pain lasted 2 hours. Pt would like to be seen today. PA flex aware she will be seen at 9:30 AM< today. Pt is aware.

## 2014-10-30 NOTE — Telephone Encounter (Signed)
Pt c/o of Chest Pain: STAT if CP now or developed within 24 hours  1. Are you having CP right now? Pt describes pain as breast pain in left breast but does not describe as chest pain  2. Are you experiencing any other symptoms (ex. SOB, nausea, vomiting, sweating)? No  3. How long have you been experiencing CP? Since last night  4. Is your CP continuous or coming and going? Continuous  5. Have you taken Nitroglycerin? No  ?

## 2014-10-30 NOTE — Progress Notes (Signed)
Patient ID: Monique [name], female   DOB: [birth year], 79 y.o.   MRN: [id number]    Date:  10/30/2014   ID:  Monique [name], Monique [name] 03-14-1936, MRN [id number]  PCP:  Monique Millers, MD  Primary Cardiologist:  Harrington Challenger   Chief Complaint  Patient presents with  . Chest Pain     History of Present Illness: Monique [name] is a 79 y.o. female who has a history of HOCM, HTN, HLD, PAF, and prior subarachnoid hemorrhage.Se was started on coumadin 06/2014 after bilateral PE.   The patient was seen on Oct 17, 2014 with complaints of palpitations, dizziness, HTN, HA. CT scan of the head revealed stable subdural hygromas bilaterally with chronic remodeling in each frontal lobe region, stable. Minimal small vessel disease. No intracranial mass, acute hemorrhage, or acute infarct.   During her last office visit she described several episodes of palpitations over the few weeks prior. They were not exertional, last several minutes to hours. They are associated with chest tightness, dizziness and weakness. She has not passed out or fallen, but sits down to keep from doing either one. The chest tightness resolves as soon as the palpitations stop. She never gets chest pain at any other time and can do house work, walk up steps, etc, without symptoms. She has not taken anything for the palpitations.   Patient called requesting to be seen.  She reports developing chest tightness under her left breast last night approximately 10:30 to 11:00 at night and lasting all night after waking. This radiated into her left arm. It was 8 out of 10 in intensity.  She also described a chill or cold sensation.  While walking to the mailbox or walking into this building today there was no exacerbation of the tightness. She denies any nausea, vomiting, fever, shortness of breath, dizziness. She does have some achiness in her legs at times.   We checked with E cardio and there were no events recorded last night.  The data we got was  from the 18th and shows sinus rhythm with a lot of trash.  The patient reports she forgot to press the button last night.  The patient currently denies orthopnea, dizziness, PND, cough, congestion, abdominal pain, hematochezia, melena, lower extremity edema, claudication.  Wt Readings from Last 3 Encounters:  10/30/14 155 lb 3.2 oz (70.398 kg)  10/17/14 159 lb 6.4 oz (72.303 kg)  09/02/14 160 lb 6.4 oz (72.757 kg)     Past Medical History  Diagnosis Date  . PAF (paroxysmal atrial fibrillation) 2000  . LVH (left ventricular hypertrophy)   . Subarachnoid hemorrhage 2000  . Hyperlipidemia   . HTN (hypertension)     Essential  . Chronic anticoagulation 06/2014    Coumadin started after bilateral PE  . PE (pulmonary embolism) 06/2014    Bilateral    Current Outpatient Prescriptions  Medication Sig Dispense Refill  . disopyramide (NORPACE) 150 MG capsule Take 150 mg by mouth 2 (two) times daily.     Marland Kitchen losartan (COZAAR) 25 MG tablet Take 25 mg by mouth daily.  3  . metoprolol succinate (TOPROL-XL) 50 MG 24 hr tablet Take 50 mg by mouth daily.   3  . simvastatin (ZOCOR) 40 MG tablet TAKE 1 TABLET (40 MG TOTAL) BY MOUTH AT BEDTIME. 30 tablet 3  . warfarin (COUMADIN) 5 MG tablet Take daily as instructed. 30 tablet 3  . nitroGLYCERIN (NITROSTAT) 0.4 MG SL tablet Place 1 tablet (0.4 mg total) under the  tongue every 5 (five) minutes as needed for chest pain. 25 tablet 3   No current facility-administered medications for this visit.    Allergies:   No Known Allergies  Social History:  The patient  reports that she quit smoking about 47 years ago. She has never used smokeless tobacco. She reports that she does not drink alcohol or use illicit drugs.   Family history:   Family History  Problem Relation Age of Onset  . Cancer Mother   . Stroke Mother   . Arrhythmia Mother   . Arrhythmia Sister   . Arrhythmia Sister     ROS:  Please see the history of present illness.  All other  systems reviewed and negative.   PHYSICAL EXAM: VS:  BP 152/98 mmHg  Pulse 61  Ht 5\' 6"  (1.676 m)  Wt 155 lb 3.2 oz (70.398 kg)  BMI 25.06 kg/m2  SpO2 97% Well nourished, well developed, in no acute distress HEENT: Pupils are equal round react to light accommodation extraocular movements are intact.  Neck: no JVDNo cervical lymphadenopathy. Cardiac: Regular rate and rhythm without murmurs rubs or gallops. Lungs:  clear to auscultation bilaterally, no wheezing, rhonchi or rales Abd: soft, nontender, positive bowel sounds all quadrants, no hepatosplenomegaly Muscular skeletal: Ribs under her left breast are nontender. There is no exacerbation of pain in her left arm with active motion against assistance. Ext: no lower extremity edema.  2+ radial and dorsalis pedis pulses. Skin: warm and dry Neuro:  Grossly normal  EKG:  Normal sinus rhythm rate 61 bpm she does have some T-wave changes in 1 and aVL which were noted on prior EKGs.  ASSESSMENT AND PLAN:  Problem List Items Addressed This Visit    Chest pain    The patient's description of her chest pain does have some typical features; tightness with radiation to the left arm. However, it occurs at rest and she's not had an exacerbation with ambulation or doing some chores at home. It is a fairly constant pain is resolved time she got here.  There are no acute EKG changes other than the flipped T-wave in V2.  No family history of coronary disease. We'll schedule a Lexiscan Myoview to be sure and provide her with some sublingual nitroglycerin to see if that makes a difference if she has another episode. That does not work she will try Tylenol does not work she will call the office.      Relevant Medications   nitroGLYCERIN (NITROSTAT) SL tablet   Other Relevant Orders   EKG 12-Lead   Myocardial Perfusion Imaging    Other Visit Diagnoses    Chest pain, unspecified chest pain type        Relevant Medications    nitroGLYCERIN (NITROSTAT)  SL tablet    Other Relevant Orders    EKG 12-Lead    Myocardial Perfusion Imaging

## 2014-10-30 NOTE — Assessment & Plan Note (Signed)
Blood pressure elevated. Could be that she is a bit anxious about the pain she's been having. Not to make any changes to her current medications.

## 2014-11-01 ENCOUNTER — Encounter: Payer: Self-pay | Admitting: Family Medicine

## 2014-11-01 ENCOUNTER — Ambulatory Visit (INDEPENDENT_AMBULATORY_CARE_PROVIDER_SITE_OTHER): Payer: Medicare Other | Admitting: Family Medicine

## 2014-11-01 VITALS — BP 148/86 | HR 64 | Temp 97.4°F | Resp 16

## 2014-11-01 DIAGNOSIS — F411 Generalized anxiety disorder: Secondary | ICD-10-CM | POA: Diagnosis not present

## 2014-11-01 DIAGNOSIS — Z8679 Personal history of other diseases of the circulatory system: Secondary | ICD-10-CM

## 2014-11-01 DIAGNOSIS — R519 Headache, unspecified: Secondary | ICD-10-CM

## 2014-11-01 DIAGNOSIS — R51 Headache: Secondary | ICD-10-CM

## 2014-11-01 LAB — POCT CBC
Granulocyte percent: 51.9 %G (ref 37–80)
HCT, POC: 45.6 % (ref 37.7–47.9)
Hemoglobin: 14.6 g/dL (ref 12.2–16.2)
Lymph, poc: 4 — AB (ref 0.6–3.4)
MCH: 30.7 pg (ref 27–31.2)
MCHC: 32.1 g/dL (ref 31.8–35.4)
MCV: 95.5 fL (ref 80–97)
MID (cbc): 0.4 (ref 0–0.9)
MPV: 7.6 fL (ref 0–99.8)
POC Granulocyte: 4.8 (ref 2–6.9)
POC LYMPH %: 43.5 % (ref 10–50)
POC MID %: 4.6 %M (ref 0–12)
Platelet Count, POC: 165 10*3/uL (ref 142–424)
RBC: 4.77 M/uL (ref 4.04–5.48)
RDW, POC: 15.1 %
WBC: 9.2 10*3/uL (ref 4.6–10.2)

## 2014-11-01 LAB — POCT SEDIMENTATION RATE: POCT SED RATE: 15 mm/hr (ref 0–22)

## 2014-11-01 NOTE — Progress Notes (Signed)
Subjective: Patient had some left facial pain developed yesterday. It hurt her in the left cheek area, not really down in the jaw. It hurt from about the tooth line to the orbit. She also had a pain in the left side of her head. She is concerned that she might have further problems because she did have an intracranial bleed a decade ago. She has been seeing Dr. Harrington Challenger is her cardiologist. She saw the cardiologist PA 2 days ago. She has been anxious because of worrying about her having some heart failure and needing some other heart tests. No dizziness. The eyes have felt a little sore at times yesterday but she use some eyedrops and did better. No sore throat or nasal congestion. No chest pains or palpitations or shortness of breath. CT scan a few days ago did not show any acute changes. Her INR was good a couple of days ago.  Objective: Alert and oriented in no major distress. Was seen stat because of her past history. Her TMs are normal. Eyes PERRLA. EOMs intact. Throat clear. Neck supple without nodes or thyromegaly. No carotid bruits. Chest is clear to auscultation. Heart regular with a systolic murmur especially at the upper sternal borders. Abdomen soft and nontender. Extremities without edema. Her cranial nerves II-12 are grossly intact. Motor strength is symmetrical. Romberg negative. Finger to nose normal. Heel toe walk normal.  EKG has nonspecific changes but no acute issues noted  Assessment: Left facial pain, undetermined etiology, possibly from tension Mild left-sided headache History of old intracranial bleed.  Plan: CBC, sedimentation rate   Anxiety  Plan: Reassurance. Tylenol for the pain. No new other medicines today. Return or go to the emergency room if worse.  Results for orders placed or performed in visit on 11/01/14  POCT CBC  Result Value Ref Range   WBC 9.2 4.6 - 10.2 K/uL   Lymph, poc 4.0 (A) 0.6 - 3.4   POC LYMPH PERCENT 43.5 10 - 50 %L   MID (cbc) 0.4 0 - 0.9   POC MID % 4.6 0 - 12 %M   POC Granulocyte 4.8 2 - 6.9   Granulocyte percent 51.9 37 - 80 %G   RBC 4.77 4.04 - 5.48 M/uL   Hemoglobin 14.6 12.2 - 16.2 g/dL   HCT, POC 45.6 37.7 - 47.9 %   MCV 95.5 80 - 97 fL   MCH, POC 30.7 27 - 31.2 pg   MCHC 32.1 31.8 - 35.4 g/dL   RDW, POC 15.1 %   Platelet Count, POC 165 142 - 424 K/uL   MPV 7.6 0 - 99.8 fL

## 2014-11-01 NOTE — Patient Instructions (Signed)
No new medications today. Continue your current treatments.  In the event of getting acutely worse or something changing go to the emergency room or see her primary care doctor or return here if we are open.  Tylenol 2 pills every 6 or 8 hours as needed for the facial and head pain

## 2014-11-05 ENCOUNTER — Ambulatory Visit (INDEPENDENT_AMBULATORY_CARE_PROVIDER_SITE_OTHER): Payer: Medicare Other | Admitting: Internal Medicine

## 2014-11-05 ENCOUNTER — Encounter: Payer: Self-pay | Admitting: Internal Medicine

## 2014-11-05 VITALS — BP 148/90 | HR 73 | Temp 97.6°F | Resp 16 | Ht 66.0 in | Wt 155.0 lb

## 2014-11-05 DIAGNOSIS — R51 Headache: Secondary | ICD-10-CM

## 2014-11-05 DIAGNOSIS — R519 Headache, unspecified: Secondary | ICD-10-CM

## 2014-11-05 MED ORDER — TRAMADOL HCL 50 MG PO TABS: 50.0000 mg | ORAL_TABLET | Freq: Two times a day (BID) | ORAL | Status: DC | PRN

## 2014-11-05 MED ORDER — FLUTICASONE PROPIONATE 50 MCG/ACT NA SUSP: 2.0000 | Freq: Every day | NASAL | Status: DC

## 2014-11-05 NOTE — Progress Notes (Signed)
   Subjective:    Patient ID: Monique [name], female    DOB: 1936/08/08, 79 y.o.   MRN: [id number]  HPI The patient is a 79 YO female who is coming in for headaches. They have been going on everyday for about 1 month. She has seen cardiology and family medicine about this and has had CT head and ESR both of which were without obvious pathology. She is taking tylenol about every 6-8 hours since they started. She woke up with a headache 4-5/10 this morning and took tylenol. Sometimes it works well and sometimes does not do well. Her eyes feel itchy and dry. Denies nasal congestion or sinus pressure. Denies ear pain or tenderness.   Review of Systems  Constitutional: Negative for fever, chills, activity change, appetite change, fatigue and unexpected weight change.  HENT: Positive for sinus pressure. Negative for congestion, ear discharge, ear pain, sore throat and trouble swallowing.   Eyes: Positive for itching.  Respiratory: Negative for cough, chest tightness, shortness of breath and wheezing.   Cardiovascular: Negative for chest pain, palpitations and leg swelling.  Gastrointestinal: Negative for nausea, abdominal pain, diarrhea, constipation, blood in stool and abdominal distention.  Genitourinary: Negative.   Musculoskeletal: Negative.   Skin: Negative.   Neurological: Negative for dizziness, weakness and light-headedness.      Objective:   Physical Exam  Constitutional: She is oriented to person, place, and time. She appears well-developed and well-nourished.  HENT:  Head: Normocephalic and atraumatic.  Right Ear: External ear normal.  Left Ear: External ear normal.  Oropharynx with redness, no purulent drainage.   Eyes: EOM are normal.  Neck: Normal range of motion.  Cardiovascular: Normal rate, regular rhythm and intact distal pulses.   Pulmonary/Chest: Effort normal and breath sounds normal. No respiratory distress. She has no wheezes. She has no rales.  Abdominal: Soft.  Bowel sounds are normal. She exhibits no distension. There is no tenderness. There is no rebound.  Musculoskeletal: She exhibits no edema or tenderness.  Neurological: She is alert and oriented to person, place, and time. Coordination normal.  Skin: Skin is warm and dry.   Filed Vitals:   11/05/14 0847  BP: 148/90  Pulse: 73  Temp: 97.6 F (36.4 C)  TempSrc: Oral  Resp: 16  Height: $Remove'5\' 6"'SeouNuE$  (1.676 m)  Weight: 155 lb (70.308 kg)  SpO2: 97%      Assessment & Plan:

## 2014-11-05 NOTE — Progress Notes (Signed)
Pre visit review using our clinic review tool, if applicable. No additional management support is needed unless otherwise documented below in the visit note. 

## 2014-11-05 NOTE — Patient Instructions (Signed)
We have sent in the nose spray called flonase that you can use for the the eye symptoms and to see if it helps with the headaches.   We have given you some tramadol for the headache. You can take 1 for a severe headache. We would like you to try to stop using the tylenol or tramadol if you can manage the headache. I think that your brain has gotten used to the tylenol and gets a headache when you do not have medicine. If you are able to decrease the amount of tylenol you are taking it should pass in 1-2 weeks.   Analgesic Rebound Headaches An analgesic rebound headache is a headache that returns after pain medicine (analgesic) that was taken to treat the initial headache wears off. People who suffer from tension, migraine, or cluster headaches are at risk for developing rebound headaches. Any type of primary headache can return as a rebound headache if you regularly take analgesics more than three times a week. If the cycle of rebound headaches continues, they become chronic daily headaches.  CAUSES Analgesics frequently associated with this problem include common over-the-counter medicines like aspirin, ibuprofen, acetaminophen, sinus relief medicines, and other medicines that contain caffeine. Narcotic pain medicines are also a common cause of rebound headaches.  SIGNS AND SYMPTOMS The symptoms of rebound headaches are the same as the symptoms of your initial headache. Symptoms of specific types of headaches include: Tension headache  Pressure around the head.  Dull, aching head pain.  Pain felt over the front and sides of the head.  Tenderness in the muscles of the head, neck and shoulders. Migraine Headache  Pulsing or throbbing pain on one or both sides of the head.  Severe pain that interferes with daily activities.  Pain that is worsened by physical activity.  Nausea, vomiting, or both.  Pain with exposure to bright light, loud noises, or strong smells.  General sensitivity to  bright light, loud noises, or strong smells.  Visual changes.  Numbness of one or both arms. Cluster Headaches  Severe pain that begins in or around one eye or temple.  Redness in the eye on the same side as the pain.  Droopy or swollen eyelid.  One-sided head pain.  Nausea.  Runny nose.  Sweaty, pale facial skin.  Restlessness. DIAGNOSIS  Analgesic rebound headaches are diagnosed by reviewing your medical history. This includes the nature of your initial headaches, as well as the type of pain medicines you have been using to treat your headaches and how often you take them. TREATMENT Discontinuing frequent use of the analgesic medicine will typically reduce the frequency of the rebound episodes. This may initially worsen your headaches but eventually the pain should become more manageable, less frequent, and less severe.  Seeing a headache specialists may helpful. He or she may be able to help you manage your headaches and to make sure there is not another cause of the headaches. Alternative methods of stress relief such as acupuncture, counseling, biofeedback, and massage may also be helpful. Talk with your health care provider about which alternative treatments might be good for you. HOME CARE INSTRUCTIONS Stopping the regular use of pain medicine can be difficult. Follow your health care provider's instructions carefully. Keep all of your appointments. Avoid triggers that are known to cause your primary headaches. SEEK MEDICAL CARE IF: You continue to experience headaches after following your health care provider's recommended treatments. SEEK IMMEDIATE MEDICAL CARE IF:  You develop new headache pain.  You develop headache pain that is different than what you have experienced in the past.  You develop numbness or tingling in your arms or legs.  You develop changes in your speech or vision. MAKE SURE YOU:  Understand these instructions.  Will watch your child's  condition.  Will get help right away if your child is not doing well or gets worse. Document Released: 11/12/2003 Document Revised: 01/06/2014 Document Reviewed: 03/07/2013 [facility] Patient Information 2015 [place], [place]. This information is not intended to replace advice given to you by your health care provider. Make sure you discuss any questions you have with your health care provider.

## 2014-11-05 NOTE — Assessment & Plan Note (Signed)
CT head negative for bleed or changes, ESR negative for temporal arteritis. At this point unclear if there is a portion from sinuses (redness in oropharynx on exam). Also could be analgesic rebound and talked with her about that. Tramadol small amount given and advise her to use sparingly. Also she will try flonase for 2 weeks to see if it helps.

## 2014-11-07 ENCOUNTER — Ambulatory Visit (HOSPITAL_COMMUNITY): Payer: Medicare Other | Attending: Cardiology | Admitting: Radiology

## 2014-11-07 DIAGNOSIS — I10 Essential (primary) hypertension: Secondary | ICD-10-CM | POA: Diagnosis not present

## 2014-11-07 DIAGNOSIS — R0602 Shortness of breath: Secondary | ICD-10-CM | POA: Insufficient documentation

## 2014-11-07 DIAGNOSIS — R079 Chest pain, unspecified: Secondary | ICD-10-CM | POA: Diagnosis not present

## 2014-11-07 DIAGNOSIS — E785 Hyperlipidemia, unspecified: Secondary | ICD-10-CM | POA: Diagnosis not present

## 2014-11-07 MED ORDER — REGADENOSON 0.4 MG/5ML IV SOLN
0.4000 mg | Freq: Once | INTRAVENOUS | Status: AC
  Administered 2014-11-07: 0.4 mg via INTRAVENOUS

## 2014-11-07 MED ORDER — TECHNETIUM TC 99M SESTAMIBI GENERIC - CARDIOLITE
11.0000 | Freq: Once | INTRAVENOUS | Status: AC | PRN
  Administered 2014-11-07: 11 via INTRAVENOUS

## 2014-11-07 MED ORDER — TECHNETIUM TC 99M SESTAMIBI GENERIC - CARDIOLITE
33.0000 | Freq: Once | INTRAVENOUS | Status: AC | PRN
  Administered 2014-11-07: 33 via INTRAVENOUS

## 2014-11-07 NOTE — Progress Notes (Signed)
[facility]UCLE[facility] [place], [place] [address] [phone]    Cardiology Nuclear Med Study  Monique [name] is a 79 y.o. female     MRN : [id number]     DOB: [birth year]  Procedure Date: 11/07/2014  Nuclear Med Background Indication for Stress Test:  Evaluation for Ischemia History:  No prior known history of CAD, AFIB Cardiac Risk Factors: Hypertension and Lipids  Symptoms: Chest Pain without exertion (last occurrence 2 days ago),DOE and SOB   Nuclear Pre-Procedure Caffeine/Decaff Intake:  None> 12 hrs NPO After: 7:00am   Lungs:  clear O2 Sat: 98% on room air. IV 0.9% NS with Angio Cath:  22g  IV Site: R Hand x 1, tolerated well IV Started by:  Irven Baltimore, RN  Chest Size (in):  40 Cup Size: D  Height: 5\' 6"  (1.676 m)  Weight:  152 lb (68.947 kg)  BMI:  Body mass index is 24.55 kg/(m^2). Tech Comments:  Patient took Toprol this am. Irven Baltimore, RN.    Nuclear Med Study 1 or 2 day study: 1 day  Stress Test Type:  Lexiscan  Reading MD: N/A  Order Authorizing Provider:  Dorris Carnes, MD  Resting Radionuclide: Technetium 66m Sestamibi  Resting Radionuclide Dose: 11.0 mCi   Stress Radionuclide:  Technetium 64m Sestamibi  Stress Radionuclide Dose: 33.0 mCi           Stress Protocol Rest HR: 61 Stress HR: 88  Rest BP: 165/92 Stress BP: 174/99  Exercise Time (min): n/a METS: n/a   Predicted Max HR: 144 bpm % Max HR: 61.11 bpm Rate Pressure Product: 15312   Dose of Adenosine (mg):  n/a Dose of Lexiscan: 0.4 mg  Dose of Atropine (mg): n/a Dose of Dobutamine: n/a mcg/kg/min (at max HR)  Stress Test Technologist: Irven Baltimore, RN  Nuclear Technologist:  Earl Many, CNMT     Rest Procedure:  Myocardial perfusion imaging was performed at rest 45 minutes following the intravenous administration of Technetium 33m Sestamibi. Rest ECG: NSR with non-specific ST-T wave changes  Stress Procedure:  The patient received IV Lexiscan 0.4 mg  over 15-seconds.  Technetium 21m Sestamibi injected at 30-seconds.  The patient complained of chest burning, and hands tingling with Lexiscan.Quantitative spect images were obtained after a 45 minute delay. Stress ECG: No significant change from baseline ECG  QPS Raw Data Images:  Normal; no motion artifact; normal heart/lung ratio. Stress Images:  Normal homogeneous uptake in all areas of the myocardium. Rest Images:  Normal homogeneous uptake in all areas of the myocardium. Subtraction (SDS):  No evidence of ischemia. Transient Ischemic Dilatation (Normal <1.22):  0.93 Lung/Heart Ratio (Normal <0.45):  0.27  Quantitative Gated Spect Images QGS EDV:  64 ml QGS ESV:  25 ml  Impression Exercise Capacity:  Lexiscan with no exercise. BP Response:  Normal blood pressure response. Clinical Symptoms:  No significant symptoms noted. ECG Impression:  No significant ECG changes with Lexiscan. Comparison with Prior Nuclear Study: No previous nuclear study performed  Overall Impression:  Normal stress nuclear study.  LV Ejection Fraction: 61%.  LV Wall Motion:  NL LV Function; NL Wall Motion  Darlin Coco MD

## 2014-11-12 ENCOUNTER — Telehealth: Payer: Self-pay | Admitting: Physician Assistant

## 2014-11-12 NOTE — Telephone Encounter (Signed)
Stress test results given to patient. Atharva Mirsky PA-C

## 2014-11-13 ENCOUNTER — Ambulatory Visit (INDEPENDENT_AMBULATORY_CARE_PROVIDER_SITE_OTHER): Payer: Self-pay | Admitting: Pharmacist Clinician (PhC)/ Clinical Pharmacy Specialist

## 2014-11-13 DIAGNOSIS — Z7901 Long term (current) use of anticoagulants: Secondary | ICD-10-CM

## 2014-11-13 DIAGNOSIS — I2699 Other pulmonary embolism without acute cor pulmonale: Secondary | ICD-10-CM

## 2014-11-13 LAB — POCT INR: INR: 2.3

## 2014-11-20 ENCOUNTER — Ambulatory Visit (INDEPENDENT_AMBULATORY_CARE_PROVIDER_SITE_OTHER): Payer: Medicare Other | Admitting: Internal Medicine

## 2014-11-20 ENCOUNTER — Other Ambulatory Visit (INDEPENDENT_AMBULATORY_CARE_PROVIDER_SITE_OTHER): Payer: Medicare Other

## 2014-11-20 ENCOUNTER — Encounter: Payer: Self-pay | Admitting: Internal Medicine

## 2014-11-20 VITALS — BP 176/100 | HR 70 | Temp 97.5°F | Resp 16 | Ht 66.0 in | Wt 152.0 lb

## 2014-11-20 DIAGNOSIS — R002 Palpitations: Secondary | ICD-10-CM

## 2014-11-20 DIAGNOSIS — F411 Generalized anxiety disorder: Secondary | ICD-10-CM | POA: Diagnosis not present

## 2014-11-20 DIAGNOSIS — I10 Essential (primary) hypertension: Secondary | ICD-10-CM

## 2014-11-20 LAB — CBC WITH DIFFERENTIAL/PLATELET
BASOS ABS: 0 10*3/uL (ref 0.0–0.1)
BASOS PCT: 0.2 % (ref 0.0–3.0)
EOS PCT: 0.7 % (ref 0.0–5.0)
Eosinophils Absolute: 0.1 10*3/uL (ref 0.0–0.7)
HEMATOCRIT: 43.7 % (ref 36.0–46.0)
Hemoglobin: 14.5 g/dL (ref 12.0–15.0)
LYMPHS PCT: 48.3 % — AB (ref 12.0–46.0)
Lymphs Abs: 4.4 10*3/uL — ABNORMAL HIGH (ref 0.7–4.0)
MCHC: 33.3 g/dL (ref 30.0–36.0)
MCV: 93.2 fl (ref 78.0–100.0)
MONOS PCT: 7.4 % (ref 3.0–12.0)
Monocytes Absolute: 0.7 10*3/uL (ref 0.1–1.0)
Neutro Abs: 3.9 10*3/uL (ref 1.4–7.7)
Neutrophils Relative %: 43.4 % (ref 43.0–77.0)
Platelets: 181 10*3/uL (ref 150.0–400.0)
RBC: 4.69 Mil/uL (ref 3.87–5.11)
RDW: 14.6 % (ref 11.5–15.5)
WBC: 9.1 10*3/uL (ref 4.0–10.5)

## 2014-11-20 MED ORDER — LOSARTAN POTASSIUM 100 MG PO TABS: 100.0000 mg | ORAL_TABLET | Freq: Every day | ORAL | Status: DC

## 2014-11-20 MED ORDER — SERTRALINE HCL 50 MG PO TABS: 50.0000 mg | ORAL_TABLET | Freq: Every day | ORAL | Status: DC

## 2014-11-20 NOTE — Progress Notes (Signed)
Pre visit review using our clinic review tool, if applicable. No additional management support is needed unless otherwise documented below in the visit note. 

## 2014-11-20 NOTE — Progress Notes (Signed)
   Subjective:    Patient ID: Monique [name], female    DOB: 15-Jan-1936, 79 y.o.   MRN: [id number]  HPI She has intermittent headaches,dizziness," heart fluttering & nervousness". She will monitor blood pressure on average once a week and it has been as high as 200s over 122. She does have an event monitor to evaluate palpitations which occur 1-2 times per week. She has become anxious in the last 3 weeks. This is associated with some difficulty sleeping and right supraorbital headaches., Tramadol has been prescribed for the headaches.  The anxiety symptoms are better when she's @ her son's house. She did retire in December 2015.  She has had weight loss since December;  documented @ 8# 6 oz.  Her sister does have thyroid disease. Her sister had a CVA in her early 55s and her mother had a CVA at 107.  Review of the chart reveals her last TSH was 1.817 in April 2015. She has had mild renal insufficiency.    Review of Systems The headaches are not associated with paroxysmal chest pain, flushing, & diarrhea.  She does have some chills and sweats.  She also describes some anorexia.  She denies significant change in hair, skin, nails.  She has no intolerance to heat or cold.  Exertional dyspnea, paroxysmal nocturnal dyspnea, claudication or edema are absent.       Objective:   Physical Exam   Pertinent or positive findings include: Arcus senilis is noted.  There is an irregular nevus 6 x 10 mm over the right maxillary area. There is a central papule/skin tag associated with this.  She has an upper plate and lower partial.  There is accentuated curvature of the thoracic spine.  Pedal pulses are decreased, especially dorsalis pedis pulses.   Gen.:  Thin but adequately nourished; in no acute distress Eyes: Extraocular motion intact; no lid lag , proptosis , or nystagmus Neck: full ROM; no masses ; thyroid normal  Heart: Normal rhythm and rate (pulse 70) without significant  murmur, gallop, or extra heart sounds Lungs: Chest clear to auscultation without rales,rales, wheezes Neuro:Deep tendon reflexes are equal and within normal limits; no tremor  Skin/Nails: Warm and dry without significant lesions or rashes; onycholysis can not be assessed as nails painted Lymphatic: no cervical or axillary LA Psych: Normally communicative and interactive; no abnormal mood or affect clinically.         Assessment & Plan:  #1 palpitations   #2 uncontrolled hypertension  #3 anxiety  Plan: The losartan will be increased to 100 mg daily. Extensive labs including 24 urine for catecholamines and metanephrines will be collected.

## 2014-11-20 NOTE — Patient Instructions (Signed)
  Your next office appointment will be determined based upon review of your pending labs  Those instructions will be transmitted to you by mail.   Critical values will be called. Followup as needed for any active or acute issue. Please report any significant change in your symptoms.  Minimal Blood Pressure Goal= AVERAGE < 140/90;  Ideal is an AVERAGE < 135/85. This AVERAGE should be calculated from @ least 5-7 BP readings taken @ different times of day on different days of week. You should not respond to isolated BP readings , but rather the AVERAGE for that week .Please bring your  blood pressure cuff to office visits to verify that it is reliable.It  can also be checked against the blood pressure device at the pharmacy. Finger or wrist cuffs are not dependable; an arm cuff is.

## 2014-11-21 ENCOUNTER — Emergency Department (HOSPITAL_COMMUNITY): Payer: Medicare Other

## 2014-11-21 ENCOUNTER — Encounter (HOSPITAL_COMMUNITY): Payer: Self-pay | Admitting: *Deleted

## 2014-11-21 ENCOUNTER — Inpatient Hospital Stay (HOSPITAL_COMMUNITY)
Admission: EM | Admit: 2014-11-21 | Discharge: 2014-11-28 | DRG: 176 | Disposition: A | Discharge status: Home Health | Payer: Medicare Other | Attending: Internal Medicine | Admitting: Internal Medicine

## 2014-11-21 DIAGNOSIS — I480 Paroxysmal atrial fibrillation: Secondary | ICD-10-CM | POA: Diagnosis not present

## 2014-11-21 DIAGNOSIS — N183 Chronic kidney disease, stage 3 unspecified: Secondary | ICD-10-CM

## 2014-11-21 DIAGNOSIS — R0789 Other chest pain: Secondary | ICD-10-CM | POA: Diagnosis present

## 2014-11-21 DIAGNOSIS — I82409 Acute embolism and thrombosis of unspecified deep veins of unspecified lower extremity: Secondary | ICD-10-CM | POA: Diagnosis present

## 2014-11-21 DIAGNOSIS — E1169 Type 2 diabetes mellitus with other specified complication: Secondary | ICD-10-CM | POA: Diagnosis present

## 2014-11-21 DIAGNOSIS — R51 Headache: Secondary | ICD-10-CM | POA: Diagnosis not present

## 2014-11-21 DIAGNOSIS — R42 Dizziness and giddiness: Secondary | ICD-10-CM

## 2014-11-21 DIAGNOSIS — Z8249 Family history of ischemic heart disease and other diseases of the circulatory system: Secondary | ICD-10-CM

## 2014-11-21 DIAGNOSIS — R0600 Dyspnea, unspecified: Secondary | ICD-10-CM | POA: Diagnosis not present

## 2014-11-21 DIAGNOSIS — I421 Obstructive hypertrophic cardiomyopathy: Secondary | ICD-10-CM | POA: Diagnosis present

## 2014-11-21 DIAGNOSIS — Z87891 Personal history of nicotine dependence: Secondary | ICD-10-CM

## 2014-11-21 DIAGNOSIS — I6200 Nontraumatic subdural hemorrhage, unspecified: Secondary | ICD-10-CM | POA: Diagnosis not present

## 2014-11-21 DIAGNOSIS — Z8673 Personal history of transient ischemic attack (TIA), and cerebral infarction without residual deficits: Secondary | ICD-10-CM | POA: Diagnosis not present

## 2014-11-21 DIAGNOSIS — Z823 Family history of stroke: Secondary | ICD-10-CM

## 2014-11-21 DIAGNOSIS — Z7951 Long term (current) use of inhaled steroids: Secondary | ICD-10-CM | POA: Diagnosis not present

## 2014-11-21 DIAGNOSIS — I10 Essential (primary) hypertension: Secondary | ICD-10-CM | POA: Diagnosis present

## 2014-11-21 DIAGNOSIS — Z79899 Other long term (current) drug therapy: Secondary | ICD-10-CM | POA: Diagnosis not present

## 2014-11-21 DIAGNOSIS — I951 Orthostatic hypotension: Secondary | ICD-10-CM | POA: Diagnosis not present

## 2014-11-21 DIAGNOSIS — E785 Hyperlipidemia, unspecified: Secondary | ICD-10-CM | POA: Diagnosis not present

## 2014-11-21 DIAGNOSIS — D72829 Elevated white blood cell count, unspecified: Secondary | ICD-10-CM | POA: Diagnosis present

## 2014-11-21 DIAGNOSIS — G43909 Migraine, unspecified, not intractable, without status migrainosus: Secondary | ICD-10-CM | POA: Diagnosis not present

## 2014-11-21 DIAGNOSIS — R7989 Other specified abnormal findings of blood chemistry: Secondary | ICD-10-CM | POA: Diagnosis not present

## 2014-11-21 DIAGNOSIS — I129 Hypertensive chronic kidney disease with stage 1 through stage 4 chronic kidney disease, or unspecified chronic kidney disease: Secondary | ICD-10-CM | POA: Diagnosis present

## 2014-11-21 DIAGNOSIS — R002 Palpitations: Secondary | ICD-10-CM | POA: Diagnosis present

## 2014-11-21 DIAGNOSIS — Z9889 Other specified postprocedural states: Secondary | ICD-10-CM | POA: Diagnosis not present

## 2014-11-21 DIAGNOSIS — Z86718 Personal history of other venous thrombosis and embolism: Secondary | ICD-10-CM | POA: Diagnosis not present

## 2014-11-21 DIAGNOSIS — I509 Heart failure, unspecified: Secondary | ICD-10-CM | POA: Diagnosis not present

## 2014-11-21 DIAGNOSIS — I82411 Acute embolism and thrombosis of right femoral vein: Secondary | ICD-10-CM | POA: Diagnosis present

## 2014-11-21 DIAGNOSIS — Z86711 Personal history of pulmonary embolism: Secondary | ICD-10-CM | POA: Diagnosis not present

## 2014-11-21 DIAGNOSIS — R079 Chest pain, unspecified: Secondary | ICD-10-CM | POA: Diagnosis not present

## 2014-11-21 DIAGNOSIS — I2699 Other pulmonary embolism without acute cor pulmonale: Secondary | ICD-10-CM | POA: Diagnosis not present

## 2014-11-21 DIAGNOSIS — Z7901 Long term (current) use of anticoagulants: Secondary | ICD-10-CM

## 2014-11-21 DIAGNOSIS — I4891 Unspecified atrial fibrillation: Secondary | ICD-10-CM | POA: Diagnosis present

## 2014-11-21 DIAGNOSIS — R519 Headache, unspecified: Secondary | ICD-10-CM

## 2014-11-21 DIAGNOSIS — R778 Other specified abnormalities of plasma proteins: Secondary | ICD-10-CM | POA: Diagnosis not present

## 2014-11-21 DIAGNOSIS — Z803 Family history of malignant neoplasm of breast: Secondary | ICD-10-CM | POA: Diagnosis not present

## 2014-11-21 DIAGNOSIS — R011 Cardiac murmur, unspecified: Secondary | ICD-10-CM | POA: Diagnosis not present

## 2014-11-21 HISTORY — DX: Chronic kidney disease, stage 3 unspecified: N18.30

## 2014-11-21 LAB — CBC
HEMATOCRIT: 44.6 % (ref 36.0–46.0)
Hemoglobin: 14.8 g/dL (ref 12.0–15.0)
MCH: 31.3 pg (ref 26.0–34.0)
MCHC: 33.2 g/dL (ref 30.0–36.0)
MCV: 94.3 fL (ref 78.0–100.0)
Platelets: 189 10*3/uL (ref 150–400)
RBC: 4.73 MIL/uL (ref 3.87–5.11)
RDW: 13.9 % (ref 11.5–15.5)
WBC: 11.6 10*3/uL — AB (ref 4.0–10.5)

## 2014-11-21 LAB — BASIC METABOLIC PANEL
Anion gap: 8 (ref 5–15)
BUN: 17 mg/dL (ref 6–23)
BUN: 16 mg/dL (ref 6–23)
CHLORIDE: 105 meq/L (ref 96–112)
CHLORIDE: 106 mmol/L (ref 96–112)
CO2: 29 meq/L (ref 19–32)
CO2: 26 mmol/L (ref 19–32)
CREATININE: 1.28 mg/dL — AB (ref 0.40–1.20)
Calcium: 9.5 mg/dL (ref 8.4–10.5)
Calcium: 9.4 mg/dL (ref 8.4–10.5)
Creatinine, Ser: 1.32 mg/dL — ABNORMAL HIGH (ref 0.50–1.10)
GFR calc Af Amer: 44 mL/min — ABNORMAL LOW (ref >90)
GFR calc non Af Amer: 38 mL/min — ABNORMAL LOW (ref >90)
GFR: 52.05 mL/min — AB (ref >60.00)
GLUCOSE: 162 mg/dL — AB (ref 70–99)
Glucose, Bld: 112 mg/dL — ABNORMAL HIGH (ref 70–99)
POTASSIUM: 4 meq/L (ref 3.5–5.1)
POTASSIUM: 3.7 mmol/L (ref 3.5–5.1)
SODIUM: 140 meq/L (ref 135–145)
SODIUM: 140 mmol/L (ref 135–145)

## 2014-11-21 LAB — TSH: TSH: 0.91 u[IU]/mL (ref 0.35–4.50)

## 2014-11-21 LAB — T3, FREE: T3, Free: 3.7 pg/mL (ref 2.3–4.2)

## 2014-11-21 LAB — T4, FREE: Free T4: 0.97 ng/dL (ref 0.60–1.60)

## 2014-11-21 LAB — PROTIME-INR
INR: 3.21 — ABNORMAL HIGH (ref 0.00–1.49)
Prothrombin Time: 33.1 seconds — ABNORMAL HIGH (ref 11.6–15.2)

## 2014-11-21 LAB — MAGNESIUM: MAGNESIUM: 2.1 mg/dL (ref 1.5–2.5)

## 2014-11-21 LAB — I-STAT TROPONIN, ED: Troponin i, poc: 0 ng/mL (ref 0.00–0.08)

## 2014-11-21 LAB — BRAIN NATRIURETIC PEPTIDE: B Natriuretic Peptide: 68.3 pg/mL (ref 0.0–100.0)

## 2014-11-21 MED ORDER — FLUTICASONE PROPIONATE 50 MCG/ACT NA SUSP
2.0000 | Freq: Every day | NASAL | Status: DC
  Administered 2014-11-22 – 2014-11-28 (×7): 2 via NASAL
  Filled 2014-11-21: qty 16

## 2014-11-21 MED ORDER — SODIUM CHLORIDE 0.9 % IJ SOLN: 3.0000 mL | Freq: Two times a day (BID) | INTRAMUSCULAR | Status: DC

## 2014-11-21 MED ORDER — ENOXAPARIN SODIUM 80 MG/0.8ML ~~LOC~~ SOLN
70.0000 mg | SUBCUTANEOUS | Status: DC
  Filled 2014-11-21: qty 0.8

## 2014-11-21 MED ORDER — ONDANSETRON HCL 4 MG PO TABS: 4.0000 mg | ORAL_TABLET | Freq: Four times a day (QID) | ORAL | Status: DC | PRN

## 2014-11-21 MED ORDER — IOHEXOL 350 MG/ML SOLN
80.0000 mL | Freq: Once | INTRAVENOUS | Status: AC | PRN
  Administered 2014-11-21: 80 mL via INTRAVENOUS

## 2014-11-21 MED ORDER — ASPIRIN 325 MG PO TABS
325.0000 mg | ORAL_TABLET | ORAL | Status: AC
  Administered 2014-11-21: 325 mg via ORAL
  Filled 2014-11-21: qty 1

## 2014-11-21 MED ORDER — SODIUM CHLORIDE 0.9 % IJ SOLN: 3.0000 mL | INTRAMUSCULAR | Status: DC | PRN

## 2014-11-21 MED ORDER — SODIUM CHLORIDE 0.9 % IV SOLN: 250.0000 mL | INTRAVENOUS | Status: DC | PRN

## 2014-11-21 MED ORDER — MORPHINE SULFATE 4 MG/ML IJ SOLN
4.0000 mg | Freq: Once | INTRAMUSCULAR | Status: AC
  Administered 2014-11-21: 4 mg via INTRAVENOUS
  Filled 2014-11-21: qty 1

## 2014-11-21 MED ORDER — POLYETHYLENE GLYCOL 3350 17 G PO PACK
17.0000 g | PACK | Freq: Every day | ORAL | Status: DC | PRN
  Administered 2014-11-28: 17 g via ORAL
  Filled 2014-11-21: qty 1

## 2014-11-21 MED ORDER — DISOPYRAMIDE PHOSPHATE 150 MG PO CAPS
150.0000 mg | ORAL_CAPSULE | Freq: Two times a day (BID) | ORAL | Status: DC
  Administered 2014-11-21 – 2014-11-28 (×15): 150 mg via ORAL
  Filled 2014-11-21 (×17): qty 1

## 2014-11-21 MED ORDER — TRAMADOL HCL 50 MG PO TABS
50.0000 mg | ORAL_TABLET | Freq: Two times a day (BID) | ORAL | Status: DC | PRN
Start: 2014-11-21 — End: 2014-11-28
  Administered 2014-11-21 – 2014-11-27 (×6): 50 mg via ORAL
  Filled 2014-11-21 (×7): qty 1

## 2014-11-21 MED ORDER — LOSARTAN POTASSIUM 50 MG PO TABS
100.0000 mg | ORAL_TABLET | Freq: Every day | ORAL | Status: DC
  Administered 2014-11-21 – 2014-11-24 (×4): 100 mg via ORAL
  Filled 2014-11-21 (×5): qty 2

## 2014-11-21 MED ORDER — SERTRALINE HCL 50 MG PO TABS
50.0000 mg | ORAL_TABLET | Freq: Every day | ORAL | Status: DC
  Administered 2014-11-23: 50 mg via ORAL
  Filled 2014-11-21 (×4): qty 1

## 2014-11-21 MED ORDER — METOPROLOL SUCCINATE ER 50 MG PO TB24
50.0000 mg | ORAL_TABLET | Freq: Every day | ORAL | Status: DC
  Administered 2014-11-21 – 2014-11-24 (×4): 50 mg via ORAL
  Filled 2014-11-21 (×4): qty 1

## 2014-11-21 MED ORDER — ONDANSETRON HCL 4 MG/2ML IJ SOLN: 4.0000 mg | Freq: Four times a day (QID) | INTRAMUSCULAR | Status: DC | PRN

## 2014-11-21 MED ORDER — HYDROCODONE-ACETAMINOPHEN 5-325 MG PO TABS
1.0000 | ORAL_TABLET | ORAL | Status: DC | PRN
  Administered 2014-11-22: 2 via ORAL
  Administered 2014-11-22: 1 via ORAL
  Administered 2014-11-23 – 2014-11-27 (×11): 2 via ORAL
  Filled 2014-11-21 (×9): qty 2
  Filled 2014-11-21: qty 1
  Filled 2014-11-21 (×3): qty 2

## 2014-11-21 MED ORDER — SODIUM CHLORIDE 0.9 % IV BOLUS (SEPSIS)
1000.0000 mL | Freq: Once | INTRAVENOUS | Status: AC
  Administered 2014-11-21: 1000 mL via INTRAVENOUS

## 2014-11-21 MED ORDER — SODIUM CHLORIDE 0.9 % IJ SOLN
3.0000 mL | Freq: Two times a day (BID) | INTRAMUSCULAR | Status: DC
  Administered 2014-11-21: 3 mL via INTRAVENOUS

## 2014-11-21 NOTE — Progress Notes (Signed)
ANTICOAGULATION CONSULT NOTE - Initial Consult  Pharmacy Consult for enoxaparin Indication: pulmonary embolus  No Known Allergies  Patient Measurements:   Heparin Dosing Weight:   Vital Signs: Temp: 98.1 F (36.7 C) (03/18 0643) Temp Source: Oral (03/18 0643) BP: 193/103 mmHg (03/18 1144) Pulse Rate: 76 (03/18 1144)  Labs:  Recent Labs  11/20/14 1718 11/21/14 0701  HGB 14.5 14.8  HCT 43.7 44.6  PLT 181.0 189  LABPROT  --  33.1*  INR  --  3.21*  CREATININE 1.28* 1.32*    Estimated Creatinine Clearance: 33.9 mL/min (by C-G formula based on Cr of 1.32).   Medical History: Past Medical History  Diagnosis Date  . PAF (paroxysmal atrial fibrillation) 2000  . LVH (left ventricular hypertrophy)   . Subarachnoid hemorrhage 2000  . Hyperlipidemia   . HTN (hypertension)     Essential  . Chronic anticoagulation 06/2014    Coumadin started after bilateral PE  . PE (pulmonary embolism) 06/2014    Bilateral  . CHF (congestive heart failure)   . Heart murmur    Assessment: 55 YOF presents with chest pain and CTA reveals acute PE.  She has history of BL PE Oct 2015.  Also has h/o SAH in 2000.  On warfarin prior to admission with INR = 3.21. Hematology has recommended stop warfarin and start enoxaparin  Today, 11/21/2014  CBC: Hgb and pltc WNL  Renal: Scr =1.32 for CrCl = 10ml/min  INR = 3.21  Goal of Therapy:  Anti-Xa level 0.6-1 units/ml 4hrs after LMWH dose given Monitor platelets by anticoagulation protocol: Yes   Plan:   Spoke with Dr. Aileen Fass regarding INR > 3 ([facility]), requests to start enoxaparin when INR < 3  Daily INR  Check CBC at least q72h while on LMWH in hospital  Monitor Scr and CrCl for most appropriate enoxaparin dose  Doreene Eland, PharmD, BCPS.   Pager: [phone] 11/21/2014,1:07 PM

## 2014-11-21 NOTE — ED Provider Notes (Signed)
CSN: [id number]     Arrival date & time 11/21/14  3329 History   None    Chief Complaint  Patient presents with  . Chest Pain     (Consider location/radiation/quality/duration/timing/severity/associated sxs/prior Treatment) The history is provided by the patient and a significant other. No language interpreter was used.   Monique [name] is a 79 y.o black female with a history of a heart cath, CHF, and PE who presents with intermittent dull chest pain for the past 3 weeks.  She says she was awoken at 4am by a dull heaviness in her chest that has been constant since.  She took a nitroglycerin for pain but her pain is currently a 9/10. She was put on an event monitor at home for the past few weeks by her cardiologist, Dr. Harrington Challenger, due to palpitations. She also had a stress test done 2 weeks ago which was normal. She was seen by her PCP yesterday who put her on anxiety medication.  She says she took her first pill last night which helped her sleep but she is still feeling nervous. Nothing makes it better or worse. She is currently taking coumadin.  She denies any shortness of breath, abdominal pain, or increased swelling in her legs. Past Medical History  Diagnosis Date  . PAF (paroxysmal atrial fibrillation) 2000  . LVH (left ventricular hypertrophy)   . Subarachnoid hemorrhage 2000  . Hyperlipidemia   . HTN (hypertension)     Essential  . Chronic anticoagulation 06/2014    Coumadin started after bilateral PE  . PE (pulmonary embolism) 06/2014    Bilateral  . CHF (congestive heart failure)   . Heart murmur    Past Surgical History  Procedure Laterality Date  . Abdominal hysterectomy    . Cardiac catheterization  05/2008    Nl cors, no gradient, EF 60%   Family History  Problem Relation Age of Onset  . Cancer Mother   . Stroke Mother   . Arrhythmia Mother   . Heart disease Mother   . Arrhythmia Sister   . Arrhythmia Sister    History  Substance Use Topics  . Smoking status: Former  Smoker    Quit date: 09/06/1967  . Smokeless tobacco: Never Used  . Alcohol Use: No   OB History    No data available     Review of Systems  Constitutional: Negative for fever.  Gastrointestinal: Negative for nausea and vomiting.  Neurological: Negative for syncope and headaches.  All other systems reviewed and are negative.     Allergies  Review of patient's allergies indicates no known allergies.  Home Medications   Prior to Admission medications   Medication Sig Start Date End Date Taking? Authorizing Provider  disopyramide (NORPACE) 150 MG capsule Take 150 mg by mouth 2 (two) times daily.  07/24/14  Yes Historical Provider, MD  fluticasone (FLONASE) 50 MCG/ACT nasal spray Place 2 sprays into both nostrils daily. 11/05/14  Yes Olga Millers, MD  losartan (COZAAR) 100 MG tablet Take 1 tablet (100 mg total) by mouth daily. 11/20/14  Yes Hendricks Limes, MD  metoprolol succinate (TOPROL-XL) 50 MG 24 hr tablet Take 50 mg by mouth daily.  07/26/14  Yes Historical Provider, MD  nitroGLYCERIN (NITROSTAT) 0.4 MG SL tablet Place 1 tablet (0.4 mg total) under the tongue every 5 (five) minutes as needed for chest pain. 10/30/14  Yes Brett Canales, PA-C  sertraline (ZOLOFT) 50 MG tablet Take 1 tablet (50 mg total) by mouth  daily. 11/20/14  Yes Hendricks Limes, MD  simvastatin (ZOCOR) 40 MG tablet TAKE 1 TABLET (40 MG TOTAL) BY MOUTH AT BEDTIME. 09/23/14  Yes Fay Records, MD  traMADol (ULTRAM) 50 MG tablet Take 1 tablet (50 mg total) by mouth every 12 (twelve) hours as needed. Patient taking differently: Take 50 mg by mouth every 12 (twelve) hours as needed for moderate pain.  11/05/14  Yes Olga Millers, MD  warfarin (COUMADIN) 5 MG tablet Take daily as instructed. Patient taking differently: Take 5 mg by mouth daily. Take 1 tablet by mouth daily at 6pm 09/24/14  Yes Olga Millers, MD   BP 193/103 mmHg  Pulse 76  Temp(Src) 98.1 F (36.7 C) (Oral)  Resp 16  SpO2 96% Physical  Exam  Constitutional: She is oriented to person, place, and time. She appears well-developed and well-nourished.  HENT:  Head: Normocephalic and atraumatic.  Mouth/Throat: Oropharynx is clear and moist.  Eyes: Conjunctivae and EOM are normal.  Neck: Normal range of motion. Neck supple.  Cardiovascular: Normal rate, regular rhythm and normal heart sounds.   Pulmonary/Chest: Effort normal and breath sounds normal. No respiratory distress. She has no wheezes. She has no rales. She exhibits no tenderness.  Abdominal: Soft. There is no tenderness.  Musculoskeletal: Normal range of motion.  Neurological: She is alert and oriented to person, place, and time.  Skin: Skin is warm and dry.  Nursing note and vitals reviewed.   ED Course  Procedures (including critical care time) Labs Review Labs Reviewed  CBC - Abnormal; Notable for the following:    WBC 11.6 (*)    All other components within normal limits  BASIC METABOLIC PANEL - Abnormal; Notable for the following:    Glucose, Bld 162 (*)    Creatinine, Ser 1.32 (*)    GFR calc non Af Amer 38 (*)    GFR calc Af Amer 44 (*)    All other components within normal limits  PROTIME-INR - Abnormal; Notable for the following:    Prothrombin Time 33.1 (*)    INR 3.21 (*)    All other components within normal limits  BRAIN NATRIURETIC PEPTIDE  I-STAT TROPOININ, ED    Imaging Review Ct Angio Chest Pe W/cm &/or Wo Cm  11/21/2014   ADDENDUM REPORT: 11/21/2014 10:10  ADDENDUM: Critical Value/emergent results were called by telephone at the time of interpretation on 11/21/2014 at 10:10 am to Dr. Johnney Killian, ED physician, who verbally acknowledged these results.   Electronically Signed   By: Lowella Grip III M.D.   On: 11/21/2014 10:10   11/21/2014   Electronically Signed: By: Lowella Grip III M.D. On: 11/21/2014 10:06   Dg Chest Port 1 View  11/21/2014   CLINICAL DATA:  Chest pain  EXAM: PORTABLE CHEST - 1 VIEW  COMPARISON:  06/27/2014   FINDINGS: Stable cardiomegaly and aortic tortuosity. The hila are negative. Pulmonary hyperinflation, suspected COPD. There is no edema, consolidation, effusion, or pneumothorax.  IMPRESSION: 1. Chronic cardiomegaly and probable COPD. 2. No acute findings.   Electronically Signed   By: Monte Fantasia M.D.   On: 11/21/2014 07:41     EKG Interpretation   Date/Time:  Friday November 21 2014 06:44:06 EDT Ventricular Rate:  62 PR Interval:  182 QRS Duration: 93 QT Interval:  453 QTC Calculation: 460 R Axis:   -47 Text Interpretation:  Sinus rhythm Left anterior fascicular block RSR' in  V1 or V2, right VCD or RVH LVH with secondary  repolarization abnormality  Confirmed by Hazle Coca [phone]) on 11/21/2014 6:54:40 AM      MDM   Final diagnoses:  Other chest pain  Pulmonary embolus  Patient presents for intermittent dull chest pain for the past few weeks but was awaken at 4am with chest pain that was not relieved by nitroglycerin.  She was seen by her PCP yesterday and put on anxiety medication. She was put on an event monitor a couple of weeks ago by Dr. Harrington Challenger, her cardiologist, for palpitations.  Her physical exam is normal.  Her vitals are stable.  I have ordered a cardiac workup.  She had an ECHO done in October 2015 that shows severe LVH, moderately severe RV dysfunction, and normal EF. She had a stress test on 3/416 which showed normal blood flow to the heart.   I spoke to Dr. Johnney Killian who recommended a CT angio to see if there were shower emboli.  10:10 Dr. Jasmine December spoke to Dr. Johnney Killian regarding the CT angio results and there is additional blood clots.   10:30 Dr. Johnney Killian called the hospitalist, Dr. Olevia Bowens, to admit the patient.   Ottie Glazier, PA-C 11/21/14 1201  Charlesetta Shanks, MD 11/22/14 [phone]

## 2014-11-21 NOTE — H&P (Addendum)
[facility] History and Physical  Monique [name] ZOX:[id number] DOB: [birth year] DOA: 11/21/2014  Referring physician: Dr. Fransico Michael PCP: Olga Millers, MD   Chief Complaint: Chest pain  HPI: Monique [name] is a 79 y.o. 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 She relates she went to her primary care doctor referred her to the cardiologist for stress as was done which was negative. Her symptoms continued to progressively get worse so she decided to come to the ED, her INR was therapeutic on the list to the ED her previous INR on 3.2 was 2.3 a  on 11/13/2014. She relates that she's been having daily headaches with progressive shortness of breath. She relates they she can walk to the bathroom without getting short of breath but the last few days she has not been able to walk 10 feet without getting short of breath. She relates no hemoptysis. Her pain as a heaviness that worsens with exertion. No hemoptysis or black stools.  In the ED: INR was 3.2, chest x-ray was clean, 12-lead EKG as below, CT scan of the chest was done in the ED that shows a new left upper lung PE. Review of Systems:  Constitutional:  No weight loss, night sweats, Fevers, chills, fatigue.  HEENT:  No headaches, Difficulty swallowing,Tooth/dental problems,Sore throat,  No sneezing, itching, ear ache, nasal congestion, post nasal drip,  Cardio-vascular:  No chest pain, Orthopnea, PND, swelling in lower extremities, anasarca, dizziness, palpitations  GI:  No heartburn, indigestion, abdominal pain, nausea, vomiting, diarrhea, change in bowel habits, loss of appetite  Resp:   No excess mucus, no productive cough, No non-productive cough, No coughing up  of blood.No change in color of mucus.No wheezing.No chest wall deformity  Skin:  no rash or lesions.  GU:  no dysuria, change in color of urine, no urgency or frequency. No flank pain.  Musculoskeletal:  No joint pain or swelling. No decreased range of motion. No back pain.  Psych:  No change in mood or affect. No depression or anxiety. No memory loss.   Past Medical History  Diagnosis Date  . PAF (paroxysmal atrial fibrillation) 2000  . LVH (left ventricular hypertrophy)   . Subarachnoid hemorrhage 2000  . Hyperlipidemia   . HTN (hypertension)     Essential  . Chronic anticoagulation 06/2014    Coumadin started after bilateral PE  . PE (pulmonary embolism) 06/2014    Bilateral  . CHF (congestive heart failure)   . Heart murmur    Past Surgical History  Procedure Laterality Date  . Abdominal hysterectomy    . Cardiac catheterization  05/2008    Nl cors, no gradient, EF 60%   Social History:  reports that she quit smoking about 47 years ago. She has never used smokeless tobacco. She reports that she does not drink alcohol or use illicit drugs.  No Known Allergies  Family History  Problem Relation Age of Onset  . Cancer Mother   . Stroke Mother   . Arrhythmia Mother   . Heart disease Mother   . Arrhythmia Sister   . Arrhythmia Sister     Prior to Admission medications   Medication Sig Start Date End Date Taking? Authorizing Provider  disopyramide (NORPACE) 150 MG capsule Take 150 mg by mouth 2 (  two) times daily.  07/24/14  Yes Historical Provider, MD  fluticasone (FLONASE) 50 MCG/ACT nasal spray Place 2 sprays into both nostrils daily. 11/05/14  Yes Olga Millers, MD  losartan (COZAAR) 100 MG tablet Take 1 tablet (100 mg total) by mouth daily. 11/20/14  Yes Hendricks Limes, MD  metoprolol succinate (TOPROL-XL) 50 MG 24 hr tablet Take 50 mg by mouth daily.  07/26/14  Yes Historical Provider, MD  nitroGLYCERIN (NITROSTAT) 0.4 MG SL tablet Place 1 tablet (0.4 mg  total) under the tongue every 5 (five) minutes as needed for chest pain. 10/30/14  Yes Brett Canales, PA-C  sertraline (ZOLOFT) 50 MG tablet Take 1 tablet (50 mg total) by mouth daily. 11/20/14  Yes Hendricks Limes, MD  simvastatin (ZOCOR) 40 MG tablet TAKE 1 TABLET (40 MG TOTAL) BY MOUTH AT BEDTIME. 09/23/14  Yes Fay Records, MD  traMADol (ULTRAM) 50 MG tablet Take 1 tablet (50 mg total) by mouth every 12 (twelve) hours as needed. Patient taking differently: Take 50 mg by mouth every 12 (twelve) hours as needed for moderate pain.  11/05/14  Yes Olga Millers, MD  warfarin (COUMADIN) 5 MG tablet Take daily as instructed. Patient taking differently: Take 5 mg by mouth daily. Take 1 tablet by mouth daily at 6pm 09/24/14  Yes Olga Millers, MD   Physical Exam: Filed Vitals:   11/21/14 0643 11/21/14 0911 11/21/14 1144  BP: 184/97 166/85 193/103  Pulse: 64 63 76  Temp: 98.1 F (36.7 C)    TempSrc: Oral    Resp: 15 16 16   SpO2: 100% 98% 96%    Wt Readings from Last 3 Encounters:  11/20/14 68.958 kg (152 lb 0.4 oz)  11/07/14 68.947 kg (152 lb)  11/05/14 70.308 kg (155 lb)    General:  Appears calm and comfortable Eyes: PERRL, normal lids, irises & conjunctiva ENT: grossly normal hearing, lips & tongue Neck: no LAD, masses or thyromegaly Cardiovascular: RRR, no m/r/g. No LE edema. Telemetry: SR, no arrhythmias  Respiratory: CTA bilaterally, no w/r/r.  Abdomen: soft, ntnd Skin: no rash or induration seen on limited exam Musculoskeletal: grossly normal tone BUE/BLE Psychiatric: grossly normal mood and affect, speech fluent and appropriate Neurologic: grossly non-focal.          Labs on Admission:  Basic Metabolic Panel:  Recent Labs Lab 11/21/14 0701  NA 140  K 3.7  CL 106  CO2 26  GLUCOSE 162*  BUN 16  CREATININE 1.32*  CALCIUM 9.4   Liver Function Tests: No results for input(s): AST, ALT, ALKPHOS, BILITOT, PROT, ALBUMIN in the last 168 hours. No results for  input(s): LIPASE, AMYLASE in the last 168 hours. No results for input(s): AMMONIA in the last 168 hours. CBC:  Recent Labs Lab 11/21/14 0701  WBC 11.6*  HGB 14.8  HCT 44.6  MCV 94.3  PLT 189   Cardiac Enzymes: No results for input(s): CKTOTAL, CKMB, CKMBINDEX, TROPONINI in the last 168 hours.  BNP (last 3 results)  Recent Labs  11/21/14 0701  BNP 68.3    ProBNP (last 3 results)  Recent Labs  06/27/14 0141  PROBNP 3231.0*    CBG: No results for input(s): GLUCAP in the last 168 hours.  Radiological Exams on Admission: Ct Angio Chest Pe W/cm &/or Wo Cm  11/21/2014   ADDENDUM REPORT: 11/21/2014 10:10  ADDENDUM: Critical Value/emergent results were called by telephone at the time of interpretation on 11/21/2014 at 10:10 am to Dr. Johnney Killian, ED  physician, who verbally acknowledged these results.   Electronically Signed   By: Lowella Grip III M.D.   On: 11/21/2014 10:10   11/21/2014   Electronically Signed: By: Lowella Grip III M.D. On: 11/21/2014 10:06   Dg Chest Port 1 View  11/21/2014   CLINICAL DATA:  Chest pain  EXAM: PORTABLE CHEST - 1 VIEW  COMPARISON:  06/27/2014  FINDINGS: Stable cardiomegaly and aortic tortuosity. The hila are negative. Pulmonary hyperinflation, suspected COPD. There is no edema, consolidation, effusion, or pneumothorax.  IMPRESSION: 1. Chronic cardiomegaly and probable COPD. 2. No acute findings.   Electronically Signed   By: Monte Fantasia M.D.   On: 11/21/2014 07:41    EKG: Independently reviewed. Sinus rhythm left axis deviation and LVH pattern.  Assessment/Plan Recurrent   Acute pulmonary embolus: - Difficult situation as her INR is therapeutic at 3.2. She has developed a new PE while on therapeutic Coumadin. - I spoken with the hematologist and he recommended to start her on Lovenox and stop the Coumadin. - I appreciate hematology's assistance. [facility] consult pharmacy for Lovenox dosing. - She was no black stools or red stools,  she is not up-to-date on her malignancy screening.  Hyperlipidemia - Continue current medications no changes were made.  Essential hypertension - Continue current home meds no changes were made.  Chronic kidney C stage III: Creatinine seems to be at baseline, it usually ranges from 1.4-1.3.  Leukocytosis: Most likely stress emargination.  Code Status: full (must indicate code status--if unknown or must be presumed, indicate so) DVT Prophylaxis:inr  thx Family Communication: none Disposition Plan: inpatient  Time spent: 80 minutes  Charlynne Cousins [facility] Pager [phone]

## 2014-11-21 NOTE — ED Notes (Signed)
Patient states that she awakened from her sleep with a severe ache to her left chest under the breast. Patient states she experienced shortness of breath also. She took 1 nitro tab and the pain was not relieved.

## 2014-11-21 NOTE — Consult Note (Signed)
[place] CONSULTATION  Patient Care Team: Olga Millers, MD as PCP - General (Internal Medicine) Fay Records, MD (Cardiology)  REASON FOR CONSULT: Deep Venous Thrombosis (DVT)  Consulting Physician: Heath Lark, MD  HISTORY OF PRESENTING ILLNESS:  Monique [name] 79 y.o. female with a prior history of PE/DVT in October 2015, on coumadin, admitted with intermittent  Left sided chest pain for the last 3 weeks, worse on the day of presentation to the ED. She denies any shortness of breath or fever, but she reported chills and intermittent night sweats. Denies lower extremity pain, swelling, tenderness or erythema. Denies pre-syncopal episodes, or hemoptysis.  Had some dizziness and right sided headache. She was on heart monitor till recently due to palpitations. Denies any bleeding issues such as epistaxis, hematemesis, hematuria or hematochezia. Ambulating without difficulty. Denies tobacco use. Patient denies taking hormone replacement therapy. Denies a history of peripartum thromboembolic event. 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 Denies a family history of blood clots or miscarriages. Sedentary lifestyle. Patient states that has never had a hematological evaluation prior to this admission. Never had a bone marrow biopsy. 2-D echo in 06/2014 showed severe LVH, moderately severe RV dysfunction, and normal EF. She had a stress test on 3/416 which showed normal blood flow to the heart.  Her prior data in Oct 2015  Was neg for PT II and [facility] Leiden. Cannot find further information. TA on 3/17 is consistent with PE with right heart strain were performed. We were asked to see the patient in consultation with recommendations.  MEDICAL HISTORY:  Past Medical History  Diagnosis Date  . PAF (paroxysmal atrial fibrillation) 2000  . LVH (left ventricular hypertrophy)   . Subarachnoid hemorrhage 2000  . Hyperlipidemia   . HTN  (hypertension)     Essential  . Chronic anticoagulation 06/2014    Coumadin started after bilateral PE  . PE (pulmonary embolism) 06/2014    Bilateral  . CHF (congestive heart failure)   . Heart murmur     SURGICAL HISTORY: Past Surgical History  Procedure Laterality Date  . Abdominal hysterectomy    . Cardiac catheterization  05/2008    Nl cors, no gradient, EF 60%    SOCIAL HISTORY: History   Social History  . Marital Status: Divorced    Spouse Name: N/A  . Number of Children: N/A  . Years of Education: 12   Occupational History  . Retired [employer]    Social History Main Topics  . Smoking status: Former Smoker    Quit date: 09/06/1967  . Smokeless tobacco: Never Used  . Alcohol Use: No  . Drug Use: No  . Sexual Activity: Not Currently   Other Topics Concern  . Not on file   Social History Narrative      HSG. Married '61 - '88/divorced. 3 sons - '74, '59, '62, 1 daughter '63; 5 grandchildren. Lives alone.     FAMILY HISTORY: Family History  Problem Relation Age of Onset  . Cancer, Breast Mother    Cancer, Breast Daughter   . Stroke Mother   . Arrhythmia Mother   . Heart disease Mother   . Arrhythmia Sister   . Arrhythmia Sister     ALLERGIES:  has No Known Allergies.  MEDICATIONS:  No current facility-administered medications for this encounter.   Current Outpatient Prescriptions  Medication Sig Dispense Refill  . disopyramide (NORPACE) 150 MG capsule Take  150 mg by mouth 2 (two) times daily.     . fluticasone (FLONASE) 50 MCG/ACT nasal spray Place 2 sprays into both nostrils daily. 16 g 6  . losartan (COZAAR) 100 MG tablet Take 1 tablet (100 mg total) by mouth daily. 90 tablet 1  . metoprolol succinate (TOPROL-XL) 50 MG 24 hr tablet Take 50 mg by mouth daily.   3  . nitroGLYCERIN (NITROSTAT) 0.4 MG SL tablet Place 1 tablet (0.4 mg total) under the tongue every 5 (five) minutes as needed for chest pain. 25 tablet 3  . sertraline (ZOLOFT)  50 MG tablet Take 1 tablet (50 mg total) by mouth daily. 30 tablet 2  . simvastatin (ZOCOR) 40 MG tablet TAKE 1 TABLET (40 MG TOTAL) BY MOUTH AT BEDTIME. 30 tablet 3  . traMADol (ULTRAM) 50 MG tablet Take 1 tablet (50 mg total) by mouth every 12 (twelve) hours as needed. (Patient taking differently: Take 50 mg by mouth every 12 (twelve) hours as needed for moderate pain. ) 30 tablet 0  . warfarin (COUMADIN) 5 MG tablet Take daily as instructed. (Patient taking differently: Take 5 mg by mouth daily. Take 1 tablet by mouth daily at 6pm) 30 tablet 3    ROS: Constitutional: Denies fevers, chills or abnormal night sweats Eyes: Denies blurriness of vision, double vision or watery eyes Ears, nose, mouth, throat, and face: Denies mucositis or sore throat Respiratory: Denies cough, dyspnea or wheezes Cardiovascular: Denies palpitation ,positive for chest pain as per HPI, no lower extremity swelling Gastrointestinal:  Denies nausea, heartburn or change in bowel habits Skin: Denies abnormal skin rashes Lymphatics: Denies new lymphadenopathy or easy bruising Neurological: Denies numbness, tingling or new weaknesses Behavioral/Psych: Mood is anxious All other systems were reviewed with the patient and are negative.   Physical Exam    Filed Vitals:   11/21/14 1144  BP: 193/103  Pulse: 76  Temp:   Resp: 16   There were no vitals filed for this visit.  GENERAL:alert, no distress and comfortable SKIN: skin color, texture, turgor are normal, no rashes .Subcutaneous raised non mobile area at the occipital scalp, non tender. Right facial suspicious mole. EYES: arcus senilis noted. conjunctiva are pink and non-injected, sclera clear OROPHARYNX:no exudate, no erythema and lips, buccal mucosa, and tongue normal  NECK: supple, thyroid normal size, non-tender, without nodularity LYMPH: no palpable lymphadenopathy in the cervical, axillary or inguinal LUNGS: clear to auscultation and percussion with  normal breathing effort HEART: regular rate & rhythm and no murmurs and no lower extremity edema ABDOMEN:abdomen soft, non-tender and normal bowel sounds Musculoskeletal:no cyanosis of digits and no clubbing  PSYCH: alert & oriented x 3 with fluent speech NEURO: no focal motor/sensory deficits   LABORATORY DATA:    Recent Labs Lab 11/20/14 1718 11/21/14 0701  WBC 9.1 11.6*  HGB 14.5 14.8  HCT 43.7 44.6  PLT 181.0 189  MCV 93.2 94.3  MCH  --  31.3  MCHC 33.3 33.2  RDW 14.6 13.9  LYMPHSABS 4.4*  --   MONOABS 0.7  --   EOSABS 0.1  --   BASOSABS 0.0  --     Chemistries   Recent Labs Lab 11/20/14 1718 11/21/14 0701  NA 140 140  K 4.0 3.7  CL 105 106  CO2 29 26  GLUCOSE 112* 162*  BUN 17 16  CREATININE 1.28* 1.32*  CALCIUM 9.5 9.4  MG 2.1  --       Recent Labs Lab 11/21/14 0701  INR  3.21*      Radiology Studies:      Ct Angio Chest Pe W/cm &/or Wo Cm  11/21/2014   ADDENDUM REPORT: 11/21/2014 12:24  CLINICAL DATA:  Left-sided chest pain and difficulty breathing. History of prior pulmonary embolus.  EXAM: CT angiogram chest  TECHNIQUE: Multidetector CT images were obtained through the chest following dynamic bolus of nonionic intravenous contrast material. Multiplanar reformats obtained.  CONTRAST:  100 mL Omnipaque 350 nonionic  COMPARISON:  Chest CT angiogram June 27, 2014; chest radiograph June 22, 2014  FINDINGS: There is pulmonary embolus arising from the distal right main pulmonary artery extending into the proximal right intralobar pulmonary artery. There is less pulmonary embolus burden in this area compared to the prior CT. There is a small pulmonary embolus in a peripheral branch of the anterior segment left upper lobe pulmonary artery, apparently new from prior study. No other pulmonary embolus seen. The right ventricle to left ventricle diameter ratio is mildly increased at 1.1, a finding that is indicative of a degree of right heart strain. There is  prominence of the ascending aorta with a maximum transverse diameter of 4.1 x 3.9 cm. No thoracic aortic dissection is seen.  There is underlying emphysematous change. There is a stable 4 mm nodular opacity in the periphery of the superior segment of the right lower lobe. There is a stable 3 mm nodular opacity in the lateral segment right middle lobe. There is mild bibasilar atelectatic change. There is no edema or consolidation.  There is left ventricular hypertrophy. The pericardium is not thickened. There is no appreciable thoracic adenopathy. Thyroid appears normal.  In the visualized upper abdomen, there is an 8 mm probable adenoma in the left adrenal. There are no blastic or lytic bone lesions. There is degenerative change in the thoracic spine.  IMPRESSION: Pulmonary emboli, with central pulmonary embolus on the right. It is difficult to ascertain whether the pulmonary embolus on the right is new versus chronic. There is no surrounding vessel sclerosis or web to suggests chronic change. The tiny pulmonary embolus in the left upper lobe appears new. Positive for acute PE with CT evidence of right heartstrain (RV/LV Ratio = 1.1) consistent with at least submassive (intermediate risk)PE. The presence of right heart strain has been associated with anincreased risk of morbidity and mortality. Consultation with [facility] is recommended.  Stable small pulmonary nodular lesions, felt to be benign. No airspace consolidation.  Prominence in at ascending aorta. Recommend annual imaging followup by CTA or MRA. This recommendation follows 2010 ACCF/AHA/AATS/ACR/ASA/SCA/SCAI/SIR/STS/SVM Guidelines for the Diagnosis and Management of Patients with Thoracic Aortic Disease. Circulation. 2010; 121: O962-X528  Comment: Initial dictation of this report was lost due to computer error. This report was called by phone to the emergency department physician earlier in the day.   Electronically Signed   By:  Lowella Grip III M.D.   On: 11/21/2014 12:24   11/21/2014   ADDENDUM REPORT: 11/21/2014 10:10  ADDENDUM: Critical Value/emergent results were called by telephone at the time of interpretation on 11/21/2014 at 10:10 am to Dr. Johnney Killian, ED physician, who verbally acknowledged these results.   Electronically Signed   By: Lowella Grip III M.D.   On: 11/21/2014 10:10   11/21/2014   Electronically Signed: By: Lowella Grip III M.D. On: 11/21/2014 10:06   Dg Chest Port 1 View  11/21/2014   CLINICAL DATA:  Chest pain  EXAM: PORTABLE CHEST - 1 VIEW  COMPARISON:  06/27/2014  FINDINGS: Stable cardiomegaly and aortic tortuosity. The hila are negative. Pulmonary hyperinflation, suspected COPD. There is no edema, consolidation, effusion, or pneumothorax.  IMPRESSION: 1. Chronic cardiomegaly and probable COPD. 2. No acute findings.   Electronically Signed   By: Monte Fantasia M.D.   On: 11/21/2014 07:41     ASSESSMENT/PLAN:   1. Pulmonary Emboli, recurrent: History of  acute DVT of the proximal profunda and proximal femoral veins  Unclear etiology, as patient was compliant with Coumadin and peridodic PT/INR checks May be Coumadin resistant, unresolved lower extremity clot versus underlying malignancy Obtain hypercoagulable panel before anticoagulation  Obtain lower extremities dopplers rule out clots/recurrence Initiate Lovenox per Pharmacy after CT head performed to rule out recurrent ICH, if negative, recommend anticoagulation with Xarelto upon discharge  Preventive measures such as avoiding hormonal supplement, avoiding cigarette smoking, keeping up-to-date with screening programs for early cancer detection, frequent ambulation for long distance travel and aggressive DVT prophylaxis in all surgical settings is recommended.  Will follow An appointment can be arranged upon discharge if needed  2. Headaches Patient has a history of ICH, raised subcutaneous elevated area, and Hypertension   CT  head to rule out bleeding prior to anticoagulation (last Coumadin last evening)  Other medical issues as per admitting team.  **Disclaimer: This note was dictated with voice recognition software. Similar sounding words can inadvertently be transcribed and this note may contain transcription errors which may not have been corrected upon publication of note.**    WERTMAN,SARA E, PA-C _0 @ 12:29 PM  Addendum: Attending Note  I personally saw the patient, reviewed the chart and examined the patient. The plan of care was discussed with the patient . I agree with the assessment and plan as documented above. Thank you very much for the consultation.  Recurrent PE with H/O DVT: last PE was in Oct 2015. Current CT scan shows prior PE in distal Rt Main Pulm artery extending into prox rt intralobar pulm artery (with evidence of Rt Heart strain) New Left small PE No DVT legs based on U/S Currently on Lovenox. Patient was therapeutic on coumadin and still developed blood clots.  Rec 1. Please consult Vascular surgery/ Pulmonary to determine if the heart strain is significant to need intervention. Patient is symptomatic with tachycardia (palpitations) 2. Lovenox unti D/C (if no interventions planned, can be sent home on Xarelto) 3. Duration of therapy: Life long

## 2014-11-21 NOTE — ED Notes (Addendum)
EKG given to EDP,Rees,MD., for review. 

## 2014-11-21 NOTE — ED Notes (Signed)
MD at bedside. 

## 2014-11-21 NOTE — ED Notes (Signed)
Patient transported to CT 

## 2014-11-22 DIAGNOSIS — R51 Headache: Secondary | ICD-10-CM

## 2014-11-22 DIAGNOSIS — N183 Chronic kidney disease, stage 3 (moderate): Secondary | ICD-10-CM

## 2014-11-22 DIAGNOSIS — Z7901 Long term (current) use of anticoagulants: Secondary | ICD-10-CM

## 2014-11-22 DIAGNOSIS — I2699 Other pulmonary embolism without acute cor pulmonale: Secondary | ICD-10-CM

## 2014-11-22 DIAGNOSIS — Z86718 Personal history of other venous thrombosis and embolism: Secondary | ICD-10-CM

## 2014-11-22 DIAGNOSIS — R0789 Other chest pain: Secondary | ICD-10-CM

## 2014-11-22 LAB — CBC
HCT: 41.7 % (ref 36.0–46.0)
Hemoglobin: 13.7 g/dL (ref 12.0–15.0)
MCH: 31 pg (ref 26.0–34.0)
MCHC: 32.9 g/dL (ref 30.0–36.0)
MCV: 94.3 fL (ref 78.0–100.0)
PLATELETS: 177 10*3/uL (ref 150–400)
RBC: 4.42 MIL/uL (ref 3.87–5.11)
RDW: 14 % (ref 11.5–15.5)
WBC: 7.4 10*3/uL (ref 4.0–10.5)

## 2014-11-22 LAB — PROTIME-INR
INR: 2.68 — AB (ref 0.00–1.49)
PROTHROMBIN TIME: 28.7 s — AB (ref 11.6–15.2)

## 2014-11-22 LAB — BASIC METABOLIC PANEL
Anion gap: 9 (ref 5–15)
BUN: 12 mg/dL (ref 6–23)
CALCIUM: 8.9 mg/dL (ref 8.4–10.5)
CHLORIDE: 106 mmol/L (ref 96–112)
CO2: 26 mmol/L (ref 19–32)
Creatinine, Ser: 1.25 mg/dL — ABNORMAL HIGH (ref 0.50–1.10)
GFR, EST AFRICAN AMERICAN: 47 mL/min — AB (ref >90)
GFR, EST NON AFRICAN AMERICAN: 41 mL/min — AB (ref >90)
GLUCOSE: 85 mg/dL (ref 70–99)
Potassium: 3.6 mmol/L (ref 3.5–5.1)
Sodium: 141 mmol/L (ref 135–145)

## 2014-11-22 MED ORDER — ENOXAPARIN SODIUM 80 MG/0.8ML ~~LOC~~ SOLN
1.0000 mg/kg | Freq: Two times a day (BID) | SUBCUTANEOUS | Status: DC
  Administered 2014-11-22 – 2014-11-25 (×7): 70 mg via SUBCUTANEOUS
  Filled 2014-11-22 (×7): qty 0.8

## 2014-11-22 NOTE — Progress Notes (Signed)
*  PRELIMINARY RESULTS* Vascular Ultrasound Lower extremity venous duplex has been completed.  Preliminary findings: Non-occlusive DVT noted in the right common femoral vein. No other DVT noted in bilateral lower extremities.   Landry Mellow, RDMS, RVT  11/22/2014, 9:00 AM

## 2014-11-22 NOTE — Progress Notes (Signed)
ANTICOAGULATION CONSULT NOTE - follow up  Pharmacy Consult for enoxaparin Indication: pulmonary embolus  No Known Allergies  Patient Measurements: Height: 5\' 6"  (167.6 cm) Weight: 149 lb 14.6 oz (68 kg) IBW/kg (Calculated) : 59.3 Heparin Dosing Weight:   Vital Signs: Temp: 97.9 F (36.6 C) (03/19 0430) Temp Source: Oral (03/19 0430) BP: 131/74 mmHg (03/19 0430) Pulse Rate: 65 (03/19 0430)  Labs:  Recent Labs  11/20/14 1718 11/21/14 0701 11/22/14 0530  HGB 14.5 14.8 13.7  HCT 43.7 44.6 41.7  PLT 181.0 189 177  LABPROT  --  33.1* 28.7*  INR  --  3.21* 2.68*  CREATININE 1.28* 1.32*  --     Estimated Creatinine Clearance: 33.9 mL/min (by C-G formula based on Cr of 1.32).   Medical History: Past Medical History  Diagnosis Date  . PAF (paroxysmal atrial fibrillation) 2000  . LVH (left ventricular hypertrophy)   . Subarachnoid hemorrhage 2000  . Hyperlipidemia   . HTN (hypertension)     Essential  . Chronic anticoagulation 06/2014    Coumadin started after bilateral PE  . PE (pulmonary embolism) 06/2014    Bilateral  . CHF (congestive heart failure)   . Heart murmur    Assessment: 39 YOF presents with chest pain and CTA reveals acute PE.  She has history of BL PE Oct 2015.  Also has h/o SAH in 2000.  On warfarin prior to admission with INR = 3.21. Hematology has recommended stop warfarin and start enoxaparin.  Significant Events: 3/18 MD requests to start enoxaparin when INR < 3   Today, 11/22/2014  CBC: Hgb and pltc WNL  Renal: Scr =1.32 for CrCl = 46ml/min (baseline 1.3-1.4)  INR = 2.68  Goal of Therapy:  Anti-Xa level 0.6-1 units/ml 4hrs after LMWH dose given Monitor platelets by anticoagulation protocol: Yes   Plan:   Enoxaparin 1mg /kg (70mg ) sq q12h  Check CBC at least q72h while on LMWH in hospital  Monitor Scr and CrCl for most appropriate enoxaparin dose  Dolly Rias RPh 11/22/2014, 7:32 AM Pager [phone]

## 2014-11-22 NOTE — Progress Notes (Signed)
[facility] PROGRESS NOTE Assessment/Plan: Acute pulmonary embolus - Start Lovenox for DVT,  Check hypercoagulable panel. - Check doppler lower ext. - appriciate oncology assistance.  Chronic kidney disease, stage III (moderate) - at baseline.  Hyperlipidemia - cont statins.  Essential hypertension - cont,losartan metoprolol.    Code Status: full Family Communication: none  Disposition Plan: inpatient   Consultants:  Oncology  Procedures:  Lower ext DVT  Antibiotics:  None  HPI/Subjective: No complains  Objective: Filed Vitals:   11/21/14 1723 11/21/14 1816 11/21/14 2116 11/22/14 0430  BP: 161/106 164/82 150/71 131/74  Pulse: 63 67 66 65  Temp:  97.9 F (36.6 C) 98.3 F (36.8 C) 97.9 F (36.6 C)  TempSrc:  Oral Oral Oral  Resp: 16 18 20 20   Height:  5\' 6"  (1.676 m)    Weight:  68 kg (149 lb 14.6 oz)    SpO2: 96% 95% 97% 97%    Intake/Output Summary (Last 24 hours) at 11/22/14 0835 Last data filed at 11/22/14 0420  Gross per 24 hour  Intake      0 ml  Output    625 ml  Net   -625 ml   Filed Weights   11/21/14 1816  Weight: 68 kg (149 lb 14.6 oz)    Exam:  General: Alert, awake, oriented x3, in no acute distress.  HEENT: No bruits, no goiter.  Heart: Regular rate and rhythm.  Lungs: Good air movement, clear Abdomen: Soft, nontender, nondistended, positive bowel sounds.  Neuro: Grossly intact, nonfocal.   Data Reviewed: Basic Metabolic Panel:  Recent Labs Lab 11/20/14 1718 11/21/14 0701 11/22/14 0530  NA 140 140 141  K 4.0 3.7 3.6  CL 105 106 106  CO2 29 26 26   GLUCOSE 112* 162* 85  BUN 17 16 12   CREATININE 1.28* 1.32* 1.25*  CALCIUM 9.5 9.4 8.9  MG 2.1  --   --    Liver Function Tests: No results for input(s): AST, ALT, ALKPHOS, BILITOT, PROT, ALBUMIN in the last 168 hours. No results for input(s): LIPASE, AMYLASE in the last 168 hours. No results for input(s): AMMONIA in the last 168 hours. CBC:  Recent  Labs Lab 11/20/14 1718 11/21/14 0701 11/22/14 0530  WBC 9.1 11.6* 7.4  NEUTROABS 3.9  --   --   HGB 14.5 14.8 13.7  HCT 43.7 44.6 41.7  MCV 93.2 94.3 94.3  PLT 181.0 189 177   Cardiac Enzymes: No results for input(s): CKTOTAL, CKMB, CKMBINDEX, TROPONINI in the last 168 hours. BNP (last 3 results)  Recent Labs  11/21/14 0701  BNP 68.3    ProBNP (last 3 results)  Recent Labs  06/27/14 0141  PROBNP 3231.0*    CBG: No results for input(s): GLUCAP in the last 168 hours.  No results found for this or any previous visit (from the past 240 hour(s)).   Studies: Ct Angio Chest Pe W/cm &/or Wo Cm  11/21/2014   ADDENDUM REPORT: 11/21/2014 12:24  CLINICAL DATA:  Left-sided chest pain and difficulty breathing. History of prior pulmonary embolus.  EXAM: CT angiogram chest  TECHNIQUE: Multidetector CT images were obtained through the chest following dynamic bolus of nonionic intravenous contrast material. Multiplanar reformats obtained.  CONTRAST:  100 mL Omnipaque 350 nonionic  COMPARISON:  Chest CT angiogram June 27, 2014; chest radiograph June 22, 2014  FINDINGS: There is pulmonary embolus arising from the distal right main pulmonary artery extending into the proximal right intralobar pulmonary artery. There is less pulmonary embolus  burden in this area compared to the prior CT. There is a small pulmonary embolus in a peripheral branch of the anterior segment left upper lobe pulmonary artery, apparently new from prior study. No other pulmonary embolus seen. The right ventricle to left ventricle diameter ratio is mildly increased at 1.1, a finding that is indicative of a degree of right heart strain. There is prominence of the ascending aorta with a maximum transverse diameter of 4.1 x 3.9 cm. No thoracic aortic dissection is seen.  There is underlying emphysematous change. There is a stable 4 mm nodular opacity in the periphery of the superior segment of the right lower lobe. There is a  stable 3 mm nodular opacity in the lateral segment right middle lobe. There is mild bibasilar atelectatic change. There is no edema or consolidation.  There is left ventricular hypertrophy. The pericardium is not thickened. There is no appreciable thoracic adenopathy. Thyroid appears normal.  In the visualized upper abdomen, there is an 8 mm probable adenoma in the left adrenal. There are no blastic or lytic bone lesions. There is degenerative change in the thoracic spine.  IMPRESSION: Pulmonary emboli, with central pulmonary embolus on the right. It is difficult to ascertain whether the pulmonary embolus on the right is new versus chronic. There is no surrounding vessel sclerosis or web to suggests chronic change. The tiny pulmonary embolus in the left upper lobe appears new. Positive for acute PE with CT evidence of right heartstrain (RV/LV Ratio = 1.1) consistent with at least submassive (intermediate risk)PE. The presence of right heart strain has been associated with anincreased risk of morbidity and mortality. Consultation with [place] is recommended.  Stable small pulmonary nodular lesions, felt to be benign. No airspace consolidation.  Prominence in at ascending aorta. Recommend annual imaging followup by CTA or MRA. This recommendation follows 2010 ACCF/AHA/AATS/ACR/ASA/SCA/SCAI/SIR/STS/SVM Guidelines for the Diagnosis and Management of Patients with Thoracic Aortic Disease. Circulation. 2010; 121: I696-E952  Comment: Initial dictation of this report was lost due to computer error. This report was called by phone to the emergency department physician earlier in the day.   Electronically Signed   By: Lowella Grip III M.D.   On: 11/21/2014 12:24   11/21/2014   ADDENDUM REPORT: 11/21/2014 10:10  ADDENDUM: Critical Value/emergent results were called by telephone at the time of interpretation on 11/21/2014 at 10:10 am to Dr. Johnney Killian, ED physician, who verbally acknowledged these  results.   Electronically Signed   By: Lowella Grip III M.D.   On: 11/21/2014 10:10   11/21/2014   Electronically Signed: By: Lowella Grip III M.D. On: 11/21/2014 10:06   Dg Chest Port 1 View  11/21/2014   CLINICAL DATA:  Chest pain  EXAM: PORTABLE CHEST - 1 VIEW  COMPARISON:  06/27/2014  FINDINGS: Stable cardiomegaly and aortic tortuosity. The hila are negative. Pulmonary hyperinflation, suspected COPD. There is no edema, consolidation, effusion, or pneumothorax.  IMPRESSION: 1. Chronic cardiomegaly and probable COPD. 2. No acute findings.   Electronically Signed   By: Monte Fantasia M.D.   On: 11/21/2014 07:41    Scheduled Meds: . disopyramide  150 mg Oral BID  . enoxaparin (LOVENOX) injection  1 mg/kg Subcutaneous Q12H  . fluticasone  2 spray Each Nare Daily  . losartan  100 mg Oral Daily  . metoprolol succinate  50 mg Oral Daily  . sertraline  50 mg Oral Daily  . sodium chloride  3 mL Intravenous Q12H  . sodium chloride  3 mL Intravenous Q12H   Continuous Infusions:    Charlynne Cousins  [facility] Pager [phone]. If 7PM-7AM, please contact night-coverage at [email or web address], password [facility] 11/22/2014, 8:35 AM  LOS: 1 day

## 2014-11-23 LAB — CBC
HEMATOCRIT: 41.8 % (ref 36.0–46.0)
HEMOGLOBIN: 13.7 g/dL (ref 12.0–15.0)
MCH: 31.2 pg (ref 26.0–34.0)
MCHC: 32.8 g/dL (ref 30.0–36.0)
MCV: 95.2 fL (ref 78.0–100.0)
Platelets: 185 10*3/uL (ref 150–400)
RBC: 4.39 MIL/uL (ref 3.87–5.11)
RDW: 14.1 % (ref 11.5–15.5)
WBC: 8.3 10*3/uL (ref 4.0–10.5)

## 2014-11-23 LAB — URINALYSIS, ROUTINE W REFLEX MICROSCOPIC
Bilirubin Urine: NEGATIVE
Glucose, UA: NEGATIVE mg/dL
Ketones, ur: NEGATIVE mg/dL
NITRITE: NEGATIVE
Protein, ur: NEGATIVE mg/dL
Specific Gravity, Urine: 1.011 (ref 1.005–1.030)
Urobilinogen, UA: 0.2 mg/dL (ref 0.0–1.0)
pH: 6 (ref 5.0–8.0)

## 2014-11-23 LAB — HOMOCYSTEINE: HOMOCYSTEINE-NORM: 12.7 umol/L (ref 0.0–15.0)

## 2014-11-23 LAB — URINE MICROSCOPIC-ADD ON

## 2014-11-23 NOTE — Progress Notes (Signed)
[facility] PROGRESS NOTE Assessment/Plan: Acute pulmonary embolus - Start Lovenox for DVT,  Check hypercoagulable panel. - doppler lower ext. Positive for DVT. Ct chest showed right heart strain check a 2-d echo. - cont lovenox.  Chronic kidney disease, stage III (moderate) - at baseline.  Hyperlipidemia - cont statins.  Essential hypertension - cont,losartan metoprolol.    Code Status: full Family Communication: none  Disposition Plan: inpatient   Consultants:  Oncology  Procedures:  Lower ext DVT  Antibiotics:  None  HPI/Subjective: Headaches improved.  Objective: Filed Vitals:   11/22/14 0430 11/22/14 1404 11/22/14 2146 11/23/14 0528  BP: 131/74 152/73 129/83 156/73  Pulse: 65 62 67 63  Temp: 97.9 F (36.6 C) 98.2 F (36.8 C) 98.3 F (36.8 C) 98 F (36.7 C)  TempSrc: Oral Oral Oral Oral  Resp: 20 20 20 20   Height:      Weight:      SpO2: 97% 98% 95% 96%    Intake/Output Summary (Last 24 hours) at 11/23/14 1014 Last data filed at 11/23/14 0900  Gross per 24 hour  Intake    360 ml  Output   1150 ml  Net   -790 ml   Filed Weights   11/21/14 1816  Weight: 68 kg (149 lb 14.6 oz)    Exam:  General: Alert, awake, oriented x3, in no acute distress.  HEENT: No bruits, no goiter.  Heart: Regular rate and rhythm.  Lungs: Good air movement, clear Abdomen: Soft, nontender, nondistended, positive bowel sounds.  Neuro: Grossly intact, nonfocal.   Data Reviewed: Basic Metabolic Panel:  Recent Labs Lab 11/20/14 1718 11/21/14 0701 11/22/14 0530  NA 140 140 141  K 4.0 3.7 3.6  CL 105 106 106  CO2 29 26 26   GLUCOSE 112* 162* 85  BUN 17 16 12   CREATININE 1.28* 1.32* 1.25*  CALCIUM 9.5 9.4 8.9  MG 2.1  --   --    Liver Function Tests: No results for input(s): AST, ALT, ALKPHOS, BILITOT, PROT, ALBUMIN in the last 168 hours. No results for input(s): LIPASE, AMYLASE in the last 168 hours. No results for input(s): AMMONIA in the  last 168 hours. CBC:  Recent Labs Lab 11/20/14 1718 11/21/14 0701 11/22/14 0530 11/23/14 0538  WBC 9.1 11.6* 7.4 8.3  NEUTROABS 3.9  --   --   --   HGB 14.5 14.8 13.7 13.7  HCT 43.7 44.6 41.7 41.8  MCV 93.2 94.3 94.3 95.2  PLT 181.0 189 177 185   Cardiac Enzymes: No results for input(s): CKTOTAL, CKMB, CKMBINDEX, TROPONINI in the last 168 hours. BNP (last 3 results)  Recent Labs  11/21/14 0701  BNP 68.3    ProBNP (last 3 results)  Recent Labs  06/27/14 0141  PROBNP 3231.0*    CBG: No results for input(s): GLUCAP in the last 168 hours.  No results found for this or any previous visit (from the past 240 hour(s)).   Studies: No results found.  Scheduled Meds: . disopyramide  150 mg Oral BID  . enoxaparin (LOVENOX) injection  1 mg/kg Subcutaneous Q12H  . fluticasone  2 spray Each Nare Daily  . losartan  100 mg Oral Daily  . metoprolol succinate  50 mg Oral Daily  . sertraline  50 mg Oral Daily   Continuous Infusions:    Charlynne Cousins  [facility] Pager [phone]. If 7PM-7AM, please contact night-coverage at [email or web address], password [facility] 11/23/2014, 10:14 AM  LOS: 2 days

## 2014-11-23 NOTE — Progress Notes (Signed)
UR Completed.  [phone]  

## 2014-11-23 NOTE — Progress Notes (Signed)
SUBJECTIVE: C/O pain left breast and chest wall and headache  OBJECTIVE PHYSICAL EXAMINATION: ECOG PERFORMANCE STATUS: 2 - Symptomatic, <50% confined to bed  Filed Vitals:   11/23/14 0528  BP: 156/73  Pulse: 63  Temp: 98 F (36.7 C)  Resp: 20   Filed Weights   11/21/14 1816  Weight: 149 lb 14.6 oz (68 kg)    GENERAL:alert, no distress and comfortable SKIN: skin color, texture, turgor are normal, no rashes or significant lesions EYES: normal, Conjunctiva are pink and non-injected, sclera clear OROPHARYNX:no exudate, no erythema and lips, buccal mucosa, and tongue normal  NECK: supple, thyroid normal size, non-tender, without nodularity LYMPH:  no palpable lymphadenopathy in the cervical, axillary or inguinal LUNGS: clear to auscultation and percussion with normal breathing effort HEART: regular rate & rhythm and no murmurs and no lower extremity edema ABDOMEN:abdomen soft, non-tender and normal bowel sounds Musculoskeletal:no cyanosis of digits and no clubbing  NEURO: alert & oriented x 3 with fluent speech, no focal motor/sensory deficits  LABORATORY DATA:   Lab Results  Component Value Date   WBC 8.3 11/23/2014   HGB 13.7 11/23/2014   HCT 41.8 11/23/2014   MCV 95.2 11/23/2014   PLT 185 11/23/2014   NEUTROABS 3.9 11/20/2014    ASSESSMENT AND PLAN:  1. Bilateral PE: on Lovenox with plans to send her home on Xarelto. Heart rate slowed down today and no palpitations. 2. Left chest wall pain: Breast exam did not reveal any breast nodules. 3. Rt Heart strain: ECHO being ordered Pulm being consulted 4. Headache: CT head in Feb revealed Stable subdural hygromas in frontal lobes.

## 2014-11-24 DIAGNOSIS — R0600 Dyspnea, unspecified: Secondary | ICD-10-CM

## 2014-11-24 DIAGNOSIS — I951 Orthostatic hypotension: Secondary | ICD-10-CM

## 2014-11-24 LAB — CREATININE, SERUM
Creatinine, Ser: 1.33 mg/dL — ABNORMAL HIGH (ref 0.50–1.10)
GFR calc Af Amer: 44 mL/min — ABNORMAL LOW (ref >90)
GFR calc non Af Amer: 38 mL/min — ABNORMAL LOW (ref >90)

## 2014-11-24 MED ORDER — RIVAROXABAN (XARELTO) VTE STARTER PACK (15 & 20 MG): ORAL_TABLET | ORAL | Status: DC

## 2014-11-24 MED ORDER — SERTRALINE HCL 50 MG PO TABS
50.0000 mg | ORAL_TABLET | Freq: Every day | ORAL | Status: DC
  Administered 2014-11-24 – 2014-11-27 (×4): 50 mg via ORAL
  Filled 2014-11-24 (×4): qty 1

## 2014-11-24 MED ORDER — SODIUM CHLORIDE 0.9 % IV SOLN
INTRAVENOUS | Status: AC
  Administered 2014-11-24 – 2014-11-25 (×3): via INTRAVENOUS

## 2014-11-24 MED ORDER — METOPROLOL SUCCINATE ER 25 MG PO TB24: 25.0000 mg | ORAL_TABLET | Freq: Every day | ORAL | Status: DC

## 2014-11-24 NOTE — Discharge Instructions (Addendum)
Rivaroxaban oral tablets °What is this medicine? °RIVAROXABAN (ri va ROX a ban) is an anticoagulant (blood thinner). It is used to treat blood clots in the lungs or in the veins. It is also used after knee or hip surgeries to prevent blood clots. It is also used to lower the chance of stroke in people with a medical condition called atrial fibrillation. °This medicine may be used for other purposes; ask your health care provider or pharmacist if you have questions. °COMMON BRAND NAME(S): Xarelto, Xarelto Starter Pack °What should I tell my health care provider before I take this medicine? °They need to know if you have any of these conditions: °-bleeding disorders °-bleeding in the brain °-blood in your stools (black or tarry stools) or if you have blood in your vomit °-history of stomach bleeding °-kidney disease °-liver disease °-low blood counts, like low white cell, platelet, or red cell counts °-recent or planned spinal or epidural procedure °-take medicines that treat or prevent blood clots °-an unusual or allergic reaction to rivaroxaban, other medicines, foods, dyes, or preservatives °-pregnant or trying to get pregnant °-breast-feeding °How should I use this medicine? °Take this medicine by mouth with a glass of water. Follow the directions on the prescription label. Take your medicine at regular intervals. Do not take it more often than directed. Do not stop taking except on your doctor's advice. Stopping this medicine may increase your risk of a blot clot. Be sure to refill your prescription before you run out of medicine. °If you are taking this medicine after hip or knee replacement surgery, take it with or without food. If you are taking this medicine for atrial fibrillation, take it with your evening meal. If you are taking this medicine to treat blood clots, take it with food at the same time each day. If you are unable to swallow your tablet, you may crush the tablet and mix it in applesauce. Then,  immediately eat the applesauce. You should eat more food right after you eat the applesauce containing the crushed tablet. °Talk to your pediatrician regarding the use of this medicine in children. Special care may be needed. °Overdosage: If you think you have taken too much of this medicine contact a poison control center or emergency room at once. °NOTE: This medicine is only for you. Do not share this medicine with others. °What if I miss a dose? °If you take your medicine once a day and miss a dose, take the missed dose as soon as you remember. If you take your medicine twice a day and miss a dose, take the missed dose immediately. In this instance, 2 tablets may be taken at the same time. The next day you should take 1 tablet twice a day as directed. °What may interact with this medicine? °-aspirin and aspirin-like medicines °-certain antibiotics like erythromycin, azithromycin, and clarithromycin °-certain medicines for fungal infections like ketoconazole and itraconazole °-certain medicines for irregular heart beat like amiodarone, quinidine, dronedarone °-certain medicines for seizures like carbamazepine, phenytoin °-certain medicines that treat or prevent blood clots like warfarin, enoxaparin, and dalteparin °-conivaptan °-diltiazem °-felodipine °-indinavir °-lopinavir; ritonavir °-NSAIDS, medicines for pain and inflammation, like ibuprofen or naproxen °-ranolazine °-rifampin °-ritonavir °-St. John's wort °-verapamil °This list may not describe all possible interactions. Give your health care provider a list of all the medicines, herbs, non-prescription drugs, or dietary supplements you use. Also tell them if you smoke, drink alcohol, or use illegal drugs. Some items may interact with your medicine. °What   should I watch for while using this medicine? Visit your doctor or health care professional for regular checks on your progress. Your condition will be monitored carefully while you are receiving this  medicine. Notify your doctor or health care professional and seek emergency treatment if you develop breathing problems; changes in vision; chest pain; severe, sudden headache; pain, swelling, warmth in the leg; trouble speaking; sudden numbness or weakness of the face, arm, or leg. These can be signs that your condition has gotten worse. If you are going to have surgery, tell your doctor or health care professional that you are taking this medicine. Tell your health care professional that you use this medicine before you have a spinal or epidural procedure. Sometimes people who take this medicine have bleeding problems around the spine when they have a spinal or epidural procedure. This bleeding is very rare. If you have a spinal or epidural procedure while on this medicine, call your health care professional immediately if you have back pain, numbness or tingling (especially in your legs and feet), muscle weakness, paralysis, or loss of bladder or bowel control. Avoid sports and activities that might cause injury while you are using this medicine. Severe falls or injuries can cause unseen bleeding. Be careful when using sharp tools or knives. Consider using an Copy. Take special care brushing or flossing your teeth. Report any injuries, bruising, or red spots on the skin to your doctor or health care professional. What side effects may I notice from receiving this medicine? Side effects that you should report to your doctor or health care professional as soon as possible: -allergic reactions like skin rash, itching or hives, swelling of the face, lips, or tongue -back pain -redness, blistering, peeling or loosening of the skin, including inside the mouth -signs and symptoms of bleeding such as bloody or black, tarry stools; red or dark-brown urine; spitting up blood or brown material that looks like coffee grounds; red spots on the skin; unusual bruising or bleeding from the eye, gums, or  nose Side effects that usually do not require medical attention (Report these to your doctor or health care professional if they continue or are bothersome.): -dizziness -muscle pain This list may not describe all possible side effects. Call your doctor for medical advice about side effects. You may report side effects to FDA at 1-800-FDA-1088. Where should I keep my medicine? Keep out of the reach of children. Store at room temperature between 15 and 30 degrees C (59 and 86 degrees F). Throw away any unused medicine after the expiration date. NOTE: This sheet is a summary. It may not cover all possible information. If you have questions about this medicine, talk to your doctor, pharmacist, or health care provider.  2015, [employer]vier/Gold Standard. (2013-12-12 18:47:48)   Information on my medicine - XARELTO (rivaroxaban) This medication education was reviewed with me or my healthcare representative as part of my discharge preparation.  The pharmacist that spoke with me during my hospital stay was:  Leeroy Bock, [place]? Xarelto was prescribed to treat blood clots that may have been found in the veins of your legs (deep vein thrombosis) or in your lungs (pulmonary embolism) and to reduce the risk of them occurring again.  What do you need to know about Xarelto? The starting dose is one 15 mg tablet taken TWICE daily with food for the FIRST 21 DAYS then on (enter date)  12/16/13  the dose is changed  to one 20 mg tablet taken ONCE A DAY with your evening meal.  DO NOT stop taking Xarelto without talking to the health care provider who prescribed the medication.  Refill your prescription for 20 mg tablets before you run out.  After discharge, you should have regular check-up appointments with your healthcare provider that is prescribing your Xarelto.  In the future your dose may need to be changed if your kidney function changes by a significant  amount.  What do you do if you miss a dose? If you are taking Xarelto TWICE DAILY and you miss a dose, take it as soon as you remember. You may take two 15 mg tablets (total 30 mg) at the same time then resume your regularly scheduled 15 mg twice daily the next day.  If you are taking Xarelto ONCE DAILY and you miss a dose, take it as soon as you remember on the same day then continue your regularly scheduled once daily regimen the next day. Do not take two doses of Xarelto at the same time.   Important Safety Information Xarelto is a blood thinner medicine that can cause bleeding. You should call your healthcare provider right away if you experience any of the following: ? Bleeding from an injury or your nose that does not stop. ? Unusual colored urine (red or dark brown) or unusual colored stools (red or black). ? Unusual bruising for unknown reasons. ? A serious fall or if you hit your head (even if there is no bleeding).  Some medicines may interact with Xarelto and might increase your risk of bleeding while on Xarelto. To help avoid this, consult your healthcare provider or pharmacist prior to using any new prescription or non-prescription medications, including herbals, vitamins, non-steroidal anti-inflammatory drugs (NSAIDs) and supplements.  This website has more information on Xarelto: [email or web address]     Orthostatic Hypotension Orthostatic hypotension is a sudden drop in blood pressure. It happens when you quickly stand up from a seated or lying position. You may feel dizzy or light-headed. This can last for just a few seconds or for up to a few minutes. It is usually not a serious problem. However, if this happens frequently or gets worse, it can be a sign of something more serious. CAUSES  Different things can cause orthostatic hypotension, including:   Loss of body fluids (dehydration).  Medicines that lower blood pressure.  Sudden changes in posture, such as  standing up quickly after you have been sitting or lying down.  Taking too much of your medicine. SIGNS AND SYMPTOMS   Light-headedness or dizziness.   Fainting or near-fainting.   A fast heart rate.   Weakness.   Feeling tired (fatigue).  DIAGNOSIS  Your health care provider may do several things to help diagnose your condition and identify the cause. These may include:   Taking a medical history and doing a physical exam.  Checking your blood pressure. Your health care provider will check your blood pressure when you are:  Lying down.  Sitting.  Standing.  Using tilt table testing. In this test, you lie down on a table that moves from a lying position to a standing position. You will be strapped onto the table. This test monitors your blood pressure and heart rate when you are in different positions. TREATMENT  Treatment will vary depending on the cause. Possible treatments include:   Changing the dosage of your medicines.  Wearing compression stockings on your lower legs.  Standing up slowly after  sitting or lying down.  Eating more salt.  Eating frequent, small meals.  In some cases, getting IV fluids.  Taking medicine to enhance fluid retention. HOME CARE INSTRUCTIONS  Only take over-the-counter or prescription medicines as directed by your health care provider.  Follow your health care provider's instructions for changing the dosage of your current medicines.  Do not stop or adjust your medicine on your own.  Stand up slowly after sitting or lying down. This allows your body to adjust to the different position.  Wear compression stockings as directed.  Eat extra salt as directed.  Do not add extra salt to your diet unless directed to by your health care provider.  Eat frequent, small meals.  Avoid standing suddenly after eating.  Avoid hot showers or excessive heat as directed by your health care provider.  Keep all follow-up  appointments. SEEK MEDICAL CARE IF:  You continue to feel dizzy or light-headed after standing.  You feel groggy or confused.  You feel cold, clammy, or sick to your stomach (nauseous).  You have blurred vision.  You feel short of breath. SEEK IMMEDIATE MEDICAL CARE IF:   You faint after standing.  You have chest pain.  You have difficulty breathing.   You lose feeling or movement in your arms or legs.   You have slurred speech or difficulty talking, or you are unable to talk.  MAKE SURE YOU:   Understand these instructions.  Will watch your condition.  Will get help right away if you are not doing well or get worse. Document Released: 08/12/2002 Document Revised: 08/27/2013 Document Reviewed: 06/14/2013 [facility] Patient Information 2015 [place], [place]. This information is not intended to replace advice given to you by your health care provider. Make sure you discuss any questions you have with your health care provider.

## 2014-11-24 NOTE — Progress Notes (Addendum)
Patient continues to complain of a headache that is only minimally releived by prn pain medications.  Will continue to monitor patient.  Patient denies any headache this am though.

## 2014-11-24 NOTE — Progress Notes (Signed)
[facility] PROGRESS NOTE  Daquisha Clermont ZOX:[id number] DOB: 07-01-1936 DOA: 11/21/2014 PCP: Olga Millers, MD Brief narrative  Please refer to admission H&P for details, in brief, 79 y.o. 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 She relates she went to her primary care doctor referred her to the cardiologist for stress as was done which was negative. Her symptoms continued to progressively get worse so she decided to come to the ED. INR have been therapeutic as oupt.  No hemoptysis, hematemesis  or black stools. In the ED INR was 3.2, chest x-ray unremarkable. CT scan of the chest was done in the ED that shows a new left upper lung PE. Pt admitted and placed on lovenox. hematologist consulted given new onset PE while therapeutic on coumadin.  Assessment/Plan: Acute PE with rt heart strain As seen on CT angio which is unable to confirm if this is acute vs chronic. Given her presenting symptoms will treat  for acute recurrent PE with failed anticoagulant with coumadin. Placed on sq lovenox. Pending 2D echo to evaluate rt heart strain. -Pending hypercoagulable w/up. doppler LE shows non occlusive DVT in rt common femoral vein. -Hematology recommends switch to xarelto upon discharge.  -duration of anticoagulation to be determined by hematology as outpt  Orthostatic hypotension Noted on 3/21 as pt complaining of dizziness after getting up in the morning  for past past 3-4 weeks. Severe orthostasis noted on exam. Will hold antihypertensives and order IV hydration.  Repeat orthoatasis in am. Follow echo.   Chronic kidney disease, stage III (moderate) - at baseline.  Hyperlipidemia - cont statins.  Essential hypertension - hold  losartan and metoprolol given orthostasis   Code Status: full code Family Communication: none at bedside Disposition Plan: held discharge due to orthostasis. Pending 2D echo   Consultants:  Hematology ( Dr Lindi Adie)  Procedures:  Ct angio chest   Doppler LE  Antibiotics:  none  HPI/Subjective: C/o pain in the back of her neck. aslo reports few weeks of dizziness after getting up from bed. Denies chest pain or SOB  Objective: Filed Vitals:   11/24/14 1537  BP: 89/66  Pulse: 81  Temp:   Resp:     Intake/Output Summary (Last 24 hours) at 11/24/14 1540 Last data filed at 11/24/14 1444  Gross per 24 hour  Intake    360 ml  Output    850 ml  Net   -490 ml   Filed Weights   11/21/14 1816  Weight: 68 kg (149 lb 14.6 oz)    Exam:   General: elderly female in NAD   HEENT: no pallor, moist mucosa  Chest: clear b/l, no added sounds   CVS: NS1&S2, no murmurs  GI: soft, NT, ND, BS+  Musculoskeletal: warm, no edema  CNS: alert and oriented, no neck tenderness or rigidity.  Data Reviewed: Basic Metabolic Panel:  Recent Labs Lab 11/20/14 1718 11/21/14 0701 11/22/14 0530 11/24/14 0520  NA 140 140 141  --   K 4.0 3.7 3.6  --   CL 105 106 106  --   CO2 29 26 26   --   GLUCOSE 112* 162* 85  --   BUN 17 16 12   --   CREATININE 1.28* 1.32* 1.25* 1.33*  CALCIUM 9.5 9.4 8.9  --   MG 2.1  --   --   --  Liver Function Tests: No results for input(s): AST, ALT, ALKPHOS, BILITOT, PROT, ALBUMIN in the last 168 hours. No results for input(s): LIPASE, AMYLASE in the last 168 hours. No results for input(s): AMMONIA in the last 168 hours. CBC:  Recent Labs Lab 11/20/14 1718 11/21/14 0701 11/22/14 0530 11/23/14 0538  WBC 9.1 11.6* 7.4 8.3  NEUTROABS 3.9  --   --   --   HGB 14.5 14.8 13.7 13.7  HCT 43.7 44.6 41.7 41.8  MCV 93.2 94.3 94.3 95.2  PLT 181.0 189 177 185   Cardiac Enzymes: No results for input(s): CKTOTAL, CKMB, CKMBINDEX, TROPONINI in the  last 168 hours. BNP (last 3 results)  Recent Labs  11/21/14 0701  BNP 68.3    ProBNP (last 3 results)  Recent Labs  06/27/14 0141  PROBNP 3231.0*    CBG: No results for input(s): GLUCAP in the last 168 hours.  No results found for this or any previous visit (from the past 240 hour(s)).   Studies: No results found.  Scheduled Meds: . disopyramide  150 mg Oral BID  . enoxaparin (LOVENOX) injection  1 mg/kg Subcutaneous Q12H  . fluticasone  2 spray Each Nare Daily  . losartan  100 mg Oral Daily  . metoprolol succinate  50 mg Oral Daily  . sertraline  50 mg Oral QHS   Continuous Infusions:     Time spent: [place], [place]  [facility] Pager [phone]. If 7PM-7AM, please contact night-coverage at [email or web address], password [facility] 11/24/2014, 3:40 PM  LOS: 3 days

## 2014-11-24 NOTE — Progress Notes (Signed)
  Echocardiogram 2D Echocardiogram has been performed.  Monique [name] 11/24/2014, 1:35 PM

## 2014-11-25 LAB — PROTHROMBIN GENE MUTATION

## 2014-11-25 LAB — PROTEIN C ACTIVITY: PROTEIN C ACTIVITY: 15 % — AB (ref 74–151)

## 2014-11-25 LAB — PTT-LA INCUB MIX: PTT-LA Incub Mix: 47.6 s (ref 0.0–50.0)

## 2014-11-25 LAB — FACTOR 5 LEIDEN

## 2014-11-25 LAB — PROTEIN S, TOTAL: Protein S Ag, Total: 69 % (ref 58–150)

## 2014-11-25 LAB — LUPUS ANTICOAGULANT PANEL
DRVVT: 43.7 s (ref 0.0–55.1)
PTT Lupus Anticoagulant: 60.7 s — ABNORMAL HIGH (ref 0.0–50.0)

## 2014-11-25 LAB — PROTEIN S ACTIVITY: Protein S Activity: 19 % — ABNORMAL LOW (ref 60–145)

## 2014-11-25 LAB — PROTEIN C, TOTAL: Protein C, Total: 54 % — ABNORMAL LOW (ref 70–140)

## 2014-11-25 LAB — PTT-LA MIX: PTT-LA Mix: 44.7 s (ref 0.0–50.0)

## 2014-11-25 MED ORDER — RIVAROXABAN (XARELTO) EDUCATION KIT FOR DVT/PE PATIENTS
PACK | Freq: Once | Status: AC
  Administered 2014-11-26: 11:00:00
  Filled 2014-11-25: qty 1

## 2014-11-25 MED ORDER — ALUM & MAG HYDROXIDE-SIMETH 200-200-20 MG/5ML PO SUSP
15.0000 mL | ORAL | Status: DC | PRN
  Administered 2014-11-25: 15 mL via ORAL
  Filled 2014-11-25: qty 30

## 2014-11-25 MED ORDER — RIVAROXABAN 15 MG PO TABS
15.0000 mg | ORAL_TABLET | Freq: Two times a day (BID) | ORAL | Status: DC
  Administered 2014-11-25 – 2014-11-28 (×6): 15 mg via ORAL
  Filled 2014-11-25 (×8): qty 1

## 2014-11-25 NOTE — Progress Notes (Signed)
[facility] PROGRESS NOTE Brief narrative Please refer to admission H&P for details, in brief, 79 y.o. 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 She relates she went to her primary care doctor referred her to the cardiologist for stress as was done which was negative. Her symptoms continued to progressively get worse so she decided to come to the ED. INR have been therapeutic as oupt. No hemoptysis, hematemesis or black stools. In the ED INR was 3.2, chest x-ray unremarkable. CT scan of the chest was done in the ED that shows a new left upper lung PE. Pt admitted and placed on lovenox. hematologist consulted given new onset PE while therapeutic on coumadin.  Assessment/Plan: Acute pulmonary embolus - Start Lovenox for DVT,  hypercoagulable panel pending. - doppler lower ext. Positive for DVT. Ct chest showed right heart strain. - 2-d echo shows no right heart strain. - Oncology rec change lovenox to xarelto.  New orthostatic hypotension: - On 3.21.2016 complained of orthostatic hypotension, started on IV fluids. - pt still complaining of orthostatic hypotension.  Chronic kidney disease, stage III (moderate) - at baseline.  Hyperlipidemia - cont statins.  Essential hypertension - d/c losartan and metoprolol, might need to be d/c off it and follow up with PCP    Code Status: full Family Communication: none  Disposition Plan: inpatient   Consultants:  Oncology  Procedures:  Lower ext DVT  Antibiotics:  None  HPI/Subjective: Headaches improved.  Objective: Filed Vitals:   11/24/14 1534 11/24/14 1537 11/24/14 2110 11/25/14 0442  BP: 117/77 89/66 144/66   Pulse: 70 81 73   Temp:   97.8 F (36.6 C) 97.9 F  (36.6 C)  TempSrc:   Oral Oral  Resp:   18 18  Height:      Weight:      SpO2:   99% 97%    Intake/Output Summary (Last 24 hours) at 11/25/14 0803 Last data filed at 11/25/14 0700  Gross per 24 hour  Intake   1885 ml  Output    725 ml  Net   1160 ml   Filed Weights   11/21/14 1816  Weight: 68 kg (149 lb 14.6 oz)    Exam:  General: Alert, awake, oriented x3, in no acute distress.  HEENT: No bruits, no goiter.  Heart: Regular rate and rhythm.  Lungs: Good air movement, clear Abdomen: Soft, nontender, nondistended, positive bowel sounds.  Neuro: Grossly intact, nonfocal.   Data Reviewed: Basic Metabolic Panel:  Recent Labs Lab 11/20/14 1718 11/21/14 0701 11/22/14 0530 11/24/14 0520  NA 140 140 141  --   K 4.0 3.7 3.6  --   CL 105 106 106  --   CO2 29 26 26   --   GLUCOSE 112* 162* 85  --   BUN 17 16 12   --   CREATININE 1.28* 1.32* 1.25* 1.33*  CALCIUM 9.5 9.4 8.9  --   MG 2.1  --   --   --    Liver Function Tests: No results for input(s): AST, ALT, ALKPHOS, BILITOT, PROT, ALBUMIN in the last 168 hours. No results for input(s): LIPASE, AMYLASE in the last 168 hours. No results for input(s): AMMONIA in the last 168 hours. CBC:  Recent Labs Lab 11/20/14 1718 11/21/14 0701 11/22/14 0530 11/23/14 0538  WBC 9.1  11.6* 7.4 8.3  NEUTROABS 3.9  --   --   --   HGB 14.5 14.8 13.7 13.7  HCT 43.7 44.6 41.7 41.8  MCV 93.2 94.3 94.3 95.2  PLT 181.0 189 177 185   Cardiac Enzymes: No results for input(s): CKTOTAL, CKMB, CKMBINDEX, TROPONINI in the last 168 hours. BNP (last 3 results)  Recent Labs  11/21/14 0701  BNP 68.3    ProBNP (last 3 results)  Recent Labs  06/27/14 0141  PROBNP 3231.0*    CBG: No results for input(s): GLUCAP in the last 168 hours.  No results found for this or any previous visit (from the past 240 hour(s)).   Studies: No results found.  Scheduled Meds: . disopyramide  150 mg Oral BID  . fluticasone  2 spray Each Nare  Daily  . sertraline  50 mg Oral QHS   Continuous Infusions: . sodium chloride 100 mL/hr at 11/25/14 [place], ABRAHAM  [facility] Pager [phone]. If 7PM-7AM, please contact night-coverage at [email or web address], password [facility] 11/25/2014, 8:03 AM  LOS: 4 days

## 2014-11-25 NOTE — Progress Notes (Signed)
Pt blood pressure 165/73. MD made aware.  Will continue to monitor closely.

## 2014-11-25 NOTE — Progress Notes (Signed)
Assumed care of patient at 23:30.  I agree with previous assessment and will continue to monitor patient.  [place], Nnamdi Dacus Cherie

## 2014-11-25 NOTE — Progress Notes (Addendum)
ANTICOAGULATION CONSULT NOTE - Follow Up Consult  Pharmacy Consult for Lovenox, Xarelto Indication: VTE  No Known Allergies  Patient Measurements: Height: 5\' 6"  (167.6 cm) Weight: 149 lb 14.6 oz (68 kg) IBW/kg (Calculated) : 59.3  Vital Signs: Temp: 97.9 F (36.6 C) (03/22 0442) Temp Source: Oral (03/22 0442) BP: 144/66 mmHg (03/21 2110) Pulse Rate: 73 (03/21 2110)  Labs:  Recent Labs  11/23/14 0538 11/24/14 0520  HGB 13.7  --   HCT 41.8  --   PLT 185  --   CREATININE  --  1.33*    Estimated Creatinine Clearance: 33.7 mL/min (by C-G formula based on Cr of 1.33).   Medications:  Scheduled:  . disopyramide  150 mg Oral BID  . enoxaparin (LOVENOX) injection  1 mg/kg Subcutaneous Q12H  . fluticasone  2 spray Each Nare Daily  . sertraline  50 mg Oral QHS   Assessment: 70 YOF presents with chest pain and CTA reveals acute PE. She has history of BL PE Oct 2015, and also has h/o SAH in 2000. She was on warfarin prior to admission with INR = 3.21. Hematology has recommended stop warfarin and start enoxaparin, with plan for transition to Xarelto at discharge.  Today, 11/25/2014:  CBC: Hgb and pltc WNL (last on 3/20)  Renal: Scr 1.33 (last on 3/21) with CrCl ~ 34 ml/min   Goal of Therapy:  Anti-Xa level 0.6-1 units/ml 4hrs after LMWH dose given Monitor platelets by anticoagulation protocol: Yes   Plan:   Continue Lovenox 70 mg SQ q12h  Follow up renal function, CBC  Follow up long-term anticoagulation plan.  Gretta Arab PharmD, BCPS Pager [phone] 11/25/2014 7:48 AM    Addendum:  Pharmacy consulted to change from Lovenox to Xarelto in anticipation of discharge planning.  Last dose of Lovenox given 3/22 at 0741. Plan:  Start Xarelto 15mg  PO BID with meals x21 days, then 20mg  PO daily with supper.  First dose 3/22 at 1800  BMET and CBC with AM labs.  Pharmacy to provide education prior to discharge.  Gretta Arab PharmD, BCPS Pager  [phone] 11/25/2014 8:14 AM

## 2014-11-26 ENCOUNTER — Encounter (HOSPITAL_COMMUNITY): Payer: Self-pay | Admitting: Radiology

## 2014-11-26 ENCOUNTER — Inpatient Hospital Stay (HOSPITAL_COMMUNITY): Payer: Medicare Other

## 2014-11-26 LAB — BASIC METABOLIC PANEL WITH GFR
Anion gap: 7 (ref 5–15)
BUN: 9 mg/dL (ref 6–23)
CO2: 27 mmol/L (ref 19–32)
Calcium: 8.9 mg/dL (ref 8.4–10.5)
Chloride: 107 mmol/L (ref 96–112)
Creatinine, Ser: 1.13 mg/dL — ABNORMAL HIGH (ref 0.50–1.10)
GFR calc Af Amer: 53 mL/min — ABNORMAL LOW
GFR calc non Af Amer: 46 mL/min — ABNORMAL LOW
Glucose, Bld: 105 mg/dL — ABNORMAL HIGH (ref 70–99)
Potassium: 3.5 mmol/L (ref 3.5–5.1)
Sodium: 141 mmol/L (ref 135–145)

## 2014-11-26 LAB — CBC
HCT: 40.8 % (ref 36.0–46.0)
Hemoglobin: 13.4 g/dL (ref 12.0–15.0)
MCH: 31.2 pg (ref 26.0–34.0)
MCHC: 32.8 g/dL (ref 30.0–36.0)
MCV: 94.9 fL (ref 78.0–100.0)
Platelets: 166 K/uL (ref 150–400)
RBC: 4.3 MIL/uL (ref 3.87–5.11)
RDW: 14.1 % (ref 11.5–15.5)
WBC: 7 K/uL (ref 4.0–10.5)

## 2014-11-26 LAB — CARDIOLIPIN ANTIBODIES, IGG, IGM, IGA
Anticardiolipin IgA: <9 APL U/mL (ref 0–11)
Anticardiolipin IgM: <9 MPL U/mL (ref 0–12)

## 2014-11-26 LAB — BETA-2-GLYCOPROTEIN I ABS, IGG/M/A
Beta-2 Glyco I IgG: <9 GPI IgG units (ref 0–20)
Beta-2-Glycoprotein I IgA: <9 GPI IgA units (ref 0–25)
Beta-2-Glycoprotein I IgM: <9 GPI IgM units (ref 0–32)

## 2014-11-26 MED ORDER — FLUDROCORTISONE ACETATE 0.1 MG PO TABS
0.1000 mg | ORAL_TABLET | Freq: Two times a day (BID) | ORAL | Status: DC
  Administered 2014-11-26 (×2): 0.1 mg via ORAL
  Filled 2014-11-26 (×3): qty 1

## 2014-11-26 NOTE — Progress Notes (Signed)
Patient c/o dull pain/discomfort below left breast. EKG was performed. There is a sizable hematoma on the lateral side of the left breast. PCP was notified.

## 2014-11-26 NOTE — Progress Notes (Signed)
Pt blood pressure 164/96. MD made aware.  Will continue to monitor closely.

## 2014-11-26 NOTE — Progress Notes (Signed)
[facility] PROGRESS NOTE  Monique [name] [id number] DOB: 1936-05-12 DOA: 11/21/2014 PCP: Olga Millers, MD  brief narrative Please refer to admission H&P for details, in brief, 79 y.o. 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 She relates she went to her primary care doctor referred her to the cardiologist for stress as was done which was negative. Her symptoms continued to progressively get worse so she decided to come to the ED. INR have been therapeutic as oupt. No hemoptysis, hematemesis or black stools. In the ED INR was 3.2, chest x-ray unremarkable. CT scan of the chest was done in the ED that shows a new left upper lung PE. Pt admitted and placed on lovenox. hematologist consulted given new onset PE while therapeutic on coumadin.  Assessment/Plan:  Acute PE with rt heart strain As seen on CT angio which is unable to confirm if this is acute vs chronic. Given her presenting symptoms, treating as acute recurrent PE with failed anticoagulant with coumadin. Placed on sq lovenox.  2D echo negative for   rt heart strain. -Pending hypercoagulable w/up. doppler LE shows non occlusive DVT in rt common femoral vein. -Hematology recommends switch to xarelto upon discharge. -duration of anticoagulation to be determined by hematology as outpt  Orthostatic hypotension  pt complaining of dizziness  for past past 3-4 weeks. Severe orthostasis persists. Holding antihypertensives. Ordered TED stockings and low dose florinef.  Headaches Pt reports ongoing occipital headaches, which seems chronic and had negative head CT 1 month back. Now has rt frontal headaches which is new since admission. Will repeat head CT.   Chronic kidney  disease, stage III (moderate) - at baseline.  Hyperlipidemia - cont statins.  Essential hypertension - hold losartan and metoprolol given orthostasis   Code Status: full code Family Communication: none at bedside Disposition Plan: currently inpt. Hospital stay prolonged due to orthostasis   Consultants:  Hematology ( Dr Lindi Adie)  Procedures:  Ct angio chest  Doppler LE  Antibiotics:  none  HPI/Subjective: Still orthostatic this am. C/o rt frontal and occipital headache  Objective: Filed Vitals:   11/26/14 0356  BP: 157/81  Pulse: 79  Temp: 98.7 F (37.1 C)  Resp: 20    Intake/Output Summary (Last 24 hours) at 11/26/14 1321 Last data filed at 11/26/14 7673  Gross per 24 hour  Intake    240 ml  Output   2050 ml  Net  -1810 ml   Filed Weights   11/21/14 1816  Weight: 68 kg (149 lb 14.6 oz)    Exam:   General:  NAD  HEENT:  moist mucosa  Chest: clear b/l, no added sounds  CVS: NS1&S2, no murmurs  GI: soft, NT, ND,   Musculoskeletal: warm, no edema  CNS: alert and oriented, no neck tenderness or rigidity.  Data Reviewed: Basic Metabolic Panel:  Recent Labs Lab 11/20/14 1718 11/21/14 0701 11/22/14 0530 11/24/14 0520 11/26/14 0510  NA 140 140 141  --  141  K 4.0 3.7 3.6  --  3.5  CL 105 106 106  --  107  CO2 29 26 26   --  27  GLUCOSE 112* 162* 85  --  105*  BUN 17 16 12   --  9  CREATININE 1.28* 1.32* 1.25* 1.33* 1.13*  CALCIUM 9.5 9.4 8.9  --  8.9  MG 2.1  --   --   --   --    Liver Function Tests: No results for input(s): AST, ALT, ALKPHOS, BILITOT, PROT, ALBUMIN in the last 168 hours. No results for input(s): LIPASE, AMYLASE in the last 168 hours. No results for input(s): AMMONIA in the last 168 hours. CBC:  Recent Labs Lab 11/20/14 1718 11/21/14 0701 11/22/14 0530 11/23/14 0538 11/26/14 0510  WBC 9.1 11.6* 7.4 8.3 7.0  NEUTROABS 3.9  --   --   --   --   HGB 14.5 14.8 13.7 13.7 13.4  HCT 43.7 44.6 41.7 41.8 40.8   MCV 93.2 94.3 94.3 95.2 94.9  PLT 181.0 189 177 185 166   Cardiac Enzymes: No results for input(s): CKTOTAL, CKMB, CKMBINDEX, TROPONINI in the last 168 hours. BNP (last 3 results)  Recent Labs  11/21/14 0701  BNP 68.3    ProBNP (last 3 results)  Recent Labs  06/27/14 0141  PROBNP 3231.0*    CBG: No results for input(s): GLUCAP in the last 168 hours.  No results found for this or any previous visit (from the past 240 hour(s)).   Studies: No results found.  Scheduled Meds: . disopyramide  150 mg Oral BID  . fludrocortisone  0.1 mg Oral BID  . fluticasone  2 spray Each Nare Daily  . Rivaroxaban  15 mg Oral BID WC  . sertraline  50 mg Oral QHS   Continuous Infusions:     Time spent: 20 minutes    Rahiem Schellinger  [facility] Pager [phone] 7PM-7AM, please contact night-coverage at [email or web address], password [facility] 11/26/2014, 1:21 PM  LOS: 5 days

## 2014-11-26 NOTE — Evaluation (Signed)
Physical Therapy One Time Evaluation Patient Details Name: Monique [name] MRN: [id number] DOB: 04-Jul-1936 Today's Date: 11/26/2014   History of Present Illness  79 y.o. female past medical history paroxysmal atrial fibrillation, subarachnoid hemorrhage in 2000 secondary to 2 millimeter aneurysm,(completely recovered from her stroke), bilateral PE in October 2015 on coumadin, hypertenstion, CHF admitted for acute PE and acute orthostatic hypotension  Clinical Impression  Patient evaluated by Physical Therapy with no further acute PT needs identified. All education has been completed and the patient has no further questions.  See below for any follow-up Physial Therapy or equipment needs. PT is signing off. Thank you for this referral.     Follow Up Recommendations No PT follow up    Equipment Recommendations  None recommended by PT    Recommendations for Other Services       Precautions / Restrictions Precautions Precautions: None      Mobility  Bed Mobility Overal bed mobility: Modified Independent                Transfers Overall transfer level: Modified independent                  Ambulation/Gait Ambulation/Gait assistance: Supervision;Modified independent (Device/Increase time) Ambulation Distance (Feet): 200 Feet Assistive device: None Gait Pattern/deviations: WFL(Within Functional Limits)     General Gait Details: slow pace however steady, WFL, denies dizziness  Stairs            Wheelchair Mobility    Modified Rankin (Stroke Patients Only)       Balance Overall balance assessment: No apparent balance deficits (not formally assessed) (reports no hx of falls)                                           Pertinent Vitals/Pain Pain Assessment: 0-10 Pain Score: 4  Pain Location: headache Pain Descriptors / Indicators: Aching Pain Intervention(s): Limited activity within patient's tolerance;Monitored during  session;Repositioned;Premedicated before session  HR 95 bpm and SpO2 96% room air after ambulation    Home Living Family/patient expects to be discharged to:: Private residence Living Arrangements: Alone Available Help at Discharge: Family Type of Home: House Home Access: Level entry     Home Layout: Able to live on main level with bedroom/bathroom Home Equipment: Cane - single point      Prior Function Level of Independence: Independent with assistive device(s)         Comments: pt states she uses cane since PEs last year      Hand Dominance        Extremity/Trunk Assessment   Upper Extremity Assessment: Overall WFL for tasks assessed           Lower Extremity Assessment: Overall WFL for tasks assessed      Cervical / Trunk Assessment: Normal  Communication   Communication: No difficulties  Cognition Arousal/Alertness: Awake/alert Behavior During Therapy: WFL for tasks assessed/performed Overall Cognitive Status: Within Functional Limits for tasks assessed                      General Comments      Exercises        Assessment/Plan    PT Assessment Patent does not need any further PT services  PT Diagnosis     PT Problem List    PT Treatment Interventions     PT Goals (Current  goals can be found in the Care Plan section) Acute Rehab PT Goals PT Goal Formulation: All assessment and education complete, DC therapy    Frequency     Barriers to discharge        Co-evaluation               End of Session Equipment Utilized During Treatment: Gait belt Activity Tolerance: Patient tolerated treatment well Patient left: in chair;with call bell/phone within reach Nurse Communication: Mobility status         Time: 8119-1478 PT Time Calculation (min) (ACUTE ONLY): 10 min   Charges:   PT Evaluation $Initial PT Evaluation Tier I: 1 Procedure     PT G Codes:        Shonna Deiter,[name] 11/26/2014, 1:27 PM [name], PT,  DPT 11/26/2014 Pager: [phone]

## 2014-11-26 NOTE — Care Management Note (Addendum)
    Page 1 of 1   11/27/2014     1:29:17 PM CARE MANAGEMENT NOTE 11/27/2014  Patient:  ALETA, MANTERNACH   Account Number:  [id number]  Date Initiated:  11/26/2014  Documentation initiated by:  Dessa Phi  Subjective/Objective Assessment:   79 y/o f admitted w/Pulmonary embolism.     Action/Plan:   From home.   Anticipated DC Date:  11/28/2014   Anticipated DC Plan:  [place]  CM consult      Choice offered to / List presented to:             Status of service:  In process, will continue to follow Medicare Important Message given?  YES (If response is "NO", the following Medicare IM given date fields will be blank) Date Medicare IM given:  11/24/2014 Medicare IM given by:  [facility] Date Additional Medicare IM given:  11/27/2014 Additional Medicare IM given by:  [facility]  Discharge Disposition:    Per UR Regulation:  Reviewed for med. necessity/level of care/duration of stay  If discussed at [place] of Stay Meetings, dates discussed:   11/27/2014    Comments:  11/27/14 Dessa Phi RN BSN NCM [phone] Chest pain,cardiac w/u.No anticipated d/c needs.  11/26/14 Dessa Phi RN BSN NCM [phone] No anticipated d/c needs.

## 2014-11-27 DIAGNOSIS — R0789 Other chest pain: Secondary | ICD-10-CM

## 2014-11-27 LAB — TROPONIN I
Troponin I: 0.06 ng/mL — ABNORMAL HIGH (ref <0.031)
Troponin I: 0.06 ng/mL — ABNORMAL HIGH (ref <0.031)
Troponin I: 0.06 ng/mL — ABNORMAL HIGH (ref <0.031)

## 2014-11-27 MED ORDER — METOPROLOL TARTRATE 25 MG PO TABS
12.5000 mg | ORAL_TABLET | Freq: Two times a day (BID) | ORAL | Status: DC
  Administered 2014-11-27 – 2014-11-28 (×3): 12.5 mg via ORAL
  Filled 2014-11-27 (×3): qty 1

## 2014-11-27 MED ORDER — MORPHINE SULFATE 2 MG/ML IJ SOLN
2.0000 mg | Freq: Once | INTRAMUSCULAR | Status: AC
  Administered 2014-11-27: 2 mg via INTRAVENOUS
  Filled 2014-11-27: qty 1

## 2014-11-27 MED ORDER — NITROGLYCERIN 0.4 MG SL SUBL
0.4000 mg | SUBLINGUAL_TABLET | SUBLINGUAL | Status: DC | PRN
  Administered 2014-11-27: 0.4 mg via SUBLINGUAL
  Filled 2014-11-27: qty 1

## 2014-11-27 MED ORDER — MIDODRINE HCL 2.5 MG PO TABS
2.5000 mg | ORAL_TABLET | Freq: Three times a day (TID) | ORAL | Status: DC
  Administered 2014-11-27 – 2014-11-28 (×4): 2.5 mg via ORAL
  Filled 2014-11-27 (×6): qty 1

## 2014-11-27 NOTE — Progress Notes (Addendum)
[facility] PROGRESS NOTE  Monique [name] VEH:[id number] DOB: 23-Dec-1935 DOA: 11/21/2014 PCP: Olga Millers, MD   Brief narrative Please refer to admission H&P for details, in brief, 79 y.o. 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 She relates she went to her primary care doctor referred her to the cardiologist for stress as was done which was negative. Her symptoms continued to progressively get worse so she decided to come to the ED. INR have been therapeutic as oupt. No hemoptysis, hematemesis or black stools. In the ED INR was 3.2, chest x-ray unremarkable. CT scan of the chest was done in the ED that shows a new left upper lung PE. Pt admitted and placed on lovenox. hematologist consulted given new onset PE while therapeutic on coumadin.   Assessment/Plan: Acute PE with rt heart strain As seen on CT angio which is unable to confirm if this is acute vs chronic. Given her presenting symptoms, treating as acute recurrent PE with failed anticoagulant with coumadin. Placed on sq lovenox. 2D echo negative for rt heart strain. -Pending hypercoagulable w/up. doppler LE shows non occlusive DVT in rt common femoral vein. - switced to xarelto per hematology recommendations . -duration of anticoagulation to be determined by hematology as outpt  Orthostatic hypotension pt complaining of dizziness  for past past 3-4 weeks. Severe orthostasis persists. Holding antihypertensives. Ordered TED stockings . Will switch florinef to midodrine.  Chest pain since 3/24  symptoms appear to be atypical and reproducible . EKG shows sinus tachycardia with mild repolarization abnormality. Initial troponin mildly positive.  Will cycle enzymes.  Recent echo unremarkable. Patient also had a Lexiscan Myoview 2 weeks back which was negative for ischemia.  -prn toradol for pain. Place back on tele. -Add back low dose metoprolol given tachycardia. -Cardiac consult if chest pain persist and troponin further elevated.   Headaches Pt reports ongoing occipital headaches, which seems chronic and had negative head CT 1 month back. Now has rt frontal headaches which is new since admission.  repeat head CT unchanged.   Chronic kidney disease, stage III (moderate) - at baseline.  Hyperlipidemia - cont statins.  Essential hypertension - hold losartan  given orthostasis. Resumed metoprolol at low dose.  Left breast hematoma Small superficial hematoma noted. Monitor clinically.    Code Status: full code Family Communication: none at bedside Disposition Plan: currently inpt. Hospital stay prolonged due to orthostasis   Consultants:  Hematology ( Dr Lindi Adie)  Procedures:  Ct angio chest  Doppler LE  Antibiotics:  none  HPI/Subjective: Still orthostatic this am. C/o rt frontal and occipital headache  Objective: Filed Vitals:   11/27/14 1416  BP:   Pulse:   Temp: 98.6 F (37 C)  Resp: 18    Intake/Output Summary (Last 24 hours) at 11/27/14 1424 Last data filed at 11/27/14 1200  Gross per 24 hour  Intake    700 ml  Output    600 ml  Net    100 ml   Filed Weights   11/21/14 1816  Weight: 68 kg (149 lb 14.6 oz)    Exam:   General:  Elderly female in no acute distress  HEENT: Moist oral mucosa  Cardiovascular: S1 and S2 tachycardic, 2/6 systolic murmur  Respiratory: Clear bilaterally, no added sounds  Abdomen: soft , Nondistended, nontender  Musculoskeletal: Warm, no edema  Data Reviewed: Basic Metabolic Panel:  Recent Labs Lab 11/20/14 1718 11/21/14 0701 11/22/14 0530 11/24/14 0520 11/26/14 0510  NA 140 140 141  --  141  K 4.0 3.7 3.6  --  3.5  CL 105 106 106  --  107  CO2 29 26 26   --   27  GLUCOSE 112* 162* 85  --  105*  BUN 17 16 12   --  9  CREATININE 1.28* 1.32* 1.25* 1.33* 1.13*  CALCIUM 9.5 9.4 8.9  --  8.9  MG 2.1  --   --   --   --    Liver Function Tests: No results for input(s): AST, ALT, ALKPHOS, BILITOT, PROT, ALBUMIN in the last 168 hours. No results for input(s): LIPASE, AMYLASE in the last 168 hours. No results for input(s): AMMONIA in the last 168 hours. CBC:  Recent Labs Lab 11/20/14 1718 11/21/14 0701 11/22/14 0530 11/23/14 0538 11/26/14 0510  WBC 9.1 11.6* 7.4 8.3 7.0  NEUTROABS 3.9  --   --   --   --   HGB 14.5 14.8 13.7 13.7 13.4  HCT 43.7 44.6 41.7 41.8 40.8  MCV 93.2 94.3 94.3 95.2 94.9  PLT 181.0 189 177 185 166   Cardiac Enzymes:  Recent Labs Lab 11/27/14 0929  TROPONINI 0.06*   BNP (last 3 results)  Recent Labs  11/21/14 0701  BNP 68.3    ProBNP (last 3 results)  Recent Labs  06/27/14 0141  PROBNP 3231.0*    CBG: No results for input(s): GLUCAP in the last 168 hours.  No results found for this or any previous visit (from the past 240 hour(s)).   Studies: Ct Head Wo Contrast  11/26/2014   CLINICAL DATA:  LEFT occipital headaches since earlier today. History of previous subarachnoid hemorrhage.  EXAM: CT HEAD WITHOUT CONTRAST  TECHNIQUE: Contiguous axial images were obtained from the base of the skull through the vertex without intravenous contrast.  COMPARISON:  Multiple prior CT head studies, most recent 10/17/2014.  FINDINGS: No evidence for acute infarction, hemorrhage, mass lesion, or hydrocephalus. BILATERAL extra-axial fluid collections over the frontal lobes, near water density, consistent with subdural hygromas. These are slightly larger on the LEFT measuring 9 mm LEFT and 7 mm RIGHT. Slight mass effect on the frontal cortex. Minimal LEFT-to-RIGHT shift of 1 mm or so.  No CT signs of large vessel occlusion. Slight hypoattenuation white matter, likely small vessel disease. No basilar subarachnoid hemorrhage. No  layering intraventricular blood. Calvarium intact. No sinus or mastoid disease.  IMPRESSION: Stable chronic changes as described. BILATERAL subdural hygromas without significant improvement or worsening since February.  No acute intracranial findings are evident.   Electronically Signed   By: Rolla Flatten M.D.   On: 11/26/2014 13:54    Scheduled Meds: . disopyramide  150 mg Oral BID  . fluticasone  2 spray Each Nare Daily  . metoprolol tartrate  12.5 mg Oral BID  . midodrine  2.5 mg Oral TID WC  . Rivaroxaban  15 mg Oral BID WC  . sertraline  50 mg Oral QHS   Continuous Infusions:     Time spent: 25 minutes    Jerri Glauser, [place]  [facility] Pager [phone]. If 7PM-7AM, please contact night-coverage at [email or web address], password [facility] 11/27/2014, 2:24 PM  LOS: 6 days

## 2014-11-27 NOTE — Progress Notes (Signed)
Patient c/o pain at left breast and also "skipped beats".  12 lead EKG done. PCP on call was notified.  Patient was given pain medication.

## 2014-11-27 NOTE — Progress Notes (Signed)
RN paged secondary to pt c/o pain under the left breast. RN says "hematoma" at area. 12 lead EKG negative for acute and NP to bedside. S: pt states left breast just started hurting. Fleeting pain. No central sternal pain, jaw pain or arm pain. Doesn't remember hitting her breast or pinching skin, etc. O: Appears well. RRR. Skin: warm, dry. Outer quadrant of left breast with approximately 3x4cm oval shaped purplish, flat area. Pea sized, movable, subq firm area in the proximal portion of the flattened area. Reproducible pain with palpation. No drainage, swelling, active bleeding or heat to area. No rash. A/P: 1. Bruising with small hematoma to left breast-non concerning. Heat to area. Pt is on Xeralto for PE so may have pinched skin or bumped breast without being aware. Will follow. No other tx needed at this point. Reassurance provided. 2. Breast pain-secondary to above. no suspicion for ACS. 12 lead OK. Pain is reproducible.  Clance Boll, NP [facility]

## 2014-11-27 NOTE — Progress Notes (Signed)
Pt statedd pain before nitro was a 6, after 5 minutes pain was a 5.  MD aware.

## 2014-11-28 ENCOUNTER — Encounter (HOSPITAL_COMMUNITY): Payer: Self-pay | Admitting: Emergency Medicine

## 2014-11-28 ENCOUNTER — Emergency Department (HOSPITAL_COMMUNITY): Payer: Medicare Other

## 2014-11-28 ENCOUNTER — Emergency Department (HOSPITAL_COMMUNITY)
Admission: EM | Admit: 2014-11-28 | Discharge: 2014-11-29 | Disposition: A | Discharge status: Home / Self Care | Payer: Medicare Other | Attending: Emergency Medicine | Admitting: Emergency Medicine

## 2014-11-28 DIAGNOSIS — Z87891 Personal history of nicotine dependence: Secondary | ICD-10-CM | POA: Insufficient documentation

## 2014-11-28 DIAGNOSIS — R51 Headache: Secondary | ICD-10-CM | POA: Diagnosis not present

## 2014-11-28 DIAGNOSIS — I82409 Acute embolism and thrombosis of unspecified deep veins of unspecified lower extremity: Secondary | ICD-10-CM

## 2014-11-28 DIAGNOSIS — Z9889 Other specified postprocedural states: Secondary | ICD-10-CM | POA: Insufficient documentation

## 2014-11-28 DIAGNOSIS — I10 Essential (primary) hypertension: Secondary | ICD-10-CM | POA: Insufficient documentation

## 2014-11-28 DIAGNOSIS — I951 Orthostatic hypotension: Secondary | ICD-10-CM

## 2014-11-28 DIAGNOSIS — Z7951 Long term (current) use of inhaled steroids: Secondary | ICD-10-CM | POA: Insufficient documentation

## 2014-11-28 DIAGNOSIS — I480 Paroxysmal atrial fibrillation: Secondary | ICD-10-CM | POA: Insufficient documentation

## 2014-11-28 DIAGNOSIS — R42 Dizziness and giddiness: Secondary | ICD-10-CM

## 2014-11-28 DIAGNOSIS — R7989 Other specified abnormal findings of blood chemistry: Secondary | ICD-10-CM

## 2014-11-28 DIAGNOSIS — R519 Headache, unspecified: Secondary | ICD-10-CM

## 2014-11-28 DIAGNOSIS — E785 Hyperlipidemia, unspecified: Secondary | ICD-10-CM | POA: Insufficient documentation

## 2014-11-28 DIAGNOSIS — R002 Palpitations: Secondary | ICD-10-CM

## 2014-11-28 DIAGNOSIS — I509 Heart failure, unspecified: Secondary | ICD-10-CM | POA: Diagnosis not present

## 2014-11-28 DIAGNOSIS — R079 Chest pain, unspecified: Secondary | ICD-10-CM | POA: Insufficient documentation

## 2014-11-28 DIAGNOSIS — R0789 Other chest pain: Secondary | ICD-10-CM | POA: Diagnosis present

## 2014-11-28 DIAGNOSIS — R011 Cardiac murmur, unspecified: Secondary | ICD-10-CM | POA: Insufficient documentation

## 2014-11-28 DIAGNOSIS — Z79899 Other long term (current) drug therapy: Secondary | ICD-10-CM | POA: Insufficient documentation

## 2014-11-28 DIAGNOSIS — R778 Other specified abnormalities of plasma proteins: Secondary | ICD-10-CM | POA: Diagnosis not present

## 2014-11-28 DIAGNOSIS — Z7901 Long term (current) use of anticoagulants: Secondary | ICD-10-CM | POA: Insufficient documentation

## 2014-11-28 DIAGNOSIS — G43909 Migraine, unspecified, not intractable, without status migrainosus: Secondary | ICD-10-CM | POA: Diagnosis not present

## 2014-11-28 HISTORY — DX: Other specified abnormal findings of blood chemistry: R79.89

## 2014-11-28 HISTORY — DX: Dizziness and giddiness: R42

## 2014-11-28 HISTORY — DX: Orthostatic hypotension: I95.1

## 2014-11-28 HISTORY — DX: Acute embolism and thrombosis of unspecified deep veins of unspecified lower extremity: I82.409

## 2014-11-28 HISTORY — DX: Other specified abnormalities of plasma proteins: R77.8

## 2014-11-28 HISTORY — DX: Palpitations: R00.2

## 2014-11-28 LAB — I-STAT CHEM 8, ED
BUN: 26 mg/dL — ABNORMAL HIGH (ref 6–23)
CHLORIDE: 104 mmol/L (ref 96–112)
CREATININE: 1.4 mg/dL — AB (ref 0.50–1.10)
Calcium, Ion: 1.16 mmol/L (ref 1.13–1.30)
GLUCOSE: 145 mg/dL — AB (ref 70–99)
HEMATOCRIT: 45 % (ref 36.0–46.0)
HEMOGLOBIN: 15.3 g/dL — AB (ref 12.0–15.0)
POTASSIUM: 3.3 mmol/L — AB (ref 3.5–5.1)
SODIUM: 139 mmol/L (ref 135–145)
TCO2: 22 mmol/L (ref 0–100)

## 2014-11-28 MED ORDER — METOCLOPRAMIDE HCL 5 MG/ML IJ SOLN: 10.0000 mg | Freq: Once | INTRAMUSCULAR | Status: DC

## 2014-11-28 MED ORDER — SODIUM CHLORIDE 0.9 % IV BOLUS (SEPSIS)
1000.0000 mL | Freq: Once | INTRAVENOUS | Status: AC
  Administered 2014-11-29: 1000 mL via INTRAVENOUS

## 2014-11-28 MED ORDER — METOCLOPRAMIDE HCL 5 MG/ML IJ SOLN
5.0000 mg | Freq: Once | INTRAMUSCULAR | Status: AC
  Administered 2014-11-29: 5 mg via INTRAVENOUS
  Filled 2014-11-28: qty 2

## 2014-11-28 MED ORDER — METOPROLOL TARTRATE 25 MG PO TABS: 12.5000 mg | ORAL_TABLET | Freq: Two times a day (BID) | ORAL | Status: DC

## 2014-11-28 MED ORDER — POTASSIUM CHLORIDE CRYS ER 20 MEQ PO TBCR
40.0000 meq | EXTENDED_RELEASE_TABLET | Freq: Once | ORAL | Status: AC
  Administered 2014-11-29: 40 meq via ORAL
  Filled 2014-11-28: qty 2

## 2014-11-28 MED ORDER — KETOROLAC TROMETHAMINE 30 MG/ML IJ SOLN
30.0000 mg | Freq: Once | INTRAMUSCULAR | Status: AC
  Administered 2014-11-29: 30 mg via INTRAVENOUS
  Filled 2014-11-28: qty 1

## 2014-11-28 MED ORDER — MIDODRINE HCL 2.5 MG PO TABS: 2.5000 mg | ORAL_TABLET | Freq: Three times a day (TID) | ORAL | Status: DC

## 2014-11-28 NOTE — ED Notes (Signed)
Pt c/o severe migraine, light sensitivity, and sensitive to loud sounds.  Pt denies N/V, but c/o of decreased appetite.  Pt d/c this morning from [facility], patient had been admitted for left lung PE.  Pt denies chest pain and SOB at this time.  Pt had migraines while hospitalized and had one when discharged this morning.  Migraine has gradually gotten worse.  Pt A&Ox4 and is ambulatory.

## 2014-11-28 NOTE — ED Provider Notes (Signed)
CSN: [id number]     Arrival date & time 11/28/14  2250 History   First MD Initiated Contact with Patient 11/28/14 2327     Chief Complaint  Patient presents with  . Migraine     (Consider location/radiation/quality/duration/timing/severity/associated sxs/prior Treatment) HPI  Monique [name] is a 79 y.o. female with past medical history of pulmonary embolism on Xarelto, CHF, hyperlipidemia, subarachnoid hemorrhage, atrial fibrillation presenting today with headache. Patient was recently admitted and discharged from this facility earlier this morning. She is diagnosed with a new plan wasn't started on Xarelto. She has had a right-sided headache that radiates to her occiput for the last 2 weeks however this is worsened today. She is taking Tylenol without any relief. She denies any neurological complaints such as blurry vision, weakness, numbness. Nothing makes the symptoms better or worse. She denies any fevers or recent infections. Patient has no further complaints.   10 Systems reviewed and are negative for acute change except as noted in the HPI.    Past Medical History  Diagnosis Date  . PAF (paroxysmal atrial fibrillation) 2000  . LVH (left ventricular hypertrophy)   . Subarachnoid hemorrhage 2000  . Hyperlipidemia   . HTN (hypertension)     Essential  . Chronic anticoagulation 06/2014    Coumadin started after bilateral PE  . PE (pulmonary embolism) 06/2014    Bilateral  . CHF (congestive heart failure)   . Heart murmur    Past Surgical History  Procedure Laterality Date  . Abdominal hysterectomy    . Cardiac catheterization  05/2008    Nl cors, no gradient, EF 60%   Family History  Problem Relation Age of Onset  . Cancer Mother   . Stroke Mother   . Arrhythmia Mother   . Heart disease Mother   . Arrhythmia Sister   . Arrhythmia Sister    History  Substance Use Topics  . Smoking status: Former Smoker    Quit date: 09/06/1967  . Smokeless tobacco: Never Used  .  Alcohol Use: No   OB History    No data available     Review of Systems    Allergies  Review of patient's allergies indicates no known allergies.  Home Medications   Prior to Admission medications   Medication Sig Start Date End Date Taking? Authorizing Provider  disopyramide (NORPACE) 150 MG capsule Take 150 mg by mouth 2 (two) times daily.  07/24/14  Yes Historical Provider, MD  fluticasone (FLONASE) 50 MCG/ACT nasal spray Place 2 sprays into both nostrils daily. 11/05/14  Yes Olga Millers, MD  metoprolol tartrate (LOPRESSOR) 25 MG tablet Take 0.5 tablets (12.5 mg total) by mouth 2 (two) times daily. 11/28/14  Yes Nishant Dhungel, MD  midodrine (PROAMATINE) 2.5 MG tablet Take 1 tablet (2.5 mg total) by mouth 3 (three) times daily with meals. 11/28/14  Yes Nishant Dhungel, MD  Rivaroxaban (XARELTO STARTER PACK) 15 & 20 MG TBPK Take as directed on package: Start with one 15mg  tablet by mouth twice a day with food. On Day 22, switch to one 20mg  tablet once a day with food. Patient taking differently: Take 15-20 mg by mouth as directed. Dosepak: Take as directed on package: Start with one 15mg  tablet by mouth twice a day with food. On Day 22, switch to one 20mg  tablet once a day with food. 11/24/14  Yes Nishant Dhungel, MD  sertraline (ZOLOFT) 50 MG tablet Take 1 tablet (50 mg total) by mouth daily. 11/20/14  Yes Gwyndolyn Saxon  Chanda Busing, MD  simvastatin (ZOCOR) 40 MG tablet TAKE 1 TABLET (40 MG TOTAL) BY MOUTH AT BEDTIME. 09/23/14  Yes Fay Records, MD  traMADol (ULTRAM) 50 MG tablet Take 1 tablet (50 mg total) by mouth every 12 (twelve) hours as needed. Patient taking differently: Take 50 mg by mouth every 12 (twelve) hours as needed for moderate pain.  11/05/14  Yes Olga Millers, MD  nitroGLYCERIN (NITROSTAT) 0.4 MG SL tablet Place 1 tablet (0.4 mg total) under the tongue every 5 (five) minutes as needed for chest pain. 10/30/14   Brett Canales, PA-C   BP 170/91 mmHg  Pulse 63  Temp(Src)  98.9 F (37.2 C) (Oral)  Resp 18  SpO2 97% Physical Exam  Constitutional: She is oriented to person, place, and time. She appears well-developed and well-nourished. No distress.  HENT:  Head: Normocephalic and atraumatic.  Nose: Nose normal.  Mouth/Throat: Oropharynx is clear and moist. No oropharyngeal exudate.  Eyes: Conjunctivae and EOM are normal. Pupils are equal, round, and reactive to light. No scleral icterus.  Neck: Normal range of motion. Neck supple. No JVD present. No tracheal deviation present. No thyromegaly present.  Cardiovascular: Normal rate, regular rhythm and normal heart sounds.  Exam reveals no gallop and no friction rub.   No murmur heard. Pulmonary/Chest: Effort normal and breath sounds normal. No respiratory distress. She has no wheezes. She exhibits no tenderness.  Abdominal: Soft. Bowel sounds are normal. She exhibits no distension and no mass. There is no tenderness. There is no rebound and no guarding.  Musculoskeletal: Normal range of motion. She exhibits no edema or tenderness.  Lymphadenopathy:    She has no cervical adenopathy.  Neurological: She is alert and oriented to person, place, and time. No cranial nerve deficit. She exhibits normal muscle tone.  Normal strength and sensation 4 extremities, normal cerebellar testing, normal gait.  Skin: Skin is warm and dry. No rash noted. She is not diaphoretic. No erythema. No pallor.  Nursing note and vitals reviewed.   ED Course  Procedures (including critical care time) Labs Review Labs Reviewed  COMPREHENSIVE METABOLIC PANEL - Abnormal; Notable for the following:    Potassium 3.4 (*)    Glucose, Bld 144 (*)    Creatinine, Ser 1.37 (*)    AST 44 (*)    GFR calc non Af Amer 36 (*)    GFR calc Af Amer 42 (*)    All other components within normal limits  PROTIME-INR - Abnormal; Notable for the following:    Prothrombin Time 25.4 (*)    INR 2.28 (*)    All other components within normal limits   I-STAT CHEM 8, ED - Abnormal; Notable for the following:    Potassium 3.3 (*)    BUN 26 (*)    Creatinine, Ser 1.40 (*)    Glucose, Bld 145 (*)    Hemoglobin 15.3 (*)    All other components within normal limits  CBC WITH DIFFERENTIAL/PLATELET  LIPASE, BLOOD  I-STAT TROPOININ, ED    Imaging Review Dg Chest 2 View  11/29/2014   CLINICAL DATA:  Headache and chest pain. Recent diagnosis of pulmonary embolus and hospital discharge earlier this day.  EXAM: CHEST  2 VIEW  COMPARISON:  Radiographs and CT 11/21/2014  FINDINGS: There is unchanged cardiomegaly and tortuosity of the thoracic aorta. Pulmonary vasculature is normal. Mild hyperinflation again seen. No developing consolidation, pleural effusion or pneumothorax. Osseous structures are unchanged.  IMPRESSION: Unchanged cardiomegaly and mild  hyperinflation.   Electronically Signed   By: Jeb Levering M.D.   On: 11/29/2014 00:15   Ct Head Wo Contrast  11/29/2014   CLINICAL DATA:  Headache.  Severe migraine.  EXAM: CT HEAD WITHOUT CONTRAST  TECHNIQUE: Contiguous axial images were obtained from the base of the skull through the vertex without intravenous contrast.  COMPARISON:  Head CT 2 days prior 01/26/2015  FINDINGS: Bilateral subdural hygromas in the frontal regions, 7 mm on the right and 9 mm on the left, unchanged from prior exam. No interval hemorrhage or acute subdural component.  No CT findings of acute ischemia. The ventricles are normal. Mild periventricular white matter change, stable from prior exam. The calvarium is intact. Included paranasal sinuses and mastoid air cells are well aerated.  IMPRESSION: 1.  No acute intracranial abnormality. 2. 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   Electronically Signed   By: Jeb Levering M.D.   On: 11/29/2014 00:00     EKG Interpretation   Date/Time:  Saturday November 29 2014 00:15:36 EDT Ventricular Rate:  63 PR Interval:  181 QRS Duration: 100 QT Interval:  488 QTC  Calculation: 500 R Axis:   -45 Text Interpretation:  Sinus rhythm Left anterior fascicular block  Confirmed by Glynn Octave [phone]) on 11/29/2014 12:55:05 AM      MDM   Final diagnoses:  Headache  Chest pain   patient since emergency department for worsening headache. Patient states she had her headache during her recent admission however they cannot discover the etiology of this. States her headache has worsened, she will require a repeat CT scan of her head as the patient is on Xarelto. Patient was also given Toradol and Reglan emergency department for pain relief. She also admits to mild left-sided chest pain but does not states is any worse then when her pulmonary was was first diagnosed. She is compliant with all medications.   CT scan does not show any bleeding in the brain. Patients headache has improved with medications.  Will DC home with reglan to take as needed.  Given neurology follow up for her headache.  Her vital signs remain within her normal limits and she is safe for DC with neurology fu within 3 days.     Everlene Balls, MD 11/29/14 [phone]

## 2014-11-28 NOTE — Discharge Summary (Signed)
Physician Discharge Summary  Monique [name] GYI:[id number] DOB: Apr 07, 1936 DOA: 11/21/2014  PCP: Olga Millers, MD  Admit date: 11/21/2014 Discharge date: 11/28/2014  Time spent: 35 minutes  Recommendations for Outpatient Follow-up:  1. Discharge home with [facility]  2. Patient should follow-up with her PCP in one week to have her blood pressure monitored. She has been orthostatic and medications adjusted . Symptoms dizziness have resolved on the day of discharge.   Discharge Diagnoses:  Principal Problem:   Acute pulmonary embolism   Active Problems:   Orthostatic hypotension   Hyperlipidemia   Essential hypertension   Paroxysmal Afib   Chronic kidney disease, stage III (moderate)   DVT of lower extremity (deep venous thrombosis)   Elevated troponin   Atypical chest pain   Palpitations   Dizziness and giddiness   Discharge Condition: fair  Diet recommendation: heart healthy  Filed Weights   11/21/14 1816  Weight: 68 kg (149 lb 14.6 oz)    History of present illness:  Please refer to admission H&P for details, in brief, Please refer to admission H&P for details, in brief, 79 y.o. 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 She relates she went to her primary care doctor referred her to the cardiologist for stress as was done which was negative. Her symptoms continued to progressively get worse so she decided to come to the ED. INR have been therapeutic as oupt. No hemoptysis, hematemesis or black stools. In the ED INR was 3.2, chest x-ray unremarkable. CT scan of the chest was done in the ED that shows a new left upper lung PE. Pt admitted and placed on lovenox and then switched to Xarelto. Hospital course prolonged due  to orthostatic hypotension.  Hospital Course:  Acute versus acute on chronic PE As seen on CT angio which is unable to confirm if this is acute vs chronic. Given her presenting symptoms, we decided to treated as acute recurrent PE with failed anticoagulant with coumadin. Placed on sq lovenox. 2D echo negative for rt heart strain which was commented on CT scan. -Hypercoagulable workup unremarkable (has low protein C and S, which can be seen while patient on Coumadin).  -doppler LE shows non occlusive DVT in rt common femoral vein. - switced to xarelto per hematology recommendations . -duration of anticoagulation to be determined by hematology as outpt.    Orthostatic hypotension pt complaining of dizziness for past past 3-4 weeks. Found to have severe orthostasis which persisted for the last few days. Antihypertensives were discontinued. Ordered TED stockings and placed on midodrine with resolution of orthostasis this morning. Subsequent vitals show significant orthostasis but patient has been asymptomatic today and her dizziness has resolved. Patient seen by physical therapy and no further recommendations. -Patient instructed to seek immediate medical care if dizziness persists or worsen, has confusion has blurred vision or syncope. Home care instructions provided.  Chest pain on 3/24 symptoms appear to be atypical and reproducible . EKG shows sinus tachycardia with mild repolarization abnormality. Troponin borderline positive without a peak.  Recent echo unremarkable. Patient also had a Lexiscan Myoview 2 weeks back which was negative for ischemia.  -Stable on telemetry overnight. -No further symptoms.  Headaches Pt reports ongoing occipital headaches, which seems chronic and had negative head CT 1 month back. Head CT repeated which is  unchanged from prior CT and shows stable frontal hygromas. Resume home dose tramadol  Chronic kidney disease, stage III (moderate) - Stable at  baseline.  Hyperlipidemia - Continue statins.  Essential hypertension - Discontinue losartan. Resumed metoprolol at low dose.  Left breast hematoma Small superficial hematoma. Follow-up as outpatient.  History of palpitations Continue Norpace. Has f/up  with cardiology in 2 weeks.  Patient clinically stable to be discharged home. She still has orthostatic drop in her blood pressure but is  asymptomatictoday with resolution of her dizziness. She will follow up with her PCP next week.   Code Status: full code Family Communication: none at bedside Disposition Plan: currently inpt. Hospital stay prolonged due to orthostasis   Consultants:  Hematology ( Dr Lindi Adie)  Procedures:  Ct angio chest  Doppler LE  Antibiotics:  none  Discharge Exam: Filed Vitals:   11/28/14 0504  BP:   Pulse: 72  Temp: 98.2 F (36.8 C)  Resp: 18     General: Elderly female in no acute distress  HEENT: No pallor, Moist oral mucosa, supple neck  Cardiovascular: S1 and S2 tachycardic, 2/6 systolic murmur  Respiratory: Clear bilaterally, no added sounds  Abdomen: soft , Nondistended, nontender, bowel sounds present  Musculoskeletal: Warm, no edema  CNS: Alert and oriented, nonfocal  Discharge Instructions    Current Discharge Medication List    START taking these medications   Details  metoprolol tartrate (LOPRESSOR) 25 MG tablet Take 0.5 tablets (12.5 mg total) by mouth 2 (two) times daily. Qty: 60 tablet, Refills: 0    midodrine (PROAMATINE) 2.5 MG tablet Take 1 tablet (2.5 mg total) by mouth 3 (three) times daily with meals. Qty: 90 tablet, Refills: 0    Rivaroxaban (XARELTO STARTER PACK) 15 & 20 MG TBPK Take as directed on package: Start with one 15mg  tablet by mouth twice a day with food. On Day 22, switch to one 20mg  tablet once a day with food. Qty: 51 each, Refills: 0      CONTINUE these medications which have NOT CHANGED   Details  disopyramide (NORPACE) 150  MG capsule Take 150 mg by mouth 2 (two) times daily.     fluticasone (FLONASE) 50 MCG/ACT nasal spray Place 2 sprays into both nostrils daily. Qty: 16 g, Refills: 6    nitroGLYCERIN (NITROSTAT) 0.4 MG SL tablet Place 1 tablet (0.4 mg total) under the tongue every 5 (five) minutes as needed for chest pain. Qty: 25 tablet, Refills: 3   Associated Diagnoses: Chest pain, unspecified chest pain type    sertraline (ZOLOFT) 50 MG tablet Take 1 tablet (50 mg total) by mouth daily. Qty: 30 tablet, Refills: 2   Associated Diagnoses: Anxiety state    simvastatin (ZOCOR) 40 MG tablet TAKE 1 TABLET (40 MG TOTAL) BY MOUTH AT BEDTIME. Qty: 30 tablet, Refills: 3    traMADol (ULTRAM) 50 MG tablet Take 1 tablet (50 mg total) by mouth every 12 (twelve) hours as needed. Qty: 30 tablet, Refills: 0      STOP taking these medications     losartan (COZAAR) 100 MG tablet      metoprolol succinate (TOPROL-XL) 50 MG 24 hr tablet      warfarin (COUMADIN) 5 MG tablet        No Known Allergies Follow-up Information    Follow up with Olga Millers, MD. Schedule an appointment as soon as possible for a visit in 1 week.   Specialty:  Internal Medicine   Contact information:  [address] [place]  [address] [phone]       Follow up with Rulon Eisenmenger, MD In 8 weeks.   Specialty:  Hematology and Oncology   Contact information:   [place] [address] [phone]        The results of significant diagnostics from this hospitalization (including imaging, microbiology, ancillary and laboratory) are listed below for reference.    Significant Diagnostic Studies: Ct Head Wo Contrast  11/26/2014   CLINICAL DATA:  LEFT occipital headaches since earlier today. History of previous subarachnoid hemorrhage.  EXAM: CT HEAD WITHOUT CONTRAST  TECHNIQUE: Contiguous axial images were obtained from the base of the skull through the vertex without intravenous contrast.   COMPARISON:  Multiple prior CT head studies, most recent 10/17/2014.  FINDINGS: No evidence for acute infarction, hemorrhage, mass lesion, or hydrocephalus. BILATERAL extra-axial fluid collections over the frontal lobes, near water density, consistent with subdural hygromas. These are slightly larger on the LEFT measuring 9 mm LEFT and 7 mm RIGHT. Slight mass effect on the frontal cortex. Minimal LEFT-to-RIGHT shift of 1 mm or so.  No CT signs of large vessel occlusion. Slight hypoattenuation white matter, likely small vessel disease. No basilar subarachnoid hemorrhage. No layering intraventricular blood. Calvarium intact. No sinus or mastoid disease.  IMPRESSION: Stable chronic changes as described. BILATERAL subdural hygromas without significant improvement or worsening since February.  No acute intracranial findings are evident.   Electronically Signed   By: Rolla Flatten M.D.   On: 11/26/2014 13:54   Ct Angio Chest Pe W/cm &/or Wo Cm  11/21/2014   ADDENDUM REPORT: 11/21/2014 12:24  CLINICAL DATA:  Left-sided chest pain and difficulty breathing. History of prior pulmonary embolus.  EXAM: CT angiogram chest  TECHNIQUE: Multidetector CT images were obtained through the chest following dynamic bolus of nonionic intravenous contrast material. Multiplanar reformats obtained.  CONTRAST:  100 mL Omnipaque 350 nonionic  COMPARISON:  Chest CT angiogram June 27, 2014; chest radiograph June 22, 2014  FINDINGS: There is pulmonary embolus arising from the distal right main pulmonary artery extending into the proximal right intralobar pulmonary artery. There is less pulmonary embolus burden in this area compared to the prior CT. There is a small pulmonary embolus in a peripheral branch of the anterior segment left upper lobe pulmonary artery, apparently new from prior study. No other pulmonary embolus seen. The right ventricle to left ventricle diameter ratio is mildly increased at 1.1, a finding that is indicative of  a degree of right heart strain. There is prominence of the ascending aorta with a maximum transverse diameter of 4.1 x 3.9 cm. No thoracic aortic dissection is seen.  There is underlying emphysematous change. There is a stable 4 mm nodular opacity in the periphery of the superior segment of the right lower lobe. There is a stable 3 mm nodular opacity in the lateral segment right middle lobe. There is mild bibasilar atelectatic change. There is no edema or consolidation.  There is left ventricular hypertrophy. The pericardium is not thickened. There is no appreciable thoracic adenopathy. Thyroid appears normal.  In the visualized upper abdomen, there is an 8 mm probable adenoma in the left adrenal. There are no blastic or lytic bone lesions. There is degenerative change in the thoracic spine.  IMPRESSION: Pulmonary emboli, with central pulmonary embolus on the right. It is difficult to ascertain whether the pulmonary embolus on the right is new versus chronic. There is no surrounding vessel sclerosis or web  to suggests chronic change. The tiny pulmonary embolus in the left upper lobe appears new. Positive for acute PE with CT evidence of right heartstrain (RV/LV Ratio = 1.1) consistent with at least submassive (intermediate risk)PE. The presence of right heart strain has been associated with anincreased risk of morbidity and mortality. Consultation with [place] is recommended.  Stable small pulmonary nodular lesions, felt to be benign. No airspace consolidation.  Prominence in at ascending aorta. Recommend annual imaging followup by CTA or MRA. This recommendation follows 2010 ACCF/AHA/AATS/ACR/ASA/SCA/SCAI/SIR/STS/SVM Guidelines for the Diagnosis and Management of Patients with Thoracic Aortic Disease. Circulation. 2010; 121: W967-R916  Comment: Initial dictation of this report was lost due to computer error. This report was called by phone to the emergency department physician earlier in  the day.   Electronically Signed   By: Lowella Grip III M.D.   On: 11/21/2014 12:24   11/21/2014   ADDENDUM REPORT: 11/21/2014 10:10  ADDENDUM: Critical Value/emergent results were called by telephone at the time of interpretation on 11/21/2014 at 10:10 am to Dr. Johnney Killian, ED physician, who verbally acknowledged these results.   Electronically Signed   By: Lowella Grip III M.D.   On: 11/21/2014 10:10   11/21/2014   Electronically Signed: By: Lowella Grip III M.D. On: 11/21/2014 10:06   Dg Chest Port 1 View  11/21/2014   CLINICAL DATA:  Chest pain  EXAM: PORTABLE CHEST - 1 VIEW  COMPARISON:  06/27/2014  FINDINGS: Stable cardiomegaly and aortic tortuosity. The hila are negative. Pulmonary hyperinflation, suspected COPD. There is no edema, consolidation, effusion, or pneumothorax.  IMPRESSION: 1. Chronic cardiomegaly and probable COPD. 2. No acute findings.   Electronically Signed   By: Monte Fantasia M.D.   On: 11/21/2014 07:41    Microbiology: No results found for this or any previous visit (from the past 240 hour(s)).   Labs: Basic Metabolic Panel:  Recent Labs Lab 11/22/14 0530 11/24/14 0520 11/26/14 0510  NA 141  --  141  K 3.6  --  3.5  CL 106  --  107  CO2 26  --  27  GLUCOSE 85  --  105*  BUN 12  --  9  CREATININE 1.25* 1.33* 1.13*  CALCIUM 8.9  --  8.9   Liver Function Tests: No results for input(s): AST, ALT, ALKPHOS, BILITOT, PROT, ALBUMIN in the last 168 hours. No results for input(s): LIPASE, AMYLASE in the last 168 hours. No results for input(s): AMMONIA in the last 168 hours. CBC:  Recent Labs Lab 11/22/14 0530 11/23/14 0538 11/26/14 0510  WBC 7.4 8.3 7.0  HGB 13.7 13.7 13.4  HCT 41.7 41.8 40.8  MCV 94.3 95.2 94.9  PLT 177 185 166   Cardiac Enzymes:  Recent Labs Lab 11/27/14 0929 11/27/14 1547 11/27/14 2121  TROPONINI 0.06* 0.06* 0.06*   BNP: BNP (last 3 results)  Recent Labs  11/21/14 0701  BNP 68.3    ProBNP (last 3  results)  Recent Labs  06/27/14 0141  PROBNP 3231.0*    CBG: No results for input(s): GLUCAP in the last 168 hours.     SignedLouellen Molder  [facility] 11/28/2014, 10:08 AM

## 2014-11-28 NOTE — ED Notes (Signed)
Bed: WA21 Expected date:  Expected time:  Means of arrival:  Comments: Hold for triage 1

## 2014-11-28 NOTE — Progress Notes (Signed)
Pt states,  "that she do not want HHRN."

## 2014-11-28 NOTE — Progress Notes (Signed)
PT Cancellation Note / Screen  Patient Details Name: Monique [name] MRN: [id number] DOB: December 23, 1935   Cancelled Treatment:    Reason Eval/Treat Not Completed: PT screened, no needs identified, will sign off  RN reports no new needs, and pt to d/c home today. Pt seen and evaluated by PT on 3/23, please refer to this note.  THank you.   [name],[name] 11/28/2014, 11:58 AM [name], PT, DPT 11/28/2014 Pager: [phone]

## 2014-11-28 NOTE — Progress Notes (Signed)
Results of orthostatic VS called to Dr. [name].  Proceed with plan to dc per Dr. [name]. Stacey Drain

## 2014-11-29 LAB — COMPREHENSIVE METABOLIC PANEL
ALK PHOS: 65 U/L (ref 39–117)
ALT: 31 U/L (ref 0–35)
AST: 44 U/L — ABNORMAL HIGH (ref 0–37)
Albumin: 3.7 g/dL (ref 3.5–5.2)
Anion gap: 9 (ref 5–15)
BUN: 23 mg/dL (ref 6–23)
CALCIUM: 9.5 mg/dL (ref 8.4–10.5)
CO2: 25 mmol/L (ref 19–32)
Chloride: 104 mmol/L (ref 96–112)
Creatinine, Ser: 1.37 mg/dL — ABNORMAL HIGH (ref 0.50–1.10)
GFR calc non Af Amer: 36 mL/min — ABNORMAL LOW (ref >90)
GFR, EST AFRICAN AMERICAN: 42 mL/min — AB (ref >90)
Glucose, Bld: 144 mg/dL — ABNORMAL HIGH (ref 70–99)
POTASSIUM: 3.4 mmol/L — AB (ref 3.5–5.1)
Sodium: 138 mmol/L (ref 135–145)
Total Bilirubin: 0.8 mg/dL (ref 0.3–1.2)
Total Protein: 6.8 g/dL (ref 6.0–8.3)

## 2014-11-29 LAB — CBC WITH DIFFERENTIAL/PLATELET
BASOS PCT: 0 % (ref 0–1)
Basophils Absolute: 0 10*3/uL (ref 0.0–0.1)
EOS PCT: 1 % (ref 0–5)
Eosinophils Absolute: 0.1 10*3/uL (ref 0.0–0.7)
HCT: 43.2 % (ref 36.0–46.0)
Hemoglobin: 14.3 g/dL (ref 12.0–15.0)
LYMPHS ABS: 3.4 10*3/uL (ref 0.7–4.0)
Lymphocytes Relative: 46 % (ref 12–46)
MCH: 31.6 pg (ref 26.0–34.0)
MCHC: 33.1 g/dL (ref 30.0–36.0)
MCV: 95.6 fL (ref 78.0–100.0)
MONOS PCT: 6 % (ref 3–12)
Monocytes Absolute: 0.4 10*3/uL (ref 0.1–1.0)
NEUTROS ABS: 3.5 10*3/uL (ref 1.7–7.7)
Neutrophils Relative %: 47 % (ref 43–77)
Platelets: 153 10*3/uL (ref 150–400)
RBC: 4.52 MIL/uL (ref 3.87–5.11)
RDW: 14.1 % (ref 11.5–15.5)
WBC: 7.5 10*3/uL (ref 4.0–10.5)

## 2014-11-29 LAB — PROTIME-INR
INR: 2.28 — AB (ref 0.00–1.49)
Prothrombin Time: 25.4 seconds — ABNORMAL HIGH (ref 11.6–15.2)

## 2014-11-29 LAB — I-STAT TROPONIN, ED: Troponin i, poc: 0 ng/mL (ref 0.00–0.08)

## 2014-11-29 LAB — LIPASE, BLOOD: LIPASE: 27 U/L (ref 11–59)

## 2014-11-29 MED ORDER — METOCLOPRAMIDE HCL 10 MG PO TABS: 5.0000 mg | ORAL_TABLET | Freq: Three times a day (TID) | ORAL | Status: DC | PRN

## 2014-11-29 NOTE — Discharge Instructions (Signed)
General Headache Without Cause Ms. Maharaj, Your CT scan did not show any bleeding.  Take medication as needed at home and follow up with neurology within 3 days.  If symptoms worsen, come back to the ED immediately.  Thank you. A general headache is pain or discomfort felt around the head or neck area. The cause may not be found.  HOME CARE   Keep all doctor visits.  Only take medicines as told by your doctor.  Lie down in a dark, quiet room when you have a headache.  Keep a journal to find out if certain things bring on headaches. For example, write down:  What you eat and drink.  How much sleep you get.  Any change to your diet or medicines.  Relax by getting a massage or doing other relaxing activities.  Put ice or heat packs on the head and neck area as told by your doctor.  Lessen stress.  Sit up straight. Do not tighten (tense) your muscles.  Quit smoking if you smoke.  Lessen how much alcohol you drink.  Lessen how much caffeine you drink, or stop drinking caffeine.  Eat and sleep on a regular schedule.  Get 7 to 9 hours of sleep, or as told by your doctor.  Keep lights dim if bright lights bother you or make your headaches worse. GET HELP RIGHT AWAY IF:   Your headache becomes really bad.  You have a fever.  You have a stiff neck.  You have trouble seeing.  Your muscles are weak, or you lose muscle control.  You lose your balance or have trouble walking.  You feel like you will pass out (faint), or you pass out.  You have really bad symptoms that are different than your first symptoms.  You have problems with the medicines given to you by your doctor.  Your medicines do not work.  Your headache feels different than the other headaches.  You feel sick to your stomach (nauseous) or throw up (vomit). MAKE SURE YOU:   Understand these instructions.  Will watch your condition.  Will get help right away if you are not doing well or get  worse. Document Released: 05/31/2008 Document Revised: 11/14/2011 Document Reviewed: 08/12/2011 [facility] Patient Information 2015 [place], [place]. This information is not intended to replace advice given to you by your health care provider. Make sure you discuss any questions you have with your health care provider.

## 2014-11-29 NOTE — ED Notes (Signed)
Notified RN, Jenel Lucks pt. i-stat Chem 8 pt. Potassium 3.3.

## 2014-12-02 ENCOUNTER — Ambulatory Visit (INDEPENDENT_AMBULATORY_CARE_PROVIDER_SITE_OTHER): Payer: Medicare Other | Admitting: Internal Medicine

## 2014-12-02 ENCOUNTER — Encounter: Payer: Self-pay | Admitting: Internal Medicine

## 2014-12-02 VITALS — BP 110/78 | HR 87 | Temp 98.3°F | Resp 12 | Ht 66.0 in | Wt 145.0 lb

## 2014-12-02 DIAGNOSIS — I2699 Other pulmonary embolism without acute cor pulmonale: Secondary | ICD-10-CM

## 2014-12-02 DIAGNOSIS — R519 Headache, unspecified: Secondary | ICD-10-CM

## 2014-12-02 DIAGNOSIS — I951 Orthostatic hypotension: Secondary | ICD-10-CM | POA: Diagnosis not present

## 2014-12-02 DIAGNOSIS — G43711 Chronic migraine without aura, intractable, with status migrainosus: Secondary | ICD-10-CM | POA: Diagnosis not present

## 2014-12-02 DIAGNOSIS — I609 Nontraumatic subarachnoid hemorrhage, unspecified: Secondary | ICD-10-CM | POA: Diagnosis not present

## 2014-12-02 DIAGNOSIS — R51 Headache: Secondary | ICD-10-CM

## 2014-12-02 MED ORDER — MIRTAZAPINE 15 MG PO TABS: 15.0000 mg | ORAL_TABLET | Freq: Every day | ORAL | Status: DC

## 2014-12-02 MED ORDER — MIDODRINE HCL 5 MG PO TABS: 5.0000 mg | ORAL_TABLET | Freq: Three times a day (TID) | ORAL | Status: DC

## 2014-12-02 MED ORDER — MECLIZINE HCL 12.5 MG PO TABS
12.5000 mg | ORAL_TABLET | Freq: Three times a day (TID) | ORAL | Status: DC | PRN
Start: 2014-12-02 — End: 2015-03-30

## 2014-12-02 NOTE — Patient Instructions (Addendum)
We are going to increase the midodrine to 5 mg 3 times a day (you can take 2 pills with meals until you run out), we have sent in a new prescription.   We are going to have you see the neurologist about the headaches to see what they can find out about them.   We can try to start you on a medicine to make you feel a little better and to help the appetite some that you take at bed time called mirtazepine (also called remeron). It is okay to eat anything you want with the new blood medicine. It is also okay to use boost or ensure or carnation breakfast to help with calories and nutrition if you are not that hungry.   You can try the meclizine for the dizziness but I do not think it will help and can make you dizzy. If you decide to try it, please try it in the evening when you will be going to bed soon. If it helps I am okay with you using it.   Come back in about 1 month to see how you are doing.

## 2014-12-02 NOTE — Progress Notes (Signed)
Pre visit review using our clinic review tool, if applicable. No additional management support is needed unless otherwise documented below in the visit note. 

## 2014-12-04 ENCOUNTER — Ambulatory Visit (INDEPENDENT_AMBULATORY_CARE_PROVIDER_SITE_OTHER): Payer: Medicare Other | Admitting: Neurology

## 2014-12-04 ENCOUNTER — Encounter: Payer: Self-pay | Admitting: Neurology

## 2014-12-04 VITALS — BP 144/98 | HR 73 | Ht 66.0 in | Wt 144.5 lb

## 2014-12-04 DIAGNOSIS — I671 Cerebral aneurysm, nonruptured: Secondary | ICD-10-CM | POA: Diagnosis not present

## 2014-12-04 DIAGNOSIS — I773 Arterial fibromuscular dysplasia: Secondary | ICD-10-CM | POA: Diagnosis not present

## 2014-12-04 DIAGNOSIS — R51 Headache: Secondary | ICD-10-CM | POA: Diagnosis not present

## 2014-12-04 DIAGNOSIS — R519 Headache, unspecified: Secondary | ICD-10-CM

## 2014-12-04 LAB — VITAMIN B12: VITAMIN B 12: 469 pg/mL (ref 211–911)

## 2014-12-04 LAB — CREATININE, SERUM: CREATININE: 1.59 mg/dL — AB (ref 0.50–1.10)

## 2014-12-04 NOTE — Patient Instructions (Addendum)
1.  MRI/A brain and MRA neck 2.  Check vitamin B12 3.  Continue zoloft 50mg  4.  For severe pain, tylenol 325mg , benadryl 25mg , metoclopramide 5mg   5.  Return to clinic in 2 months

## 2014-12-04 NOTE — Progress Notes (Signed)
[facility] Clinic Note - Initial Visit   Date: 12/04/2014   Monique [name] MRN: [id number] DOB: [birth year]   Dear Dr. Doug [name]:  Thank you for your kind referral of Monique [name] for consultation of headaches. Although her history is well known to you, please allow [place] to reiterate it for the purpose of our medical record. The patient was accompanied to the clinic by self.    History of Present Illness: Monique [name] is a 79 y.o. righ-handed African American female with atrial fibrillation (on xeralto), pulmonary embolism, CHF, hyperlipidemia, SAH presenting for evaluation of right sided headaches.    Starting around early February, she developed right forehead and temporal headache.  Pain is described as throbbing and intermittent.  She has not noticed any triggers such as laying flat or sneezing.  Pain generally lasts about an hour and occurs about 3-4 times per week, however over the past week, it is now only occuring 1-2 times per week.  She went to the ED on 3/25 because of the intensity and was given toradol which helped.   Denies any eye pain, vision changes, or temporal tenderness.  She tried tylenol which did not help. She has not tried ibuprofen or naprosyn.  She has occasional tingling of the left hand but nothing involving the face.  Of note, she was recently started on zoloft for anxiety and has noticed an improvement in her headaches.   Out-side paper records, electronic medical record, and images have been reviewed where available and summarized as:  CT head 11/28/2014: 1. No acute intracranial abnormality. 2. 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  Lab Results  Component Value Date   POCTSEDRATE 15 11/01/2014   Cerebral angriogram 04/02/1999: 2-2.5 MM SACRAL ANEURYSM WITH A NARROW NECK IN THE CAVERNOUS SEGMENT OF THE LEFT INTERNAL CAROTID ARTERY. SEGMENTAL AREAS OF NARROWING INTERSPERSED WITH NORMAL DIAMETER  INVOLVING THE LEFT INTERNAL CAROTID ARTERY, THE RIGHT INTERNAL CAROTID ARTERY AND THE RIGHT VERTEBRAL ARTERY AND THE DISTAL CERVICAL SEGMENTS. THESE FINDINGS ARE MOST CONSISTENT WITH FIBROMUSCULAR DYSPLASIA. A FUSIFORM DILATATION OF THE LEFT INTERNAL CAROTID ARTERY PROXIMAL TO THE PETROUS SEGMENT IS ALSO MOST LIKELY RELATED TO THE FIBROMUSCULAR DYSPLASIA. NO ANGIOGRAPHIC EVIDENCE OF SPASM IS IDENTIFIED AT THIS TIME.   Past Medical History  Diagnosis Date  . PAF (paroxysmal atrial fibrillation) 2000  . LVH (left ventricular hypertrophy)   . Subarachnoid hemorrhage 2000  . Hyperlipidemia   . HTN (hypertension)     Essential  . Chronic anticoagulation 06/2014    Coumadin started after bilateral PE  . PE (pulmonary embolism) 06/2014    Bilateral  . CHF (congestive heart failure)   . Heart murmur     Past Surgical History  Procedure Laterality Date  . Abdominal hysterectomy    . Cardiac catheterization  05/2008    Nl cors, no gradient, EF 60%     Medications:  Current Outpatient Prescriptions on File Prior to Visit  Medication Sig Dispense Refill  . disopyramide (NORPACE) 150 MG capsule Take 150 mg by mouth 2 (two) times daily.     . fluticasone (FLONASE) 50 MCG/ACT nasal spray Place 2 sprays into both nostrils daily. 16 g 6  . meclizine (ANTIVERT) 12.5 MG tablet Take 1 tablet (12.5 mg total) by mouth 3 (three) times daily as needed for dizziness. 30 tablet 0  . metoCLOPramide (REGLAN) 10 MG tablet Take 0.5 tablets (5 mg total) by mouth every 8 (eight) hours as needed (headache). 10 tablet  0  . metoprolol tartrate (LOPRESSOR) 25 MG tablet Take 0.5 tablets (12.5 mg total) by mouth 2 (two) times daily. 60 tablet 0  . mirtazapine (REMERON) 15 MG tablet Take 1 tablet (15 mg total) by mouth at bedtime. 30 tablet 1  . nitroGLYCERIN (NITROSTAT) 0.4 MG SL tablet Place 1 tablet (0.4 mg total) under the tongue every 5 (five) minutes as needed for chest pain. 25 tablet 3  . Rivaroxaban  (XARELTO STARTER PACK) 15 & 20 MG TBPK Take as directed on package: Start with one 71m tablet by mouth twice a day with food. On Day 22, switch to one 234mtablet once a day with food. (Patient taking differently: Take 15-20 mg by mouth as directed. Dosepak: Take as directed on package: Start with one 1589mablet by mouth twice a day with food. On Day 22, switch to one 67m45mblet once a day with food.) 51 each 0  . sertraline (ZOLOFT) 50 MG tablet Take 1 tablet (50 mg total) by mouth daily. 30 tablet 2  . simvastatin (ZOCOR) 40 MG tablet TAKE 1 TABLET (40 MG TOTAL) BY MOUTH AT BEDTIME. 30 tablet 3  . traMADol (ULTRAM) 50 MG tablet Take 1 tablet (50 mg total) by mouth every 12 (twelve) hours as needed. (Patient taking differently: Take 50 mg by mouth every 12 (twelve) hours as needed for moderate pain. ) 30 tablet 0   No current facility-administered medications on file prior to visit.    Allergies: No Known Allergies  Family History: Family History  Problem Relation Age of Onset  . Cancer Mother   . Stroke Mother   . Arrhythmia Mother   . Heart disease Mother   . Arrhythmia Sister   . Arrhythmia Sister     Social History: History   Social History  . Marital Status: Divorced    Spouse Name: N/A  . Number of Children: N/A  . Years of Education: 12   Occupational History  . Retired [employer] Main Topics  . Smoking status: Former Smoker    Quit date: 09/06/1967  . Smokeless tobacco: Never Used  . Alcohol Use: No  . Drug Use: No  . Sexual Activity: Not Currently   Other Topics Concern  . Not on file   Social History Narrative      HSG. Married '61 - '88/divorced. 3 sons - '74, '59, '62, 1 daughter '63; 5 grandchildren. Lives alone.    Retired from [employer] Review of Systems:  CONSTITUTIONAL: No fevers, chills, night sweats, or weight loss.   EYES: No visual changes or eye pain ENT: No hearing changes.  No history of  nose bleeds.   RESPIRATORY: No cough, wheezing and shortness of breath.   CARDIOVASCULAR: Negative for chest pain, and palpitations.   GI: Negative for abdominal discomfort, blood in stools or black stools.  No recent change in bowel habits.   GU:  No history of incontinence.   MUSCLOSKELETAL: No history of joint pain or swelling.  No myalgias.   SKIN: Negative for lesions, rash, and itching.   HEMATOLOGY/ONCOLOGY: Negative for prolonged bleeding, bruising easily, and swollen nodes.  No history of cancer.   ENDOCRINE: Negative for cold or heat intolerance, polydipsia or goiter.   PSYCH:  +depression or anxiety symptoms.   NEURO: As Above.   Vital Signs:  BP 144/98 mmHg  Pulse 73  Ht _0  (1.676 m)  Wt 144 lb 8 oz (65.545  kg)  BMI 23.33 kg/m2  SpO2 99%   General Medical Exam:   General:  Well appearing, comfortable.   Eyes/ENT: see cranial nerve examination.  No temporal tenderness or irregularity of the artery. Neck: No masses appreciated.  Full range of motion without tenderness.  No carotid bruits. Respiratory:  Clear to auscultation, good air entry bilaterally.   Cardiac:  Regular rate and rhythm, systolic murmur.   Extremities:  No deformities, edema, or skin discoloration.   Neurological Exam: MENTAL STATUS including orientation to time, place, person, recent and remote memory, attention span and concentration, language, and fund of knowledge is normal.  Speech is not dysarthric.  CRANIAL NERVES: II:  No visual field defects.  Unremarkable fundi.   III-IV-VI: Pupils equal round and reactive to light.  Normal conjugate, extra-ocular eye movements in all directions of gaze.  No nystagmus.  No ptosis.   V:  Normal facial sensation.     VII:  Normal facial symmetry and movements.  No pathologic facial reflexes.  VIII:  Normal hearing and vestibular function.   IX-X:  Normal palatal movement.   XI:  Normal shoulder shrug and head rotation.   XII:  Normal tongue strength and  range of motion, no deviation or fasciculation.  MOTOR:  No atrophy, fasciculations or abnormal movements.  No pronator drift.  Tone is normal.    Right Upper Extremity:    Left Upper Extremity:    Deltoid  5/5   Deltoid  5/5   Biceps  5/5   Biceps  5/5   Triceps  5/5   Triceps  5/5   Wrist extensors  5/5   Wrist extensors  5/5   Wrist flexors  5/5   Wrist flexors  5/5   Finger extensors  5/5   Finger extensors  5/5   Finger flexors  5/5   Finger flexors  5/5   Dorsal interossei  5/5   Dorsal interossei  5/5   Abductor pollicis  5/5   Abductor pollicis  5/5   Tone (Ashworth scale)  0  Tone (Ashworth scale)  0   Right Lower Extremity:    Left Lower Extremity:    Hip flexors  5/5   Hip flexors  5/5   Hip extensors  5/5   Hip extensors  5/5   Knee flexors  5/5   Knee flexors  5/5   Knee extensors  5/5   Knee extensors  5/5   Dorsiflexors  5/5   Dorsiflexors  5/5   Plantarflexors  5/5   Plantarflexors  5/5   Toe extensors  5/5   Toe extensors  5/5   Toe flexors  5/5   Toe flexors  5/5   Tone (Ashworth scale)  0  Tone (Ashworth scale)  0   MSRs:  Right                                                                 Left brachioradialis 2+  brachioradialis 2+  biceps 2+  biceps 2+  triceps 2+  triceps 2+  patellar 2+  patellar 2+  ankle jerk 2+  ankle jerk 2+  Hoffman no  Hoffman no  plantar response down  plantar response down   SENSORY:  Normal and symmetric perception  of light touch, pinprick, vibration, and proprioception.  Romberg's sign absent.   COORDINATION/GAIT: Normal finger-to- nose-finger and heel-to-shin.  Intact rapid alternating movements bilaterally.  Able to rise from a chair without using arms.  Gait narrow based and stable. Tandem and stressed gait intact.    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  Her neurological exam is normal and non-focal.  I have reviewed her CT head and ESR which is within normal limits.  Her previous imaging from 2000 (per report) shows small left aneurysm at the cavernous segment of the ICA which has not been reevauated since then.  With her age and new onset headaches, I will obtain vessel imaging as well as MRI brain to look for structural pathology.  My suspicion for finding something worrisome is low, especially has headache frequency and intensity has improved since start zoloft, which can be also used for headache prevention.  I do agree that giant cell arteritis is unlikely with normal ESR, absence of temporal tenderness, and vision complaints.   PLAN/RECOMMENDATIONS:  1.  MRI/A brain and MRA neck 2.  Continue zoloft 567m3.  For severe pain, tylenol 3252mbenadryl 10m40metoclopramide 5mg 14m  Return to clinic in 2-mon31-monthhe duration of this appointment visit was 40 minutes of face-to-face time with the patient.  Greater than 50% of this time was spent in counseling, explanation of diagnosis, planning of further management, and coordination of care.   Thank you for allowing me to participate in patient's care.  If I can answer any additional questions, I would be pleased to do so.    Sincerely,    Monique [name],Posey Pronto

## 2014-12-05 NOTE — Assessment & Plan Note (Signed)
Will refer to neurology and see if they can evaluate. She is taking quite a lot of OTC medication and having daily headaches. Previously checked with ESR negative making temporal arteritis not likely.  

## 2014-12-05 NOTE — Progress Notes (Signed)
   Subjective:    Patient ID: Monique [name], female    DOB: February 13, 1936, 79 y.o.   MRN: [id number]  HPI The patient is a 79 YO female who is coming in for hospital follow up. She was in the hospital (notes reviewed, in for new PE in her lung although on coumadin and therapeutic). She is now feeling better with her breathing and is taking the new blood thinner without problems. Denies any problems with bleeding or dark stools. Her other problem is still the same and has been seen in the ER immediately following discharge (ER with CT head for headache negative for bleed) She is still having the headaches almost daily and still states that she is getting no relief although she is taking medication almost daily for it. Wants to see a neurologist, tried to call and they could not get her in before May.   Review of Systems  Constitutional: Negative for fever, chills, activity change, appetite change, fatigue and unexpected weight change.  Respiratory: Negative for cough, chest tightness, shortness of breath and wheezing.   Cardiovascular: Negative for chest pain, palpitations and leg swelling.  Gastrointestinal: Negative for nausea, abdominal pain, diarrhea, constipation, blood in stool and abdominal distention.  Genitourinary: Negative.   Musculoskeletal: Negative.   Skin: Negative.   Neurological: Negative for dizziness, weakness and light-headedness.      Objective:   Physical Exam  Constitutional: She is oriented to person, place, and time. She appears well-developed and well-nourished.  HENT:  Head: Normocephalic and atraumatic.     Eyes: EOM are normal.  Neck: Normal range of motion.  Cardiovascular: Normal rate and regular rhythm.   Pulmonary/Chest: Effort normal and breath sounds normal. No respiratory distress. She has no wheezes. She has no rales.  Abdominal: Soft.  Musculoskeletal: She exhibits no edema.  Neurological: She is alert and oriented to person, place, and time.  Skin:  Skin is warm and dry.   Filed Vitals:   12/02/14 1310 12/02/14 1333 12/02/14 1335  BP: 138/78 130/84 110/78  Pulse: 79 78 87  Temp: 98.3 F (36.8 C)    TempSrc: Oral    Resp: 12    Height: 5\' 6"  (1.676 m)    Weight: 145 lb (65.772 kg)    SpO2: 97%        Assessment & Plan:

## 2014-12-05 NOTE — Assessment & Plan Note (Signed)
Recently with repeat CT head without change to this and no new bleeding.

## 2014-12-05 NOTE — Assessment & Plan Note (Signed)
Repeat orthostatic vital signs today without signs of orthostatic hypotension.

## 2014-12-05 NOTE — Assessment & Plan Note (Signed)
Taking the xarelto now without problems. Seems to be doing better from breathing standpoint. No bleeding problems now.

## 2014-12-08 ENCOUNTER — Encounter: Payer: Self-pay | Admitting: Internal Medicine

## 2014-12-08 ENCOUNTER — Ambulatory Visit (INDEPENDENT_AMBULATORY_CARE_PROVIDER_SITE_OTHER): Payer: Medicare Other | Admitting: Internal Medicine

## 2014-12-08 VITALS — BP 158/100 | HR 68 | Ht 66.0 in

## 2014-12-08 DIAGNOSIS — R002 Palpitations: Secondary | ICD-10-CM | POA: Diagnosis not present

## 2014-12-08 MED ORDER — DISOPYRAMIDE PHOSPHATE 150 MG PO CAPS: 150.0000 mg | ORAL_CAPSULE | Freq: Two times a day (BID) | ORAL | Status: DC

## 2014-12-08 NOTE — Progress Notes (Signed)
Cardiology Office Note   Date:  12/08/2014   ID:  [name], Monique [name] Jun 26, 1936, MRN [id number]  PCP:  Olga Millers, MD  Cardiologist:   Dorris Carnes, MD   Chief Complaint  Patient presents with  . Congestive Heart Failure      History of Present Illness: Monique [name] is a 79 y.o. female who  has a history of HOCM, HTN, HLD, PAF, and prior subarachnoid hemorrhage and  bilateral PE. I saw her back in the fall Shw was seen by B Hager since.  In march she developed CP and SOB  Admitted to hosp service with PE on coumadin Rx  She was started on Xarelto.  She should have f/u in hematology clinic  During this hosp she had problemss with low BP and meds were adjusted  Midodrine was started  Since d/c she has noted some heart racing.  This is associated with dizzienss.   Breathing is better   No chest tightness.      Past Medical History  Diagnosis Date  . PAF (paroxysmal atrial fibrillation) 2000  . LVH (left ventricular hypertrophy)   . Subarachnoid hemorrhage 2000  . Hyperlipidemia   . HTN (hypertension)     Essential  . Chronic anticoagulation 06/2014    Coumadin started after bilateral PE  . PE (pulmonary embolism) 06/2014    Bilateral  . CHF (congestive heart failure)   . Heart murmur     Past Surgical History  Procedure Laterality Date  . Abdominal hysterectomy    . Cardiac catheterization  05/2008    Nl cors, no gradient, EF 60%     Current Outpatient Prescriptions  Medication Sig Dispense Refill  . disopyramide (NORPACE) 150 MG capsule Take 150 mg by mouth 2 (two) times daily.     . fluticasone (FLONASE) 50 MCG/ACT nasal spray Place 2 sprays into both nostrils daily. 16 g 6  . meclizine (ANTIVERT) 12.5 MG tablet Take 1 tablet (12.5 mg total) by mouth 3 (three) times daily as needed for dizziness. 30 tablet 0  . metoCLOPramide (REGLAN) 10 MG tablet Take 0.5 tablets (5 mg total) by mouth every 8 (eight) hours as needed (headache). 10 tablet 0  .  metoprolol tartrate (LOPRESSOR) 25 MG tablet Take 0.5 tablets (12.5 mg total) by mouth 2 (two) times daily. 60 tablet 0  . midodrine (PROAMATINE) 5 MG tablet Take 5 mg by mouth 3 (three) times daily with meals.     . mirtazapine (REMERON) 15 MG tablet Take 1 tablet (15 mg total) by mouth at bedtime. 30 tablet 1  . nitroGLYCERIN (NITROSTAT) 0.4 MG SL tablet Place 1 tablet (0.4 mg total) under the tongue every 5 (five) minutes as needed for chest pain. 25 tablet 3  . Rivaroxaban (XARELTO STARTER PACK) 15 & 20 MG TBPK Take as directed on package: Start with one 15mg  tablet by mouth twice a day with food. On Day 22, switch to one 20mg  tablet once a day with food. (Patient taking differently: Take 15-20 mg by mouth as directed. Dosepak: Take as directed on package: Start with one 15mg  tablet by mouth twice a day with food. On Day 22, switch to one 20mg  tablet once a day with food.) 51 each 0  . sertraline (ZOLOFT) 50 MG tablet Take 1 tablet (50 mg total) by mouth daily. 30 tablet 2  . simvastatin (ZOCOR) 40 MG tablet TAKE 1 TABLET (40 MG TOTAL) BY MOUTH AT BEDTIME. 30 tablet 3  .  traMADol (ULTRAM) 50 MG tablet Take 50 mg by mouth every 12 (twelve) hours as needed.  0   No current facility-administered medications for this visit.    Allergies:   Review of patient's allergies indicates no known allergies.    Social History:  The patient  reports that she quit smoking about 47 years ago. She has never used smokeless tobacco. She reports that she does not drink alcohol or use illicit drugs.   Family History:  The patient's family history includes Arrhythmia in her mother, sister, and sister; Cancer in her mother; Heart disease in her mother; Stroke in her mother.    ROS:  Please see the history of present illness.  All other systems are reviewed and negative.    PHYSICAL EXAM: VS:  BP 158/100 mmHg  Pulse 68  Ht 5\' 6"  (1.676 m)  SpO2 97% , BMI There is no weight on file to calculate BMI. GEN: Well  nourished, well developed, in no acute distress HEENT: normal Neck: no JVD, carotid bruits, or masses Cardiac:RRR; Gr II/VI systolic murmur at base  , rubs, or gallops,no edema  Respiratory:  clear to auscultation bilaterally, normal work of breathing GI: soft, nontender, nondistended, + BS MS: no deformity or atrophy Skin: warm and dry, no rash Neuro:  Strength and sensation are intact Psych: euthymic mood, full affect   EKG:  EKGnot done   Recent Labs: 06/27/2014: Pro B Natriuretic peptide (BNP) 3231.0* 11/20/2014: Magnesium 2.1; TSH 0.91 11/21/2014: B Natriuretic Peptide 68.3 11/28/2014: ALT 31; BUN 26*; Hemoglobin 15.3*; Platelets 153; Potassium 3.3*; Sodium 139 12/04/2014: Creatinine 1.59*    Lipid Panel    Component Value Date/Time   CHOL 161 12/24/2012 0925   TRIG 88.0 12/24/2012 0925   HDL 58.60 12/24/2012 0925   CHOLHDL 3 12/24/2012 0925   VLDL 17.6 12/24/2012 0925   LDLCALC 85 12/24/2012 0925      Wt Readings from Last 3 Encounters:  12/04/14 144 lb 8 oz (65.545 kg)  12/02/14 145 lb (65.772 kg)  11/20/14 152 lb 0.4 oz (68.958 kg)      ASSESSMENT AND PLAN:  1.  Pulmonary emboli  REcurrence on coumadin  Concerning   On Xarelto now  Will f/u in heme  2.  HTN  BP is now high on midodrine  I would stop  F/U BP later this week  May need to add back Cozaar  3.  HOCM  On Norpace  Will review records  Complaining now of cost.  4. Atrial fib  Unfort paitent just finished monitor.  Doubt another would be covered  Keep on current regimen  5.  Hx CVA/ICH   Current medicines are reviewed at length with the patient today.    Signed, Dorris Carnes, MD  12/08/2014 12:43 PM    [place], [place], [place]  [address] Phone: [phone]; Fax: [phone]

## 2014-12-08 NOTE — Patient Instructions (Signed)
Your physician has recommended you make the following change in your medication:  1.) STOP MIDODRINE  Your physician recommends that you schedule a follow-up appointment WITH THE NURSE FOR ORTHOSTATIC BLOOD PRESSURE CHECK ON 12/12/14. Your physician recommends that you schedule a follow-up appointment WITH DR ROSS ON 12/29/14.  Your physician has recommended that you wear an event monitor. Event monitors are medical devices that record the heart's electrical activity. Doctors most often [place] these monitors to diagnose arrhythmias. Arrhythmias are problems with the speed or rhythm of the heartbeat. The monitor is a small, portable device. You can wear one while you do your normal daily activities. This is usually used to diagnose what is causing palpitations/syncope (passing out).

## 2014-12-12 ENCOUNTER — Ambulatory Visit (INDEPENDENT_AMBULATORY_CARE_PROVIDER_SITE_OTHER): Payer: Medicare Other | Admitting: *Deleted

## 2014-12-12 VITALS — BP 134/85 | HR 61

## 2014-12-12 DIAGNOSIS — I951 Orthostatic hypotension: Secondary | ICD-10-CM

## 2014-12-12 NOTE — Progress Notes (Signed)
Patient in for Orthostatic BP check. States she is feeling "some better" and is having "less dizziness overall".  Orthostatic blood pressure readings noted in visit. Routed to Dr. Harrington Challenger. Provided patient information regarding safety in shifting positions, going from lying to sitting/standing or sitting to standing precautions.  Patient verbalized understanding and agreement to take position changes slowly.

## 2014-12-17 ENCOUNTER — Ambulatory Visit
Admission: RE | Admit: 2014-12-17 | Discharge: 2014-12-17 | Disposition: A | Discharge status: Home / Self Care | Payer: Medicare Other | Source: Ambulatory Visit | Attending: Neurology | Admitting: Neurology

## 2014-12-17 DIAGNOSIS — R519 Headache, unspecified: Secondary | ICD-10-CM

## 2014-12-17 DIAGNOSIS — I771 Stricture of artery: Secondary | ICD-10-CM | POA: Diagnosis not present

## 2014-12-17 DIAGNOSIS — R51 Headache: Principal | ICD-10-CM

## 2014-12-17 DIAGNOSIS — I679 Cerebrovascular disease, unspecified: Secondary | ICD-10-CM | POA: Diagnosis not present

## 2014-12-17 DIAGNOSIS — I773 Arterial fibromuscular dysplasia: Secondary | ICD-10-CM

## 2014-12-17 DIAGNOSIS — R531 Weakness: Secondary | ICD-10-CM | POA: Diagnosis not present

## 2014-12-17 MED ORDER — GADOBENATE DIMEGLUMINE 529 MG/ML IV SOLN
13.0000 mL | Freq: Once | INTRAVENOUS | Status: AC | PRN
Start: 2014-12-17 — End: 2014-12-17
  Administered 2014-12-17: 13 mL via INTRAVENOUS

## 2014-12-18 ENCOUNTER — Telehealth: Payer: Self-pay | Admitting: Neurology

## 2014-12-18 NOTE — Telephone Encounter (Signed)
Called and notified patient of her MRI and MRA results, which shows no acute findings. Headaches are improved and responded well to as needed medication.

## 2014-12-22 ENCOUNTER — Other Ambulatory Visit: Payer: Self-pay | Admitting: Internal Medicine

## 2014-12-25 ENCOUNTER — Telehealth: Payer: Self-pay | Admitting: Internal Medicine

## 2014-12-25 MED ORDER — RIVAROXABAN 20 MG PO TABS: 20.0000 mg | ORAL_TABLET | Freq: Every day | ORAL | Status: DC

## 2014-12-25 NOTE — Telephone Encounter (Signed)
Sent in refill of xarelto to take 20 mg daily. She does not need refill of the starter pack.

## 2014-12-25 NOTE — Addendum Note (Signed)
Addended by: Vertell Novak A on: 12/25/2014 05:10 PM   Modules accepted: Orders

## 2014-12-25 NOTE — Telephone Encounter (Signed)
Pt called in and wanted to see if Dr Doug Sou would refill her XARELTO STARTER PACK 15 & 20 MG TBPK [Pharmacy Med

## 2014-12-26 ENCOUNTER — Other Ambulatory Visit: Payer: Self-pay

## 2014-12-26 MED ORDER — RIVAROXABAN 20 MG PO TABS: 20.0000 mg | ORAL_TABLET | Freq: Every day | ORAL | Status: DC

## 2014-12-26 NOTE — Telephone Encounter (Signed)
Notified pt with md response.../lmb 

## 2014-12-29 ENCOUNTER — Ambulatory Visit (INDEPENDENT_AMBULATORY_CARE_PROVIDER_SITE_OTHER): Payer: Medicare Other | Admitting: Internal Medicine

## 2014-12-29 ENCOUNTER — Encounter: Payer: Self-pay | Admitting: Internal Medicine

## 2014-12-29 VITALS — BP 136/88 | HR 60 | Ht 66.0 in | Wt 150.8 lb

## 2014-12-29 DIAGNOSIS — I10 Essential (primary) hypertension: Secondary | ICD-10-CM

## 2014-12-29 DIAGNOSIS — I480 Paroxysmal atrial fibrillation: Secondary | ICD-10-CM

## 2014-12-29 DIAGNOSIS — I2782 Chronic pulmonary embolism: Secondary | ICD-10-CM | POA: Diagnosis not present

## 2014-12-29 DIAGNOSIS — I421 Obstructive hypertrophic cardiomyopathy: Secondary | ICD-10-CM

## 2014-12-29 MED ORDER — METOCLOPRAMIDE HCL 10 MG PO TABS: 5.0000 mg | ORAL_TABLET | Freq: Three times a day (TID) | ORAL | Status: DC | PRN

## 2014-12-29 MED ORDER — METOPROLOL TARTRATE 25 MG PO TABS: 25.0000 mg | ORAL_TABLET | Freq: Two times a day (BID) | ORAL | Status: DC

## 2014-12-29 NOTE — Progress Notes (Signed)
Cardiology Office Note   Date:  12/29/2014   ID:  Monique [name], Monique [name] May 25, 1936, MRN [id number]  PCP:  Olga Millers, MD  Cardiologist:   Dorris Carnes, MD   No chief complaint on file.     History of Present Illness: Monique [name] is a 79 y.o. female with a history of HOCM, HTN, PAF, PE bilateral, HL and SAH  I saw her earlier this winter  On last visit I recomm she stop midodrine due to HTN  Continued other meds  Since seen she has felt good  No CP  Breathing OK  NO dizziness  Current Outpatient Prescriptions  Medication Sig Dispense Refill  . disopyramide (NORPACE) 150 MG capsule Take 1 capsule (150 mg total) by mouth 2 (two) times daily. 60 capsule 11  . fluticasone (FLONASE) 50 MCG/ACT nasal spray Place 2 sprays into both nostrils daily. 16 g 6  . meclizine (ANTIVERT) 12.5 MG tablet Take 1 tablet (12.5 mg total) by mouth 3 (three) times daily as needed for dizziness. 30 tablet 0  . metoCLOPramide (REGLAN) 10 MG tablet Take 0.5 tablets (5 mg total) by mouth every 8 (eight) hours as needed (headache). 10 tablet 0  . metoprolol tartrate (LOPRESSOR) 25 MG tablet Take 0.5 tablets (12.5 mg total) by mouth 2 (two) times daily. 60 tablet 0  . mirtazapine (REMERON) 15 MG tablet Take 1 tablet (15 mg total) by mouth at bedtime. 30 tablet 1  . nitroGLYCERIN (NITROSTAT) 0.4 MG SL tablet Place 1 tablet (0.4 mg total) under the tongue every 5 (five) minutes as needed for chest pain. 25 tablet 3  . rivaroxaban (XARELTO) 20 MG TABS tablet Take 1 tablet (20 mg total) by mouth daily with supper. 30 tablet 6  . sertraline (ZOLOFT) 50 MG tablet Take 1 tablet (50 mg total) by mouth daily. 30 tablet 2  . simvastatin (ZOCOR) 40 MG tablet TAKE 1 TABLET (40 MG TOTAL) BY MOUTH AT BEDTIME. 30 tablet 3  . traMADol (ULTRAM) 50 MG tablet Take 50 mg by mouth every 12 (twelve) hours as needed (for pain).   0   No current facility-administered medications for this visit.    Allergies:   Review of  patient's allergies indicates no known allergies.   Past Medical History  Diagnosis Date  . PAF (paroxysmal atrial fibrillation) 2000  . LVH (left ventricular hypertrophy)   . Subarachnoid hemorrhage 2000  . Hyperlipidemia   . HTN (hypertension)     Essential  . Chronic anticoagulation 06/2014    Coumadin started after bilateral PE  . PE (pulmonary embolism) 06/2014    Bilateral  . CHF (congestive heart failure)   . Heart murmur     Past Surgical History  Procedure Laterality Date  . Abdominal hysterectomy    . Cardiac catheterization  05/2008    Nl cors, no gradient, EF 60%     Social History:  The patient  reports that she quit smoking about 47 years ago. She has never used smokeless tobacco. She reports that she does not drink alcohol or use illicit drugs.   Family History:  The patient's family history includes Arrhythmia in her mother, sister, and sister; Cancer in her mother; Heart disease in her mother; Stroke in her mother.    ROS:  Please see the history of present illness. All other systems are reviewed and  Negative to the above problem except as noted.    PHYSICAL EXAM: VS:  BP 136/88 mmHg  Pulse  60  Ht 5\' 6"  (1.676 m)  Wt 150 lb 12.8 oz (68.402 kg)  BMI 24.35 kg/m2  GEN: Well nourished, well developed, in no acute distress HEENT: normal Neck: no JVD, carotid bruits, or masses Cardiac: RRR; II/VI systolic murmur rubs, or gallops,no edema  Respiratory:  clear to auscultation bilaterally, normal work of breathing GI: soft, nontender, nondistended, + BS  No hepatomegaly  MS: no deformity Moving all extremities   Skin: warm and dry, no rash Neuro:  Strength and sensation are intact  EKG:  EKG is not ordered today.   Lipid Panel    Component Value Date/Time   CHOL 161 12/24/2012 0925   TRIG 88.0 12/24/2012 0925   HDL 58.60 12/24/2012 0925   CHOLHDL 3 12/24/2012 0925   VLDL 17.6 12/24/2012 0925   LDLCALC 85 12/24/2012 0925      Wt Readings from  Last 3 Encounters:  12/29/14 150 lb 12.8 oz (68.402 kg)  12/04/14 144 lb 8 oz (65.545 kg)  12/02/14 145 lb (65.772 kg)      ASSESSMENT AND PLAN:  1.  HTN  Better with d/c of midodrine  2.  PE  Continue anticoagulaton  [employer] does not what to cover Xarelto  3.  HOCM  Keep on same regimen  She says that she has been on Norpace for a long time and has felt beter since taking it  4.  Hx ICH  Unfor needs anticoag  F/U in 3 months.      Signed, Dorris Carnes, MD  12/29/2014 3:37 PM    [facility] [place], [place], [place]  [address] Phone: [phone]; Fax: [phone]

## 2014-12-29 NOTE — Patient Instructions (Signed)
Your physician has recommended you make the following change in your medication:  1.) increase metoprolol to 25 mg twice daily  Your physician recommends that you schedule a follow-up appointment in: 3 months with Dr. Harrington Challenger.

## 2015-01-01 ENCOUNTER — Telehealth: Payer: Self-pay | Admitting: Internal Medicine

## 2015-01-01 NOTE — Telephone Encounter (Signed)
New problem   Pt need Dr Harrington Challenger to call [employer] for her Xarelto. Please advise pt.

## 2015-01-01 NOTE — Telephone Encounter (Signed)
Placed samples of xarelto 20 mg at front desk and called patient to inform.  She wanted to know if Dr. Harrington Challenger called [employer] yet. Was unable to find in chart insurance for prescriptions--patient provided that her member ID is [id number] and the phone number for providers on the back of her card is [phone].

## 2015-01-02 ENCOUNTER — Encounter: Payer: Self-pay | Admitting: Internal Medicine

## 2015-01-02 ENCOUNTER — Ambulatory Visit (INDEPENDENT_AMBULATORY_CARE_PROVIDER_SITE_OTHER): Payer: Medicare Other | Admitting: Internal Medicine

## 2015-01-02 ENCOUNTER — Other Ambulatory Visit (INDEPENDENT_AMBULATORY_CARE_PROVIDER_SITE_OTHER): Payer: Medicare Other

## 2015-01-02 VITALS — BP 122/82 | HR 59 | Temp 98.0°F | Resp 12 | Ht 66.0 in | Wt 149.8 lb

## 2015-01-02 DIAGNOSIS — R519 Headache, unspecified: Secondary | ICD-10-CM

## 2015-01-02 DIAGNOSIS — R309 Painful micturition, unspecified: Secondary | ICD-10-CM

## 2015-01-02 DIAGNOSIS — N3000 Acute cystitis without hematuria: Secondary | ICD-10-CM | POA: Diagnosis not present

## 2015-01-02 DIAGNOSIS — N183 Chronic kidney disease, stage 3 (moderate): Secondary | ICD-10-CM

## 2015-01-02 DIAGNOSIS — R51 Headache: Secondary | ICD-10-CM | POA: Diagnosis not present

## 2015-01-02 DIAGNOSIS — R300 Dysuria: Secondary | ICD-10-CM | POA: Diagnosis not present

## 2015-01-02 LAB — POCT URINALYSIS DIPSTICK
Bilirubin, UA: NEGATIVE
Blood, UA: POSITIVE
Glucose, UA: NEGATIVE
Ketones, UA: 0.5
NITRITE UA: NEGATIVE
Protein, UA: 0.15
Spec Grav, UA: >=1.03
Urobilinogen, UA: 4
pH, UA: 5.5

## 2015-01-02 LAB — CBC
HCT: 41.3 % (ref 36.0–46.0)
Hemoglobin: 14.1 g/dL (ref 12.0–15.0)
MCHC: 34.1 g/dL (ref 30.0–36.0)
MCV: 94.8 fl (ref 78.0–100.0)
Platelets: 180 10*3/uL (ref 150.0–400.0)
RBC: 4.36 Mil/uL (ref 3.87–5.11)
RDW: 14.1 % (ref 11.5–15.5)
WBC: 8.2 10*3/uL (ref 4.0–10.5)

## 2015-01-02 LAB — COMPREHENSIVE METABOLIC PANEL
ALBUMIN: 3.9 g/dL (ref 3.5–5.2)
ALK PHOS: 90 U/L (ref 39–117)
ALT: 12 U/L (ref 0–35)
AST: 17 U/L (ref 0–37)
BUN: 22 mg/dL (ref 6–23)
CALCIUM: 9.5 mg/dL (ref 8.4–10.5)
CO2: 29 mEq/L (ref 19–32)
Chloride: 107 mEq/L (ref 96–112)
Creatinine, Ser: 1.41 mg/dL — ABNORMAL HIGH (ref 0.40–1.20)
GFR: 46.54 mL/min — ABNORMAL LOW (ref >60.00)
GLUCOSE: 93 mg/dL (ref 70–99)
Potassium: 4.2 mEq/L (ref 3.5–5.1)
Sodium: 141 mEq/L (ref 135–145)
Total Bilirubin: 0.6 mg/dL (ref 0.2–1.2)
Total Protein: 6.9 g/dL (ref 6.0–8.3)

## 2015-01-02 MED ORDER — CIPROFLOXACIN HCL 250 MG PO TABS: 250.0000 mg | ORAL_TABLET | Freq: Two times a day (BID) | ORAL | Status: DC

## 2015-01-02 NOTE — Progress Notes (Signed)
Pre visit review using our clinic review tool, if applicable. No additional management support is needed unless otherwise documented below in the visit note. 

## 2015-01-02 NOTE — Patient Instructions (Signed)
We have copied down the numbers from your card for Dr. Alan Ripper office for the [place].   I have sent in an antibiotic for the urine infection called ciprofloxacin. Take 1 pill twice a day until it is gone.   Come back in about 3-4 months to check in with [place]. We will check your blood today and call you back with the results.

## 2015-01-04 DIAGNOSIS — N390 Urinary tract infection, site not specified: Secondary | ICD-10-CM | POA: Insufficient documentation

## 2015-01-04 HISTORY — DX: Urinary tract infection, site not specified: N39.0

## 2015-01-04 NOTE — Assessment & Plan Note (Signed)
Headaches doing better and I think it is resolution of her hemorrhage as well as decreased pain medication (medication overuse headaches).

## 2015-01-04 NOTE — Progress Notes (Signed)
   Subjective:    Patient ID: Monique [name], female    DOB: 04/29/1936, 79 y.o.   MRN: [id number]  HPI The patient is a 79 YO female who is coming in to follow up on her headaches. They are finally improving and are not daily now. She has tried to limit medication for them. Denies vision change or aura symptoms now. She also thinks that she has a uti today. She has increased urination and urgency. Mild pain when urinating. No stomach or back pain. No fevers or chills.   Review of Systems  Constitutional: Negative for fever, chills, activity change, appetite change, fatigue and unexpected weight change.  Respiratory: Negative for cough, chest tightness, shortness of breath and wheezing.   Cardiovascular: Negative for chest pain, palpitations and leg swelling.  Gastrointestinal: Negative for nausea, abdominal pain, diarrhea, constipation, blood in stool and abdominal distention.  Genitourinary: Positive for dysuria, urgency and frequency. Negative for flank pain, difficulty urinating, pelvic pain and dyspareunia.  Neurological: Negative for dizziness, weakness and light-headedness.      Objective:   Physical Exam  Constitutional: She is oriented to person, place, and time. She appears well-developed and well-nourished.  HENT:  Head: Normocephalic and atraumatic.     Eyes: EOM are normal.  Neck: Normal range of motion.  Cardiovascular: Normal rate and regular rhythm.   Pulmonary/Chest: Effort normal and breath sounds normal. No respiratory distress. She has no wheezes. She has no rales.  Abdominal: Soft.  Musculoskeletal: She exhibits no edema.  Neurological: She is alert and oriented to person, place, and time.  Skin: Skin is warm and dry.   Filed Vitals:   01/02/15 1258  BP: 122/82  Pulse: 59  Temp: 98 F (36.7 C)  TempSrc: Oral  Resp: 12  Height: 5\' 6"  (1.676 m)  Weight: 149 lb 12.8 oz (67.949 kg)  SpO2: 97%      Assessment & Plan:

## 2015-01-04 NOTE — Assessment & Plan Note (Signed)
U/A in the office showed signs of infection. Rx for ciprofloxacin today.

## 2015-01-06 ENCOUNTER — Telehealth: Payer: Self-pay

## 2015-01-06 ENCOUNTER — Other Ambulatory Visit: Payer: Self-pay | Admitting: Internal Medicine

## 2015-01-06 NOTE — Telephone Encounter (Signed)
Appeal sent to Medicare part D for approval of Xarelto 15mg .

## 2015-01-07 LAB — URINE CULTURE

## 2015-01-08 ENCOUNTER — Other Ambulatory Visit: Payer: Self-pay | Admitting: Internal Medicine

## 2015-01-13 ENCOUNTER — Telehealth: Payer: Self-pay | Admitting: Internal Medicine

## 2015-01-13 NOTE — Telephone Encounter (Signed)
Patient is requesting her insurance company to be contacted about xarelto.  States she got a Quarry manager from [employer] stating they will no longer cover.

## 2015-01-14 ENCOUNTER — Other Ambulatory Visit: Payer: Self-pay | Admitting: Geriatric Medicine

## 2015-01-14 MED ORDER — RIVAROXABAN 20 MG PO TABS: 20.0000 mg | ORAL_TABLET | Freq: Every day | ORAL | Status: DC

## 2015-01-14 NOTE — Telephone Encounter (Signed)
Patient called back regarding note below

## 2015-01-14 NOTE — Telephone Encounter (Signed)
Spoke with patient and I am resubmitting to the pharmacy. If no coverage, the PA will be kicked back and I will complete it.

## 2015-01-19 ENCOUNTER — Telehealth: Payer: Self-pay | Admitting: Internal Medicine

## 2015-01-19 NOTE — Telephone Encounter (Signed)
Will forward to [employer]

## 2015-01-19 NOTE — Telephone Encounter (Signed)
New message      Johny Shears has been approved til 01-19-2016

## 2015-01-29 ENCOUNTER — Ambulatory Visit (INDEPENDENT_AMBULATORY_CARE_PROVIDER_SITE_OTHER): Payer: Medicare Other | Admitting: Family Medicine

## 2015-01-29 ENCOUNTER — Ambulatory Visit (INDEPENDENT_AMBULATORY_CARE_PROVIDER_SITE_OTHER): Payer: Medicare Other

## 2015-01-29 VITALS — BP 148/86 | HR 60 | Temp 98.0°F | Resp 17 | Ht 66.0 in | Wt 151.0 lb

## 2015-01-29 DIAGNOSIS — K5900 Constipation, unspecified: Secondary | ICD-10-CM | POA: Diagnosis not present

## 2015-01-29 DIAGNOSIS — R1030 Lower abdominal pain, unspecified: Secondary | ICD-10-CM | POA: Diagnosis not present

## 2015-01-29 DIAGNOSIS — R109 Unspecified abdominal pain: Secondary | ICD-10-CM | POA: Diagnosis not present

## 2015-01-29 LAB — POCT UA - MICROSCOPIC ONLY
Bacteria, U Microscopic: NEGATIVE
Casts, Ur, LPF, POC: NEGATIVE
Crystals, Ur, HPF, POC: NEGATIVE
Mucus, UA: NEGATIVE
Yeast, UA: NEGATIVE

## 2015-01-29 LAB — POCT URINALYSIS DIPSTICK
Bilirubin, UA: NEGATIVE
Glucose, UA: NEGATIVE
Ketones, UA: NEGATIVE
Nitrite, UA: NEGATIVE
Protein, UA: NEGATIVE
Spec Grav, UA: 1.015
Urobilinogen, UA: 0.2
pH, UA: 6

## 2015-01-29 LAB — POCT CBC
Granulocyte percent: 48.9 %G (ref 37–80)
HCT, POC: 43.6 % (ref 37.7–47.9)
Hemoglobin: 13.9 g/dL (ref 12.2–16.2)
Lymph, poc: 4.2 — AB (ref 0.6–3.4)
MCH, POC: 31 pg (ref 27–31.2)
MCHC: 32 g/dL (ref 31.8–35.4)
MCV: 96.7 fL (ref 80–97)
MID (CBC): 0.2 (ref 0–0.9)
MPV: 7.8 fL (ref 0–99.8)
PLATELET COUNT, POC: 204 10*3/uL (ref 142–424)
POC Granulocyte: 4.2 (ref 2–6.9)
POC LYMPH PERCENT: 49.2 %L (ref 10–50)
POC MID %: 1.9 %M (ref 0–12)
RBC: 4.51 M/uL (ref 4.04–5.48)
RDW, POC: 14.2 %
WBC: 8.6 10*3/uL (ref 4.6–10.2)

## 2015-01-29 NOTE — Patient Instructions (Signed)
Take MiraLAX one dose daily until stools are on the loose side, then decrease to one half dose daily for a few days, then it would probably be good to take it once or twice a week just to keep you from backing up your stool.  Drink lots of fluids  In the event of getting abruptly worse with more abdominal pain, fever, passing of blood, or any other major concerns return or go to the emergency room if necessary  I will let you know the results of your urine culture

## 2015-01-29 NOTE — Progress Notes (Signed)
Subjective:  Patient ID: Monique [name], female    DOB: October 31, 1935  Age: 79 y.o. MRN: [id number]  79 year old lady who complains of having had some low abdominal pain over the past week. It bothers her more at night. She has a long history of nocturia. That is not new. She says she just hurts across lower abdomen, more on the right groin area, and down into her thighs now. No fever. No dysuria. No nausea or vomiting. No chest pain or shortness of breath. No change in bowel habits. Had a bowel movement this morning. No blood in her stool. Has never had a colonoscopy. She is retired, fairly inactive. No muscular straining activity that she knows of.   Objective:   Flat affect otherwise no acute distress. Chest clear. Heart regular without murmurs. Abdomen has normal bowel sounds. Soft without organomegaly or masses. Some mild nonspecific low abdominal tenderness. No groin tenderness. No palpable hernia. Straight leg raise test negative and hip motion adequate. No bruises or other lesions  Results for orders placed or performed in visit on 01/29/15  POCT urinalysis dipstick  Result Value Ref Range   Color, UA yellow    Clarity, UA clear    Glucose, UA neg    Bilirubin, UA neg    Ketones, UA neg    Spec Grav, UA 1.015    Blood, UA moderate    pH, UA 6.0    Protein, UA neg    Urobilinogen, UA 0.2    Nitrite, UA neg    Leukocytes, UA moderate (2+)   POCT UA - Microscopic Only  Result Value Ref Range   WBC, Ur, HPF, POC 2-5    RBC, urine, microscopic 1-3    Bacteria, U Microscopic neg    Mucus, UA neg    Epithelial cells, urine per micros 0-3    Crystals, Ur, HPF, POC neg    Casts, Ur, LPF, POC neg    Yeast, UA neg   POCT CBC  Result Value Ref Range   WBC 8.6 4.6 - 10.2 K/uL   Lymph, poc 4.2 (A) 0.6 - 3.4   POC LYMPH PERCENT 49.2 10 - 50 %L   MID (cbc) 0.2 0 - 0.9   POC MID % 1.9 0 - 12 %M   POC Granulocyte 4.2 2 - 6.9   Granulocyte percent 48.9 37 - 80 %G   RBC 4.51 4.04 -  5.48 M/uL   Hemoglobin 13.9 12.2 - 16.2 g/dL   HCT, POC 43.6 37.7 - 47.9 %   MCV 96.7 80 - 97 fL   MCH, POC 31.0 27 - 31.2 pg   MCHC 32.0 31.8 - 35.4 g/dL   RDW, POC 14.2 %   Platelet Count, POC 204 142 - 424 K/uL   MPV 7.8 0 - 99.8 fL   [facility] reading (PRIMARY) by  Dr. Linna Darner Nonspecific.  Moderate stool burden.    Assessment & Plan:   Assessment: Nonspecific low abdominal pain constipation   Plan: Patient Instructions  Take MiraLAX one dose daily until stools are on the loose side, then decrease to one half dose daily for a few days, then it would probably be good to take it once or twice a week just to keep you from backing up your stool.  Drink lots of fluids  In the event of getting abruptly worse with more abdominal pain, fever, passing of blood, or any other major concerns return or go to the emergency room if necessary  I  will let you know the results of your urine culture     HOPPER,DAVID, MD 01/29/2015

## 2015-01-30 LAB — URINE CULTURE: Colony Count: 2000

## 2015-02-05 ENCOUNTER — Other Ambulatory Visit: Payer: Self-pay

## 2015-02-05 ENCOUNTER — Telehealth: Payer: Self-pay

## 2015-02-05 ENCOUNTER — Telehealth: Payer: Self-pay | Admitting: Internal Medicine

## 2015-02-05 ENCOUNTER — Ambulatory Visit (INDEPENDENT_AMBULATORY_CARE_PROVIDER_SITE_OTHER): Payer: Medicare Other | Admitting: Physician Assistant

## 2015-02-05 VITALS — BP 168/112 | HR 61 | Temp 98.1°F | Resp 17 | Ht 67.0 in | Wt 151.0 lb

## 2015-02-05 DIAGNOSIS — I10 Essential (primary) hypertension: Secondary | ICD-10-CM | POA: Diagnosis not present

## 2015-02-05 DIAGNOSIS — R1031 Right lower quadrant pain: Secondary | ICD-10-CM | POA: Diagnosis not present

## 2015-02-05 LAB — COMPREHENSIVE METABOLIC PANEL
ALT: 9 U/L (ref 0–35)
AST: 18 U/L (ref 0–37)
Albumin: 4 g/dL (ref 3.5–5.2)
Alkaline Phosphatase: 83 U/L (ref 39–117)
BUN: 23 mg/dL (ref 6–23)
CALCIUM: 9.3 mg/dL (ref 8.4–10.5)
CO2: 25 mEq/L (ref 19–32)
CREATININE: 1.24 mg/dL — AB (ref 0.50–1.10)
Chloride: 105 mEq/L (ref 96–112)
Glucose, Bld: 89 mg/dL (ref 70–99)
POTASSIUM: 4.4 meq/L (ref 3.5–5.3)
Sodium: 140 mEq/L (ref 135–145)
Total Bilirubin: 0.5 mg/dL (ref 0.2–1.2)
Total Protein: 6.6 g/dL (ref 6.0–8.3)

## 2015-02-05 MED ORDER — HYDROCHLOROTHIAZIDE 12.5 MG PO CAPS: 12.5000 mg | ORAL_CAPSULE | Freq: Every day | ORAL | Status: DC

## 2015-02-05 MED ORDER — AMLODIPINE BESYLATE 2.5 MG PO TABS
2.5000 mg | ORAL_TABLET | Freq: Every day | ORAL | Status: DC
Start: 2015-02-05 — End: 2015-03-30

## 2015-02-05 NOTE — Telephone Encounter (Signed)
Patient Name: Monique [name]  DOB: 28-Sep-1935    Initial Comment Caller states hurting on right side and down leg   Nurse Assessment  Nurse: Mallie Mussel, RN, Alveta Heimlich Date/Time (Eastern Time): 02/05/2015 10:40:55 AM  Confirm and document reason for call. If symptomatic, describe symptoms. ---Caller states that she has right sided pain that goes down her leg. She was seen at an UC last Thursday and was diagnosed with constipation and provided her with medication for it. Her last BM was this morning. She rates her pain as 9 on 0-10 scale. The pain has been constant since last night. She has pain in her lower right side in front of her abdomen and sometimes, it goes all the way across her abdomen. Denies fever and vomiting.  Has the patient traveled out of the country within the last 30 days? ---No  Does the patient require triage? ---Yes  Related visit to physician within the last 2 weeks? ---Yes  Does the PT have any chronic conditions? (i.e. diabetes, asthma, etc.) ---Yes  List chronic conditions. ---HTN     Guidelines    Guideline Title Affirmed Question Affirmed Notes  Abdominal Pain - Female [1] SEVERE pain (e.g., excruciating) AND [2] present > 1 hour    Final Disposition User   Go to ED Now Mallie Mussel, RN, Alveta Heimlich    Comments  Caller declines to go to ER per outcome, but she may go back to the UC.

## 2015-02-05 NOTE — Progress Notes (Signed)
Subjective:    Patient ID: Monique [name], female    DOB: [birth year], 79 y.o.   MRN: [id number]  HPI Patient presents with RLQ pain that started over 1 week ago. Was seen 1 week ago with same problem and treated with Miralax for constipation. Pain did not resolve. Pain sometimes radiates across belly or down to thigh, but that is not often. Denies diarrhea, constipation, nausea, vomiting, blood in stool, new urinary sx, fever, vaginal sx, burning sensation in stomach. Endorses increased belching. Appetite is unchanged. Denies change in activity and states that she has a calm lifestyle. Last colonoscopy was over 20 years ago, but was normal. Never told she has diverticulum. Only abdominal surgery was hysterectomy, but still has both ovaries.  BP elevated in office. States that BP not usually this high which is concerning to her. Did not miss doses of metoprolol or norpace and has not made any changes to diet or gained weight. Denies SOB, CP, edema, HA, or dizziness. Has appt with cardiologist Dr. Harrington Challenger next month.   NKDA.  Review of Systems  Constitutional: Negative for fever, chills, activity change, appetite change and fatigue.  Respiratory: Negative for shortness of breath and wheezing.   Cardiovascular: Negative for chest pain, palpitations and leg swelling.  Gastrointestinal: Positive for abdominal pain. Negative for nausea, vomiting, diarrhea, constipation, blood in stool and abdominal distention.  Genitourinary: Negative for dysuria, urgency, frequency, hematuria, decreased urine volume, vaginal bleeding, vaginal discharge and difficulty urinating.  Musculoskeletal: Negative for back pain.  Neurological: Negative for dizziness, light-headedness and headaches.       Objective:   Physical Exam  Constitutional: She is oriented to person, place, and time. She appears well-developed and well-nourished. No distress.  Blood pressure 170/115, pulse 61, temperature 98.1 F (36.7 C),  temperature source Oral, resp. rate 17, height 5\' 7"  (1.702 m), weight 151 lb (68.493 kg), SpO2 93 %.   HENT:  Head: Normocephalic and atraumatic.  Right Ear: External ear normal.  Left Ear: External ear normal.  Eyes: Conjunctivae are normal. Right eye exhibits no discharge. Left eye exhibits no discharge. No scleral icterus.  Cardiovascular: Normal rate, normal heart sounds and intact distal pulses.  Exam reveals no gallop and no friction rub.   No murmur heard. Pulmonary/Chest: Effort normal and breath sounds normal. No respiratory distress. She has no wheezes. She has no rales.  Abdominal: Soft. Bowel sounds are normal. She exhibits no distension and no mass. There is tenderness in the periumbilical area. There is no rigidity, no rebound, no guarding, no CVA tenderness, no tenderness at McBurney's point and negative Murphy's sign. No hernia.  Musculoskeletal: Normal range of motion.  Neurological: She is alert and oriented to person, place, and time.  Skin: Skin is warm and dry. No rash noted. She is not diaphoretic. No erythema. No pallor.  Psychiatric: She has a normal mood and affect. Her behavior is normal. Judgment and thought content normal.        Assessment & Plan:  1. RLQ abdominal pain Had moderate kidney failure so will defer CT at this time in lue of [place]. - Comprehensive metabolic panel - US Abdomen Complete; Future  2. Essential hypertension Has f/u in July with cardiology. Should check BP daily. Anticipatory guidance given. - amLODipine (NORVASC) 2.5 MG tablet; Take 1 tablet (2.5 mg total) by mouth daily.  Dispense: 30 tablet; Refill: [place] PA-C  [facility] 02/05/2015 7:27 PM

## 2015-02-05 NOTE — Telephone Encounter (Signed)
Left VM informing pt that her [place] is at 3:40pm tomorrow at [place] at [address]. Pt cannot eat or drink 8 hours before [place].

## 2015-02-05 NOTE — Patient Instructions (Signed)
[place] scheduled at 3:40pm at [address] Nothing to eat or drink 8 hours before.

## 2015-02-06 ENCOUNTER — Ambulatory Visit
Admission: RE | Admit: 2015-02-06 | Discharge: 2015-02-06 | Disposition: A | Discharge status: Home / Self Care | Payer: Medicare Other | Source: Ambulatory Visit | Attending: Physician Assistant | Admitting: Physician Assistant

## 2015-02-06 ENCOUNTER — Other Ambulatory Visit: Payer: Self-pay | Admitting: Physician Assistant

## 2015-02-06 ENCOUNTER — Other Ambulatory Visit: Payer: Self-pay | Admitting: *Deleted

## 2015-02-06 DIAGNOSIS — R1031 Right lower quadrant pain: Secondary | ICD-10-CM

## 2015-02-07 NOTE — Telephone Encounter (Signed)
Spoke with [facility], who advised change to pelvis and transvaginal US.  I am in agreement of this change.  Due to prep and closing time, they will reschedule for Monday, if not early in the week.   I have attempted to contact Mrs. Wicklund multiple times, however the line is busy at this time.  I will try again tomorrow.

## 2015-02-10 ENCOUNTER — Telehealth: Payer: Self-pay | Admitting: *Deleted

## 2015-02-10 ENCOUNTER — Telehealth: Payer: Self-pay | Admitting: Physician Assistant

## 2015-02-10 NOTE — Telephone Encounter (Signed)
[facility] Client [facility] Patient Name: Monique [name] Gender: Female DOB: Feb 15, 1936 Age: 79 Y 10 M 18 D Return Phone Number: [phone] (Primary) Address: City/State/Zip:  Client [facility] - Client Client Site [facility] [place], [place] Type Call Call Type Triage / Clinical Relationship To Patient Self Appointment Disposition EMR Appointment Not Necessary Info pasted into Epic Yes Return Phone Number [phone] (Primary) Chief Complaint Leg Pain Initial Comment Caller states hurting on right side and down leg PreDisposition Go to Urgent Care/Walk-In Clinic Nurse Assessment Nurse: Mallie Mussel, RN, Alveta Heimlich Date/Time Eilene Ghazi Time): 02/05/2015 10:40:55 AM Confirm and document reason for call. If symptomatic, describe symptoms. ---Caller states that she has right sided pain that goes down her leg. She was seen at an UC last Thursday and was diagnosed with constipation and provided her with medication for it. Her last BM was this morning. She rates her pain as 9 on 0-10 scale. The pain has been constant since last night. She has pain in her lower right side in front of her abdomen and sometimes, it goes all the way across her abdomen. Denies fever and vomiting. Has the patient traveled out of the country within the last 30 days? ---No Does the patient require triage? ---Yes Related visit to physician within the last 2 weeks? ---Yes Does the PT have any chronic conditions? (i.e. diabetes, asthma, etc.) ---Yes List chronic conditions. ---HTN Guidelines Guideline Title Affirmed Question Affirmed Notes Nurse Date/Time Eilene Ghazi Time) Abdominal Pain - Female [1] SEVERE pain (e.g., excruciating) AND [2] present > 1 hour Mallie Mussel, RN, Alveta Heimlich 02/05/2015 10:44:17 AM Disp. Time Eilene Ghazi Time) Disposition Final User 02/05/2015 10:45:24 AM Go to ED Now Yes Mallie Mussel, RN,  Ola Spurr Understands: Yes PLEASE NOTE: All timestamps contained within this report are represented as [place] Standard Time. CONFIDENTIALTY NOTICE: This fax transmission is intended only for the addressee. It contains information that is legally privileged, confidential or otherwise protected from use or disclosure. If you are not the intended recipient, you are strictly prohibited from reviewing, disclosing, copying using or disseminating any of this information or taking any action in reliance on or regarding this information. If you have received this fax in error, please notify us immediately by telephone so that we can arrange for its return to [place]. Phone: [phone], Toll-Free: [phone], Fax: [phone] Page: 2 of 2 Call Id: [id number] Disagree/Comply: Disagree Disagree/Comply

## 2015-02-10 NOTE — Telephone Encounter (Signed)
Left message on phone: Contacted to see how she was doing, and to recap that we have changed her [place], and to see if she has an appointment for this.  You can come get me if she contacts 02/10/2015.

## 2015-02-10 NOTE — Telephone Encounter (Signed)
Contacted patient who has [place] scheduling tomorrow at 4 pm.  She continues to have the abdominal pain and cramping.  She denies diarrhea, nausea, vomiting, or fever.  She states that she was not able to schedule her [place] on Monday, and the only next availability was Wednesday.  She was advised of alarming symptoms that warrant immediate ED visit.  Patient understands.

## 2015-02-11 ENCOUNTER — Ambulatory Visit
Admission: RE | Admit: 2015-02-11 | Discharge: 2015-02-11 | Disposition: A | Discharge status: Home / Self Care | Payer: Medicare Other | Source: Ambulatory Visit | Attending: Physician Assistant | Admitting: Physician Assistant

## 2015-02-11 ENCOUNTER — Other Ambulatory Visit: Payer: Medicare Other

## 2015-02-11 DIAGNOSIS — R1031 Right lower quadrant pain: Secondary | ICD-10-CM | POA: Diagnosis not present

## 2015-02-11 DIAGNOSIS — Z90710 Acquired absence of both cervix and uterus: Secondary | ICD-10-CM | POA: Diagnosis not present

## 2015-02-13 ENCOUNTER — Encounter: Payer: Self-pay | Admitting: Neurology

## 2015-02-13 ENCOUNTER — Ambulatory Visit (INDEPENDENT_AMBULATORY_CARE_PROVIDER_SITE_OTHER): Payer: Medicare Other | Admitting: Neurology

## 2015-02-13 VITALS — BP 140/84 | HR 66 | Ht 67.0 in | Wt 149.2 lb

## 2015-02-13 DIAGNOSIS — R51 Headache: Secondary | ICD-10-CM

## 2015-02-13 DIAGNOSIS — R519 Headache, unspecified: Secondary | ICD-10-CM

## 2015-02-13 NOTE — Progress Notes (Signed)
Follow-up Visit   Date: 02/13/2015    Monique [name] MRN: [id number] DOB: Jan 17, 1936   Interim History: Monique [name] is a 79 y.o. right-handed African American female with atrial fibrillation (on xeralto), pulmonary embolism, CHF, hyperlipidemia, SAH returning for follow-up of right sided headaches.  The patient was accompanied to the clinic by self.    History of present illness: Starting around early February 2016, she developed right forehead and temporal headache.  Pain is described as throbbing and intermittent.  She has not noticed any triggers such as laying flat or sneezing.  Pain generally lasts about an hour and occurs about 3-4 times per week, however over the past week, it is now only occuring 1-2 times per week.  She went to the ED on 3/25 because of the intensity and was given toradol which helped.   Denies any eye pain, vision changes, or temporal tenderness.  She tried tylenol which did not help. She has not tried ibuprofen or naprosyn.  She has occasional tingling of the left hand but nothing involving the face.  Of note, she was recently started on zoloft for anxiety and has noticed an improvement in her headaches.  UPDATE 02/13/2015:  She has significant improvement of her headaches and now only gets occasional pain, which responds to tylenol.  No new neurological complaints.  She is pleased with zoloft and says that it not only helped headaches, but also her mood.  She lives alone and is able to do all ADLs and IADLs.  Walks independently and has not had any recent falls.     Medications:  Current Outpatient Prescriptions on File Prior to Visit  Medication Sig Dispense Refill  . amLODipine (NORVASC) 2.5 MG tablet Take 1 tablet (2.5 mg total) by mouth daily. 30 tablet 1  . disopyramide (NORPACE) 150 MG capsule Take 1 capsule (150 mg total) by mouth 2 (two) times daily. 60 capsule 11  . fluticasone (FLONASE) 50 MCG/ACT nasal spray Place 2 sprays into both  nostrils daily. 16 g 6  . hydrochlorothiazide (MICROZIDE) 12.5 MG capsule Take 1 capsule (12.5 mg total) by mouth daily. 30 capsule 1  . meclizine (ANTIVERT) 12.5 MG tablet Take 1 tablet (12.5 mg total) by mouth 3 (three) times daily as needed for dizziness. 30 tablet 0  . metoCLOPramide (REGLAN) 10 MG tablet Take 0.5 tablets (5 mg total) by mouth every 8 (eight) hours as needed (headache). 10 tablet 6  . metoprolol tartrate (LOPRESSOR) 25 MG tablet Take 1 tablet (25 mg total) by mouth 2 (two) times daily. 60 tablet 11  . mirtazapine (REMERON) 15 MG tablet Take 1 tablet (15 mg total) by mouth at bedtime. 30 tablet 1  . nitroGLYCERIN (NITROSTAT) 0.4 MG SL tablet Place 1 tablet (0.4 mg total) under the tongue every 5 (five) minutes as needed for chest pain. 25 tablet 3  . rivaroxaban (XARELTO) 20 MG TABS tablet Take 1 tablet (20 mg total) by mouth daily with supper. 30 tablet 6  . sertraline (ZOLOFT) 50 MG tablet Take 1 tablet (50 mg total) by mouth daily. 30 tablet 2  . simvastatin (ZOCOR) 40 MG tablet TAKE 1 TABLET (40 MG TOTAL) BY MOUTH AT BEDTIME. 30 tablet 3  . traMADol (ULTRAM) 50 MG tablet Take 50 mg by mouth every 12 (twelve) hours as needed (for pain).   0   No current facility-administered medications on file prior to visit.    Allergies: No Known Allergies  Review of Systems:  CONSTITUTIONAL: No fevers, chills, night sweats, or weight loss.  EYES: No visual changes or eye pain ENT: No hearing changes.  No history of nose bleeds.   RESPIRATORY: No cough, wheezing and shortness of breath.   CARDIOVASCULAR: Negative for chest pain, and palpitations.   GI: Negative for abdominal discomfort, blood in stools or black stools.  No recent change in bowel habits.   GU:  No history of incontinence.   MUSCLOSKELETAL: No history of joint pain or swelling.  No myalgias.   SKIN: Negative for lesions, rash, and itching.   ENDOCRINE: Negative for cold or heat intolerance, polydipsia or goiter.     PSYCH:  No depression or anxiety symptoms.   NEURO: As Above.   Vital Signs:  BP 140/84 mmHg  Pulse 66  Ht _0  (1.702 m)  Wt 149 lb 4 oz (67.699 kg)  BMI 23.37 kg/m2  SpO2 97%  Neurological Exam: MENTAL STATUS including orientation to time, place, person, recent and remote memory, attention span and concentration, language, and fund of knowledge is normal.  Speech is not dysarthric.  CRANIAL NERVES: Pupils equal round and reactive to light.  Normal conjugate, extra-ocular eye movements in all directions of gaze.  No ptosis.  Face is symmetric. Palate elevates symmetrically.  Tongue is midline.  MOTOR:  Motor strength is 5/5 in all extremities  No pronator drift.  Tone is normal.    COORDINATION/GAIT:  Normal finger-to- nose-finger and heel-to-shin.  Intact rapid alternating movements bilaterally.  Gait narrow based and stable.   Data: CT head 11/28/2014: 1.  No acute intracranial abnormality. 2. 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  Labs 11/01/2014:  ESR 15  Cerebral angriogram 04/02/1999: 2-2.5 MM SACRAL ANEURYSM WITH A NARROW NECK IN THE CAVERNOUS SEGMENT OF THE LEFT INTERNAL CAROTID ARTERY. SEGMENTAL AREAS OF NARROWING INTERSPERSED  WITH NORMAL DIAMETER   INVOLVING THE LEFT INTERNAL CAROTID ARTERY, THE RIGHT INTERNAL CAROTID ARTERY AND THE RIGHT VERTEBRAL ARTERY AND THE DISTAL CERVICAL SEGMENTS. THESE FINDINGS ARE MOST CONSISTENT WITH FIBROMUSCULAR DYSPLASIA. A FUSIFORM DILATATION OF THE LEFT INTERNAL CAROTID ARTERY PROXIMAL TO THE PETROUS SEGMENT IS ALSO MOST LIKELY RELATED TO THE FIBROMUSCULAR DYSPLASIA. NO ANGIOGRAPHIC EVIDENCE OF SPASM IS IDENTIFIED AT THIS TIME  MRI/A brain 12/17/2014: 1. Stable chronic extra-axial fluid collections, likely chronic hygromas. These are unlikely to be of clinical consequence to the patient. 2. Mild generalized atrophy and white matter disease is evident bilaterally. 3. No acute intracranial abnormality. 4. Mild distal  small vessel disease is noted on the MRA of the head, particularly and the MCA branch vessels. 5. Mild irregularity of the cervical internal carotid arteries bilaterally raises the possibility of fibromuscular dysplasia. 6. There is mild tortuosity in the cervical internal carotid arteries as well without a focal stenosis.   IMPRESSION/PLAN: Ms. 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  Her exam is great, especially for her age without any focal findings. MRI/A brain did not show any new findings, specifically no evidence of left aneurysm of the cavernous segment.  Findings show mild irregularity consistent with FMD.  She is doing well on Zoloft 527mdaily with near resolution of headaches.  OK to use tylenol as needed for episodic headaches.  No home safety issues at this time  Return to clinic as needed    The duration of this  appointment visit was 15 minutes of face-to-face time with the patient.  Greater than 50% of this time was spent in counseling, explanation of diagnosis, planning of further management, and coordination of care.   Thank you for allowing me to participate in patient's care.  If I can answer any additional questions, I would be pleased to do so.    Sincerely,    Torin Modica K. Posey Pronto, DO

## 2015-02-14 ENCOUNTER — Telehealth: Payer: Self-pay | Admitting: Physician Assistant

## 2015-02-14 NOTE — Telephone Encounter (Signed)
Relayed neg [place] results. Suggested Gyn ref as she is still having pain, but patient declines and will make appt with her PCP Dr. Doug Sou.

## 2015-02-21 ENCOUNTER — Other Ambulatory Visit: Payer: Self-pay | Admitting: Internal Medicine

## 2015-02-23 NOTE — Telephone Encounter (Signed)
Pt had appt with PCP in 12/2014.   I have pended rx refill for zoloft 90d with 1 refill for your review.

## 2015-02-23 NOTE — Telephone Encounter (Signed)
zoloft rx request has been sent to [facility] for review

## 2015-03-03 ENCOUNTER — Other Ambulatory Visit: Payer: Self-pay | Admitting: Internal Medicine

## 2015-03-04 ENCOUNTER — Other Ambulatory Visit: Payer: Self-pay | Admitting: Internal Medicine

## 2015-03-30 ENCOUNTER — Encounter: Payer: Self-pay | Admitting: Internal Medicine

## 2015-03-30 ENCOUNTER — Ambulatory Visit (INDEPENDENT_AMBULATORY_CARE_PROVIDER_SITE_OTHER): Payer: Medicare Other | Admitting: Internal Medicine

## 2015-03-30 VITALS — BP 138/84 | HR 65 | Ht 67.0 in | Wt 152.8 lb

## 2015-03-30 DIAGNOSIS — R079 Chest pain, unspecified: Secondary | ICD-10-CM

## 2015-03-30 DIAGNOSIS — I10 Essential (primary) hypertension: Secondary | ICD-10-CM | POA: Diagnosis not present

## 2015-03-30 MED ORDER — SIMVASTATIN 40 MG PO TABS: ORAL_TABLET | ORAL | Status: DC

## 2015-03-30 MED ORDER — AMLODIPINE BESYLATE 2.5 MG PO TABS: 2.5000 mg | ORAL_TABLET | Freq: Every day | ORAL | Status: DC

## 2015-03-30 MED ORDER — NITROGLYCERIN 0.4 MG SL SUBL: 0.4000 mg | SUBLINGUAL_TABLET | SUBLINGUAL | Status: DC | PRN

## 2015-03-30 NOTE — Progress Notes (Signed)
Cardiology Office Note   Date:  03/30/2015   ID:  Monique [name], Monique [name] 1936-07-15, MRN [id number]  PCP:  Olga Millers, MD  Cardiologist:   Dorris Carnes, MD   No chief complaint on file.  F/U of HOCM and PE     History of Present Illness: Monique [name] is a 79 y.o. female with a history of HOCM, HTN, PAF, bilateral PE, HL and SAH    I saw the pt in April  Since seen she has done fairly well  No palpiatations.  Breathing is OK  No CP        Current Outpatient Prescriptions  Medication Sig Dispense Refill  . amLODipine (NORVASC) 2.5 MG tablet Take 1 tablet (2.5 mg total) by mouth daily. 30 tablet 1  . disopyramide (NORPACE) 150 MG capsule Take 1 capsule (150 mg total) by mouth 2 (two) times daily. 60 capsule 11  . fluticasone (FLONASE) 50 MCG/ACT nasal spray Place 2 sprays into both nostrils daily. 16 g 6  . metoCLOPramide (REGLAN) 10 MG tablet Take 0.5 tablets (5 mg total) by mouth every 8 (eight) hours as needed (headache). 10 tablet 6  . metoprolol tartrate (LOPRESSOR) 25 MG tablet Take 1 tablet (25 mg total) by mouth 2 (two) times daily. 60 tablet 11  . mirtazapine (REMERON) 15 MG tablet TAKE 1 TABLET (15 MG TOTAL) BY MOUTH AT BEDTIME. 30 tablet 1  . nitroGLYCERIN (NITROSTAT) 0.4 MG SL tablet Place 1 tablet (0.4 mg total) under the tongue every 5 (five) minutes as needed for chest pain. 25 tablet 3  . rivaroxaban (XARELTO) 20 MG TABS tablet Take 1 tablet (20 mg total) by mouth daily with supper. 30 tablet 6  . sertraline (ZOLOFT) 50 MG tablet TAKE 1 TABLET (50 MG TOTAL) BY MOUTH DAILY. 90 tablet 3  . simvastatin (ZOCOR) 40 MG tablet TAKE 1 TABLET (40 MG TOTAL) BY MOUTH AT BEDTIME. 30 tablet 3   No current facility-administered medications for this visit.    Allergies:   Review of patient's allergies indicates no known allergies.   Past Medical History  Diagnosis Date  . PAF (paroxysmal atrial fibrillation) 2000  . LVH (left ventricular hypertrophy)   .  Subarachnoid hemorrhage 2000  . Hyperlipidemia   . HTN (hypertension)     Essential  . Chronic anticoagulation 06/2014    Coumadin started after bilateral PE  . PE (pulmonary embolism) 06/2014    Bilateral  . CHF (congestive heart failure)   . Heart murmur     Past Surgical History  Procedure Laterality Date  . Abdominal hysterectomy    . Cardiac catheterization  05/2008    Nl cors, no gradient, EF 60%     Social History:  The patient  reports that she quit smoking about 47 years ago. She has never used smokeless tobacco. She reports that she does not drink alcohol or use illicit drugs.   Family History:  The patient's family history includes Arrhythmia in her mother, sister, and sister; Cancer in her mother; Heart disease in her mother; Stroke in her mother.    ROS:  Please see the history of present illness. All other systems are reviewed and  Negative to the above problem except as noted.    PHYSICAL EXAM: VS:  BP 138/84 mmHg  Pulse 65  Ht 5\' 7"  (1.702 m)  Wt 152 lb 12.8 oz (69.31 kg)  BMI 23.93 kg/m2  SpO2 97%  GEN: Well nourished, well developed,  in no acute distress HEENT: normal Neck: no JVD, carotid bruits, or masses Cardiac: RRR; no murmurs, rubs, or gallops,no edema  Respiratory:  clear to auscultation bilaterally, normal work of breathing GI: soft, nontender, nondistended, + BS  No hepatomegaly  MS: no deformity Moving all extremities   Skin: warm and dry, no rash Neuro:  Strength and sensation are intact Psych: euthymic mood, full affect   EKG:  EKG is not ordered today.   Lipid Panel    Component Value Date/Time   CHOL 161 12/24/2012 0925   TRIG 88.0 12/24/2012 0925   HDL 58.60 12/24/2012 0925   CHOLHDL 3 12/24/2012 0925   VLDL 17.6 12/24/2012 0925   LDLCALC 85 12/24/2012 0925      Wt Readings from Last 3 Encounters:  03/30/15 152 lb 12.8 oz (69.31 kg)  02/13/15 149 lb 4 oz (67.699 kg)  02/05/15 151 lb (68.493 kg)      ASSESSMENT AND  PLAN: 1.  PAF  No symptomatic spells  Follow  2.  Hx bilateral PE  Would keep on anticoagulation  Follow CBC  3.  HOCM  No symptoms to sugg obstruction.  4.  HTN  No changes  Adequate control.     Disposition:   FU with me in December    Signed, Dorris Carnes, MD  03/30/2015 3:22 PM    [facility] [place], [place], [place]  [address] Phone: [phone]; Fax: [phone]

## 2015-03-30 NOTE — Patient Instructions (Signed)
Medication Instructions:   Your physician recommends that you continue on your current medications as directed. Please refer to the Current Medication list given to you today.   Labwork:   Testing/Procedures:   Follow-Up:  Your physician wants you to follow-up in:  [place] will receive a reminder letter in the mail two months in advance. If you don't receive a letter, please call our office to schedule the follow-up appointment.    Any Other Special Instructions Will Be Listed Below (If Applicable).

## 2015-04-03 ENCOUNTER — Ambulatory Visit: Payer: Medicare Other | Admitting: Internal Medicine

## 2015-04-16 ENCOUNTER — Ambulatory Visit: Payer: Medicare Other | Admitting: Internal Medicine

## 2015-04-24 ENCOUNTER — Ambulatory Visit (INDEPENDENT_AMBULATORY_CARE_PROVIDER_SITE_OTHER): Payer: Medicare Other | Admitting: Internal Medicine

## 2015-04-24 ENCOUNTER — Encounter: Payer: Self-pay | Admitting: Internal Medicine

## 2015-04-24 VITALS — BP 118/74 | HR 65 | Temp 97.8°F | Resp 14 | Ht 66.0 in | Wt 153.4 lb

## 2015-04-24 DIAGNOSIS — I10 Essential (primary) hypertension: Secondary | ICD-10-CM | POA: Diagnosis not present

## 2015-04-24 DIAGNOSIS — R1031 Right lower quadrant pain: Secondary | ICD-10-CM | POA: Diagnosis not present

## 2015-04-24 DIAGNOSIS — Z23 Encounter for immunization: Secondary | ICD-10-CM

## 2015-04-24 DIAGNOSIS — G8929 Other chronic pain: Secondary | ICD-10-CM | POA: Insufficient documentation

## 2015-04-24 NOTE — Patient Instructions (Signed)
We have given you the pneumonia shot today and this will be good for the rest of your days. We do not need to check any blood work today.  Come back in about 6 months and please feel free to call us with any problems or questions before then.

## 2015-04-24 NOTE — Progress Notes (Signed)
Pre visit review using our clinic review tool, if applicable. No additional management support is needed unless otherwise documented below in the visit note. 

## 2015-04-24 NOTE — Progress Notes (Addendum)
   Subjective:    Patient ID: Monique [name], female    DOB: 29-Sep-1935, 79 y.o.   MRN: [id number]  HPI The patient is a 79 YO female who is coming in for check of her right lower quadrant pain. She was seen at urgent care and US of the pelvis was not revealing. She is not having as many troubles with it now. When her bladder is very full she gets tenderness which passes with emptying her bladder. No fevers or chills. No back or flank pain. No dysuria or smell to the urine.  Review of Systems  Constitutional: Negative for fever, chills, activity change, appetite change, fatigue and unexpected weight change.  Respiratory: Negative for cough, chest tightness, shortness of breath and wheezing.   Cardiovascular: Negative for chest pain, palpitations and leg swelling.  Gastrointestinal: Positive for abdominal pain. Negative for nausea, diarrhea, constipation, blood in stool and abdominal distention.  Genitourinary: Negative for dysuria, urgency, frequency, flank pain, difficulty urinating, pelvic pain and dyspareunia.  Neurological: Negative for dizziness, weakness and light-headedness.      Objective:   Physical Exam  Constitutional: She is oriented to person, place, and time. She appears well-developed and well-nourished.  HENT:  Head: Normocephalic and atraumatic.     Eyes: EOM are normal.  Neck: Normal range of motion.  Cardiovascular: Normal rate and regular rhythm.   Pulmonary/Chest: Effort normal and breath sounds normal. No respiratory distress. She has no wheezes. She has no rales.  Abdominal: Soft. She exhibits no distension and no mass. There is tenderness. There is no rebound and no guarding.  Very minimal tenderness in the RLQ. No guarding or rebound.   Musculoskeletal: She exhibits no edema.  Neurological: She is alert and oriented to person, place, and time. Coordination normal.  Skin: Skin is warm and dry.   Filed Vitals:   04/24/15 1003  BP: 118/74  Pulse: 65  Temp:  97.8 F (36.6 C)  TempSrc: Oral  Resp: 14  Height: 5\' 6"  (1.676 m)  Weight: 153 lb 6.4 oz (69.582 kg)  SpO2: 96%      Assessment & Plan:  Prevnar given at visit.

## 2015-04-24 NOTE — Assessment & Plan Note (Signed)
BP controlled on amlodipine, disopyramide, metoprolol. No signs of orthostatic hypotension (she has had in the past). Recent BMP reviewed during visit and kidney function improved. No indication for labs today.

## 2015-04-24 NOTE — Assessment & Plan Note (Signed)
At this time improving. Pelvic US normal. Will continue to monitor and if no change does not need further imaging. If it increases would pursue abdominal US.

## 2015-04-24 NOTE — Addendum Note (Signed)
Addended by: Resa Miner R on: 04/24/2015 10:26 AM   Modules accepted: Orders

## 2015-05-06 ENCOUNTER — Ambulatory Visit (INDEPENDENT_AMBULATORY_CARE_PROVIDER_SITE_OTHER): Payer: Medicare Other | Admitting: Internal Medicine

## 2015-05-06 ENCOUNTER — Telehealth: Payer: Self-pay | Admitting: Internal Medicine

## 2015-05-06 ENCOUNTER — Other Ambulatory Visit (INDEPENDENT_AMBULATORY_CARE_PROVIDER_SITE_OTHER): Payer: Medicare Other

## 2015-05-06 ENCOUNTER — Encounter: Payer: Self-pay | Admitting: Internal Medicine

## 2015-05-06 VITALS — BP 130/82 | HR 57 | Temp 97.8°F | Ht 66.0 in | Wt 154.0 lb

## 2015-05-06 DIAGNOSIS — N183 Chronic kidney disease, stage 3 unspecified: Secondary | ICD-10-CM

## 2015-05-06 DIAGNOSIS — I10 Essential (primary) hypertension: Secondary | ICD-10-CM

## 2015-05-06 DIAGNOSIS — R350 Frequency of micturition: Secondary | ICD-10-CM

## 2015-05-06 LAB — URINALYSIS, ROUTINE W REFLEX MICROSCOPIC
Bilirubin Urine: NEGATIVE
NITRITE: NEGATIVE
PH: 5.5 (ref 5.0–8.0)
Total Protein, Urine: NEGATIVE
Urine Glucose: NEGATIVE
Urobilinogen, UA: 0.2 (ref 0.0–1.0)

## 2015-05-06 MED ORDER — CEPHALEXIN 500 MG PO CAPS: 500.0000 mg | ORAL_CAPSULE | Freq: Four times a day (QID) | ORAL | Status: DC

## 2015-05-06 NOTE — Assessment & Plan Note (Signed)
stable overall by history and exam, recent data reviewed with pt, and pt to continue medical treatment as before,  to f/u any worsening symptoms or concerns BP Readings from Last 3 Encounters:  05/06/15 130/82  04/24/15 118/74  03/30/15 138/84

## 2015-05-06 NOTE — Assessment & Plan Note (Signed)
stable overall by history and exam, recent data reviewed with pt, and pt to continue medical treatment as before,  to f/u any worsening symptoms or concerns Lab Results  Component Value Date   CREATININE 1.24* 02/05/2015

## 2015-05-06 NOTE — Patient Instructions (Signed)
Please take all new medication as prescribed - the antibiotic  Please continue all other medications as before, and refills have been done if requested.  Please have the pharmacy call with any other refills you may need.  Please keep your appointments with your specialists as you may have planned  Please go to the LAB in the Basement (turn left off the elevator) for the tests to be done today - just the urine testing today  You will be contacted by phone if any changes need to be made immediately.  Otherwise, you will receive a letter about your results with an explanation, but please check with MyChart first.  Please remember to sign up for MyChart if you have not done so, as this will be important to you in the future with finding out test results, communicating by private email, and scheduling acute appointments online when needed.  

## 2015-05-06 NOTE — Progress Notes (Signed)
Pre visit review using our clinic review tool, if applicable. No additional management support is needed unless otherwise documented below in the visit note. 

## 2015-05-06 NOTE — Progress Notes (Signed)
Subjective:    Patient ID: Monique [name], female    DOB: Jun 29, 1936, 79 y.o.   MRN: [id number]  HPI Here with 3 days onset urinary freq, with some urgency but Denies urinary symptoms such as dysuria, flank pain, hematuria or n/v, fever, chills.  Has some slight retention and hard to start a few times, but mostly not.  Last UTI > 1 yr.  Pt denies chest pain, increased sob or doe, wheezing, orthopnea, PND, increased LE swelling, palpitations, dizziness or syncope.  Pt denies new neurological symptoms such as new headache, or facial or extremity weakness or numbness . Pt denies polydipsia, polyuria Past Medical History  Diagnosis Date  . PAF (paroxysmal atrial fibrillation) 2000  . LVH (left ventricular hypertrophy)   . Subarachnoid hemorrhage 2000  . Hyperlipidemia   . HTN (hypertension)     Essential  . Chronic anticoagulation 06/2014    Coumadin started after bilateral PE  . PE (pulmonary embolism) 06/2014    Bilateral  . CHF (congestive heart failure)   . Heart murmur    Past Surgical History  Procedure Laterality Date  . Abdominal hysterectomy    . Cardiac catheterization  05/2008    Nl cors, no gradient, EF 60%    reports that she quit smoking about 47 years ago. She has never used smokeless tobacco. She reports that she does not drink alcohol or use illicit drugs. family history includes Arrhythmia in her mother, sister, and sister; Cancer in her mother; Heart disease in her mother; Stroke in her mother. No Known Allergies Current Outpatient Prescriptions on File Prior to Visit  Medication Sig Dispense Refill  . amLODipine (NORVASC) 2.5 MG tablet Take 1 tablet (2.5 mg total) by mouth daily. 90 tablet 3  . disopyramide (NORPACE) 150 MG capsule Take 1 capsule (150 mg total) by mouth 2 (two) times daily. 60 capsule 11  . fluticasone (FLONASE) 50 MCG/ACT nasal spray Place 2 sprays into both nostrils daily. 16 g 6  . metoCLOPramide (REGLAN) 10 MG tablet Take 0.5 tablets (5 mg  total) by mouth every 8 (eight) hours as needed (headache). 10 tablet 6  . metoprolol tartrate (LOPRESSOR) 25 MG tablet Take 1 tablet (25 mg total) by mouth 2 (two) times daily. 60 tablet 11  . mirtazapine (REMERON) 15 MG tablet TAKE 1 TABLET (15 MG TOTAL) BY MOUTH AT BEDTIME. 30 tablet 1  . nitroGLYCERIN (NITROSTAT) 0.4 MG SL tablet Place 1 tablet (0.4 mg total) under the tongue every 5 (five) minutes as needed for chest pain. 25 tablet 3  . rivaroxaban (XARELTO) 20 MG TABS tablet Take 1 tablet (20 mg total) by mouth daily with supper. 30 tablet 6  . sertraline (ZOLOFT) 50 MG tablet TAKE 1 TABLET (50 MG TOTAL) BY MOUTH DAILY. 90 tablet 3  . simvastatin (ZOCOR) 40 MG tablet TAKE 1 TABLET (40 MG TOTAL) BY MOUTH AT BEDTIME. 90 tablet 3   No current facility-administered medications on file prior to visit.    Review of Systems  Constitutional: Negative for unusual diaphoresis or night sweats HENT: Negative for ringing in ear or discharge Eyes: Negative for double vision or worsening visual disturbance.  Respiratory: Negative for choking and stridor.   Gastrointestinal: Negative for vomiting or other signifcant bowel change Genitourinary: Negative for hematuria or change in urine volume.  Musculoskeletal: Negative for other MSK pain or swelling Skin: Negative for color change and worsening wound.  Neurological: Negative for tremors and numbness other than noted  Psychiatric/Behavioral: Negative  for decreased concentration or agitation other than above       Objective:   Physical Exam BP 130/82 mmHg  Pulse 57  Temp(Src) 97.8 F (36.6 C) (Oral)  Ht 5\' 6"  (1.676 m)  Wt 154 lb (69.854 kg)  BMI 24.87 kg/m2  SpO2 97% VS noted,  Constitutional: Pt appears in no significant distress HENT: Head: NCAT.  Right Ear: External ear normal.  Left Ear: External ear normal.  Eyes: . Pupils are equal, round, and reactive to light. Conjunctivae and EOM are normal Neck: Normal range of motion. Neck  supple.  Cardiovascular: Normal rate and regular rhythm.   Pulmonary/Chest: Effort normal and breath sounds without rales or wheezing.  Abd:  Soft, NT, ND, + BS Neurological: Pt is alert. Not confused , motor grossly intact Skin: Skin is warm. No rash, no LE edema Psychiatric: Pt behavior is normal. No agitation.     Assessment & Plan:

## 2015-05-06 NOTE — Telephone Encounter (Signed)
Patient Name: Monique [name] DOB: 20-Nov-1935 Initial Comment Caller states c/o pelvic pain, frequent urination Nurse Assessment Nurse: Marcelline Deist, RN, Lynda Date/Time (Eastern Time): 05/06/2015 9:51:52 AM Confirm and document reason for call. If symptomatic, describe symptoms. ---Caller states she has pelvic pain, going down into legs & urgency/frequent urination. Has had symptoms for a few days, worse last night. She knows she has kidney stones. No fever. Has the patient traveled out of the country within the last 30 days? ---Not Applicable Does the patient require triage? ---Yes Related visit to physician within the last 2 weeks? ---No Does the PT have any chronic conditions? (i.e. diabetes, asthma, etc.) ---Yes List chronic conditions. ---kidney stones, cholesterol, BP rx, anxiety Guidelines Guideline Title Affirmed Question Affirmed Notes Abdominal Pain - Female [1] MILD-MODERATE pain AND [2] constant AND [3] present > 2 hours Final Disposition User See Physician within 4 Hours (or PCP triage) Marcelline Deist, RN, Kermit Balo Comments During triage for abdominal pain, caller mentioned she had experienced some chest pain yesterday & another time. None currently. Rates her abdominal pain a "5" on scale of 1-10 today, states it went on for awhile last night, very bad, going in to legs. Referrals REFERRED TO PCP OFFICE Disagree/Comply: Comply

## 2015-05-07 LAB — URINE CULTURE
Colony Count: NO GROWTH
Organism ID, Bacteria: NO GROWTH

## 2015-05-08 ENCOUNTER — Ambulatory Visit: Payer: Medicare Other | Admitting: Internal Medicine

## 2015-05-08 ENCOUNTER — Encounter: Payer: Self-pay | Admitting: Internal Medicine

## 2015-05-08 DIAGNOSIS — Z0289 Encounter for other administrative examinations: Secondary | ICD-10-CM

## 2015-08-20 ENCOUNTER — Other Ambulatory Visit: Payer: Self-pay | Admitting: Internal Medicine

## 2015-09-07 ENCOUNTER — Other Ambulatory Visit: Payer: Self-pay | Admitting: Internal Medicine

## 2015-09-17 ENCOUNTER — Ambulatory Visit (INDEPENDENT_AMBULATORY_CARE_PROVIDER_SITE_OTHER): Payer: Medicare Other | Admitting: Internal Medicine

## 2015-09-17 ENCOUNTER — Encounter: Payer: Self-pay | Admitting: Internal Medicine

## 2015-09-17 VITALS — BP 148/82 | HR 63 | Ht 66.0 in | Wt 165.0 lb

## 2015-09-17 DIAGNOSIS — Z23 Encounter for immunization: Secondary | ICD-10-CM

## 2015-09-17 DIAGNOSIS — I10 Essential (primary) hypertension: Secondary | ICD-10-CM

## 2015-09-17 LAB — CBC
HEMATOCRIT: 40.8 % (ref 36.0–46.0)
HEMOGLOBIN: 13.6 g/dL (ref 12.0–15.0)
MCH: 31.3 pg (ref 26.0–34.0)
MCHC: 33.3 g/dL (ref 30.0–36.0)
MCV: 93.8 fL (ref 78.0–100.0)
MPV: 10.2 fL (ref 8.6–12.4)
Platelets: 221 10*3/uL (ref 150–400)
RBC: 4.35 MIL/uL (ref 3.87–5.11)
RDW: 13.6 % (ref 11.5–15.5)
WBC: 10.8 10*3/uL — AB (ref 4.0–10.5)

## 2015-09-17 LAB — BASIC METABOLIC PANEL
BUN: 24 mg/dL (ref 7–25)
CO2: 30 mmol/L (ref 20–31)
Calcium: 9.7 mg/dL (ref 8.6–10.4)
Chloride: 104 mmol/L (ref 98–110)
Creat: 1.44 mg/dL — ABNORMAL HIGH (ref 0.60–0.93)
GLUCOSE: 84 mg/dL (ref 65–99)
POTASSIUM: 3.9 mmol/L (ref 3.5–5.3)
SODIUM: 142 mmol/L (ref 135–146)

## 2015-09-17 NOTE — Progress Notes (Signed)
Cardiology Office Note   Date:  09/17/2015   ID:  Monique [name], Monique [name] 08/11/1936, MRN [id number]  PCP:  Hoyt Koch, MD  Cardiologist:   Dorris Carnes, MD   F/U of HTN and [place]    History of Present Illness: Monique [name] is a 80 y.o. female with a history of  HOCM, HTN, PAF, PE bilateral, HL and SAH  Saw her last in July   SInce then she has done fairly well  Breathign is stable  No CP  No signif palpitations  Last night had pain on L side of head  30 min  A little today      Current Outpatient Prescriptions  Medication Sig Dispense Refill  . amLODipine (NORVASC) 2.5 MG tablet Take 1 tablet (2.5 mg total) by mouth daily. 90 tablet 3  . disopyramide (NORPACE) 150 MG capsule Take 1 capsule (150 mg total) by mouth 2 (two) times daily. 60 capsule 11  . metoprolol tartrate (LOPRESSOR) 25 MG tablet Take 1 tablet (25 mg total) by mouth 2 (two) times daily. 60 tablet 11  . mirtazapine (REMERON) 15 MG tablet TAKE 1 TABLET (15 MG TOTAL) BY MOUTH AT BEDTIME. 30 tablet 1  . nitroGLYCERIN (NITROSTAT) 0.4 MG SL tablet Place 1 tablet (0.4 mg total) under the tongue every 5 (five) minutes as needed for chest pain. 25 tablet 3  . sertraline (ZOLOFT) 50 MG tablet TAKE 1 TABLET (50 MG TOTAL) BY MOUTH DAILY. 90 tablet 3  . simvastatin (ZOCOR) 40 MG tablet TAKE 1 TABLET (40 MG TOTAL) BY MOUTH AT BEDTIME. 90 tablet 3  . XARELTO 20 MG TABS tablet TAKE 1 TABLET (20 MG TOTAL) BY MOUTH DAILY WITH SUPPER. 30 tablet 6   No current facility-administered medications for this visit.    Allergies:   Review of patient's allergies indicates no known allergies.   Past Medical History  Diagnosis Date  . PAF (paroxysmal atrial fibrillation) ([place]) 2000  . LVH (left ventricular hypertrophy)   . Subarachnoid hemorrhage ([place]) 2000  . Hyperlipidemia   . HTN (hypertension)     Essential  . Chronic anticoagulation 06/2014    Coumadin started after bilateral PE  . PE (pulmonary embolism) 06/2014      Bilateral  . CHF (congestive heart failure) ([place])   . Heart murmur     Past Surgical History  Procedure Laterality Date  . Abdominal hysterectomy    . Cardiac catheterization  05/2008    Nl cors, no gradient, EF 60%     Social History:  The patient  reports that she quit smoking about 48 years ago. She has never used smokeless tobacco. She reports that she does not drink alcohol or use illicit drugs.   Family History:  The patient's family history includes Arrhythmia in her mother, sister, and sister; Cancer in her mother; Heart disease in her mother; Stroke in her mother.    ROS:  Please see the history of present illness. All other systems are reviewed and  Negative to the above problem except as noted.    PHYSICAL EXAM: VS:  BP 148/82 mmHg  Pulse 63  Ht 5\' 6"  (1.676 m)  Wt 74.844 kg (165 lb)  BMI 26.64 kg/m2  SpO2 97%  GEN: Well nourished, well developed, in no acute distress HEENT: normal Neck: no JVD, carotid bruits, or masses Cardiac: RRR; Gr II/VI systolic murmur LSB, rubs, or gallops,no edema  Respiratory:  clear to auscultation bilaterally, normal work of breathing GI:  soft, nontender, nondistended, + BS  No hepatomegaly  MS: no deformity Moving all extremities   Skin: warm and dry, no rash Neuro:  Strength and sensation are intact Psych: euthymic mood, full affect   EKG:  EKG is not ordered today.   Lipid Panel    Component Value Date/Time   CHOL 161 12/24/2012 0925   TRIG 88.0 12/24/2012 0925   HDL 58.60 12/24/2012 0925   CHOLHDL 3 12/24/2012 0925   VLDL 17.6 12/24/2012 0925   LDLCALC 85 12/24/2012 0925      Wt Readings from Last 3 Encounters:  09/17/15 74.844 kg (165 lb)  05/06/15 69.854 kg (154 lb)  04/24/15 69.582 kg (153 lb 6.4 oz)      ASSESSMENT AND PLAN: 1  HTN  BP is OK  Continue meds    2  Hx PE  Now 10 mon past last event  Wiill need to review  Probable long termi anticoagulation   Check labs today    3.  HOCM  Contiue  regimen   4.  Hx ICH   Follow  Neuro has signed off  Infrequent HA  Call if more prevalent    F/U next fall  Signed, Dorris Carnes, MD  09/17/2015 4:09 PM    [facility] [address], [place], [place]  [address] Phone: [phone]; Fax: [phone]

## 2015-09-17 NOTE — Patient Instructions (Signed)
Your physician recommends that you continue on your current medications as directed. Please refer to the Current Medication list given to you today. Your physician recommends that you return for lab work today (BMET, CBC)  Your physician wants you to follow-up in: 9 months with Dr. Harrington Challenger.  You will receive a reminder letter in the mail two months in advance. If you don't receive a letter, please call our office to schedule the follow-up appointment.

## 2015-10-23 ENCOUNTER — Ambulatory Visit (INDEPENDENT_AMBULATORY_CARE_PROVIDER_SITE_OTHER): Payer: Medicare Other | Admitting: Nurse Practitioner

## 2015-10-23 ENCOUNTER — Encounter: Payer: Self-pay | Admitting: Nurse Practitioner

## 2015-10-23 VITALS — BP 164/96 | HR 63 | Temp 97.8°F | Resp 16 | Wt 164.0 lb

## 2015-10-23 DIAGNOSIS — R519 Headache, unspecified: Secondary | ICD-10-CM | POA: Insufficient documentation

## 2015-10-23 DIAGNOSIS — I10 Essential (primary) hypertension: Secondary | ICD-10-CM | POA: Diagnosis not present

## 2015-10-23 DIAGNOSIS — R51 Headache: Secondary | ICD-10-CM | POA: Diagnosis not present

## 2015-10-23 NOTE — Assessment & Plan Note (Signed)
BP Readings from Last 3 Encounters:  10/23/15 164/96  09/17/15 148/82  05/06/15 130/82   Steadily going up. Pt taking 2 lopressor tablets daily and norvasc 2.5 mg once daily. Asked her to take a 2nd one tonight. Take 2 tablets (1 in the am and 1 in the pm) for the next 3-4 days and bring in BPs from home to a nurse visit to recheck early next week.  Pt agreeable to this and verbalized understanding through teach back

## 2015-10-23 NOTE — Assessment & Plan Note (Signed)
Followed by Neurology in past for this MRA and MRI of brain in April 2016 shows chronic changes nothing acute Pt has had left side parietal to occipital intermittent HA dull sensation Pt reports tylenol is somewhat helpful, hasn't happened in 1 week  BP is up- see HTN note Advised pt to continue these current measures (tylenol, water, ect...) Seek emergency care if worsens this weekend or changes (discussed stroke signs).  Pt is well appearing and has a age appropriate Neuro Exam  FU with PCP in 1-2 weeks

## 2015-10-23 NOTE — Patient Instructions (Signed)
Lots of water, use tylenol as needed, take a 2nd Amlodipine 2.5 mg tablet tonight.   Take 2 tomorrow (1 in the am and 1 in the pm)   Bring a list of your home blood pressures next week.

## 2015-10-23 NOTE — Progress Notes (Signed)
Pre visit review using our clinic review tool, if applicable. No additional management support is needed unless otherwise documented below in the visit note. 

## 2015-10-23 NOTE — Progress Notes (Signed)
Patient ID: Monique [name], female    DOB: 1936-06-28  Age: 80 y.o. MRN: [id number]  CC: Headache   HPI Monique [name] presents for CC of HA x 1 month.   1) Pt reports an off and on HA that is characterized as dull.  Left side of head to back of left  Did it last week and had one 2 days (Thur and Friday), but hasn't done it this week  Taking her BP medication as scheduled   Checks BP at home goes up and down Tylenol- somewhat helpful Denies any trouble speaking, walking, worse headache of her life sensations, drooping, weakness, bowel/bladder changes.   History Monique [name] has a past medical history of PAF (paroxysmal atrial fibrillation) ([place]) (2000); LVH (left ventricular hypertrophy); Subarachnoid hemorrhage ([place]) (2000); Hyperlipidemia; HTN (hypertension); Chronic anticoagulation (06/2014); PE (pulmonary embolism) (06/2014); CHF (congestive heart failure) ([place]); and Heart murmur.   She has past surgical history that includes Abdominal hysterectomy and Cardiac catheterization (05/2008).   Her family history includes Arrhythmia in her mother, sister, and sister; Cancer in her mother; Heart disease in her mother; Stroke in her mother.She reports that she quit smoking about 48 years ago. She has never used smokeless tobacco. She reports that she does not drink alcohol or use illicit drugs.  Outpatient Prescriptions Prior to Visit  Medication Sig Dispense Refill  . amLODipine (NORVASC) 2.5 MG tablet Take 1 tablet (2.5 mg total) by mouth daily. 90 tablet 3  . disopyramide (NORPACE) 150 MG capsule Take 1 capsule (150 mg total) by mouth 2 (two) times daily. 60 capsule 11  . metoprolol tartrate (LOPRESSOR) 25 MG tablet Take 1 tablet (25 mg total) by mouth 2 (two) times daily. 60 tablet 11  . mirtazapine (REMERON) 15 MG tablet TAKE 1 TABLET (15 MG TOTAL) BY MOUTH AT BEDTIME. 30 tablet 1  . nitroGLYCERIN (NITROSTAT) 0.4 MG SL tablet Place 1 tablet (0.4 mg total) under the tongue every 5 (five)  minutes as needed for chest pain. 25 tablet 3  . sertraline (ZOLOFT) 50 MG tablet TAKE 1 TABLET (50 MG TOTAL) BY MOUTH DAILY. 90 tablet 3  . simvastatin (ZOCOR) 40 MG tablet TAKE 1 TABLET (40 MG TOTAL) BY MOUTH AT BEDTIME. 90 tablet 3  . XARELTO 20 MG TABS tablet TAKE 1 TABLET (20 MG TOTAL) BY MOUTH DAILY WITH SUPPER. 30 tablet 6   No facility-administered medications prior to visit.    ROS Review of Systems  Constitutional: Negative for fever, chills, diaphoresis and fatigue.  HENT: Negative for congestion and sinus pressure.   Eyes: Positive for discharge. Negative for photophobia, pain, redness, itching and visual disturbance.       Eyes watering  Respiratory: Negative for chest tightness, shortness of breath and wheezing.   Cardiovascular: Negative for chest pain, palpitations and leg swelling.  Gastrointestinal: Negative for nausea, vomiting and diarrhea.  Musculoskeletal: Negative for neck pain and neck stiffness.  Skin: Negative for rash.  Neurological: Negative for dizziness, tremors, seizures, syncope, facial asymmetry, speech difficulty, weakness, light-headedness, numbness and headaches.  Hematological: Does not bruise/bleed easily.    Objective:  BP 164/96 mmHg  Pulse 63  Temp(Src) 97.8 F (36.6 C) (Oral)  Resp 16  Wt 164 lb (74.39 kg)  SpO2 97%  Physical Exam  Constitutional: She is oriented to person, place, and time. She appears well-developed and well-nourished. No distress.  HENT:  Head: Normocephalic and atraumatic.  Right Ear: External ear normal.  Left Ear: External ear normal.  Nose:  Nose normal.  Mouth/Throat: Oropharynx is clear and moist. No oropharyngeal exudate.  TMs and canals clear bilaterally  Eyes: Conjunctivae and EOM are normal. Pupils are equal, round, and reactive to light. Right eye exhibits no discharge. Left eye exhibits no discharge. No scleral icterus.  Glasses and cataracts  Neck: Normal range of motion. Neck supple. No thyromegaly  present.  Cardiovascular: Normal rate, regular rhythm and normal heart sounds.  Exam reveals no gallop and no friction rub.   No murmur heard. Pulmonary/Chest: Effort normal and breath sounds normal. No respiratory distress. She has no wheezes. She has no rales. She exhibits no tenderness.  Musculoskeletal: Normal range of motion. She exhibits no edema or tenderness.  Lymphadenopathy:    She has no cervical adenopathy.  Neurological: She is alert and oriented to person, place, and time. She has normal reflexes. No cranial nerve deficit. She exhibits normal muscle tone. Coordination normal.  Normal neuro exam of cranial nerves, sensation, coordination, tone UE and LE, rapid movements   Skin: Skin is warm and dry. No rash noted. She is not diaphoretic. No erythema. No pallor.  Psychiatric: She has a normal mood and affect. Her behavior is normal. Judgment and thought content normal.   Assessment & Plan:   There are no diagnoses linked to this encounter. I am having Monique [name] maintain her disopyramide, metoprolol tartrate, sertraline, mirtazapine, simvastatin, amLODipine, nitroGLYCERIN, and XARELTO.  No orders of the defined types were placed in this encounter.     Follow-up: No Follow-up on file.

## 2015-10-28 ENCOUNTER — Ambulatory Visit: Payer: Medicare Other

## 2015-10-28 VITALS — BP 142/78

## 2015-10-28 DIAGNOSIS — I10 Essential (primary) hypertension: Secondary | ICD-10-CM

## 2015-10-29 ENCOUNTER — Telehealth: Payer: Self-pay

## 2015-10-29 NOTE — Telephone Encounter (Signed)
Advised patient to continue meds as dr crawfords note says---patient repeated back for understanding

## 2015-10-29 NOTE — Telephone Encounter (Signed)
-----   Message from Hoyt Koch, MD sent at 10/29/2015  9:48 AM EST ----- Regarding: FW: bp recheck (5 days after amlodipine dosage change) Continue with 2 amlodipine pills daily.  ----- Message -----    From: Ander Slade, RN    Sent: 10/28/2015   4:10 PM      To: Hoyt Koch, MD Subject: bp recheck (5 days after amlodipine dosage c#  todays bp reading was 142/78--(much better than Friday)---please advise, i will call patient back, thanks

## 2015-11-16 ENCOUNTER — Other Ambulatory Visit: Payer: Self-pay

## 2015-11-16 MED ORDER — METOPROLOL TARTRATE 25 MG PO TABS: 25.0000 mg | ORAL_TABLET | Freq: Two times a day (BID) | ORAL | Status: DC

## 2015-11-18 ENCOUNTER — Other Ambulatory Visit (INDEPENDENT_AMBULATORY_CARE_PROVIDER_SITE_OTHER): Payer: Medicare Other

## 2015-11-18 ENCOUNTER — Encounter: Payer: Self-pay | Admitting: Internal Medicine

## 2015-11-18 ENCOUNTER — Ambulatory Visit (INDEPENDENT_AMBULATORY_CARE_PROVIDER_SITE_OTHER): Payer: Medicare Other | Admitting: Internal Medicine

## 2015-11-18 VITALS — BP 132/80 | HR 65 | Temp 98.0°F | Resp 20 | Wt 169.0 lb

## 2015-11-18 DIAGNOSIS — Z7901 Long term (current) use of anticoagulants: Secondary | ICD-10-CM | POA: Diagnosis not present

## 2015-11-18 DIAGNOSIS — R109 Unspecified abdominal pain: Secondary | ICD-10-CM | POA: Diagnosis not present

## 2015-11-18 DIAGNOSIS — K921 Melena: Secondary | ICD-10-CM

## 2015-11-18 DIAGNOSIS — I10 Essential (primary) hypertension: Secondary | ICD-10-CM

## 2015-11-18 HISTORY — DX: Melena: K92.1

## 2015-11-18 HISTORY — DX: Unspecified abdominal pain: R10.9

## 2015-11-18 LAB — URINALYSIS, ROUTINE W REFLEX MICROSCOPIC
Bilirubin Urine: NEGATIVE
KETONES UR: NEGATIVE
NITRITE: NEGATIVE
UROBILINOGEN UA: 0.2 (ref 0.0–1.0)
Urine Glucose: NEGATIVE
pH: 5.5 (ref 5.0–8.0)

## 2015-11-18 LAB — BASIC METABOLIC PANEL
BUN: 17 mg/dL (ref 6–23)
CO2: 30 mEq/L (ref 19–32)
Calcium: 9.6 mg/dL (ref 8.4–10.5)
Chloride: 105 mEq/L (ref 96–112)
Creatinine, Ser: 1.33 mg/dL — ABNORMAL HIGH (ref 0.40–1.20)
GFR: 49.67 mL/min — AB (ref >60.00)
GLUCOSE: 93 mg/dL (ref 70–99)
POTASSIUM: 3.4 meq/L — AB (ref 3.5–5.1)
Sodium: 144 mEq/L (ref 135–145)

## 2015-11-18 LAB — CBC WITH DIFFERENTIAL/PLATELET
Basophils Absolute: 0 10*3/uL (ref 0.0–0.1)
Basophils Relative: 0.3 % (ref 0.0–3.0)
EOS ABS: 0.1 10*3/uL (ref 0.0–0.7)
Eosinophils Relative: 0.7 % (ref 0.0–5.0)
HCT: 41.3 % (ref 36.0–46.0)
Hemoglobin: 13.7 g/dL (ref 12.0–15.0)
Lymphocytes Relative: 50.2 % — ABNORMAL HIGH (ref 12.0–46.0)
Lymphs Abs: 5.3 10*3/uL — ABNORMAL HIGH (ref 0.7–4.0)
MCHC: 33.2 g/dL (ref 30.0–36.0)
MCV: 94.1 fl (ref 78.0–100.0)
Monocytes Absolute: 0.7 10*3/uL (ref 0.1–1.0)
Monocytes Relative: 6.5 % (ref 3.0–12.0)
Neutro Abs: 4.5 10*3/uL (ref 1.4–7.7)
Neutrophils Relative %: 42.3 % — ABNORMAL LOW (ref 43.0–77.0)
Platelets: 192 10*3/uL (ref 150.0–400.0)
RBC: 4.39 Mil/uL (ref 3.87–5.11)
RDW: 13.8 % (ref 11.5–15.5)
WBC: 10.6 10*3/uL — AB (ref 4.0–10.5)

## 2015-11-18 LAB — PROTIME-INR
INR: 2 ratio — ABNORMAL HIGH (ref 0.8–1.0)
Prothrombin Time: 20.9 s — ABNORMAL HIGH (ref 9.6–13.1)

## 2015-11-18 LAB — HEPATIC FUNCTION PANEL
ALT: 8 U/L (ref 0–35)
AST: 17 U/L (ref 0–37)
Albumin: 4.1 g/dL (ref 3.5–5.2)
Alkaline Phosphatase: 95 U/L (ref 39–117)
Bilirubin, Direct: 0.1 mg/dL (ref 0.0–0.3)
TOTAL PROTEIN: 7.5 g/dL (ref 6.0–8.3)
Total Bilirubin: 0.6 mg/dL (ref 0.2–1.2)

## 2015-11-18 LAB — LIPASE: Lipase: 48 U/L (ref 11.0–59.0)

## 2015-11-18 NOTE — Assessment & Plan Note (Signed)
Relatively small volume by  Hx, none for 2 days, not orthostatic and does not appear acutely ill,  to stay on xarelto for now, check cbc and INR, also refer GI as I am not sure colonoscopy for diagnostic purposes may be completely out of the question

## 2015-11-18 NOTE — Progress Notes (Signed)
Pre visit review using our clinic review tool, if applicable. No additional management support is needed unless otherwise documented below in the visit note. 

## 2015-11-18 NOTE — Assessment & Plan Note (Signed)
Right mid and lateral abd, Temporally correllated with onset bleeding, mild tender today and near RUQ so will check limited GB u/s though possibly more likely related to right sided colon disease (? Diverticular); pt afeb, will check wbc, consider CT and/or antibx if wbc elevated

## 2015-11-18 NOTE — Patient Instructions (Signed)
Please continue all other medications as before, and refills have been done if requested.  Please have the pharmacy call with any other refills you may need.  Please keep your appointments with your specialists as you may have planned  You will be contacted regarding the referral for: Gastroenterology  Please go to the LAB in the Basement (turn left off the elevator) for the tests to be done today  You will be contacted by phone if any changes need to be made immediately.  Otherwise, you will receive a letter about your results with an explanation, but please check with MyChart first.  Please remember to sign up for MyChart if you have not done so, as this will be important to you in the future with finding out test results, communicating by private email, and scheduling acute appointments online when needed.    

## 2015-11-18 NOTE — Assessment & Plan Note (Signed)
High risk by hx for holding for any reason, cont xarelto for now,  to f/u any worsening symptoms or concerns

## 2015-11-18 NOTE — Assessment & Plan Note (Signed)
stable overall by history and exam, recent data reviewed with pt, and pt to continue medical treatment as before,  to f/u any worsening symptoms or concerns BP Readings from Last 3 Encounters:  11/18/15 132/80  10/28/15 142/78  10/23/15 164/96

## 2015-11-18 NOTE — Progress Notes (Signed)
Subjective:    Patient ID: Monique [name], female    DOB: 10-19-1935, 80 y.o.   MRN: [id number]  HPI  Here to f/u with c/o 2 episodes small volume BRB mixed in the stool oin sun and mon mar 12 and 13; none since then, no prior hx, on xarelto for bilat PE while on coumadin approx 1 yr, good compliance with meds.  Also mentions pain to right mid lateral/side pain mon mar 13 for overnight several hours, sharp, no radiation, no n/v, and Denies worsening other  abd pain, dysphagia; does have indigestion occas, takes prilosec otc prn.  No fever, wt loss.  Appetite ok.  Last colonoscopy x 1 was done years ago, but recalls Dr Harrington Challenger states she is non colonoscopy eligible due to not being able to hold the xarelto for the procedure. No orthostatic symtpoms, and Pt denies chest pain, increased sob or doe, wheezing, orthopnea, PND, increased LE swelling, palpitations, dizziness or syncope.  Pt denies new neurological symptoms such as new headache, or facial or extremity weakness or numbness  Pt denies polydipsia, polyuria. Has hx of constipation infreq in the past, none recent  Past Medical History  Diagnosis Date  . PAF (paroxysmal atrial fibrillation) ([place]) 2000  . LVH (left ventricular hypertrophy)   . Subarachnoid hemorrhage ([place]) 2000  . Hyperlipidemia   . HTN (hypertension)     Essential  . Chronic anticoagulation 06/2014    Coumadin started after bilateral PE  . PE (pulmonary embolism) 06/2014    Bilateral  . CHF (congestive heart failure) ([place])   . Heart murmur    Past Surgical History  Procedure Laterality Date  . Abdominal hysterectomy    . Cardiac catheterization  05/2008    Nl cors, no gradient, EF 60%    reports that she quit smoking about 48 years ago. She has never used smokeless tobacco. She reports that she does not drink alcohol or use illicit drugs. family history includes Arrhythmia in her mother, sister, and sister; Cancer in her mother; Heart disease in her mother; Stroke in  her mother. No Known Allergies Current Outpatient Prescriptions on File Prior to Visit  Medication Sig Dispense Refill  . amLODipine (NORVASC) 2.5 MG tablet Take 1 tablet (2.5 mg total) by mouth daily. 90 tablet 3  . disopyramide (NORPACE) 150 MG capsule Take 1 capsule (150 mg total) by mouth 2 (two) times daily. 60 capsule 11  . metoprolol tartrate (LOPRESSOR) 25 MG tablet Take 1 tablet (25 mg total) by mouth 2 (two) times daily. 180 tablet 2  . mirtazapine (REMERON) 15 MG tablet TAKE 1 TABLET (15 MG TOTAL) BY MOUTH AT BEDTIME. 30 tablet 1  . nitroGLYCERIN (NITROSTAT) 0.4 MG SL tablet Place 1 tablet (0.4 mg total) under the tongue every 5 (five) minutes as needed for chest pain. 25 tablet 3  . sertraline (ZOLOFT) 50 MG tablet TAKE 1 TABLET (50 MG TOTAL) BY MOUTH DAILY. 90 tablet 3  . simvastatin (ZOCOR) 40 MG tablet TAKE 1 TABLET (40 MG TOTAL) BY MOUTH AT BEDTIME. 90 tablet 3  . XARELTO 20 MG TABS tablet TAKE 1 TABLET (20 MG TOTAL) BY MOUTH DAILY WITH SUPPER. 30 tablet 6   No current facility-administered medications on file prior to visit.   Review of Systems  Constitutional: Negative for unusual diaphoresis or night sweats HENT: Negative for ringing in ear or discharge Eyes: Negative for double vision or worsening visual disturbance.  Respiratory: Negative for choking and stridor.   Gastrointestinal:  Negative for vomiting or other signifcant bowel change Genitourinary: Negative for hematuria or change in urine volume.  Musculoskeletal: Negative for other MSK pain or swelling Skin: Negative for color change and worsening wound.  Neurological: Negative for tremors and numbness other than noted  Psychiatric/Behavioral: Negative for decreased concentration or agitation other than above       Objective:   Physical Exam BP 132/80 mmHg  Pulse 65  Temp(Src) 98 F (36.7 C) (Oral)  Resp 20  Wt 169 lb (76.658 kg)  SpO2 94% VS noted, not ill appearing/non toxic Constitutional: Pt appears  in no significant distress HENT: Head: NCAT.  Right Ear: External ear normal.  Left Ear: External ear normal.  Eyes: . Pupils are equal, round, and reactive to light. Conjunctivae and EOM are normal Neck: Normal range of motion. Neck supple.  Cardiovascular: Normal rate and regular rhythm.   Pulmonary/Chest: Effort normal and breath sounds without rales or wheezing.  Abd:  Soft,, ND, + BS, very mild tender right mid and lateral abdomen, also other minor tender to epigastrium Neurological: Pt is alert. Not confused , motor grossly intact Skin: Skin is warm. No rash, no LE edema Psychiatric: Pt behavior is normal. No agitation.     Assessment & Plan:

## 2015-11-24 ENCOUNTER — Telehealth: Payer: Self-pay | Admitting: Internal Medicine

## 2015-11-24 DIAGNOSIS — I10 Essential (primary) hypertension: Secondary | ICD-10-CM

## 2015-11-24 MED ORDER — AMLODIPINE BESYLATE 5 MG PO TABS: 5.0000 mg | ORAL_TABLET | Freq: Every day | ORAL | Status: DC

## 2015-11-24 NOTE — Telephone Encounter (Signed)
Pt called stated Monique [name] increase the dosage of the amlodipine to twice a day now since her last ov in Feb 2016. Please help, pharmacy need [place] to let them know that we change the dosage.   Pt also check up on the referral for GI. Please check

## 2015-11-24 NOTE — Telephone Encounter (Signed)
The note from Lorane Gell states she was supposed to come back for BP recheck to determine if she needed BID. Will fill for 1 month of higher strength (5 mg once daily) and she needs visit back.

## 2015-11-25 ENCOUNTER — Encounter: Payer: Self-pay | Admitting: Internal Medicine

## 2015-11-25 NOTE — Telephone Encounter (Signed)
Patient aware and will schedule an office visit.  

## 2015-11-26 ENCOUNTER — Other Ambulatory Visit: Payer: Self-pay | Admitting: Internal Medicine

## 2015-12-01 NOTE — Telephone Encounter (Signed)
Pt scheduled with Dr. Henrene Pastor.

## 2015-12-02 ENCOUNTER — Ambulatory Visit
Admission: RE | Admit: 2015-12-02 | Discharge: 2015-12-02 | Disposition: A | Discharge status: Home / Self Care | Payer: Medicare Other | Source: Ambulatory Visit | Attending: Internal Medicine | Admitting: Internal Medicine

## 2015-12-02 ENCOUNTER — Encounter: Payer: Self-pay | Admitting: Internal Medicine

## 2015-12-02 DIAGNOSIS — R109 Unspecified abdominal pain: Secondary | ICD-10-CM | POA: Diagnosis not present

## 2015-12-02 DIAGNOSIS — Z7901 Long term (current) use of anticoagulants: Secondary | ICD-10-CM

## 2015-12-02 DIAGNOSIS — K921 Melena: Secondary | ICD-10-CM

## 2015-12-02 DIAGNOSIS — I10 Essential (primary) hypertension: Secondary | ICD-10-CM

## 2015-12-06 ENCOUNTER — Other Ambulatory Visit: Payer: Self-pay | Admitting: Internal Medicine

## 2015-12-22 ENCOUNTER — Ambulatory Visit: Payer: Medicare Other

## 2015-12-22 VITALS — BP 148/98

## 2015-12-22 DIAGNOSIS — I10 Essential (primary) hypertension: Secondary | ICD-10-CM

## 2015-12-25 ENCOUNTER — Other Ambulatory Visit: Payer: Self-pay

## 2015-12-25 MED ORDER — AMLODIPINE BESYLATE 5 MG PO TABS: 5.0000 mg | ORAL_TABLET | Freq: Every day | ORAL | Status: DC

## 2015-12-25 NOTE — Telephone Encounter (Signed)
Per dr Sharlet Salina, patient is to stay on 5mg  amodipine, refill sent to patient's pharm

## 2016-01-01 ENCOUNTER — Other Ambulatory Visit: Payer: Self-pay | Admitting: Internal Medicine

## 2016-01-01 ENCOUNTER — Ambulatory Visit (INDEPENDENT_AMBULATORY_CARE_PROVIDER_SITE_OTHER): Payer: Medicare Other | Admitting: Family Medicine

## 2016-01-01 VITALS — BP 122/72 | HR 75 | Temp 98.6°F | Resp 17 | Ht 66.5 in | Wt 169.0 lb

## 2016-01-01 DIAGNOSIS — N3091 Cystitis, unspecified with hematuria: Secondary | ICD-10-CM

## 2016-01-01 DIAGNOSIS — R1032 Left lower quadrant pain: Secondary | ICD-10-CM

## 2016-01-01 DIAGNOSIS — R300 Dysuria: Secondary | ICD-10-CM | POA: Diagnosis not present

## 2016-01-01 DIAGNOSIS — M542 Cervicalgia: Secondary | ICD-10-CM | POA: Diagnosis not present

## 2016-01-01 DIAGNOSIS — N3090 Cystitis, unspecified without hematuria: Secondary | ICD-10-CM | POA: Diagnosis not present

## 2016-01-01 DIAGNOSIS — R52 Pain, unspecified: Secondary | ICD-10-CM

## 2016-01-01 LAB — POCT URINALYSIS DIP (MANUAL ENTRY)
Bilirubin, UA: NEGATIVE
Glucose, UA: NEGATIVE
Nitrite, UA: NEGATIVE
Protein Ur, POC: 30 — AB
SPEC GRAV UA: 1.025
Urobilinogen, UA: 0.2
pH, UA: 5.5

## 2016-01-01 LAB — POCT CBC
GRANULOCYTE PERCENT: 55.6 % (ref 37–80)
HEMATOCRIT: 40.1 % (ref 37.7–47.9)
HEMOGLOBIN: 14 g/dL (ref 12.2–16.2)
Lymph, poc: 3.9 — AB (ref 0.6–3.4)
MCH, POC: 32 pg — AB (ref 27–31.2)
MCHC: 34.8 g/dL (ref 31.8–35.4)
MCV: 91.9 fL (ref 80–97)
MID (cbc): 0.6 (ref 0–0.9)
MPV: 7.7 fL (ref 0–99.8)
POC GRANULOCYTE: 5.6 (ref 2–6.9)
POC LYMPH PERCENT: 38.6 %L (ref 10–50)
POC MID %: 5.8 %M (ref 0–12)
Platelet Count, POC: 193 10*3/uL (ref 142–424)
RBC: 4.37 M/uL (ref 4.04–5.48)
RDW, POC: 14.5 %
WBC: 10 10*3/uL (ref 4.6–10.2)

## 2016-01-01 LAB — POC MICROSCOPIC URINALYSIS (UMFC): MUCUS RE: ABSENT

## 2016-01-01 MED ORDER — CEPHALEXIN 500 MG PO CAPS: 500.0000 mg | ORAL_CAPSULE | Freq: Two times a day (BID) | ORAL | Status: DC

## 2016-01-01 NOTE — Patient Instructions (Addendum)
IF you received an x-ray today, you will receive an invoice from [facility]. Please contact [facility] at [phone] with questions or concerns regarding your invoice.   IF you received labwork today, you will receive an invoice from [employer]. Please contact [facility] at [phone] with questions or concerns regarding your invoice.   Our billing staff will not be able to assist you with questions regarding bills from these companies.  You will be contacted with the lab results as soon as they are available. The fastest way to get your results is to activate your My Chart account. Instructions are located on the last page of this paperwork. If you have not heard from [place] regarding the results in 2 weeks, please contact this office.     Your urine test does show some signs of infection, but will check a urine culture to make sure. In the meantime you can go ahead and start an antibiotic to cover for infection since you are having urinary tract infection symptoms.  Your neck pain and body aches may be in response to infection or may be due to other muscle aches or arthritis.  You can take Tylenol over-the-counter as needed, start the antibiotic, and if any fever, increasing abdominal pain, or other worsening the next few days, recommend recheck here or your other care provider.   Urinary Tract Infection Urinary tract infections (UTIs) can develop anywhere along your urinary tract. Your urinary tract is your body's drainage system for removing wastes and extra water. Your urinary tract includes two kidneys, two ureters, a bladder, and a urethra. Your kidneys are a pair of bean-shaped organs. Each kidney is about the size of your fist. They are located below your ribs, one on each side of your spine. CAUSES Infections are caused by microbes, which are microscopic organisms, including fungi, viruses, and bacteria. These organisms are so small that  they can only be seen through a microscope. Bacteria are the microbes that most commonly cause UTIs. SYMPTOMS  Symptoms of UTIs may vary by age and gender of the patient and by the location of the infection. Symptoms in young women typically include a frequent and intense urge to urinate and a painful, burning feeling in the bladder or urethra during urination. Older women and men are more likely to be tired, shaky, and weak and have muscle aches and abdominal pain. A fever may mean the infection is in your kidneys. Other symptoms of a kidney infection include pain in your back or sides below the ribs, nausea, and vomiting. DIAGNOSIS To diagnose a UTI, your caregiver will ask you about your symptoms. Your caregiver will also ask you to provide a urine sample. The urine sample will be tested for bacteria and white blood cells. White blood cells are made by your body to help fight infection. TREATMENT  Typically, UTIs can be treated with medication. Because most UTIs are caused by a bacterial infection, they usually can be treated with the use of antibiotics. The choice of antibiotic and length of treatment depend on your symptoms and the type of bacteria causing your infection. HOME CARE INSTRUCTIONS  If you were prescribed antibiotics, take them exactly as your caregiver instructs you. Finish the medication even if you feel better after you have only taken some of the medication.  Drink enough water and fluids to keep your urine clear or pale yellow.  Avoid caffeine, tea, and carbonated beverages. They tend to irritate your bladder.  Empty your bladder often. Avoid holding urine for long periods of time.  Empty your bladder before and after sexual intercourse.  After a bowel movement, women should cleanse from front to back. Use each tissue only once. SEEK MEDICAL CARE IF:   You have back pain.  You develop a fever.  Your symptoms do not begin to resolve within 3 days. SEEK IMMEDIATE  MEDICAL CARE IF:   You have severe back pain or lower abdominal pain.  You develop chills.  You have nausea or vomiting.  You have continued burning or discomfort with urination. MAKE SURE YOU:   Understand these instructions.  Will watch your condition.  Will get help right away if you are not doing well or get worse.   This information is not intended to replace advice given to you by your health care provider. Make sure you discuss any questions you have with your health care provider.   Document Released: 06/01/2005 Document Revised: 05/13/2015 Document Reviewed: 09/30/2011 Elsevier Interactive Patient Education 2016 Elsevier Inc.  Abdominal Pain, Adult Many things can cause abdominal pain. Usually, abdominal pain is not caused by a disease and will improve without treatment. It can often be observed and treated at home. Your health care provider will do a physical exam and possibly order blood tests and X-rays to help determine the seriousness of your pain. However, in many cases, more time must pass before a clear cause of the pain can be found. Before that point, your health care provider may not know if you need more testing or further treatment. HOME CARE INSTRUCTIONS Monitor your abdominal pain for any changes. The following actions may help to alleviate any discomfort you are experiencing:  Only take over-the-counter or prescription medicines as directed by your health care provider.  Do not take laxatives unless directed to do so by your health care provider.  Try a clear liquid diet (broth, tea, or water) as directed by your health care provider. Slowly move to a bland diet as tolerated. SEEK MEDICAL CARE IF:  You have unexplained abdominal pain.  You have abdominal pain associated with nausea or diarrhea.  You have pain when you urinate or have a bowel movement.  You experience abdominal pain that wakes you in the night.  You have abdominal pain that is  worsened or improved by eating food.  You have abdominal pain that is worsened with eating fatty foods.  You have a fever. SEEK IMMEDIATE MEDICAL CARE IF:  Your pain does not go away within 2 hours.  You keep throwing up (vomiting).  Your pain is felt only in portions of the abdomen, such as the right side or the left lower portion of the abdomen.  You pass bloody or black tarry stools. MAKE SURE YOU:  Understand these instructions.  Will watch your condition.  Will get help right away if you are not doing well or get worse.   This information is not intended to replace advice given to you by your health care provider. Make sure you discuss any questions you have with your health care provider.   Document Released: 06/01/2005 Document Revised: 05/13/2015 Document Reviewed: 05/01/2013 Elsevier Interactive Patient Education [employer].

## 2016-01-01 NOTE — Progress Notes (Signed)
Subjective:  By signing my name below, I, Raven Small, attest that this documentation has been prepared under the direction and in the presence of Merri Ray, MD.  Electronically Signed: Thea Alken, ED Scribe. 01/01/2016. 2:39 PM.   Patient ID: Monique [name], female    DOB: [birth year], 80 y.o.   MRN: [id number]  HPI Chief Complaint  Patient presents with  . Back Pain    with no traumas   . Neck Pain   HPI Comments: Monique [name] is a 80 y.o. female who presents to the [facility] complaining of back pain and neck pain. Hx of HTN chronic kidney disease, HLD, fibromuscular dysplasia, afib, hypertrophic cardiomyopathy. Pt is on chronic anticoagulation.   Pt complains of improving, aching low back and posterior neck that began last night. She reports pain has improved since last night after applying muscle rub. She's also had dysuria this morning and cough 2 nights ago but none today.  She denies fever but felt warm last night. She denies urinary frequency and hematuria.   Pt notes intermittent abdominal pain for 1 month. She reports a possible hx of diverticulitis but is unsure.     Patient Active Problem List   Diagnosis Date Noted  . Hematochezia 11/18/2015  . Abdominal pain 11/18/2015  . Long term current use of anticoagulant therapy 11/18/2015  . HA (headache) 10/23/2015  . Benign essential HTN 10/23/2015  . Urinary frequency 05/06/2015  . Abdominal discomfort in right lower quadrant 04/24/2015  . DVT of lower extremity (deep venous thrombosis) ([place]) 11/28/2014  . Chronic kidney disease, stage III (moderate) 11/21/2014  . Pulmonary embolism, bilateral ([place]) 06/27/2014  . History of fibromuscular dysplasia 06/27/2014  . Cerebral aneurysm 2000 06/27/2014  . Gastritis 05/16/2012  . Hyperlipidemia 05/20/2008  . Essential hypertension 05/20/2008  . Hypertrophic obstructive cardiomyopathy (HOCM) ([place]) 05/20/2008  . PAROXYSMAL ATRIAL FIBRILLATION  05/20/2008  . Subarachnoid hemorrhage 2000 05/20/2008   Past Medical History  Diagnosis Date  . PAF (paroxysmal atrial fibrillation) ([place]) 2000  . LVH (left ventricular hypertrophy)   . Subarachnoid hemorrhage ([place]) 2000  . Hyperlipidemia   . HTN (hypertension)     Essential  . Chronic anticoagulation 06/2014    Coumadin started after bilateral PE  . PE (pulmonary embolism) 06/2014    Bilateral  . CHF (congestive heart failure) ([place])   . Heart murmur    Past Surgical History  Procedure Laterality Date  . Abdominal hysterectomy    . Cardiac catheterization  05/2008    Nl cors, no gradient, EF 60%   No Known Allergies Prior to Admission medications   Medication Sig Start Date End Date Taking? Authorizing Provider  amLODipine (NORVASC) 5 MG tablet Take 1 tablet (5 mg total) by mouth daily. 12/25/15  Yes Hoyt Koch, MD  disopyramide (NORPACE) 150 MG capsule TAKE 1 CAPSULE (150 MG TOTAL) BY MOUTH 2 (TWO) TIMES DAILY. 01/01/16  Yes Fay Records, MD  metoprolol tartrate (LOPRESSOR) 25 MG tablet Take 1 tablet (25 mg total) by mouth 2 (two) times daily. 11/16/15  Yes Fay Records, MD  mirtazapine (REMERON) 15 MG tablet TAKE 1 TABLET (15 MG TOTAL) BY MOUTH AT BEDTIME. 11/26/15  Yes Hoyt Koch, MD  nitroGLYCERIN (NITROSTAT) 0.4 MG SL tablet Place 1 tablet (0.4 mg total) under the tongue every 5 (five) minutes as needed for chest pain. 03/30/15  Yes Fay Records, MD  sertraline (ZOLOFT) 50 MG tablet TAKE 1 TABLET (50  MG TOTAL) BY MOUTH DAILY. 02/24/15  Yes Hoyt Koch, MD  simvastatin (ZOCOR) 40 MG tablet TAKE 1 TABLET (40 MG TOTAL) BY MOUTH AT BEDTIME. 03/30/15  Yes Fay Records, MD  XARELTO 20 MG TABS tablet TAKE 1 TABLET (20 MG TOTAL) BY MOUTH DAILY WITH SUPPER. 09/08/15  Yes Hoyt Koch, MD   Social History   Social History  . Marital Status: Divorced    Spouse Name: N/A  . Number of Children: N/A  . Years of Education: 12   Occupational History  .  Retired [employer]    Social History Main Topics  . Smoking status: Former Smoker    Quit date: 09/06/1967  . Smokeless tobacco: Never Used  . Alcohol Use: No  . Drug Use: No  . Sexual Activity: Not Currently   Other Topics Concern  . Not on file   Social History Narrative   HSG. Married '61 - '88/divorced. 3 sons - '74, '59, '62, 1 daughter '63; 5 grandchildren. Lives alone in two story home. She does not drive.   Retired from [employer].   Review of Systems  Constitutional: Negative for fever.  Respiratory: Positive for cough.   Gastrointestinal: Positive for abdominal pain.  Genitourinary: Positive for dysuria. Negative for frequency and hematuria.  Musculoskeletal: Positive for myalgias, back pain and neck pain.    Objective:   Physical Exam  Constitutional: She is oriented to person, place, and time. She appears well-developed and well-nourished.  HENT:  Head: Normocephalic and atraumatic.  Eyes: Conjunctivae and EOM are normal. Pupils are equal, round, and reactive to light.  Neck: Carotid bruit is not present.  Cardiovascular: Normal rate, regular rhythm, normal heart sounds and intact distal pulses.  Exam reveals no gallop and no friction rub.   No murmur heard. Pulmonary/Chest: Effort normal and breath sounds normal.  Abdominal: Soft. She exhibits no pulsatile midline mass. There is tenderness (mild) in the suprapubic area and left lower quadrant.  Musculoskeletal:  Neck minimal tenderness over left paraspinal and trapezius. Decrease ROM of neck but equal.  No CVA tender. Lumbar spine non tender.   Neurological: She is alert and oriented to person, place, and time.  Skin: Skin is warm and dry.  Psychiatric: She has a normal mood and affect. Her behavior is normal.  Vitals reviewed.    Filed Vitals:   01/01/16 1413  BP: 122/72  Pulse: 75  Temp: 98.6 F (37 C)  TempSrc: Oral  Resp: 17  Height: 5' 6.5" (1.689 m)  Weight: 169 lb  (76.658 kg)  SpO2: 95%    Results for orders placed or performed in visit on 01/01/16  POCT CBC  Result Value Ref Range   WBC 10.0 4.6 - 10.2 K/uL   Lymph, poc 3.9 (A) 0.6 - 3.4   POC LYMPH PERCENT 38.6 10 - 50 %L   MID (cbc) 0.6 0 - 0.9   POC MID % 5.8 0 - 12 %M   POC Granulocyte 5.6 2 - 6.9   Granulocyte percent 55.6 37 - 80 %G   RBC 4.37 4.04 - 5.48 M/uL   Hemoglobin 14.0 12.2 - 16.2 g/dL   HCT, POC 40.1 37.7 - 47.9 %   MCV 91.9 80 - 97 fL   MCH, POC 32.0 (A) 27 - 31.2 pg   MCHC 34.8 31.8 - 35.4 g/dL   RDW, POC 14.5 %   Platelet Count, POC 193 142 - 424 K/uL   MPV 7.7 0 -  99.8 fL  POCT urinalysis dipstick  Result Value Ref Range   Color, UA yellow yellow   Clarity, UA clear clear   Glucose, UA negative negative   Bilirubin, UA negative negative   Ketones, POC UA trace (5) (A) negative   Spec Grav, UA 1.025    Blood, UA moderate (A) negative   pH, UA 5.5    Protein Ur, POC =30 (A) negative   Urobilinogen, UA 0.2    Nitrite, UA Negative Negative   Leukocytes, UA small (1+) (A) Negative  POCT Microscopic Urinalysis ([facility])  Result Value Ref Range   WBC,UR,HPF,POC Moderate (A) None WBC/hpf   RBC,UR,HPF,POC Moderate (A) None RBC/hpf   Bacteria None None, Too numerous to count   Mucus Absent Absent   Epithelial Cells, UR Per Microscopy Few (A) None, Too numerous to count cells/hpf    Assessment & Plan:   Monique [name] is a 80 y.o. female Dysuria - Plan: POCT CBC, POCT urinalysis dipstick, POCT Microscopic Urinalysis ([facility]), cephALEXin (KEFLEX) 500 MG capsule Left lower quadrant pain - Plan: POCT CBC Hemorrhagic cystitis - Plan: cephALEXin (KEFLEX) 500 MG capsule, Urine culture  - Suspected early UTI/hemorrhagic cystitis. Start Keflex, check urine culture, RTC/ER precautions. Continue follow-up as planned for intermittent abdominal pain.  Body aches Neck pain  -May be due to myalgias due to infection above, versus underlying osteoarthritis and muscle spasm.    -Tylenol over-the-counter as on chronic anticoagulation, heat or ice, over-the-counter topical treatments okay. RTC precautions if persists.  Meds ordered this encounter  Medications  . cephALEXin (KEFLEX) 500 MG capsule    Sig: Take 1 capsule (500 mg total) by mouth 2 (two) times daily.    Dispense:  20 capsule    Refill:  0   Patient Instructions       IF you received an x-ray today, you will receive an invoice from [facility]. Please contact [facility] at [phone] with questions or concerns regarding your invoice.   IF you received labwork today, you will receive an invoice from [employer]. Please contact [facility] at [phone] with questions or concerns regarding your invoice.   Our billing staff will not be able to assist you with questions regarding bills from these companies.  You will be contacted with the lab results as soon as they are available. The fastest way to get your results is to activate your My Chart account. Instructions are located on the last page of this paperwork. If you have not heard from [place] regarding the results in 2 weeks, please contact this office.     Your urine test does show some signs of infection, but will check a urine culture to make sure. In the meantime you can go ahead and start an antibiotic to cover for infection since you are having urinary tract infection symptoms.  Your neck pain and body aches may be in response to infection or may be due to other muscle aches or arthritis.  You can take Tylenol over-the-counter as needed, start the antibiotic, and if any fever, increasing abdominal pain, or other worsening the next few days, recommend recheck here or your other care provider.   Urinary Tract Infection Urinary tract infections (UTIs) can develop anywhere along your urinary tract. Your urinary tract is your body's drainage system for removing wastes and extra water. Your urinary tract  includes two kidneys, two ureters, a bladder, and a urethra. Your kidneys are a pair of bean-shaped organs. Each kidney is about  the size of your fist. They are located below your ribs, one on each side of your spine. CAUSES Infections are caused by microbes, which are microscopic organisms, including fungi, viruses, and bacteria. These organisms are so small that they can only be seen through a microscope. Bacteria are the microbes that most commonly cause UTIs. SYMPTOMS  Symptoms of UTIs may vary by age and gender of the patient and by the location of the infection. Symptoms in young women typically include a frequent and intense urge to urinate and a painful, burning feeling in the bladder or urethra during urination. Older women and men are more likely to be tired, shaky, and weak and have muscle aches and abdominal pain. A fever may mean the infection is in your kidneys. Other symptoms of a kidney infection include pain in your back or sides below the ribs, nausea, and vomiting. DIAGNOSIS To diagnose a UTI, your caregiver will ask you about your symptoms. Your caregiver will also ask you to provide a urine sample. The urine sample will be tested for bacteria and white blood cells. White blood cells are made by your body to help fight infection. TREATMENT  Typically, UTIs can be treated with medication. Because most UTIs are caused by a bacterial infection, they usually can be treated with the use of antibiotics. The choice of antibiotic and length of treatment depend on your symptoms and the type of bacteria causing your infection. HOME CARE INSTRUCTIONS  If you were prescribed antibiotics, take them exactly as your caregiver instructs you. Finish the medication even if you feel better after you have only taken some of the medication.  Drink enough water and fluids to keep your urine clear or pale yellow.  Avoid caffeine, tea, and carbonated beverages. They tend to irritate your bladder.  Empty  your bladder often. Avoid holding urine for long periods of time.  Empty your bladder before and after sexual intercourse.  After a bowel movement, women should cleanse from front to back. Use each tissue only once. SEEK MEDICAL CARE IF:   You have back pain.  You develop a fever.  Your symptoms do not begin to resolve within 3 days. SEEK IMMEDIATE MEDICAL CARE IF:   You have severe back pain or lower abdominal pain.  You develop chills.  You have nausea or vomiting.  You have continued burning or discomfort with urination. MAKE SURE YOU:   Understand these instructions.  Will watch your condition.  Will get help right away if you are not doing well or get worse.   This information is not intended to replace advice given to you by your health care provider. Make sure you discuss any questions you have with your health care provider.   Document Released: 06/01/2005 Document Revised: 05/13/2015 Document Reviewed: 09/30/2011 Elsevier Interactive Patient Education 2016 Elsevier Inc.  Abdominal Pain, Adult Many things can cause abdominal pain. Usually, abdominal pain is not caused by a disease and will improve without treatment. It can often be observed and treated at home. Your health care provider will do a physical exam and possibly order blood tests and X-rays to help determine the seriousness of your pain. However, in many cases, more time must pass before a clear cause of the pain can be found. Before that point, your health care provider may not know if you need more testing or further treatment. HOME CARE INSTRUCTIONS Monitor your abdominal pain for any changes. The following actions may help to alleviate any discomfort you are  experiencing:  Only take over-the-counter or prescription medicines as directed by your health care provider.  Do not take laxatives unless directed to do so by your health care provider.  Try a clear liquid diet (broth, tea, or water) as  directed by your health care provider. Slowly move to a bland diet as tolerated. SEEK MEDICAL CARE IF:  You have unexplained abdominal pain.  You have abdominal pain associated with nausea or diarrhea.  You have pain when you urinate or have a bowel movement.  You experience abdominal pain that wakes you in the night.  You have abdominal pain that is worsened or improved by eating food.  You have abdominal pain that is worsened with eating fatty foods.  You have a fever. SEEK IMMEDIATE MEDICAL CARE IF:  Your pain does not go away within 2 hours.  You keep throwing up (vomiting).  Your pain is felt only in portions of the abdomen, such as the right side or the left lower portion of the abdomen.  You pass bloody or black tarry stools. MAKE SURE YOU:  Understand these instructions.  Will watch your condition.  Will get help right away if you are not doing well or get worse.   This information is not intended to replace advice given to you by your health care provider. Make sure you discuss any questions you have with your health care provider.   Document Released: 06/01/2005 Document Revised: 05/13/2015 Document Reviewed: 05/01/2013 Elsevier Interactive Patient Education [employer].     I personally performed the services described in this documentation, which was scribed in my presence. The recorded information has been reviewed and considered, and addended by me as needed.

## 2016-01-02 LAB — URINE CULTURE: Colony Count: 50000

## 2016-01-15 ENCOUNTER — Ambulatory Visit (INDEPENDENT_AMBULATORY_CARE_PROVIDER_SITE_OTHER): Payer: Medicare Other | Admitting: Internal Medicine

## 2016-01-15 ENCOUNTER — Encounter: Payer: Self-pay | Admitting: *Deleted

## 2016-01-15 ENCOUNTER — Telehealth: Payer: Self-pay

## 2016-01-15 ENCOUNTER — Encounter: Payer: Self-pay | Admitting: Internal Medicine

## 2016-01-15 VITALS — BP 142/90 | HR 64 | Ht 65.0 in | Wt 171.5 lb

## 2016-01-15 DIAGNOSIS — R1084 Generalized abdominal pain: Secondary | ICD-10-CM | POA: Diagnosis not present

## 2016-01-15 DIAGNOSIS — Z7901 Long term (current) use of anticoagulants: Secondary | ICD-10-CM

## 2016-01-15 DIAGNOSIS — K5909 Other constipation: Secondary | ICD-10-CM | POA: Diagnosis not present

## 2016-01-15 DIAGNOSIS — K625 Hemorrhage of anus and rectum: Secondary | ICD-10-CM | POA: Diagnosis not present

## 2016-01-15 MED ORDER — NA SULFATE-K SULFATE-MG SULF 17.5-3.13-1.6 GM/177ML PO SOLN: 1.0000 | Freq: Once | ORAL | Status: DC

## 2016-01-15 NOTE — Progress Notes (Signed)
HISTORY OF PRESENT ILLNESS:  Monique [name] is a 80 y.o. female with multiple medical problems including hypertension, hyperlipidemia, history of congestive heart failure (last ejection fraction 65%), and pulmonary embolus for which she is on chronic Xarelto therapy. The patient is referred today by her primary care provider Dr. Jenny Reichmann regarding rectal bleeding. Patient reports approximately 2 months ago she developed bright red blood with defecation on several occasions. There was no associated rectal pain. She has been having some problems with vague lower abdominal discomfort and somewhat constipated bowels for which she is now on stool softener. She denies weight loss. She denies having ever had colonoscopy. Her chronic medical problems are stable. She continues on her blood thinner.  REVIEW OF SYSTEMS:  All non-GI ROS negative except for heart murmur, excessive urination  Past Medical History  Diagnosis Date  . PAF (paroxysmal atrial fibrillation) ([place]) 2000  . LVH (left ventricular hypertrophy)   . Subarachnoid hemorrhage ([place]) 2000  . Hyperlipidemia   . HTN (hypertension)     Essential  . Chronic anticoagulation 06/2014    Coumadin started after bilateral PE  . PE (pulmonary embolism) 06/2014    Bilateral  . CHF (congestive heart failure) ([place])   . Heart murmur   . Fatty liver   . Kidney stones     Past Surgical History  Procedure Laterality Date  . Abdominal hysterectomy  2000  . Cardiac catheterization  05/2008    Nl cors, no gradient, EF 60%    Social History Monique [name]  reports that she quit smoking about 48 years ago. She has never used smokeless tobacco. She reports that she does not drink alcohol or use illicit drugs.  family history includes Arrhythmia in her mother and sister; Breast cancer in her daughter and mother; Heart disease in her mother; Stroke in her mother.  No Known Allergies     PHYSICAL EXAMINATION: Vital signs: BP 142/90 mmHg  Pulse 64   Ht 5\' 5"  (1.651 m)  Wt 171 lb 8 oz (77.792 kg)  BMI 28.54 kg/m2  Constitutional: generally well-appearing, no acute distress Psychiatric: alert and oriented x3, cooperative Eyes: extraocular movements intact, anicteric, conjunctiva pink Mouth: oral pharynx moist, no lesions Neck: supple no lymphadenopathy Cardiovascular: heart regular rate and rhythm,Systolic murmur present Lungs: clear to auscultation bilaterally Abdomen: soft, nontender, nondistended, no obvious ascites, no peritoneal signs, normal bowel sounds, no organomegaly Rectal: Deferred until colonoscopy Extremities: no lower extremity edema bilaterally Skin: no lesions on visible extremities Neuro: No focal deficits. Normal DTRs  ASSESSMENT:  #1. Transient rectal bleeding of uncertain etiology. Rule out neoplasia #2. Recent problems with vague lower abdominal discomfort and constipation. These may be related #3. Multiple medical problems including the need for chronic anti-coagulation  PLAN:  #1. Colonoscopy to evaluate change in bowel habits, rectal bleeding, and lower abdominal discomfort. The patient is high-risk given her age, comorbidities, and the need to address any coagulation therapy.The nature of the procedure, as well as the risks, benefits, and alternatives were carefully and thoroughly reviewed with the patient. Ample time for discussion and questions allowed. The patient understood, was satisfied, and agreed to proceed. #2. Hold Xarelto 3 days prior to procedure. We will confirm with Dr. Harrington Challenger, her cardiologist, if this is clinically acceptable.  A copy of this consultation and has been sent to Dr. Jenny Reichmann, Dr. Sharlet Salina and Dr. Harrington Challenger

## 2016-01-15 NOTE — Telephone Encounter (Signed)
  01/15/2016   RE: Monique [name] DOB: October 12, 1935 MRN: [id number]   Dear Dr. Harrington Challenger,    We have scheduled the above patient for an endoscopic procedure. Our records show that she is on anticoagulation therapy.   Please advise as to how long the patient may come off her therapy of Xarelto prior to the procedure, which is scheduled for 01/26/2016.  Please fax back/ or route the completed form to [facility] at [phone].   Sincerely,    Phillis Haggis

## 2016-01-15 NOTE — Patient Instructions (Signed)
You have been scheduled for a colonoscopy. Please follow written instructions given to you at your visit today.  Please pick up your prep supplies at the pharmacy within the next 1-3 days. If you use inhalers (even only as needed), please bring them with you on the day of your procedure.   

## 2016-01-15 NOTE — Telephone Encounter (Signed)
Patient is at high risk for coming off of Xarelto She has had recurrent PE, one while on coumadin I will discuss with anticoag clinic best recommendations.

## 2016-01-18 NOTE — Telephone Encounter (Signed)
Dr. Perry, please advise. °

## 2016-01-18 NOTE — Telephone Encounter (Signed)
Given her high clot risk, would see if patient can undergo endoscopy on anticoagulation.  If this is not possible, recommend holding only 24 hours and resuming ASAP.

## 2016-01-18 NOTE — Telephone Encounter (Signed)
Magda Paganini, Based on information provided, the procedure, and the reason for the procedure, hold Xarelto 24 hours prior to the procedure. Do not take dosage day before or day of exam. Would hope to resume that day postprocedure

## 2016-01-19 NOTE — Telephone Encounter (Signed)
Spoke with patient and told her that per Dr. Harrington Challenger she could hold her Xarelto for 24 hours prior to the procedure.  She is not to take it the day before or the day of the procedure.  Patient expressed understanding

## 2016-01-26 ENCOUNTER — Ambulatory Visit (AMBULATORY_SURGERY_CENTER): Payer: Medicare Other | Admitting: Internal Medicine

## 2016-01-26 ENCOUNTER — Encounter: Payer: Self-pay | Admitting: Internal Medicine

## 2016-01-26 VITALS — BP 139/81 | HR 58 | Temp 97.8°F | Resp 13 | Ht 65.0 in | Wt 171.0 lb

## 2016-01-26 DIAGNOSIS — E669 Obesity, unspecified: Secondary | ICD-10-CM | POA: Diagnosis not present

## 2016-01-26 DIAGNOSIS — K649 Unspecified hemorrhoids: Secondary | ICD-10-CM

## 2016-01-26 DIAGNOSIS — I4891 Unspecified atrial fibrillation: Secondary | ICD-10-CM | POA: Diagnosis not present

## 2016-01-26 DIAGNOSIS — K625 Hemorrhage of anus and rectum: Secondary | ICD-10-CM

## 2016-01-26 DIAGNOSIS — D123 Benign neoplasm of transverse colon: Secondary | ICD-10-CM

## 2016-01-26 DIAGNOSIS — I509 Heart failure, unspecified: Secondary | ICD-10-CM | POA: Diagnosis not present

## 2016-01-26 DIAGNOSIS — F329 Major depressive disorder, single episode, unspecified: Secondary | ICD-10-CM | POA: Diagnosis not present

## 2016-01-26 DIAGNOSIS — I10 Essential (primary) hypertension: Secondary | ICD-10-CM | POA: Diagnosis not present

## 2016-01-26 MED ORDER — SODIUM CHLORIDE 0.9 % IV SOLN: 500.0000 mL | INTRAVENOUS | Status: DC

## 2016-01-26 NOTE — Op Note (Signed)
[name] Procedure Date: 01/26/2016 8:41 AM MRN: [id number] Endoscopist: Docia Chuck. Henrene Pastor , MD Age: 80 Referring MD:  Date of Birth: 09/19/1935 Gender: Female Procedure:                Colonoscopy, with snare polypectomy x 2 Indications:              Rectal bleeding Medicines:                Monitored Anesthesia Care Procedure:                Pre-Anesthesia Assessment:                           - Prior to the procedure, a History and Physical                            was performed, and patient medications and                            allergies were reviewed. The patient's tolerance of                            previous anesthesia was also reviewed. The risks                            and benefits of the procedure and the sedation                            options and risks were discussed with the patient.                            All questions were answered, and informed consent                            was obtained. Prior Anticoagulants: The patient has                            taken Xarelto (rivaroxaban), last dose was 2 days                            prior to procedure. ASA Grade Assessment: III - A                            patient with severe systemic disease. After                            reviewing the risks and benefits, the patient was                            deemed in satisfactory condition to undergo the                            procedure.  After obtaining informed consent, the colonoscope                            was passed under direct vision. Throughout the                            procedure, the patient's blood pressure, pulse, and                            oxygen saturations were monitored continuously. The                            Model CF-HQ190L [id number]) scope was introduced                            through the anus and advanced to the the cecum,    identified by appendiceal orifice and ileocecal                            valve. The ileocecal valve, appendiceal orifice,                            and rectum were photographed. The quality of the                            bowel preparation was excellent. The colonoscopy                            was performed without difficulty. The patient                            tolerated the procedure well. The bowel preparation                            used was SUPREP. Scope In: 8:49:39 AM Scope Out: 9:00:17 AM Scope Withdrawal Time: 0 hours 8 minutes 42 seconds  Total Procedure Duration: 0 hours 10 minutes 38 seconds  Findings:                 Two polyps were found in the transverse colon. The                            polyps were 3 to 7 mm in size. These polyps were                            removed with a cold snare. Resection and retrieval                            were complete.                           Internal hemorrhoids were found during                            retroflexion. The hemorrhoids  were medium-sized.                           The exam was otherwise without abnormality on                            direct and retroflexion views. Complications:            No immediate complications. Estimated blood loss:                            None. Estimated Blood Loss:     Estimated blood loss: none. Impression:               - Two 3 to 7 mm polyps in the transverse colon,                            removed with a cold snare. Resected and retrieved.                           - Internal hemorrhoids.                           - The examination was otherwise normal on direct                            and retroflexion views. Recommendation:           - Repeat colonoscopy is not recommended for                            surveillance.                           - Resume Xarelto (rivaroxaban) today at prior dose.                           - Patient has a contact number available  for                            emergencies. The signs and symptoms of potential                            delayed complications were discussed with the                            patient. Return to normal activities tomorrow.                            Written discharge instructions were provided to the                            patient.                           - Resume previous diet.                           -  Continue present medications.                           - Await pathology results. Docia Chuck. Henrene Pastor, MD 01/26/2016 9:05:32 AM This report has been signed electronically.

## 2016-01-26 NOTE — Progress Notes (Signed)
Called to room to assist during endoscopic procedure.  Patient ID and intended procedure confirmed with present staff. Received instructions for my participation in the procedure from the performing physician.  

## 2016-01-26 NOTE — Patient Instructions (Signed)
YOU HAD AN ENDOSCOPIC PROCEDURE TODAY AT [facility]:   Refer to the procedure report that was given to you for any specific questions about what was found during the examination.  If the procedure report does not answer your questions, please call your gastroenterologist to clarify.  If you requested that your care partner not be given the details of your procedure findings, then the procedure report has been included in a sealed envelope for you to review at your convenience later.  YOU SHOULD EXPECT: Some feelings of bloating in the abdomen. Passage of more gas than usual.  Walking can help get rid of the air that was put into your GI tract during the procedure and reduce the bloating. If you had a lower endoscopy (such as a colonoscopy or flexible sigmoidoscopy) you may notice spotting of blood in your stool or on the toilet paper. If you underwent a bowel prep for your procedure, you may not have a normal bowel movement for a few days.  Please Note:  You might notice some irritation and congestion in your nose or some drainage.  This is from the oxygen used during your procedure.  There is no need for concern and it should clear up in a day or so.  SYMPTOMS TO REPORT IMMEDIATELY:   Following lower endoscopy (colonoscopy or flexible sigmoidoscopy):  Excessive amounts of blood in the stool  Significant tenderness or worsening of abdominal pains  Swelling of the abdomen that is new, acute  Fever of 100F or higher    For urgent or emergent issues, a gastroenterologist can be reached at any hour by calling [phone].   DIET: Your first meal following the procedure should be a small meal and then it is ok to progress to your normal diet. Heavy or fried foods are harder to digest and may make you feel nauseous or bloated.  Likewise, meals heavy in dairy and vegetables can increase bloating.  Drink plenty of fluids but you should avoid alcoholic beverages for 24  hours.  ACTIVITY:  You should plan to take it easy for the rest of today and you should NOT DRIVE or use heavy machinery until tomorrow (because of the sedation medicines used during the test).    FOLLOW UP: Our staff will call the number listed on your records the next business day following your procedure to check on you and address any questions or concerns that you may have regarding the information given to you following your procedure. If we do not reach you, we will leave a message.  However, if you are feeling well and you are not experiencing any problems, there is no need to return our call.  We will assume that you have returned to your regular daily activities without incident.  If any biopsies were taken you will be contacted by phone or by letter within the next 1-3 weeks.  Please call us at [phone] if you have not heard about the biopsies in 3 weeks.    SIGNATURES/CONFIDENTIALITY: You and/or your care partner have signed paperwork which will be entered into your electronic medical record.  These signatures attest to the fact that that the information above on your After Visit Summary has been reviewed and is understood.  Full responsibility of the confidentiality of this discharge information lies with you and/or your care-partner.  Polyp and hemorrhoid information given.  Resume Xarelto today

## 2016-01-26 NOTE — Progress Notes (Signed)
Report to PACU, RN, vss, BBS= Clear.  

## 2016-01-27 ENCOUNTER — Telehealth: Payer: Self-pay | Admitting: *Deleted

## 2016-01-27 NOTE — Telephone Encounter (Signed)
  Follow up Call-  Call back number 01/26/2016  Post procedure Call Back phone  # [phone] hm  Permission to leave phone message Yes     Patient questions:  Do you have a fever, pain , or abdominal swelling? No. Pain Score  0 *  Have you tolerated food without any problems? Yes.    Have you been able to return to your normal activities? Yes.    Do you have any questions about your discharge instructions: Diet   No. Medications  No. Follow up visit  No.  Do you have questions or concerns about your Care? No.  Actions: * If pain score is 4 or above: No action needed, pain <4.  Pt having nose irritation.

## 2016-01-29 ENCOUNTER — Encounter: Payer: Self-pay | Admitting: Internal Medicine

## 2016-02-11 ENCOUNTER — Telehealth: Payer: Self-pay | Admitting: Internal Medicine

## 2016-02-11 NOTE — Telephone Encounter (Signed)
New message       *STAT* If patient is at the pharmacy, call can be transferred to refill team.   1. Which medications need to be refilled? (please list name of each medication and dose if known) xarelto 20 2. Which pharmacy/location (including street and city if local pharmacy) is medication to be sent to? [facility] [address] 3. Do they need a 30 day or 90 day supply? 30 day

## 2016-02-11 NOTE — Telephone Encounter (Signed)
Patients pcp has been authorizing her xarelto rx's but she is requesting that Dr Harrington Challenger refill it. Ok to send in or should patient contact pc for this? Please advise. Thanks, MI

## 2016-02-12 ENCOUNTER — Telehealth: Payer: Self-pay | Admitting: Internal Medicine

## 2016-02-12 ENCOUNTER — Encounter: Payer: Self-pay | Admitting: Internal Medicine

## 2016-02-12 MED ORDER — RIVAROXABAN 20 MG PO TABS: ORAL_TABLET | ORAL | Status: DC

## 2016-02-12 NOTE — Telephone Encounter (Signed)
Rx(s) sent to pharmacy electronically.  

## 2016-02-12 NOTE — Telephone Encounter (Signed)
Follow-up     The pt is calling regarding, the xarelto   The pharmacy told the pt and said the MD would have to give authorization for the medication.  Pt c/o medication issue:  1. Name of Medication: Xarelto  2. How are you currently taking this medication (dosage and times per day)? 20 mg po daily  3. Are you having a reaction (difficulty breathing--STAT)? no  4. What is your medication issue? The insurance has not heard from the doctor to give authorization.

## 2016-02-12 NOTE — Telephone Encounter (Signed)
Pt called in as refill was sent to pharmacy. Pt stated that refill was not problem it was a PA needed by her insurance. Spoke with pharmacy they stated it was started by their office and sent to our office multiple times. Pt information confirmed and gave correct fax number, note sent to [facility] to make aware.

## 2016-02-12 NOTE — Telephone Encounter (Signed)
Please refill her Xarelto

## 2016-02-15 ENCOUNTER — Telehealth: Payer: Self-pay

## 2016-02-15 NOTE — Telephone Encounter (Signed)
Xarelto approved

## 2016-02-15 NOTE — Telephone Encounter (Signed)
Prior auth obtained for Xarelto 20 mg from [facility]. PA- [id number]. Pharmacy notified.

## 2016-03-29 ENCOUNTER — Other Ambulatory Visit: Payer: Self-pay | Admitting: *Deleted

## 2016-03-29 MED ORDER — SIMVASTATIN 40 MG PO TABS: ORAL_TABLET | ORAL | 3 refills | Status: DC

## 2016-04-01 NOTE — Telephone Encounter (Signed)
Erroneous encounter This encounter was created in error - please disregard. 

## 2016-05-01 IMAGING — CT CT ANGIO CHEST
1 of 2 series · 18 of 32 positions shown · IV contrast (OMNIPAQUE 350)
Comparison: Chest CT angiogram June 27, 2014; chest radiograph
June 22, 2014

ADDENDUM:
Critical Value/emergent results were called by telephone at the time
of interpretation on 11/21/2014 at [DATE] to Dr. Lyn, [facility]
physician, who verbally acknowledged these results.
CLINICAL DATA: Left-sided chest pain and difficulty breathing.
History of prior pulmonary embolus.

EXAM:
CT angiogram chest
TECHNIQUE: Multidetector CT images were obtained through the chest following
dynamic bolus of nonionic intravenous contrast material. Multiplanar
reformats obtained.
CONTRAST:  100 mL Omnipaque 350 nonionic

[Series 10: thins for pacs · axial · 0.60mm/px · z∈[-490,-244]mm · 18 of 274 slices shown]
[im 14/274  lung]
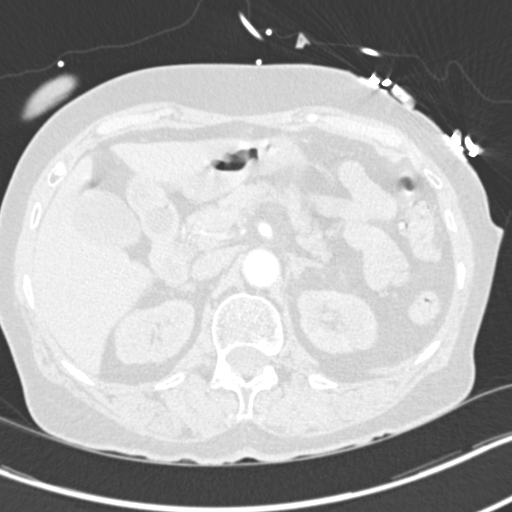
[im 28/274  mediastinal]
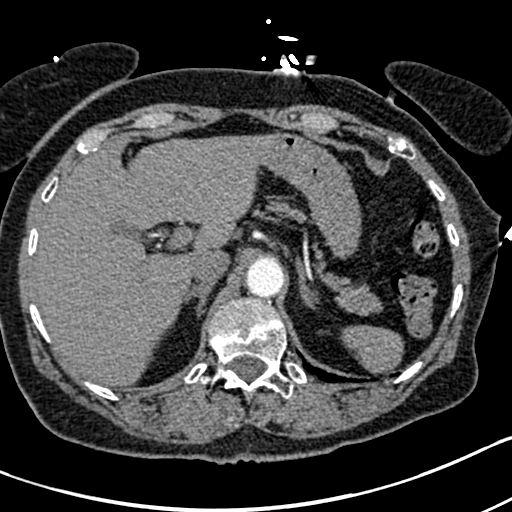
[im 55/274  lung]
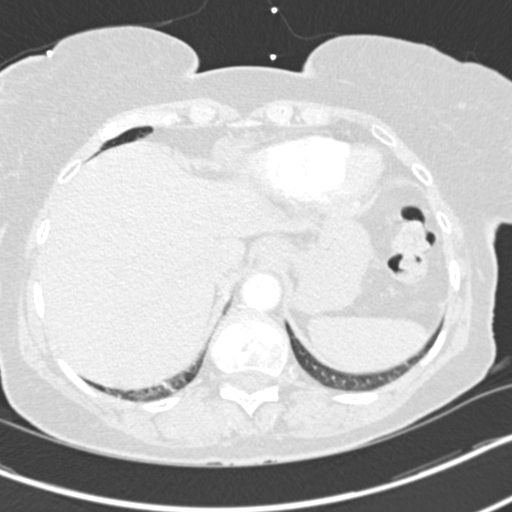
[im 69/274  mediastinal]
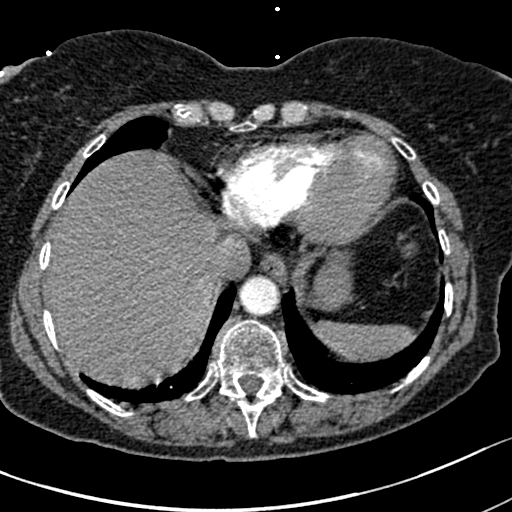
[im 82/274  lung]
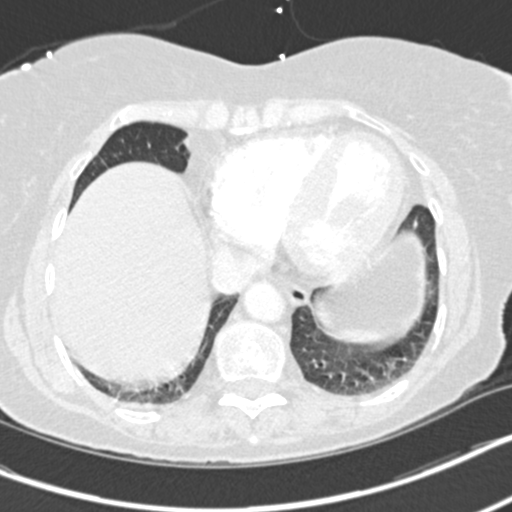
[im 92/274  mediastinal]
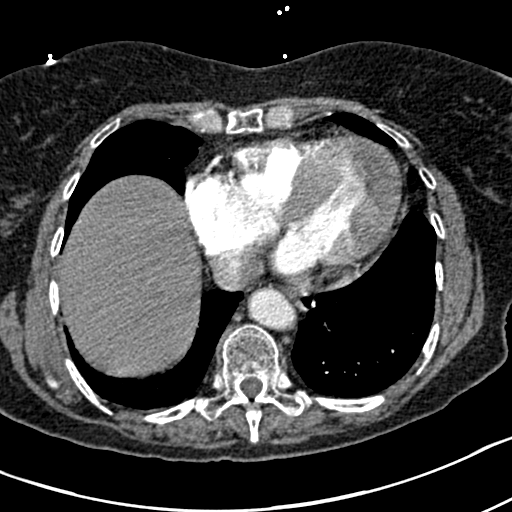
[im 96/274  lung]
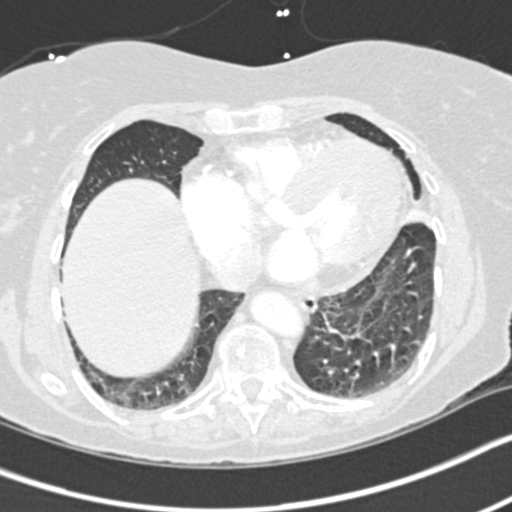
[im 123/274  mediastinal]
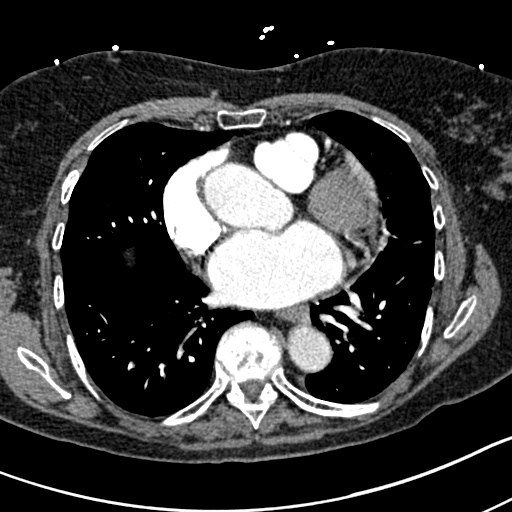
[im 128/274  lung]
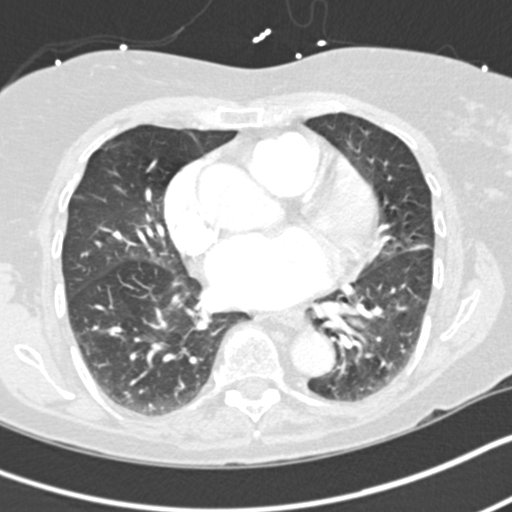
[im 137/274  mediastinal]
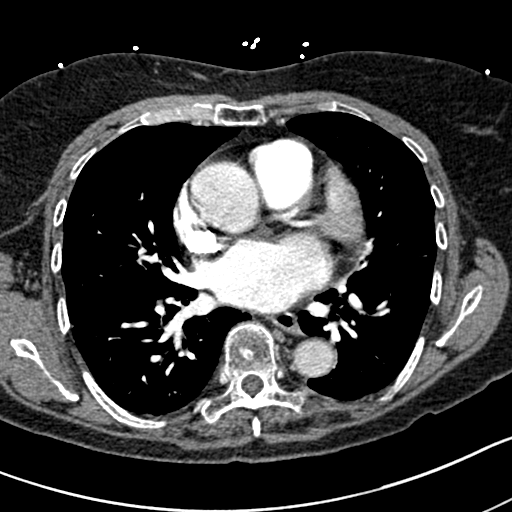
[im 151/274  lung]
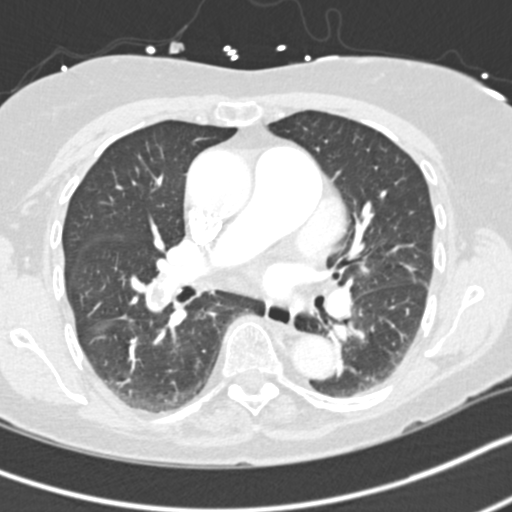
[im 178/274  mediastinal]
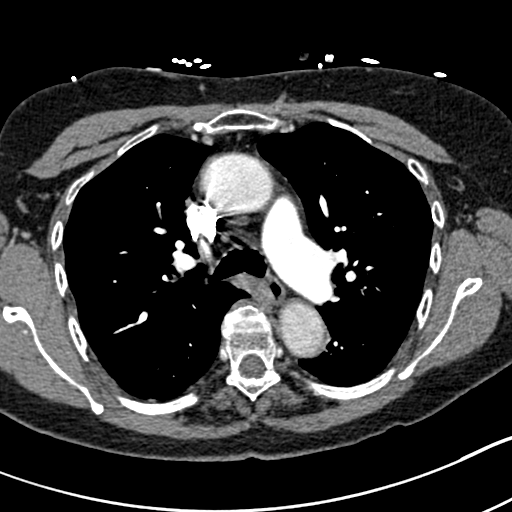
[im 183/274  lung]
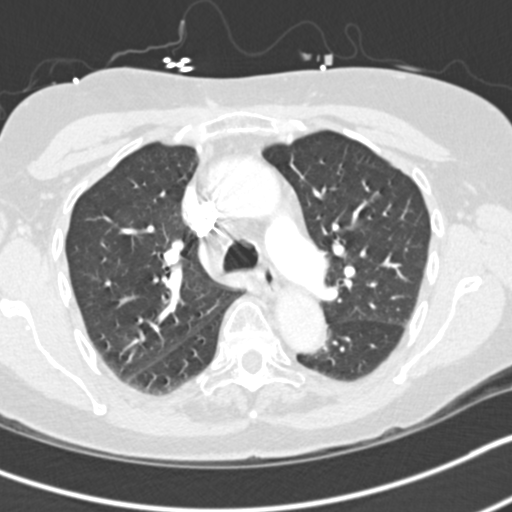
[im 192/274  mediastinal]
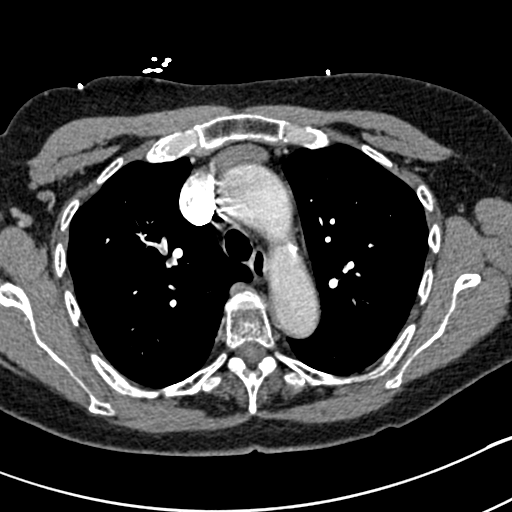
[im 205/274  lung]
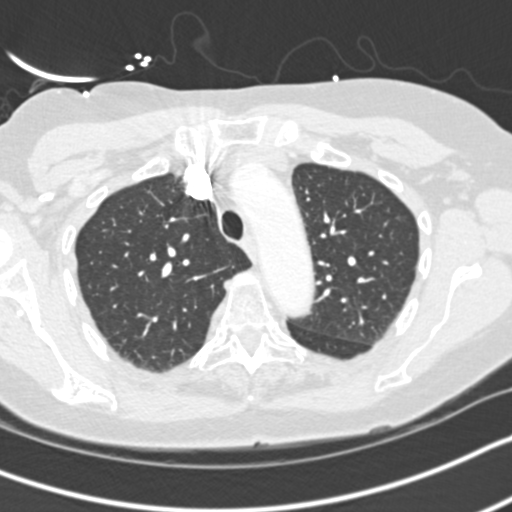
[im 219/274  mediastinal]
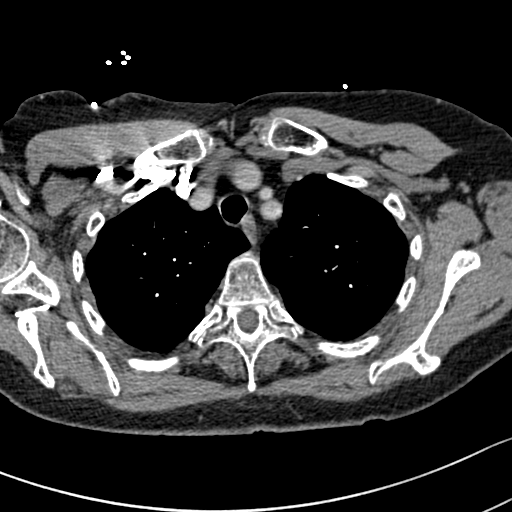
[im 246/274  lung]
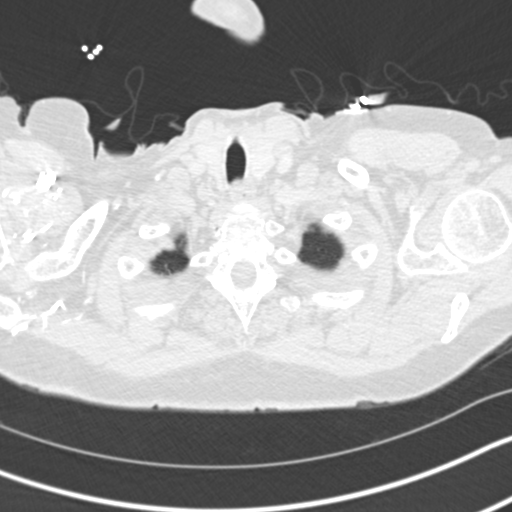
[im 260/274  mediastinal]
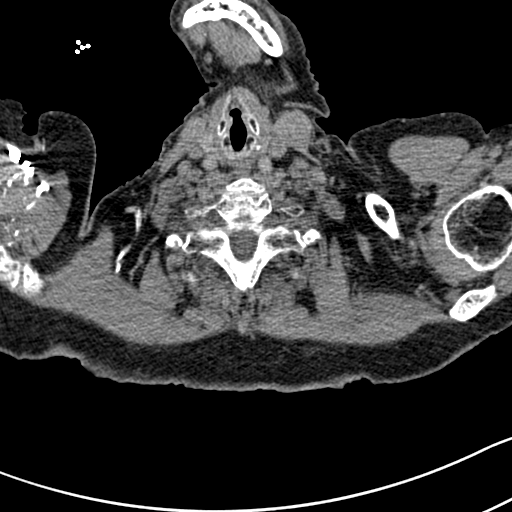

[18 of 32 positions shown; findings below may reference images not displayed]

FINDINGS: There is pulmonary embolus arising from the distal right main
pulmonary artery extending into the proximal right intralobar
pulmonary artery. There is less pulmonary embolus burden in this
area compared to the prior CT. There is a small pulmonary embolus in
a peripheral branch of the anterior segment left upper lobe
pulmonary artery, apparently new from prior study. No other
pulmonary embolus seen. The right ventricle to left ventricle
diameter ratio is mildly increased at 1.1, a finding that is
indicative of a degree of right heart strain. There is prominence of
the ascending aorta with a maximum transverse diameter of 4.1 x
cm. No thoracic aortic dissection is seen.

There is underlying emphysematous change. There is a stable 4 mm
nodular opacity in the periphery of the superior segment of the
right lower lobe. There is a stable 3 mm nodular opacity in the
lateral segment right middle lobe. There is mild bibasilar
atelectatic change. There is no edema or consolidation.

There is left ventricular hypertrophy. The pericardium is not
thickened. There is no appreciable thoracic adenopathy. Thyroid
appears normal.

In the visualized upper abdomen, there is an 8 mm probable adenoma
in the left adrenal. There are no blastic or lytic bone lesions.
There is degenerative change in the thoracic spine.
IMPRESSION: Pulmonary emboli, with central pulmonary embolus on the right. It is
difficult to ascertain whether the pulmonary embolus on the right is
new versus chronic. There is no surrounding vessel sclerosis or web
to suggests chronic change. The tiny pulmonary embolus in the left
upper lobe appears new. Positive for acute PE with CT evidence of
right heartstrain (RV/LV Ratio = 1.1) consistent with at least
submassive (intermediate risk)PE. The presence of right heart strain
has been associated with anincreased risk of morbidity and
mortality. Consultation with Pulmonaryand [REDACTED] is
recommended.

Stable small pulmonary nodular lesions, felt to be benign. No
airspace consolidation.

Prominence in at ascending aorta. Recommend annual imaging followup
by CTA or MRA. This recommendation follows 3272
ACCF/AHA/AATS/ACR/ASA/SCA/[place]GGIE/[place]VINS Guidelines for the
Diagnosis and Management of Patients with Thoracic Aortic Disease.
Circulation. 3272; 121: [id number]

Comment: Initial dictation of this report was lost due to computer
error. This report was called by phone to the emergency department
physician earlier in the day.

## 2016-05-10 ENCOUNTER — Other Ambulatory Visit: Payer: Self-pay | Admitting: Internal Medicine

## 2016-05-12 ENCOUNTER — Encounter: Payer: Self-pay | Admitting: Internal Medicine

## 2016-05-12 ENCOUNTER — Other Ambulatory Visit (INDEPENDENT_AMBULATORY_CARE_PROVIDER_SITE_OTHER): Payer: Medicare Other

## 2016-05-12 ENCOUNTER — Ambulatory Visit (INDEPENDENT_AMBULATORY_CARE_PROVIDER_SITE_OTHER): Payer: Medicare Other | Admitting: Internal Medicine

## 2016-05-12 VITALS — BP 130/88 | HR 64 | Temp 97.7°F | Wt 175.0 lb

## 2016-05-12 DIAGNOSIS — R1031 Right lower quadrant pain: Secondary | ICD-10-CM | POA: Diagnosis not present

## 2016-05-12 DIAGNOSIS — K2970 Gastritis, unspecified, without bleeding: Secondary | ICD-10-CM

## 2016-05-12 LAB — URINALYSIS, ROUTINE W REFLEX MICROSCOPIC
BILIRUBIN URINE: NEGATIVE
NITRITE: NEGATIVE
PH: 5.5 (ref 5.0–8.0)
TOTAL PROTEIN, URINE-UPE24: NEGATIVE
UROBILINOGEN UA: 0.2 (ref 0.0–1.0)
Urine Glucose: 100 — AB

## 2016-05-12 MED ORDER — PANTOPRAZOLE SODIUM 40 MG PO TBEC: 40.0000 mg | DELAYED_RELEASE_TABLET | Freq: Every day | ORAL | 3 refills | Status: DC

## 2016-05-12 NOTE — Progress Notes (Signed)
Pre visit review using our clinic review tool, if applicable. No additional management support is needed unless otherwise documented below in the visit note. 

## 2016-05-12 NOTE — Patient Instructions (Signed)
We have have sent in protonix for the stomach. Take 1 pill before breakfast daily and you should be having less symptoms in 2-3 days of starting.   If you are still having the symptoms call the office back.  We are checking the urine today and will call you back with the results.

## 2016-05-12 NOTE — Progress Notes (Signed)
   Subjective:    Patient ID: Monique [name], female    DOB: 14-Mar-1936, 80 y.o.   MRN: [id number]  HPI The patient is a 80 YO female coming in for acid reflux for the last 2 weeks. She is not taking anything for this yet. She took an OTC prilosec one day and it helped for about 12 hours. She is having the symptoms mostly after eating and at night time. It comes and goes. She does not take tums or maalox. Also the last day she is having some RLQ pain with urination and more frequency. She denies that the reflux starts with activity and denies that it feels like her typical chest pain from her heart. Denies nausea or vomiting. Denies diarrhea or constipation.   Review of Systems  Constitutional: Negative for activity change, appetite change, chills, fatigue, fever and unexpected weight change.  Respiratory: Negative for cough, chest tightness, shortness of breath and wheezing.   Cardiovascular: Negative for chest pain, palpitations and leg swelling.  Gastrointestinal: Positive for abdominal pain. Negative for abdominal distention, blood in stool, constipation, diarrhea and nausea.  Genitourinary: Positive for dysuria and frequency. Negative for difficulty urinating, dyspareunia, flank pain, pelvic pain and urgency.  Neurological: Negative for dizziness, weakness and light-headedness.      Objective:   Physical Exam  Constitutional: She is oriented to person, place, and time. She appears well-developed and well-nourished.  HENT:  Head: Normocephalic and atraumatic.     Eyes: EOM are normal.  Neck: Normal range of motion.  Cardiovascular: Normal rate and regular rhythm.   Pulmonary/Chest: Effort normal and breath sounds normal. No respiratory distress. She has no wheezes. She has no rales.  Abdominal: Soft. Bowel sounds are normal. She exhibits no distension and no mass. There is tenderness. There is no rebound and no guarding.  Very minimal tenderness in the RLQ. No guarding or rebound.     Musculoskeletal: She exhibits no edema.  Neurological: She is alert and oriented to person, place, and time. Coordination normal.  Skin: Skin is warm and dry.   Vitals:   05/12/16 1557 05/12/16 1621  BP: (!) 150/98 130/88  Pulse: 64   Temp: 97.7 F (36.5 C)   TempSrc: Oral   SpO2: 94%   Weight: 175 lb (79.4 kg)       Assessment & Plan:

## 2016-05-13 ENCOUNTER — Other Ambulatory Visit: Payer: Self-pay | Admitting: Internal Medicine

## 2016-05-13 MED ORDER — NITROFURANTOIN MONOHYD MACRO 100 MG PO CAPS: 100.0000 mg | ORAL_CAPSULE | Freq: Two times a day (BID) | ORAL | 0 refills | Status: DC

## 2016-05-13 NOTE — Assessment & Plan Note (Signed)
She is having recurrent symptoms and rx for pantoprazole. If no resolution in 2-3 days she needs to call her heart doctor for evaluation although the symptoms are different than her heart symptoms.

## 2016-05-13 NOTE — Assessment & Plan Note (Signed)
Checking U/A as symptoms are consistent with UTI. Denies constipation or diarrhea.

## 2016-06-27 ENCOUNTER — Other Ambulatory Visit: Payer: Self-pay | Admitting: Internal Medicine

## 2016-06-28 ENCOUNTER — Other Ambulatory Visit: Payer: Self-pay | Admitting: Internal Medicine

## 2016-06-28 NOTE — Telephone Encounter (Signed)
simvastatin (ZOCOR) 40 MG tablet  Medication  Date: 03/29/2016 Department: [address] Office Ordering/Authorizing: Fay Records, MD  Order Providers   Prescribing Provider Encounter Provider  Fay Records, MD Dannette Barbara, CMA  Medication Detail    Disp Refills Start End   simvastatin (ZOCOR) 40 MG tablet 90 tablet 3 03/29/2016    Sig: TAKE 1 TABLET (40 MG TOTAL) BY MOUTH AT BEDTIME.   E-Prescribing Status: Receipt confirmed by pharmacy (03/29/2016 4:49 PM EDT)   Pharmacy   [id number] [place], [place]

## 2016-07-07 ENCOUNTER — Other Ambulatory Visit: Payer: Self-pay | Admitting: Internal Medicine

## 2016-08-22 ENCOUNTER — Other Ambulatory Visit: Payer: Self-pay | Admitting: Internal Medicine

## 2016-08-24 ENCOUNTER — Other Ambulatory Visit: Payer: Self-pay | Admitting: Internal Medicine

## 2016-08-30 ENCOUNTER — Other Ambulatory Visit: Payer: Self-pay | Admitting: Internal Medicine

## 2016-08-31 ENCOUNTER — Telehealth: Payer: Self-pay | Admitting: Internal Medicine

## 2016-08-31 NOTE — Telephone Encounter (Signed)
Called pt to schedule awv. Left msg for pt to call office to schedule appt.  °

## 2016-09-25 ENCOUNTER — Other Ambulatory Visit: Payer: Self-pay | Admitting: Internal Medicine

## 2016-10-02 ENCOUNTER — Other Ambulatory Visit: Payer: Self-pay | Admitting: Internal Medicine

## 2016-10-04 ENCOUNTER — Encounter: Payer: Self-pay | Admitting: Internal Medicine

## 2016-10-04 DIAGNOSIS — Z803 Family history of malignant neoplasm of breast: Secondary | ICD-10-CM | POA: Diagnosis not present

## 2016-10-04 DIAGNOSIS — N644 Mastodynia: Secondary | ICD-10-CM | POA: Diagnosis not present

## 2016-10-04 LAB — HM MAMMOGRAPHY

## 2016-10-07 ENCOUNTER — Other Ambulatory Visit: Payer: Self-pay | Admitting: Internal Medicine

## 2016-10-07 ENCOUNTER — Encounter: Payer: Self-pay | Admitting: Internal Medicine

## 2016-10-07 NOTE — Progress Notes (Unsigned)
Results entered and sent to scan  

## 2016-10-10 ENCOUNTER — Ambulatory Visit: Payer: Medicare Other | Admitting: Internal Medicine

## 2016-10-11 ENCOUNTER — Ambulatory Visit (INDEPENDENT_AMBULATORY_CARE_PROVIDER_SITE_OTHER): Payer: Medicare Other | Admitting: Internal Medicine

## 2016-10-11 ENCOUNTER — Encounter: Payer: Self-pay | Admitting: Internal Medicine

## 2016-10-11 VITALS — BP 150/90 | HR 75 | Temp 98.1°F | Ht 65.0 in | Wt 181.0 lb

## 2016-10-11 DIAGNOSIS — N644 Mastodynia: Secondary | ICD-10-CM

## 2016-10-11 DIAGNOSIS — Z23 Encounter for immunization: Secondary | ICD-10-CM | POA: Diagnosis not present

## 2016-10-11 HISTORY — DX: Mastodynia: N64.4

## 2016-10-11 MED ORDER — DICLOFENAC SODIUM 1 % TD GEL: 2.0000 g | Freq: Four times a day (QID) | TRANSDERMAL | 6 refills | Status: DC

## 2016-10-11 NOTE — Patient Instructions (Signed)
We have given you the flu shot today.  

## 2016-10-11 NOTE — Assessment & Plan Note (Signed)
Improving on exam, mammogram and ultrasound normal. Rx for voltaren gel if needed for pain.

## 2016-10-11 NOTE — Progress Notes (Signed)
Pre visit review using our clinic review tool, if applicable. No additional management support is needed unless otherwise documented below in the visit note. 

## 2016-10-11 NOTE — Progress Notes (Signed)
   Subjective:    Patient ID: Monique [name], female    DOB: [birth year], 81 y.o.   MRN: [id number]  HPI The patient is a 81 YO female coming in for follow up of left breast pain. She had mammogram and ultrasound and was recommended to see [place]. She was having more pain in the left breast after being in the hospital and having a bruise in the area. She also felt like the left breast was swollen more than the right. The mammogram and ultrasound were normal without changes. The pain is overall improving and the swelling is decreasing. No nipple discharge or skin color changes.   Review of Systems  Constitutional: Negative for activity change, appetite change, fatigue, fever and unexpected weight change.  Respiratory: Negative.   Cardiovascular: Negative.   Gastrointestinal: Negative.   Genitourinary: Negative.   Musculoskeletal: Negative.   Skin: Negative.       Objective:   Physical Exam  Constitutional: She is oriented to person, place, and time. She appears well-developed and well-nourished.  HENT:  Head: Normocephalic and atraumatic.  Eyes: EOM are normal.  Neck: Normal range of motion.  Cardiovascular: Normal rate and regular rhythm.   Pulmonary/Chest: Effort normal and breath sounds normal. No respiratory distress. She has no wheezes. She has no rales. She exhibits no tenderness.  Left breast without lumps or changes, no axillary LAD.   Abdominal: Soft.  Neurological: She is alert and oriented to person, place, and time.  Skin: Skin is warm and dry.   Vitals:   10/11/16 1554 10/11/16 1610  BP: (!) 150/100 (!) 150/90  Pulse: 75   Temp: 98.1 F (36.7 C)   TempSrc: Oral   SpO2: 96%   Weight: 181 lb (82.1 kg)   Height: 5\' 5"  (1.651 m)       Assessment & Plan:  Flu shot given at visit.

## 2016-10-17 ENCOUNTER — Telehealth: Payer: Self-pay

## 2016-10-17 NOTE — Telephone Encounter (Signed)
PA started for Diclofenac sodium Key : [id number]

## 2016-11-05 ENCOUNTER — Other Ambulatory Visit: Payer: Self-pay | Admitting: Internal Medicine

## 2016-11-09 ENCOUNTER — Other Ambulatory Visit: Payer: Self-pay | Admitting: Internal Medicine

## 2016-11-09 NOTE — Telephone Encounter (Signed)
Medication Detail    Disp Refills Start End   metoprolol tartrate (LOPRESSOR) 25 MG tablet 60 tablet 0 11/07/2016    Sig: TAKE 1 TABLET TWICE A DAY   Notes to Pharmacy: Please keep upcoming appointment for future refills. Thank you   E-Prescribing Status: Receipt confirmed by pharmacy (11/07/2016 12:46 PM EST)   Pharmacy   [id number] - [place], [place]

## 2016-11-10 ENCOUNTER — Telehealth: Payer: Self-pay

## 2016-11-10 NOTE — Progress Notes (Signed)
Cardiology Office Note   Date:  11/11/2016   ID:  Monique [name], Monique [name] 11-Nov-1935, MRN [id number]  PCP:  Hoyt Koch, MD  Cardiologist:   Dorris Carnes, MD   F/U of HTN     History of Present Illness: Alaiza Yau is a 81 y.o. female with a history of HOCM, HTN, PAF, bilateral PE, HL and SAH  I saw the pt in July 2016 Since seen she says she has done OK  Breathing is OK  No CP  No dizziness       Current Meds  Medication Sig  . amLODipine (NORVASC) 5 MG tablet TAKE 1 TABLET BY MOUTH EVERY DAY  . disopyramide (NORPACE) 150 MG capsule Take 1 capsule (150 mg total) by mouth 2 (two) times daily. *Patient is overdue for an appt. Please call and schedule for further refills*  . metoprolol tartrate (LOPRESSOR) 25 MG tablet TAKE 1 TABLET TWICE A DAY  . mirtazapine (REMERON) 15 MG tablet TAKE 1 TABLET (15 MG TOTAL) BY MOUTH AT BEDTIME.  . nitroGLYCERIN (NITROSTAT) 0.4 MG SL tablet Place 1 tablet (0.4 mg total) under the tongue every 5 (five) minutes as needed for chest pain.  . pantoprazole (PROTONIX) 40 MG tablet TAKE 1 TABLET EVERY DAY  . rivaroxaban (XARELTO) 20 MG TABS tablet TAKE 1 TABLET (20 MG TOTAL) BY MOUTH DAILY WITH SUPPER.  . sertraline (ZOLOFT) 50 MG tablet TAKE 1 TABLET (50 MG TOTAL) BY MOUTH DAILY.  . simvastatin (ZOCOR) 40 MG tablet TAKE 1 TABLET (40 MG TOTAL) BY MOUTH AT BEDTIME.  Marland Kitchen XARELTO 20 MG TABS tablet TAKE 1 TABLET BY MOUTH EVERY DAY WITH SUPPER     Allergies:   Patient has no known allergies.   Past Medical History:  Diagnosis Date  . CHF (congestive heart failure) ([place])   . Chronic anticoagulation 06/2014   Coumadin started after bilateral PE  . Clotting disorder ([place])    right was removed, still have left cataract  . Fatty liver   . Heart murmur   . HTN (hypertension)    Essential  . Hyperlipidemia   . Kidney stones   . LVH (left ventricular hypertrophy)   . PAF (paroxysmal atrial fibrillation) ([place]) 2000  . PE (pulmonary embolism)  06/2014   Bilateral  . Subarachnoid hemorrhage ([place]) 2000    Past Surgical History:  Procedure Laterality Date  . ABDOMINAL HYSTERECTOMY  2000  . CARDIAC CATHETERIZATION  05/2008   Nl cors, no gradient, EF 60%     Social History:  The patient  reports that she quit smoking about 49 years ago. She has never used smokeless tobacco. She reports that she does not drink alcohol or use drugs.   Family History:  The patient's family history includes Arrhythmia in her mother and sister; Breast cancer in her daughter and mother; Heart disease in her mother; Stroke in her mother.    ROS:  Please see the history of present illness. All other systems are reviewed and  Negative to the above problem except as noted.    PHYSICAL EXAM: VS:  BP (!) 150/102   Pulse 62   Ht 5\' 5"  (1.651 m)   Wt 178 lb 3.2 oz (80.8 kg)   BMI 29.65 kg/m   GEN: Well nourished, well developed, in no acute distress  HEENT: normal  Neck: no JVD, carotid bruits, or masses Cardiac: RRR   NO rubs, or gallops,no edema no murmurs   Respiratory:  clear to auscultation  bilaterally, normal work of breathing GI: soft, nontender, nondistended, + BS  No hepatomegaly  MS: no deformity Moving all extremities   Skin: warm and dry, no rash Neuro:  Strength and sensation are intact Psych: euthymic mood, full affect   EKG:  EKG is ordered today.  SR  62 bpm  LVH with repolarization abnormality  Prolonged QT     Lipid Panel    Component Value Date/Time   CHOL 161 12/24/2012 0925   TRIG 88.0 12/24/2012 0925   HDL 58.60 12/24/2012 0925   CHOLHDL 3 12/24/2012 0925   VLDL 17.6 12/24/2012 0925   LDLCALC 85 12/24/2012 0925      Wt Readings from Last 3 Encounters:  11/11/16 178 lb 3.2 oz (80.8 kg)  10/11/16 181 lb (82.1 kg)  05/12/16 175 lb (79.4 kg)      ASSESSMENT AND PLAN:  1  HTN  BP is out of control  It is 160/95 on my check  She says that it is better at home  I would recomm she take BP a few times per wk for a  few wks  Come back with cuff to calibrate  2  HOCM  No murmur on exam  Continue meds   3  Hx PE  On Xarelto  Will check CBC and BMET  4  Hx SAH  Unfort alo with PE    5  HL  Will check lipds     F/U in 1 year  Sooner if BP check in a few wks high     Current medicines are reviewed at length with the patient today.  The patient does not have concerns regarding medicines.  Signed, Dorris Carnes, MD  11/11/2016 8:37 AM    [address], [place], [place]  [address] Phone: [phone]; Fax: [phone]

## 2016-11-10 NOTE — Telephone Encounter (Signed)
PA denied for Diclofenac sodium, are there any alternatives? Please advise

## 2016-11-11 ENCOUNTER — Ambulatory Visit (INDEPENDENT_AMBULATORY_CARE_PROVIDER_SITE_OTHER): Payer: Medicare Other | Admitting: Internal Medicine

## 2016-11-11 ENCOUNTER — Encounter: Payer: Self-pay | Admitting: Internal Medicine

## 2016-11-11 VITALS — BP 150/102 | HR 62 | Ht 65.0 in | Wt 178.2 lb

## 2016-11-11 DIAGNOSIS — I10 Essential (primary) hypertension: Secondary | ICD-10-CM

## 2016-11-11 DIAGNOSIS — E785 Hyperlipidemia, unspecified: Secondary | ICD-10-CM

## 2016-11-11 DIAGNOSIS — I480 Paroxysmal atrial fibrillation: Secondary | ICD-10-CM

## 2016-11-11 MED ORDER — DISOPYRAMIDE PHOSPHATE 150 MG PO CAPS: 150.0000 mg | ORAL_CAPSULE | Freq: Two times a day (BID) | ORAL | 0 refills | Status: DC

## 2016-11-11 NOTE — Patient Instructions (Signed)
Medication Instructions:  Your physician recommends that you continue on your current medications as directed. Please refer to the Current Medication list given to you today.   Labwork: LABS TODAY: CBC, BMET, LIPIDS  Testing/Procedures: None ordered  Follow-Up: Your physician recommends that you schedule a NURSE VISIT for BP Check in 3 WEEKS. Please bring a log of your Blood pressures and also please bring your BP cuff to compare.  Your physician wants you to follow-up in: 1 year with Dr. Harrington Challenger. You will receive a reminder letter in the mail two months in advance. If you don't receive a letter, please call our office to schedule the follow-up appointment.   Any Other Special Instructions Will Be Listed Below (If Applicable).     If you need a refill on your cardiac medications before your next appointment, please call your pharmacy.

## 2016-11-13 LAB — CBC
HEMATOCRIT: 41 % (ref 34.0–46.6)
Hemoglobin: 13.5 g/dL (ref 11.1–15.9)
MCH: 30.9 pg (ref 26.6–33.0)
MCHC: 32.9 g/dL (ref 31.5–35.7)
MCV: 94 fL (ref 79–97)
Platelets: 201 10*3/uL (ref 150–379)
RBC: 4.37 x10E6/uL (ref 3.77–5.28)
RDW: 13.8 % (ref 12.3–15.4)
WBC: 8.2 10*3/uL (ref 3.4–10.8)

## 2016-11-13 LAB — BASIC METABOLIC PANEL
BUN/Creatinine Ratio: 12 (ref 12–28)
BUN: 16 mg/dL (ref 8–27)
CALCIUM: 9.6 mg/dL (ref 8.7–10.3)
CO2: 26 mmol/L (ref 18–29)
Chloride: 101 mmol/L (ref 96–106)
Creatinine, Ser: 1.29 mg/dL — ABNORMAL HIGH (ref 0.57–1.00)
GFR, EST AFRICAN AMERICAN: 46 mL/min/{1.73_m2} — AB (ref >59)
GFR, EST NON AFRICAN AMERICAN: 40 mL/min/{1.73_m2} — AB (ref >59)
Glucose: 143 mg/dL — ABNORMAL HIGH (ref 65–99)
Potassium: 4.2 mmol/L (ref 3.5–5.2)
Sodium: 142 mmol/L (ref 134–144)

## 2016-11-13 LAB — LIPID PANEL
CHOL/HDL RATIO: 2.7 ratio (ref 0.0–4.4)
Cholesterol, Total: 140 mg/dL (ref 100–199)
HDL: 51 mg/dL (ref >39)
LDL Calculated: 65 mg/dL (ref 0–99)
Triglycerides: 118 mg/dL (ref 0–149)
VLDL CHOLESTEROL CAL: 24 mg/dL (ref 5–40)

## 2016-11-14 NOTE — Telephone Encounter (Signed)
Unable to call patient, get busy tone when calling

## 2016-11-14 NOTE — Telephone Encounter (Signed)
She can try lidocaine over the counter to see if it works.

## 2016-11-21 ENCOUNTER — Telehealth: Payer: Self-pay | Admitting: Internal Medicine

## 2016-11-21 NOTE — Telephone Encounter (Signed)
New message    Pt is returning call from [place] on Friday about results.

## 2016-11-21 NOTE — Telephone Encounter (Signed)
See result note from 11/18/16.

## 2016-12-02 ENCOUNTER — Ambulatory Visit (INDEPENDENT_AMBULATORY_CARE_PROVIDER_SITE_OTHER): Payer: Medicare Other | Admitting: *Deleted

## 2016-12-02 VITALS — BP 164/100 | HR 68 | Ht 65.0 in | Wt 178.0 lb

## 2016-12-02 DIAGNOSIS — I10 Essential (primary) hypertension: Secondary | ICD-10-CM | POA: Diagnosis not present

## 2016-12-02 MED ORDER — AMLODIPINE BESYLATE 5 MG PO TABS: 5.0000 mg | ORAL_TABLET | Freq: Two times a day (BID) | ORAL | 11 refills | Status: DC

## 2016-12-02 NOTE — Patient Instructions (Signed)
Your physician has recommended you make the following change in your medication: INCREASE AMLODIPINE TO 5 MG  TWICE DAILY  Your physician recommends that you schedule a follow-up appointment in:  [place]  B/P CUFF  TO APPT

## 2016-12-02 NOTE — Progress Notes (Signed)
REVIEWED WITH DR  ROSS  PT  TO INCREASE  AMLODIPINE TO 5 MG  TWICE DAILY  AND RETURN   IN 6  WEEKS TO SEE  DR ROSS  PT  AWARE./CY

## 2016-12-05 DIAGNOSIS — R3129 Other microscopic hematuria: Secondary | ICD-10-CM | POA: Diagnosis not present

## 2016-12-05 DIAGNOSIS — N390 Urinary tract infection, site not specified: Secondary | ICD-10-CM | POA: Diagnosis not present

## 2016-12-06 ENCOUNTER — Other Ambulatory Visit: Payer: Self-pay | Admitting: Internal Medicine

## 2016-12-07 ENCOUNTER — Other Ambulatory Visit: Payer: Self-pay | Admitting: Internal Medicine

## 2016-12-12 ENCOUNTER — Other Ambulatory Visit: Payer: Self-pay | Admitting: Internal Medicine

## 2016-12-12 ENCOUNTER — Telehealth: Payer: Self-pay | Admitting: Internal Medicine

## 2016-12-12 MED ORDER — DISOPYRAMIDE PHOSPHATE 150 MG PO CAPS: 150.0000 mg | ORAL_CAPSULE | Freq: Two times a day (BID) | ORAL | 11 refills | Status: DC

## 2016-12-12 NOTE — Telephone Encounter (Signed)
New message      *STAT* If patient is at the pharmacy, call can be transferred to refill team.   1. Which medications need to be refilled? (please list name of each medication and dose if known)   disopyramide (NORPACE) 150 MG capsule Take 1 capsule (150 mg total) by mouth 2 (two) times daily. *Patient is overdue for an appt. Please call and schedule for further refills*     2. Which pharmacy/location (including street and city if local pharmacy) is [facility] [employer] [facility]   3. Do they need a 30 day or 90 day supply? 30  Pt has appt on 01/13/17 920a

## 2016-12-15 ENCOUNTER — Other Ambulatory Visit: Payer: Medicare Other

## 2016-12-15 ENCOUNTER — Ambulatory Visit (INDEPENDENT_AMBULATORY_CARE_PROVIDER_SITE_OTHER): Payer: Medicare Other | Admitting: Internal Medicine

## 2016-12-15 ENCOUNTER — Encounter: Payer: Self-pay | Admitting: Internal Medicine

## 2016-12-15 VITALS — BP 150/94 | HR 71 | Temp 97.7°F | Resp 12 | Ht 65.0 in | Wt 179.0 lb

## 2016-12-15 DIAGNOSIS — R300 Dysuria: Secondary | ICD-10-CM

## 2016-12-15 DIAGNOSIS — N390 Urinary tract infection, site not specified: Secondary | ICD-10-CM

## 2016-12-15 DIAGNOSIS — N3001 Acute cystitis with hematuria: Secondary | ICD-10-CM

## 2016-12-15 LAB — POC URINALSYSI DIPSTICK (AUTOMATED)
BILIRUBIN UA: NEGATIVE
Glucose, UA: NEGATIVE
KETONES UA: NEGATIVE
Nitrite, UA: POSITIVE
Urobilinogen, UA: 0.2 E.U./dL
pH, UA: 6 (ref 5.0–8.0)

## 2016-12-15 MED ORDER — FOSFOMYCIN TROMETHAMINE 3 G PO PACK: 3.0000 g | PACK | Freq: Once | ORAL | 0 refills | Status: AC

## 2016-12-15 NOTE — Progress Notes (Signed)
   Subjective:    Patient ID: Monique [name], female    DOB: [birth year], 81 y.o.   MRN: [id number]  HPI The patient is a 81 YO female coming in for bladder infection. She was seen and treated at an urgent care facility with keflex (they tried to prescribe cipro but this interacted with her disopyramide) and she took an entire week of medication without relief of symptoms. She had some mild improvement in the burning pain but it is still present. She is having cloudy urine with odor. She denies fevers or chills. Denies back pain or nausea or vomiting. No fevers or chills.   Review of Systems  Constitutional: Negative.   Respiratory: Negative.   Cardiovascular: Negative.   Gastrointestinal: Positive for abdominal pain. Negative for abdominal distention, anal bleeding, blood in stool, nausea, rectal pain and vomiting.  Genitourinary: Positive for dysuria and urgency. Negative for decreased urine volume, difficulty urinating, dyspareunia, flank pain, frequency, hematuria, pelvic pain, vaginal discharge and vaginal pain.  Musculoskeletal: Negative.       Objective:   Physical Exam  Constitutional: She is oriented to person, place, and time. She appears well-developed and well-nourished.  HENT:  Head: Normocephalic and atraumatic.  Eyes: EOM are normal.  Cardiovascular: Normal rate.   Pulmonary/Chest: Effort normal.  Abdominal: Soft. There is tenderness.  Minimal suprapubic pain, no flank pain.   Musculoskeletal: She exhibits no edema.  Neurological: She is alert and oriented to person, place, and time.  Skin: Skin is warm and dry.   Vitals:   12/15/16 1553  BP: (!) 150/94  Pulse: 71  Resp: 12  Temp: 97.7 F (36.5 C)  TempSrc: Oral  SpO2: 98%  Weight: 179 lb (81.2 kg)  Height: 5\' 5"  (1.651 m)      Assessment & Plan:

## 2016-12-15 NOTE — Progress Notes (Signed)
Pre visit review using our clinic review tool, if applicable. No additional management support is needed unless otherwise documented below in the visit note. 

## 2016-12-15 NOTE — Patient Instructions (Signed)
We have sent in fosfomycin which is a powder medicine which you mix with liquid and drink one time. This lasts for 5 days to clear up the infection.

## 2016-12-15 NOTE — Assessment & Plan Note (Signed)
Has failed initial therapy of keflex. She cannot take cipro due to medication reaction. Given the treatment failure will rx fosfomycin which does not have reaction with her medications. U/A consistent with infection and sending for culture given the treatment failure.

## 2016-12-20 LAB — URINE CULTURE

## 2016-12-26 ENCOUNTER — Other Ambulatory Visit: Payer: Self-pay | Admitting: Internal Medicine

## 2016-12-26 ENCOUNTER — Telehealth: Payer: Self-pay | Admitting: Internal Medicine

## 2016-12-26 MED ORDER — MIRTAZAPINE 15 MG PO TABS: ORAL_TABLET | ORAL | 3 refills | Status: DC

## 2016-12-26 NOTE — Telephone Encounter (Signed)
sent 

## 2016-12-26 NOTE — Telephone Encounter (Signed)
Pt would like a refill of mirtazapine (REMERON) 15 MG tablet   [facility] [address].

## 2016-12-29 ENCOUNTER — Other Ambulatory Visit: Payer: Medicare Other

## 2016-12-29 ENCOUNTER — Encounter: Payer: Self-pay | Admitting: Internal Medicine

## 2016-12-29 ENCOUNTER — Ambulatory Visit (INDEPENDENT_AMBULATORY_CARE_PROVIDER_SITE_OTHER): Payer: Medicare Other | Admitting: Internal Medicine

## 2016-12-29 VITALS — BP 142/90 | HR 72 | Temp 98.0°F | Resp 12 | Ht 65.0 in | Wt 179.0 lb

## 2016-12-29 DIAGNOSIS — N3001 Acute cystitis with hematuria: Secondary | ICD-10-CM

## 2016-12-29 DIAGNOSIS — R300 Dysuria: Secondary | ICD-10-CM

## 2016-12-29 LAB — POC URINALSYSI DIPSTICK (AUTOMATED)
Bilirubin, UA: NEGATIVE
Glucose, UA: NEGATIVE
KETONES UA: NEGATIVE
Nitrite, UA: NEGATIVE
PH UA: 6 (ref 5.0–8.0)
Spec Grav, UA: >=1.03 — AB (ref 1.010–1.025)
Urobilinogen, UA: 0.2 E.U./dL

## 2016-12-29 MED ORDER — NITROFURANTOIN MONOHYD MACRO 100 MG PO CAPS: 100.0000 mg | ORAL_CAPSULE | Freq: Two times a day (BID) | ORAL | 0 refills | Status: DC

## 2016-12-29 NOTE — Progress Notes (Signed)
Pre visit review using our clinic review tool, if applicable. No additional management support is needed unless otherwise documented below in the visit note. 

## 2016-12-29 NOTE — Progress Notes (Signed)
   Subjective:    Patient ID: Monique [name], female    DOB: 1936/08/14, 81 y.o.   MRN: [id number]  HPI The patient is a 81 YO female coming in for continuing UTI symptoms. She did take the fosfomycin as directed and did not feel better. She did not call us back but waited until coming back. She is still having burning with urination and frequency with small amounts of urine. She denies fevers or chills. No back or flank pain. Some suprapubic pressure. She has taken several different antibiotics for this same infection. No instrumentation or procedures done recently and denies recurrent infections in the past.   Review of Systems  Constitutional: Negative.   Respiratory: Negative.   Cardiovascular: Negative.   Gastrointestinal: Positive for abdominal pain. Negative for abdominal distention, constipation, diarrhea, nausea and vomiting.  Genitourinary: Positive for dysuria, frequency and urgency. Negative for difficulty urinating, enuresis, flank pain, hematuria and pelvic pain.  Musculoskeletal: Negative.   Neurological: Negative.       Objective:   Physical Exam  Constitutional: She is oriented to person, place, and time. She appears well-developed and well-nourished.  HENT:  Head: Normocephalic and atraumatic.  Eyes: EOM are normal.  Neck: Normal range of motion.  Cardiovascular: Normal rate and regular rhythm.   Pulmonary/Chest: Effort normal.  Abdominal: Soft. She exhibits no distension. There is no tenderness. There is no rebound.  Minimal suprapubic tenderness  Neurological: She is alert and oriented to person, place, and time.  Skin: Skin is warm and dry.   Vitals:   12/29/16 1607  BP: (!) 142/90  Pulse: 72  Resp: 12  Temp: 98 F (36.7 C)  TempSrc: Oral  SpO2: 98%  Weight: 179 lb (81.2 kg)  Height: 5\' 5"  (1.651 m)      Assessment & Plan:

## 2016-12-29 NOTE — Patient Instructions (Signed)
We have sent in the macrobid (nitrofurantoin) to clear the infection. Take 1 pill twice a day for 1 week.   We are checking a culture so we can see if this is the same bacteria from before.   If this does not clear call us back and we will get you in with a urologist to check why the infection is not clearing.

## 2016-12-30 NOTE — Assessment & Plan Note (Signed)
She has signs of UTI on U/A done in the office. Sending for culture as prior culture was sensitive to fosfomycin. If she is not able to clear the infection with appropriate treatment this time will refer to urology for evaluation of seeding source. Rx for macrobid.

## 2017-01-01 LAB — URINE CULTURE

## 2017-01-11 NOTE — Progress Notes (Signed)
Cardiology Office Note   Date:  01/13/2017   ID:  Monique [name], DOB 01-30-1936, MRN [id number]  PCP:  Hoyt Koch, MD  Cardiologist:   Dorris Carnes, MD   Pt presents for f/u of HOCM, HTN and PAF   History of Present Illness: Monique [name] is a 81 y.o. female with a history of HOCM, HTN, PAF, bilateral PE, HL  I saw her in March 2018 At that time BP was 160/95   She came back for nurses check BP was 164/100  Amlodipine was increased to bid   Pt denies SOB  No dizziness  No CP  No palpitations    Current Meds  Medication Sig  . amLODipine (NORVASC) 5 MG tablet Take 1 tablet (5 mg total) by mouth 2 (two) times daily.  . disopyramide (NORPACE) 150 MG capsule Take 1 capsule (150 mg total) by mouth 2 (two) times daily.  . metoprolol tartrate (LOPRESSOR) 25 MG tablet Take 1 tablet (25 mg total) by mouth 2 (two) times daily.  . mirtazapine (REMERON) 15 MG tablet TAKE 1 TABLET (15 MG TOTAL) BY MOUTH AT BEDTIME.  . nitroGLYCERIN (NITROSTAT) 0.4 MG SL tablet Place 1 tablet (0.4 mg total) under the tongue every 5 (five) minutes as needed for chest pain.  . pantoprazole (PROTONIX) 40 MG tablet TAKE 1 TABLET EVERY DAY  . rivaroxaban (XARELTO) 20 MG TABS tablet TAKE 1 TABLET (20 MG TOTAL) BY MOUTH DAILY WITH SUPPER.  . sertraline (ZOLOFT) 50 MG tablet TAKE 1 TABLET (50 MG TOTAL) BY MOUTH DAILY.  . simvastatin (ZOCOR) 40 MG tablet TAKE 1 TABLET (40 MG TOTAL) BY MOUTH AT BEDTIME.  . [DISCONTINUED] nitrofurantoin, macrocrystal-monohydrate, (MACROBID) 100 MG capsule Take 1 capsule (100 mg total) by mouth 2 (two) times daily.  . [DISCONTINUED] XARELTO 20 MG TABS tablet TAKE 1 TABLET BY MOUTH EVERY DAY WITH SUPPER     Allergies:   Patient has no known allergies.   Past Medical History:  Diagnosis Date  . CHF (congestive heart failure) ([place])   . Chronic anticoagulation 06/2014   Coumadin started after bilateral PE  . Clotting disorder ([place])    right was removed, still have  left cataract  . Fatty liver   . Heart murmur   . HTN (hypertension)    Essential  . Hyperlipidemia   . Kidney stones   . LVH (left ventricular hypertrophy)   . PAF (paroxysmal atrial fibrillation) ([place]) 2000  . PE (pulmonary embolism) 06/2014   Bilateral  . Subarachnoid hemorrhage ([place]) 2000    Past Surgical History:  Procedure Laterality Date  . ABDOMINAL HYSTERECTOMY  2000  . CARDIAC CATHETERIZATION  05/2008   Nl cors, no gradient, EF 60%     Social History:  The patient  reports that she quit smoking about 49 years ago. She has never used smokeless tobacco. She reports that she does not drink alcohol or use drugs.   Family History:  The patient's family history includes Arrhythmia in her mother and sister; Breast cancer in her daughter and mother; Heart disease in her mother; Stroke in her mother.    ROS:  Please see the history of present illness. All other systems are reviewed and  Negative to the above problem except as noted.    PHYSICAL EXAM: VS:  BP 134/80 (BP Location: Right Arm, Patient Position: Sitting, Cuff Size: Large)   Pulse (!) 58   Ht 5\' 5"  (1.651 m)   Wt 177 lb (80.3 kg)  SpO2 95%   BMI 29.45 kg/m   GEN: Well nourished, well developed, in no acute distress  HEENT: normal  Neck: no JVD, carotid bruits, or masses Cardiac: RRR; no murmurs, rubs, or gallops,no edema  Respiratory:  clear to auscultation bilaterally, normal work of breathing GI: soft, nontender, nondistended, + BS  No hepatomegaly  MS: no deformity Moving all extremities   Skin: warm and dry, no rash Neuro:  Strength and sensation are intact Psych: euthymic mood, full affect   EKG:  EKG is not  ordered today.   Lipid Panel    Component Value Date/Time   CHOL 140 11/11/2016 0900   TRIG 118 11/11/2016 0900   HDL 51 11/11/2016 0900   CHOLHDL 2.7 11/11/2016 0900   CHOLHDL 3 12/24/2012 0925   VLDL 17.6 12/24/2012 0925   LDLCALC 65 11/11/2016 0900      Wt Readings from Last  3 Encounters:  01/13/17 177 lb (80.3 kg)  12/29/16 179 lb (81.2 kg)  12/15/16 179 lb (81.2 kg)      ASSESSMENT AND PLAN:  1  HTN  BP is better  Tolerating amlodipine 5 bid  Continue   2  HOCM Stable     3  Hx PE  Continue Xarelto    4.  HL Keep on simvistatin    5  PAF  Clinically in SR       Current medicines are reviewed at length with the patient today.  The patient does not have concerns regarding medicines.  Signed, Dorris Carnes, MD  01/13/2017 10:00 AM    [address], [place], [place]  [address] Phone: [phone]; Fax: [phone]

## 2017-01-13 ENCOUNTER — Encounter: Payer: Self-pay | Admitting: Internal Medicine

## 2017-01-13 ENCOUNTER — Encounter (INDEPENDENT_AMBULATORY_CARE_PROVIDER_SITE_OTHER): Payer: Self-pay

## 2017-01-13 ENCOUNTER — Ambulatory Visit (INDEPENDENT_AMBULATORY_CARE_PROVIDER_SITE_OTHER): Payer: Medicare Other | Admitting: Internal Medicine

## 2017-01-13 VITALS — BP 134/80 | HR 58 | Ht 65.0 in | Wt 177.0 lb

## 2017-01-13 DIAGNOSIS — I480 Paroxysmal atrial fibrillation: Secondary | ICD-10-CM | POA: Diagnosis not present

## 2017-01-13 DIAGNOSIS — I421 Obstructive hypertrophic cardiomyopathy: Secondary | ICD-10-CM

## 2017-01-13 DIAGNOSIS — I10 Essential (primary) hypertension: Secondary | ICD-10-CM | POA: Diagnosis not present

## 2017-01-13 LAB — BASIC METABOLIC PANEL
BUN/Creatinine Ratio: 9 — ABNORMAL LOW (ref 12–28)
BUN: 13 mg/dL (ref 8–27)
CO2: 24 mmol/L (ref 18–29)
CREATININE: 1.37 mg/dL — AB (ref 0.57–1.00)
Calcium: 9.5 mg/dL (ref 8.7–10.3)
Chloride: 102 mmol/L (ref 96–106)
GFR calc non Af Amer: 37 mL/min/{1.73_m2} — ABNORMAL LOW (ref >59)
GFR, EST AFRICAN AMERICAN: 43 mL/min/{1.73_m2} — AB (ref >59)
Glucose: 165 mg/dL — ABNORMAL HIGH (ref 65–99)
Potassium: 3.1 mmol/L — ABNORMAL LOW (ref 3.5–5.2)
Sodium: 142 mmol/L (ref 134–144)

## 2017-01-13 MED ORDER — DISOPYRAMIDE PHOSPHATE 150 MG PO CAPS: 150.0000 mg | ORAL_CAPSULE | Freq: Two times a day (BID) | ORAL | 11 refills | Status: DC

## 2017-01-13 MED ORDER — SIMVASTATIN 40 MG PO TABS: ORAL_TABLET | ORAL | 3 refills | Status: DC

## 2017-01-13 NOTE — Patient Instructions (Signed)
Your physician recommends that you continue on your current medications as directed. Please refer to the Current Medication list given to you today.  Your physician recommends that you return for lab work today (bmet)  Your physician wants you to follow-up in: 6 months with Dr. Harrington Challenger.  You will receive a reminder letter in the mail two months in advance. If you don't receive a letter, please call our office to schedule the follow-up appointment.

## 2017-01-17 ENCOUNTER — Telehealth: Payer: Self-pay | Admitting: Internal Medicine

## 2017-01-17 MED ORDER — RIVAROXABAN 15 MG PO TABS: 15.0000 mg | ORAL_TABLET | Freq: Every day | ORAL | 11 refills | Status: DC

## 2017-01-17 NOTE — Telephone Encounter (Signed)
Notes recorded by Leeroy Bock, RPH on 01/16/2017 at 7:24 AM EDT CrCl is 57mL/min using actually body weight for DOAC dosing. Ideally should be lowered to Xarelto 15mg  with evening meal with repeat BMET in 3 months.   Spoke with patient yesterday about this and informed.   [facility]

## 2017-01-17 NOTE — Telephone Encounter (Signed)
New message    Pt c/o medication issue:  1. Name of Medication: rivaroxaban (XARELTO) 20 MG TABS tablet  2. How are you currently taking this medication (dosage and times per day)? 20MG   3. Are you having a reaction (difficulty breathing--STAT)? none  4. What is your medication issue? Pt states that he dosage was supposed to be changed and you stated that you were going to fix this information for her with her pharmacy by today. She states it has not been done and requests a call back asap.

## 2017-02-02 ENCOUNTER — Other Ambulatory Visit: Payer: Self-pay | Admitting: Internal Medicine

## 2017-03-23 DIAGNOSIS — H25812 Combined forms of age-related cataract, left eye: Secondary | ICD-10-CM | POA: Diagnosis not present

## 2017-03-29 DIAGNOSIS — H35033 Hypertensive retinopathy, bilateral: Secondary | ICD-10-CM | POA: Diagnosis not present

## 2017-03-29 DIAGNOSIS — H2512 Age-related nuclear cataract, left eye: Secondary | ICD-10-CM | POA: Diagnosis not present

## 2017-03-29 DIAGNOSIS — H35363 Drusen (degenerative) of macula, bilateral: Secondary | ICD-10-CM | POA: Diagnosis not present

## 2017-03-29 DIAGNOSIS — H2589 Other age-related cataract: Secondary | ICD-10-CM | POA: Diagnosis not present

## 2017-03-29 DIAGNOSIS — H26491 Other secondary cataract, right eye: Secondary | ICD-10-CM | POA: Diagnosis not present

## 2017-04-12 ENCOUNTER — Ambulatory Visit (INDEPENDENT_AMBULATORY_CARE_PROVIDER_SITE_OTHER): Payer: Medicare Other | Admitting: Nurse Practitioner

## 2017-04-12 ENCOUNTER — Other Ambulatory Visit: Payer: Medicare Other

## 2017-04-12 VITALS — BP 160/100 | HR 61 | Temp 98.0°F | Wt 178.0 lb

## 2017-04-12 DIAGNOSIS — I10 Essential (primary) hypertension: Secondary | ICD-10-CM | POA: Diagnosis not present

## 2017-04-12 DIAGNOSIS — R51 Headache: Secondary | ICD-10-CM | POA: Diagnosis not present

## 2017-04-12 DIAGNOSIS — R519 Headache, unspecified: Secondary | ICD-10-CM

## 2017-04-13 ENCOUNTER — Other Ambulatory Visit (INDEPENDENT_AMBULATORY_CARE_PROVIDER_SITE_OTHER): Payer: Medicare Other

## 2017-04-13 ENCOUNTER — Telehealth: Payer: Self-pay

## 2017-04-13 DIAGNOSIS — R51 Headache: Secondary | ICD-10-CM

## 2017-04-13 DIAGNOSIS — R519 Headache, unspecified: Secondary | ICD-10-CM

## 2017-04-13 LAB — CBC WITH DIFFERENTIAL/PLATELET
BASOS ABS: 0.1 10*3/uL (ref 0.0–0.1)
BASOS PCT: 1.1 % (ref 0.0–3.0)
EOS ABS: 0.1 10*3/uL (ref 0.0–0.7)
Eosinophils Relative: 0.8 % (ref 0.0–5.0)
HCT: 42.4 % (ref 36.0–46.0)
Hemoglobin: 14 g/dL (ref 12.0–15.0)
LYMPHS ABS: 5.5 10*3/uL — AB (ref 0.7–4.0)
Lymphocytes Relative: 53.6 % — ABNORMAL HIGH (ref 12.0–46.0)
MCHC: 33.1 g/dL (ref 30.0–36.0)
MCV: 95.1 fl (ref 78.0–100.0)
MONOS PCT: 6.1 % (ref 3.0–12.0)
Monocytes Absolute: 0.6 10*3/uL (ref 0.1–1.0)
NEUTROS ABS: 4 10*3/uL (ref 1.4–7.7)
NEUTROS PCT: 38.4 % — AB (ref 43.0–77.0)
PLATELETS: 200 10*3/uL (ref 150.0–400.0)
RBC: 4.46 Mil/uL (ref 3.87–5.11)
RDW: 13.6 % (ref 11.5–15.5)
WBC: 10.3 10*3/uL (ref 4.0–10.5)

## 2017-04-13 LAB — SEDIMENTATION RATE: Sed Rate: 6 mm/hr (ref 0–30)

## 2017-04-13 NOTE — Telephone Encounter (Signed)
error 

## 2017-04-17 ENCOUNTER — Encounter: Payer: Self-pay | Admitting: Nurse Practitioner

## 2017-04-17 NOTE — Progress Notes (Signed)
Subjective:  Patient ID: Monique [name], female    DOB: Oct 20, 1935  Age: 81 y.o. MRN: [id number]  CC: Headache (x 5days, intermittent)   Headache   This is a new problem. The current episode started in the past 7 days. The problem occurs intermittently. The problem has been resolved. The pain is located in the left unilateral region. The pain does not radiate. The pain quality is not similar to prior headaches. The quality of the pain is described as sharp. Associated symptoms include scalp tenderness. Pertinent negatives include no abnormal behavior, anorexia, blurred vision, dizziness, eye pain, eye watering, hearing loss, insomnia, muscle aches, nausea, neck pain, numbness, photophobia, rhinorrhea, sinus pressure, tingling, tinnitus, visual change or weakness. Nothing aggravates the symptoms. She has tried nothing for the symptoms. Her past medical history is significant for hypertension. There is no history of immunosuppression, migraine headaches, recent head traumas, sinus disease or TMJ.   HTN: reports home SBP reading ranging from 120s-130s. Current use of amlodipine as prescribed.  Outpatient Medications Prior to Visit  Medication Sig Dispense Refill  . amLODipine (NORVASC) 5 MG tablet Take 1 tablet (5 mg total) by mouth 2 (two) times daily. 60 tablet 11  . disopyramide (NORPACE) 150 MG capsule Take 1 capsule (150 mg total) by mouth 2 (two) times daily. 60 capsule 11  . metoprolol tartrate (LOPRESSOR) 25 MG tablet Take 1 tablet (25 mg total) by mouth 2 (two) times daily. 60 tablet 11  . mirtazapine (REMERON) 15 MG tablet TAKE 1 TABLET (15 MG TOTAL) BY MOUTH AT BEDTIME. 30 tablet 3  . nitroGLYCERIN (NITROSTAT) 0.4 MG SL tablet Place 1 tablet (0.4 mg total) under the tongue every 5 (five) minutes as needed for chest pain. 25 tablet 3  . pantoprazole (PROTONIX) 40 MG tablet TAKE 1 TABLET EVERY DAY 30 tablet 3  . Rivaroxaban (XARELTO) 15 MG TABS tablet Take 1 tablet (15 mg total) by  mouth daily with supper. 30 tablet 11  . sertraline (ZOLOFT) 50 MG tablet TAKE 1 TABLET (50 MG TOTAL) BY MOUTH DAILY. 90 tablet 3  . simvastatin (ZOCOR) 40 MG tablet TAKE 1 TABLET (40 MG TOTAL) BY MOUTH AT BEDTIME. 90 tablet 3   No facility-administered medications prior to visit.     ROS Review of Systems  Constitutional: Negative.   HENT: Negative for hearing loss, rhinorrhea, sinus pressure and tinnitus.   Eyes: Negative for blurred vision, photophobia and pain.  Gastrointestinal: Negative.  Negative for anorexia and nausea.  Musculoskeletal: Negative.  Negative for neck pain.  Neurological: Positive for headaches. Negative for dizziness, tingling, weakness and numbness.  Psychiatric/Behavioral: Negative.  The patient does not have insomnia.      Objective:  BP (!) 160/100   Pulse 61   Temp 98 F (36.7 C)   Wt 178 lb (80.7 kg)   SpO2 98%   BMI 29.62 kg/m   BP Readings from Last 3 Encounters:  04/12/17 (!) 160/100  01/13/17 134/80  12/29/16 (!) 142/90    Wt Readings from Last 3 Encounters:  04/12/17 178 lb (80.7 kg)  01/13/17 177 lb (80.3 kg)  12/29/16 179 lb (81.2 kg)    Physical Exam  Constitutional: She is oriented to person, place, and time. No distress.  HENT:  Right Ear: Tympanic membrane, external ear and ear canal normal.  Left Ear: Tympanic membrane, external ear and ear canal normal.  Mouth/Throat: Uvula is midline, oropharynx is clear and moist and mucous membranes are normal.  Eyes: No  scleral icterus.  Neck: Normal range of motion. Neck supple. No thyromegaly present.  Cardiovascular: Normal rate and regular rhythm.   Pulmonary/Chest: Effort normal and breath sounds normal.  Musculoskeletal: Normal range of motion. She exhibits no edema.  Lymphadenopathy:    She has no cervical adenopathy.  Neurological: She is alert and oriented to person, place, and time. No cranial nerve deficit. Coordination normal.  Skin: Skin is warm and dry. No rash noted.  No erythema.  Psychiatric: She has a normal mood and affect. Her behavior is normal.  Vitals reviewed.   Lab Results  Component Value Date   WBC 10.3 04/13/2017   HGB 14.0 04/13/2017   HCT 42.4 04/13/2017   PLT 200.0 04/13/2017   GLUCOSE 165 (H) 01/13/2017   CHOL 140 11/11/2016   TRIG 118 11/11/2016   HDL 51 11/11/2016   LDLCALC 65 11/11/2016   ALT 8 11/18/2015   AST 17 11/18/2015   NA 142 01/13/2017   K 3.1 (L) 01/13/2017   CL 102 01/13/2017   CREATININE 1.37 (H) 01/13/2017   BUN 13 01/13/2017   CO2 24 01/13/2017   TSH 0.91 11/20/2014   INR 2.0 (H) 11/18/2015    US Abdomen Limited Ruq  Result Date: 12/02/2015 CLINICAL DATA:  Abdominal pain. EXAM: US ABDOMEN LIMITED - RIGHT UPPER QUADRANT COMPARISON:  CT 01/09/2013. FINDINGS: Gallbladder: No gallstones or wall thickening visualized. No sonographic Murphy sign noted by sonographer. Common bile duct: Diameter: 4.7 mm Liver: Heterogeneous hepatic echotexture consistent fatty infiltration and/or hepatocellular disease. No focal hepatic abnormality identified. IMPRESSION: Heterogeneous hepatic echotexture consistent with fatty infiltration and/or hepatocellular disease. No focal abnormality identified. No gallstones or biliary distention. Electronically Signed   By: Marcello Moores  Register   On: 12/02/2015 10:44    Assessment & Plan:   Saidy was seen today for headache.  Diagnoses and all orders for this visit:  Acute nonintractable headache, unspecified headache type  Benign essential HTN   I am having Ms. Gemmill maintain her sertraline, nitroGLYCERIN, amLODipine, metoprolol tartrate, mirtazapine, disopyramide, simvastatin, Rivaroxaban, and pantoprazole.  No orders of the defined types were placed in this encounter.   Follow-up: Return if symptoms worsen or fail to improve.  Wilfred Lacy, NP

## 2017-04-17 NOTE — Patient Instructions (Addendum)
Headache: CBC and ESR ordered. Use tylenol as directed on package. Normal ESR and CBC: less likely related to temporal arteritis. No indication of CVA at this time.  HTN: check BP at home once a day. Call office if BP >150/80.  Encourage adequate oral hydration.  General Headache Without Cause A headache is pain or discomfort felt around the head or neck area. There are many causes and types of headaches. In some cases, the cause may not be found. Follow these instructions at home: Managing pain  Take over-the-counter and prescription medicines only as told by your doctor.  Lie down in a dark, quiet room when you have a headache.  If directed, apply ice to the head and neck area: ? Put ice in a plastic bag. ? Place a towel between your skin and the bag. ? Leave the ice on for 20 minutes, 2-3 times per day.  Use a heating pad or hot shower to apply heat to the head and neck area as told by your doctor.  Keep lights dim if bright lights bother you or make your headaches worse. Eating and drinking  Eat meals on a regular schedule.  Lessen how much alcohol you drink.  Lessen how much caffeine you drink, or stop drinking caffeine. General instructions  Keep all follow-up visits as told by your doctor. This is important.  Keep a journal to find out if certain things bring on headaches. For example, write down: ? What you eat and drink. ? How much sleep you get. ? Any change to your diet or medicines.  Relax by getting a massage or doing other relaxing activities.  Lessen stress.  Sit up straight. Do not tighten (tense) your muscles.  Do not use tobacco products. This includes cigarettes, chewing tobacco, or e-cigarettes. If you need help quitting, ask your doctor.  Exercise regularly as told by your doctor.  Get enough sleep. This often means 7-9 hours of sleep. Contact a doctor if:  Your symptoms are not helped by medicine.  You have a headache that feels different  than the other headaches.  You feel sick to your stomach (nauseous) or you throw up (vomit).  You have a fever. Get help right away if:  Your headache becomes really bad.  You keep throwing up.  You have a stiff neck.  You have trouble seeing.  You have trouble speaking.  You have pain in the eye or ear.  Your muscles are weak or you lose muscle control.  You lose your balance or have trouble walking.  You feel like you will pass out (faint) or you pass out.  You have confusion. This information is not intended to replace advice given to you by your health care provider. Make sure you discuss any questions you have with your health care provider. Document Released: 05/31/2008 Document Revised: 01/28/2016 Document Reviewed: 12/15/2014 Elsevier Interactive Patient Education  [employer].

## 2017-04-18 DIAGNOSIS — H2512 Age-related nuclear cataract, left eye: Secondary | ICD-10-CM | POA: Diagnosis not present

## 2017-04-18 DIAGNOSIS — H25812 Combined forms of age-related cataract, left eye: Secondary | ICD-10-CM | POA: Diagnosis not present

## 2017-06-07 ENCOUNTER — Other Ambulatory Visit: Payer: Self-pay | Admitting: Internal Medicine

## 2017-06-20 ENCOUNTER — Ambulatory Visit (INDEPENDENT_AMBULATORY_CARE_PROVIDER_SITE_OTHER): Payer: Medicare Other | Admitting: Internal Medicine

## 2017-06-20 ENCOUNTER — Encounter: Payer: Self-pay | Admitting: Internal Medicine

## 2017-06-20 VITALS — BP 120/90 | HR 53 | Temp 97.6°F | Ht 65.0 in | Wt 174.0 lb

## 2017-06-20 DIAGNOSIS — J3089 Other allergic rhinitis: Secondary | ICD-10-CM | POA: Diagnosis not present

## 2017-06-20 DIAGNOSIS — Z23 Encounter for immunization: Secondary | ICD-10-CM | POA: Diagnosis not present

## 2017-06-20 DIAGNOSIS — J309 Allergic rhinitis, unspecified: Secondary | ICD-10-CM | POA: Insufficient documentation

## 2017-06-20 DIAGNOSIS — I671 Cerebral aneurysm, nonruptured: Secondary | ICD-10-CM

## 2017-06-20 DIAGNOSIS — I10 Essential (primary) hypertension: Secondary | ICD-10-CM | POA: Diagnosis not present

## 2017-06-20 MED ORDER — MIRTAZAPINE 15 MG PO TABS: ORAL_TABLET | ORAL | 3 refills | Status: DC

## 2017-06-20 MED ORDER — MONTELUKAST SODIUM 10 MG PO TABS: 10.0000 mg | ORAL_TABLET | Freq: Every day | ORAL | 0 refills | Status: DC

## 2017-06-20 NOTE — Assessment & Plan Note (Signed)
Most recent imaging 2016 stable for some time. This is not indication for recheck as it is related to sinuses.

## 2017-06-20 NOTE — Assessment & Plan Note (Signed)
Rx for singulair for the congestion which is likely causing the headaches.

## 2017-06-20 NOTE — Assessment & Plan Note (Signed)
BP at goal on her metoprolol, amlodipine. Recent BMP without indication for change.

## 2017-06-20 NOTE — Progress Notes (Signed)
   Subjective:    Patient ID: Monique [name], female    DOB: 12-10-1935, 81 y.o.   MRN: [id number]  HPI The patient is a 81 YO female coming in for headache. Started about 3 days ago and she took tylenol at the onset. This helped some. She is having some nasal drainage and pressure. The pain is behind right eye. Denies fevers or chills. Overall improved.   Review of Systems  Constitutional: Negative.   HENT: Positive for congestion and rhinorrhea. Negative for ear discharge, ear pain, postnasal drip, sinus pain, sinus pressure, sore throat, trouble swallowing and voice change.   Eyes: Negative.   Respiratory: Negative for cough, chest tightness and shortness of breath.   Cardiovascular: Negative for chest pain, palpitations and leg swelling.  Gastrointestinal: Negative for abdominal distention, abdominal pain, constipation, diarrhea, nausea and vomiting.  Musculoskeletal: Negative.   Skin: Negative.   Neurological: Positive for headaches. Negative for dizziness, tremors, syncope, weakness and light-headedness.      Objective:   Physical Exam  Constitutional: She is oriented to person, place, and time. She appears well-developed and well-nourished.  HENT:  Head: Normocephalic and atraumatic.  No temporal tenderness  Eyes: EOM are normal.  Neck: Normal range of motion.  Cardiovascular: Normal rate and regular rhythm.   Pulmonary/Chest: Effort normal and breath sounds normal. No respiratory distress. She has no wheezes. She has no rales.  Abdominal: Soft. Bowel sounds are normal. She exhibits no distension. There is no tenderness. There is no rebound.  Musculoskeletal: She exhibits no edema.  Neurological: She is alert and oriented to person, place, and time. Coordination normal.  Skin: Skin is warm and dry.  Psychiatric: She has a normal mood and affect.   Vitals:   06/20/17 1104  BP: 120/90  Pulse: (!) 53  Temp: 97.6 F (36.4 C)  TempSrc: Oral  SpO2: 99%  Weight: 174 lb  (78.9 kg)  Height: 5\' 5"  (1.651 m)      Assessment & Plan:  Flu and tdap given at visit

## 2017-06-20 NOTE — Patient Instructions (Signed)
We have sent in singulair for the headaches to take 1 pill in the evening for 1-2 weeks. This should help with drainage and allergies.

## 2017-06-21 ENCOUNTER — Telehealth: Payer: Self-pay | Admitting: *Deleted

## 2017-06-21 DIAGNOSIS — I480 Paroxysmal atrial fibrillation: Secondary | ICD-10-CM

## 2017-06-21 NOTE — Telephone Encounter (Signed)
Pt due for f/u BMET to recheck creatinine clearance. (on xarelto)  Has appointment with APP on 10/29. Left message for her to call back.

## 2017-06-21 NOTE — Telephone Encounter (Signed)
Patient will come next Tue 10/23 for BMET to check creatinine clearance.  Currently taking Xarelto 15 mg every day.

## 2017-06-21 NOTE — Telephone Encounter (Signed)
06-21-17 pt rtn call to [facility]

## 2017-06-27 ENCOUNTER — Other Ambulatory Visit: Payer: Medicare Other

## 2017-06-27 DIAGNOSIS — I480 Paroxysmal atrial fibrillation: Secondary | ICD-10-CM

## 2017-06-27 LAB — BASIC METABOLIC PANEL
BUN/Creatinine Ratio: 12 (ref 12–28)
BUN: 17 mg/dL (ref 8–27)
CALCIUM: 9.1 mg/dL (ref 8.7–10.3)
CO2: 23 mmol/L (ref 20–29)
CREATININE: 1.46 mg/dL — AB (ref 0.57–1.00)
Chloride: 101 mmol/L (ref 96–106)
GFR calc Af Amer: 39 mL/min/{1.73_m2} — ABNORMAL LOW (ref >59)
GFR, EST NON AFRICAN AMERICAN: 34 mL/min/{1.73_m2} — AB (ref >59)
Glucose: 205 mg/dL — ABNORMAL HIGH (ref 65–99)
Potassium: 3.5 mmol/L (ref 3.5–5.2)
SODIUM: 141 mmol/L (ref 134–144)

## 2017-06-30 ENCOUNTER — Telehealth: Payer: Self-pay | Admitting: Physician Assistant

## 2017-06-30 NOTE — Telephone Encounter (Signed)
Follow Up:; ° ° °Returning your call. °

## 2017-07-01 NOTE — Progress Notes (Signed)
Cardiology Office Note    Date:  07/03/2017   ID:  Monique, [name] Feb 13, 1936, MRN [id number]  PCP:  Monique Koch, MD  Cardiologist: Dr. Harrington [name]  Chief Complaint: 6 months follow up for HOCM & Afib  History of Present Illness:   Monique [name] is a 81 y.o. female with hx of HOCM, HTN, PAF, bilateral PE, DVT, CKD stage III and HLD presents for follow up.   Last echo 11/2014 showed LVEF of 65-70%, dynamic obstruction, severe LVH,  grade 1 DD, mild MR. Low risk stress test 11/2014.  She was doing well on cardiac stand point when last seen by Dr. Harrington [name] 01/2017.  Here today for follow up.  Recently dealing with headache, seen by PCP and felt due to congestion.  Patient has also noted intermittent orthopnea and LE edema (left)  without PND or syncope. She has Left knee pain. She denies chest pain, dizziness, shortness of breath, melena or blood in her stool or urine.   Past Medical History:  Diagnosis Date  . Abdominal pain 11/18/2015  . Acute respiratory failure with hypoxia ([place]) 06/27/2014  . Breast pain, left 10/11/2016  . Cerebral aneurysm 2000 06/27/2014  . Chest pain 10/30/2014  . CHF (congestive heart failure) ([place])   . Chronic anticoagulation 06/2014   Coumadin started after bilateral PE  . Chronic kidney disease, stage III (moderate) ([place]) 11/21/2014  . Clotting disorder ([place])    right was removed, still have left cataract  . Dizziness and giddiness 11/28/2014  . DVT of lower extremity (deep venous thrombosis) ([place]) 11/28/2014  . Dysuria 07/07/2014  . Elevated troponin 11/28/2014  . Fatty liver   . Gastritis 05/16/2012   Chronic dyspepsia on PPI daily.   Marland Kitchen Headache, acute 06/08/2010   Qualifier: Diagnosis of  By: Linda Hedges MD, Heinz Knuckles   . Heart murmur   . Hematochezia 11/18/2015  . History of fibromuscular dysplasia 06/27/2014  . HTN (hypertension)    Essential  . Hyperlipidemia   . Hypertrophic obstructive cardiomyopathy (HOCM) ([place]) 05/20/2008   Qualifier:  Diagnosis of  By: Linda Hedges MD, Heinz Knuckles   . Kidney stones   . LVH (left ventricular hypertrophy)   . Orthostatic hypotension 11/28/2014  . PAF (paroxysmal atrial fibrillation) ([place]) 2000  . Palpitations 11/28/2014  . PAROXYSMAL ATRIAL FIBRILLATION 05/20/2008   Qualifier: Diagnosis of  By: Linda Hedges MD, Heinz Knuckles   . PE (pulmonary embolism) 06/2014   Bilateral  . Pulmonary embolism, bilateral ([place]) 06/27/2014  . SOB (shortness of breath) 06/27/2014  . Subarachnoid hemorrhage ([place]) 2000  . UTI (urinary tract infection) 01/04/2015    Past Surgical History:  Procedure Laterality Date  . ABDOMINAL HYSTERECTOMY  2000  . CARDIAC CATHETERIZATION  05/2008   Nl cors, no gradient, EF 60%    Current Medications: Prior to Admission medications   Medication Sig Start Date End Date Taking? Authorizing Provider  amLODipine (NORVASC) 5 MG tablet Take 1 tablet (5 mg total) by mouth 2 (two) times daily. 12/02/16   Fay Records, MD  disopyramide (NORPACE) 150 MG capsule Take 1 capsule (150 mg total) by mouth 2 (two) times daily. 01/13/17   Fay Records, MD  metoprolol tartrate (LOPRESSOR) 25 MG tablet Take 1 tablet (25 mg total) by mouth 2 (two) times daily. 12/06/16   Fay Records, MD  mirtazapine (REMERON) 15 MG tablet TAKE 1 TABLET (15 MG TOTAL) BY MOUTH AT BEDTIME. 06/20/17   Monique Koch, MD  montelukast Laurine Blazer)  10 MG tablet Take 1 tablet (10 mg total) by mouth at bedtime. 06/20/17   Monique Koch, MD  nitroGLYCERIN (NITROSTAT) 0.4 MG SL tablet Place 1 tablet (0.4 mg total) under the tongue every 5 (five) minutes as needed for chest pain. 03/30/15   Fay Records, MD  pantoprazole (PROTONIX) 40 MG tablet Take 1 tablet (40 mg total) by mouth daily. Need annual visit of further refills 06/07/17   Monique Koch, MD  Rivaroxaban (XARELTO) 15 MG TABS tablet Take 1 tablet (15 mg total) by mouth daily with supper. 01/17/17   Fay Records, MD  sertraline (ZOLOFT) 50 MG tablet TAKE 1  TABLET (50 MG TOTAL) BY MOUTH DAILY. 02/24/15   Monique Koch, MD  simvastatin (ZOCOR) 40 MG tablet TAKE 1 TABLET (40 MG TOTAL) BY MOUTH AT BEDTIME. 01/13/17   Fay Records, MD    Allergies:   Patient has no known allergies.   Social History   Social History  . Marital status: Divorced    Spouse name: N/A  . Number of children: 3  . Years of education: 12   Occupational History  . Retired [employer]    Social History Main Topics  . Smoking status: Former Smoker    Quit date: 09/06/1967  . Smokeless tobacco: Never Used  . Alcohol use No  . Drug use: No  . Sexual activity: Not Currently   Other Topics Concern  . None   Social History Narrative   HSG. Married '61 - '88/divorced. 3 sons - '74, '59, '62, 1 daughter '63; 5 grandchildren. Lives alone in two story home. She does not drive.   Retired from [employer].     Family History:  The patient's family history includes Arrhythmia in her mother and sister; Breast cancer in her daughter and mother; Heart disease in her mother; Stroke in her mother.   ROS:   Please see the history of present illness.    ROS All other systems reviewed and are negative.   PHYSICAL EXAM:   VS:  BP 140/90   Pulse 60   Ht 5\' 5"  (1.651 m)   Wt 176 lb 12.8 oz (80.2 kg)   SpO2 95%   BMI 29.42 kg/m    GEN: Well nourished, well developed, in no acute distress  HEENT: normal  Neck: no JVD, carotid bruits, or masses Cardiac: RRR; no murmurs, rubs, or gallops, Trace to 1 + LL edema  Respiratory:  clear to auscultation bilaterally, normal work of breathing GI: soft, nontender, nondistended, + BS MS: no deformity or atrophy  Skin: warm and dry, no rash Neuro:  Alert and Oriented x 3, Strength and sensation are intact Psych: euthymic mood, full affect  Wt Readings from Last 3 Encounters:  07/03/17 176 lb 12.8 oz (80.2 kg)  06/20/17 174 lb (78.9 kg)  04/12/17 178 lb (80.7 kg)      Studies/Labs Reviewed:    EKG:  EKG is not ordered today.    Recent Labs: 04/13/2017: Hemoglobin 14.0; Platelets 200.0 06/27/2017: BUN 17; Creatinine, Ser 1.46; Potassium 3.5; Sodium 141   Lipid Panel    Component Value Date/Time   CHOL 140 11/11/2016 0900   TRIG 118 11/11/2016 0900   HDL 51 11/11/2016 0900   CHOLHDL 2.7 11/11/2016 0900   CHOLHDL 3 12/24/2012 0925   VLDL 17.6 12/24/2012 0925   LDLCALC 65 11/11/2016 0900    Additional studies/ records that were reviewed today include:   Echocardiogram: 11/24/14  Study Conclusions  - Left ventricle: The cavity size was normal. Wall thickness was increased in a pattern of severe LVH. Systolic function was vigorous. The estimated ejection fraction was in the range of 65% to 70%. There was dynamic obstruction. Wall motion was normal; there were no regional wall motion abnormalities. Doppler parameters are consistent with abnormal left ventricular relaxation (grade 1 diastolic dysfunction). - Aortic valve: Valve area (VTI): 2.5 cm^2. Valve area (Vmax): 2.62 cm^2. - Mitral valve: There was mild regurgitation.    ASSESSMENT & PLAN:    1. PAF - Sinus rhythm by Exam. Denies palpitations, SOB or dizziness. Continue metoprolol and Xarelto 15mg  qd.   2. HTN - relatively stable.   3. Orthopnea - without PND or shortness of breath. Recent BMET showed minimally elevated Scr from baseline. Lung clear. Will get BNP. If elevated, short term lasix and consideration of repeat echo. Advised to cut back on salt.   4. HOCM -as above  5.  Left lower extremity edema - Patient has hx of DVT on right common femoral vein on doppler 11/2014. Likely hood of DVT is less as patient on Xarelto however on 15mg  qd. She has recent L knee pain. ? Causing to have swelling. Good pulse. Cut back salt. Elevate leg. May consider DVT evaluation if no improvement. Patients wants to hold now, I agreed.   6. Headache - Followed by PCP.  Felt likely due to congestion.   She also has history of migraine however different symptoms.  Discussed possible evaluation by neurology given history of aneurysm in 2000.  She wants to defer for now.  7. HLD - 11/11/2016: Cholesterol, Total 140; HDL 51; LDL Calculated 65; Triglycerides 118  -Continue statin.  8. CKD stage III - As above  Medication Adjustments/Labs and Tests Ordered: Current medicines are reviewed at length with the patient today.  Concerns regarding medicines are outlined above.  Medication changes, Labs and Tests ordered today are listed in the Patient Instructions below. Patient Instructions  Medication Instructions:  Your physician recommends that you continue on your current medications as directed. Please refer to the Current Medication list given to you today.  -- If you need a refill on your cardiac medications before your next appointment, please call your pharmacy. --  Labwork: BNP today  Testing/Procedures: None ordered  Follow-Up: Your physician recommends that you schedule a follow-up appointment in: 4 weeks with Dr. Harrington [name].  Thank you for choosing [facility]!!            Jarrett Soho, [place]  07/03/2017 11:07 AM    [facility] [address], [place], [place]  [address] Phone: [phone]; Fax: [phone]

## 2017-07-03 ENCOUNTER — Encounter: Payer: Self-pay | Admitting: Physician Assistant

## 2017-07-03 ENCOUNTER — Ambulatory Visit (INDEPENDENT_AMBULATORY_CARE_PROVIDER_SITE_OTHER): Payer: Medicare Other | Admitting: Physician Assistant

## 2017-07-03 ENCOUNTER — Telehealth: Payer: Self-pay | Admitting: Physician Assistant

## 2017-07-03 VITALS — BP 140/90 | HR 60 | Ht 65.0 in | Wt 176.8 lb

## 2017-07-03 DIAGNOSIS — I10 Essential (primary) hypertension: Secondary | ICD-10-CM

## 2017-07-03 DIAGNOSIS — N183 Chronic kidney disease, stage 3 unspecified: Secondary | ICD-10-CM

## 2017-07-03 DIAGNOSIS — I480 Paroxysmal atrial fibrillation: Secondary | ICD-10-CM

## 2017-07-03 DIAGNOSIS — G4489 Other headache syndrome: Secondary | ICD-10-CM

## 2017-07-03 DIAGNOSIS — E785 Hyperlipidemia, unspecified: Secondary | ICD-10-CM

## 2017-07-03 DIAGNOSIS — R0601 Orthopnea: Secondary | ICD-10-CM

## 2017-07-03 DIAGNOSIS — R600 Localized edema: Secondary | ICD-10-CM | POA: Diagnosis not present

## 2017-07-03 DIAGNOSIS — I421 Obstructive hypertrophic cardiomyopathy: Secondary | ICD-10-CM

## 2017-07-03 LAB — PRO B NATRIURETIC PEPTIDE: NT-Pro BNP: 183 pg/mL (ref 0–738)

## 2017-07-03 NOTE — Telephone Encounter (Signed)
See lab results ./cy 

## 2017-07-03 NOTE — Patient Instructions (Signed)
Medication Instructions:  Your physician recommends that you continue on your current medications as directed. Please refer to the Current Medication list given to you today.  -- If you need a refill on your cardiac medications before your next appointment, please call your pharmacy. --  Labwork: BNP today  Testing/Procedures: None ordered  Follow-Up: Your physician recommends that you schedule a follow-up appointment in: 4 weeks with Dr. Harrington Challenger.  Thank you for choosing [facility]!!

## 2017-07-03 NOTE — Telephone Encounter (Signed)
New message    Pt is returning call to nurse. She said she has an appt today at 1030.

## 2017-07-03 NOTE — Telephone Encounter (Signed)
Returned pts call and went over her lab results. Pt verbalized understanding.

## 2017-08-08 ENCOUNTER — Other Ambulatory Visit: Payer: Self-pay | Admitting: Internal Medicine

## 2017-08-09 ENCOUNTER — Other Ambulatory Visit: Payer: Self-pay | Admitting: Internal Medicine

## 2017-08-17 ENCOUNTER — Ambulatory Visit: Payer: Medicare Other | Admitting: Internal Medicine

## 2017-09-07 ENCOUNTER — Ambulatory Visit: Payer: Self-pay

## 2017-09-07 ENCOUNTER — Emergency Department (HOSPITAL_COMMUNITY): Payer: Medicare Other

## 2017-09-07 ENCOUNTER — Other Ambulatory Visit: Payer: Self-pay

## 2017-09-07 ENCOUNTER — Observation Stay (HOSPITAL_COMMUNITY)
Admission: EM | Admit: 2017-09-07 | Discharge: 2017-09-09 | Disposition: A | Discharge status: Home / Self Care | Payer: Medicare Other | Attending: Family Medicine | Admitting: Family Medicine

## 2017-09-07 ENCOUNTER — Encounter (HOSPITAL_COMMUNITY): Payer: Self-pay | Admitting: Emergency Medicine

## 2017-09-07 DIAGNOSIS — Z79899 Other long term (current) drug therapy: Secondary | ICD-10-CM | POA: Diagnosis not present

## 2017-09-07 DIAGNOSIS — Z823 Family history of stroke: Secondary | ICD-10-CM | POA: Insufficient documentation

## 2017-09-07 DIAGNOSIS — N183 Chronic kidney disease, stage 3 (moderate): Secondary | ICD-10-CM | POA: Diagnosis not present

## 2017-09-07 DIAGNOSIS — Z86718 Personal history of other venous thrombosis and embolism: Secondary | ICD-10-CM | POA: Diagnosis not present

## 2017-09-07 DIAGNOSIS — E785 Hyperlipidemia, unspecified: Secondary | ICD-10-CM | POA: Diagnosis not present

## 2017-09-07 DIAGNOSIS — I700 Atherosclerosis of aorta: Secondary | ICD-10-CM | POA: Insufficient documentation

## 2017-09-07 DIAGNOSIS — I472 Ventricular tachycardia: Secondary | ICD-10-CM | POA: Diagnosis not present

## 2017-09-07 DIAGNOSIS — Z90710 Acquired absence of both cervix and uterus: Secondary | ICD-10-CM | POA: Diagnosis not present

## 2017-09-07 DIAGNOSIS — R001 Bradycardia, unspecified: Secondary | ICD-10-CM | POA: Diagnosis not present

## 2017-09-07 DIAGNOSIS — R5383 Other fatigue: Secondary | ICD-10-CM | POA: Diagnosis not present

## 2017-09-07 DIAGNOSIS — Z7984 Long term (current) use of oral hypoglycemic drugs: Secondary | ICD-10-CM | POA: Diagnosis not present

## 2017-09-07 DIAGNOSIS — Z7901 Long term (current) use of anticoagulants: Secondary | ICD-10-CM | POA: Insufficient documentation

## 2017-09-07 DIAGNOSIS — J309 Allergic rhinitis, unspecified: Secondary | ICD-10-CM | POA: Diagnosis not present

## 2017-09-07 DIAGNOSIS — Z803 Family history of malignant neoplasm of breast: Secondary | ICD-10-CM | POA: Diagnosis not present

## 2017-09-07 DIAGNOSIS — Z8679 Personal history of other diseases of the circulatory system: Secondary | ICD-10-CM | POA: Insufficient documentation

## 2017-09-07 DIAGNOSIS — E1122 Type 2 diabetes mellitus with diabetic chronic kidney disease: Secondary | ICD-10-CM | POA: Diagnosis not present

## 2017-09-07 DIAGNOSIS — Z86711 Personal history of pulmonary embolism: Secondary | ICD-10-CM | POA: Diagnosis not present

## 2017-09-07 DIAGNOSIS — I444 Left anterior fascicular block: Secondary | ICD-10-CM | POA: Diagnosis not present

## 2017-09-07 DIAGNOSIS — Z809 Family history of malignant neoplasm, unspecified: Secondary | ICD-10-CM | POA: Insufficient documentation

## 2017-09-07 DIAGNOSIS — Z87891 Personal history of nicotine dependence: Secondary | ICD-10-CM | POA: Insufficient documentation

## 2017-09-07 DIAGNOSIS — I129 Hypertensive chronic kidney disease with stage 1 through stage 4 chronic kidney disease, or unspecified chronic kidney disease: Secondary | ICD-10-CM | POA: Insufficient documentation

## 2017-09-07 DIAGNOSIS — Z8249 Family history of ischemic heart disease and other diseases of the circulatory system: Secondary | ICD-10-CM | POA: Diagnosis not present

## 2017-09-07 DIAGNOSIS — R079 Chest pain, unspecified: Secondary | ICD-10-CM | POA: Diagnosis present

## 2017-09-07 DIAGNOSIS — I421 Obstructive hypertrophic cardiomyopathy: Secondary | ICD-10-CM | POA: Insufficient documentation

## 2017-09-07 DIAGNOSIS — R0789 Other chest pain: Secondary | ICD-10-CM | POA: Diagnosis not present

## 2017-09-07 DIAGNOSIS — I480 Paroxysmal atrial fibrillation: Secondary | ICD-10-CM | POA: Insufficient documentation

## 2017-09-07 LAB — BASIC METABOLIC PANEL
Anion gap: 7 (ref 5–15)
BUN: 15 mg/dL (ref 6–20)
CO2: 27 mmol/L (ref 22–32)
CREATININE: 1.39 mg/dL — AB (ref 0.44–1.00)
Calcium: 9.3 mg/dL (ref 8.9–10.3)
Chloride: 105 mmol/L (ref 101–111)
GFR calc Af Amer: 41 mL/min — ABNORMAL LOW (ref >60)
GFR, EST NON AFRICAN AMERICAN: 35 mL/min — AB (ref >60)
Glucose, Bld: 266 mg/dL — ABNORMAL HIGH (ref 65–99)
Potassium: 3.8 mmol/L (ref 3.5–5.1)
SODIUM: 139 mmol/L (ref 135–145)

## 2017-09-07 LAB — TROPONIN I
Troponin I: <0.03 ng/mL (ref <0.03)
Troponin I: <0.03 ng/mL (ref <0.03)
Troponin I: <0.03 ng/mL (ref <0.03)

## 2017-09-07 LAB — CBC
HCT: 41.3 % (ref 36.0–46.0)
Hemoglobin: 13.8 g/dL (ref 12.0–15.0)
MCH: 30.9 pg (ref 26.0–34.0)
MCHC: 33.4 g/dL (ref 30.0–36.0)
MCV: 92.6 fL (ref 78.0–100.0)
PLATELETS: 195 10*3/uL (ref 150–400)
RBC: 4.46 MIL/uL (ref 3.87–5.11)
RDW: 13.6 % (ref 11.5–15.5)
WBC: 8 10*3/uL (ref 4.0–10.5)

## 2017-09-07 LAB — BRAIN NATRIURETIC PEPTIDE: B NATRIURETIC PEPTIDE 5: 122.5 pg/mL — AB (ref 0.0–100.0)

## 2017-09-07 LAB — I-STAT TROPONIN, ED: Troponin i, poc: 0 ng/mL (ref 0.00–0.08)

## 2017-09-07 MED ORDER — ASPIRIN 81 MG PO CHEW
324.0000 mg | CHEWABLE_TABLET | Freq: Once | ORAL | Status: AC
  Administered 2017-09-07: 324 mg via ORAL
  Filled 2017-09-07: qty 4

## 2017-09-07 MED ORDER — SIMVASTATIN 40 MG PO TABS: 40.0000 mg | ORAL_TABLET | Freq: Every day | ORAL | Status: DC

## 2017-09-07 MED ORDER — MONTELUKAST SODIUM 10 MG PO TABS
10.0000 mg | ORAL_TABLET | Freq: Every day | ORAL | Status: DC
  Administered 2017-09-07 – 2017-09-08 (×2): 10 mg via ORAL
  Filled 2017-09-07 (×2): qty 1

## 2017-09-07 MED ORDER — ACETAMINOPHEN 325 MG PO TABS: 650.0000 mg | ORAL_TABLET | ORAL | Status: DC | PRN

## 2017-09-07 MED ORDER — DISOPYRAMIDE PHOSPHATE 150 MG PO CAPS
150.0000 mg | ORAL_CAPSULE | Freq: Two times a day (BID) | ORAL | Status: DC
  Administered 2017-09-07 – 2017-09-09 (×4): 150 mg via ORAL
  Filled 2017-09-07 (×5): qty 1

## 2017-09-07 MED ORDER — PANTOPRAZOLE SODIUM 40 MG PO TBEC
40.0000 mg | DELAYED_RELEASE_TABLET | Freq: Every day | ORAL | Status: DC
  Administered 2017-09-08 – 2017-09-09 (×2): 40 mg via ORAL
  Filled 2017-09-07 (×2): qty 1

## 2017-09-07 MED ORDER — NITROGLYCERIN 0.4 MG SL SUBL: 0.4000 mg | SUBLINGUAL_TABLET | SUBLINGUAL | Status: DC | PRN

## 2017-09-07 MED ORDER — MIRTAZAPINE 30 MG PO TABS
15.0000 mg | ORAL_TABLET | Freq: Every day | ORAL | Status: DC
  Filled 2017-09-07: qty 0.5

## 2017-09-07 MED ORDER — RIVAROXABAN 15 MG PO TABS
15.0000 mg | ORAL_TABLET | Freq: Every day | ORAL | Status: DC
  Administered 2017-09-07 – 2017-09-08 (×2): 15 mg via ORAL
  Filled 2017-09-07 (×2): qty 1

## 2017-09-07 MED ORDER — ONDANSETRON HCL 4 MG/2ML IJ SOLN: 4.0000 mg | Freq: Four times a day (QID) | INTRAMUSCULAR | Status: DC | PRN

## 2017-09-07 MED ORDER — NITROGLYCERIN 0.4 MG SL SUBL
0.4000 mg | SUBLINGUAL_TABLET | SUBLINGUAL | Status: DC | PRN
  Administered 2017-09-07 (×2): 0.4 mg via SUBLINGUAL
  Filled 2017-09-07: qty 1

## 2017-09-07 MED ORDER — ATORVASTATIN CALCIUM 20 MG PO TABS
20.0000 mg | ORAL_TABLET | Freq: Every day | ORAL | Status: DC
  Administered 2017-09-08: 20 mg via ORAL
  Filled 2017-09-07: qty 1

## 2017-09-07 MED ORDER — SERTRALINE HCL 50 MG PO TABS
50.0000 mg | ORAL_TABLET | Freq: Every day | ORAL | Status: DC
  Administered 2017-09-08 – 2017-09-09 (×2): 50 mg via ORAL
  Filled 2017-09-07 (×2): qty 1

## 2017-09-07 MED ORDER — AMLODIPINE BESYLATE 5 MG PO TABS
5.0000 mg | ORAL_TABLET | Freq: Two times a day (BID) | ORAL | Status: DC
  Administered 2017-09-07 – 2017-09-09 (×4): 5 mg via ORAL
  Filled 2017-09-07 (×4): qty 1

## 2017-09-07 MED ORDER — MIRTAZAPINE 15 MG PO TABS
15.0000 mg | ORAL_TABLET | Freq: Every day | ORAL | Status: DC
  Administered 2017-09-07 – 2017-09-08 (×2): 15 mg via ORAL
  Filled 2017-09-07 (×2): qty 1

## 2017-09-07 MED ORDER — METOPROLOL TARTRATE 25 MG PO TABS
25.0000 mg | ORAL_TABLET | Freq: Two times a day (BID) | ORAL | Status: DC
  Administered 2017-09-07 – 2017-09-09 (×4): 25 mg via ORAL
  Filled 2017-09-07 (×4): qty 1

## 2017-09-07 NOTE — Plan of Care (Signed)
  Clinical Measurements: Diagnostic test results will improve 09/07/2017 2157 - Progressing by Ashley Murrain, RN   Pain Managment: General experience of comfort will improve 09/07/2017 2157 - Progressing by Ashley Murrain, RN

## 2017-09-07 NOTE — Telephone Encounter (Signed)
   Reason for Disposition . Chest pain lasts > 5 minutes (Exceptions: chest pain occurring > 3 days ago and now asymptomatic; same as previously diagnosed heartburn and has accompanying sour taste in mouth)  Answer Assessment - Initial Assessment Questions 1. LOCATION: "Where does it hurt?"       No pain this morning 2. RADIATION: "Does the pain go anywhere else?" (e.g., into neck, jaw, arms, back)     Hurt between her breast 3. ONSET: "When did the chest pain begin?" (Minutes, hours or days)      Lasted all last night - but was able to sleep 4. PATTERN "Does the pain come and go, or has it been constant since it started?"  "Does it get worse with exertion?"      No pain 5. DURATION: "How long does it last" (e.g., seconds, minutes, hours)     Several hours 6. SEVERITY: "How bad is the pain?"  (e.g., Scale 1-10; mild, moderate, or severe)    - MILD (1-3): doesn't interfere with normal activities     - MODERATE (4-7): interferes with normal activities or awakens from sleep    - SEVERE (8-10): excruciating pain, unable to do any normal activities       8 7. CARDIAC RISK FACTORS: "Do you have any history of heart problems or risk factors for heart disease?" (e.g., prior heart attack, angina; high blood pressure, diabetes, being overweight, high cholesterol, smoking, or strong family history of heart disease)     No - has a heart mumur 8. PULMONARY RISK FACTORS: "Do you have any history of lung disease?"  (e.g., blood clots in lung, asthma, emphysema, birth control pills)     Blood clots in my lung 2015 9. CAUSE: "What do you think is causing the chest pain?"     Unsure 10. OTHER SYMPTOMS: "Do you have any other symptoms?" (e.g., dizziness, nausea, vomiting, sweating, fever, difficulty breathing, cough)       No 11. PREGNANCY: "Is there any chance you are pregnant?" "When was your last menstrual period?"       No  Protocols used: CHEST PAIN-A-AH  Pt. Reports she is not having pain now,  but because of the length and intensity of her pain last night, recommend she go to ED. States her son will take her now.

## 2017-09-07 NOTE — Telephone Encounter (Signed)
Patient went to ED

## 2017-09-07 NOTE — Consult Note (Signed)
Cardiology Consultation:   Patient ID: Monique [name]; [id number]; [birth year]   Admit date: 09/07/2017 Date of Consult: 09/07/2017  Primary Care Provider: Hoyt Koch, MD Primary Cardiologist: Dr. Harrington Challenger Primary Electrophysiologist: NA  Patient Profile:   Monique [name] is a 82 y.o. female with a hx of HOCM (initial dx 2009) last evaluated 11/2014 per echo which revealed LVEF 65-70% with dynamic obstruction and severe LVH and Grade I diastolic dysfunction, PAF on Xarelto, bilateral PE (11/2014 while on therapeutic coumadin), DVT(06/2014), CKD stage III, HTN  and HLD who is being seen today for the evaluation of chest pain at the request of Dr. Ellender Hose.   She had a previous low risk myoview stress test 11/2014. There is an echo pending this admission.   History of Present Illness:   Monique [name] is a pleasant 82 yo female with a complicated history of HOCM with dynamic obstruction and an EF of 65-70% per echocardiogram in 2016. She has hx of Afib on Xarelto. In 2015, she was found to have a LE DVT and was subsequently placed on Coumadin therapy.  In 11/2014, she was therapeutic with Coumadin (INR 3.21) and was found to have a PE. She was then changed from Coumadin to Xarelto given her hx of DVT, PE and Afib. It was recommended that she follow with Heme, although I am unsure if this happened per chart review. She has been compliant with her Xarelto since this time.   Monique [name] presents to the ED with substernal, dull, aching chest pain that began yesterday evening while watching television on her couch and lasted throughout the night. It was  intermittent in duration, lasting a few minutes at a time, then subsiding. In talking with her, the chest pain actually began closer to 1 week ago, however was not as intense as it was last evening. She rates her pain last night as a 7-8/10 in severity and admits that she has never had this type of pain in the past. She states that she did not take any  medications to help, but the pain did lessen with walking. She has not had any chest pain since waking this morning. She denies any associated symptoms such as N/V, dizziness, syncope, numbness, tingling in the hands, jaw or back pain or increased swelling in her LE. She is currently pain free.   In the ED, initial troponin level was normal negative, 0.00. A stat repeat was placed for comparison. Her CXR was unremarkable with mild chronic bronchitic changes, and stable cardiomegaly without pulmonary edema. BNP was normal, 122. Creatinine was slightly elevated at 1.39. EKG NSR with t-wave inversion in leads I, aVL as last seen on previous EKG tracings from 11/11/2016 office visit.    Past Medical History:  Diagnosis Date  . Abdominal pain 11/18/2015  . Acute respiratory failure with hypoxia ([place]) 06/27/2014  . Breast pain, left 10/11/2016  . Cerebral aneurysm 2000 06/27/2014  . Chest pain 10/30/2014  . CHF (congestive heart failure) ([place])   . Chronic anticoagulation 06/2014   Coumadin started after bilateral PE  . Chronic kidney disease, stage III (moderate) ([place]) 11/21/2014  . Clotting disorder ([place])    right was removed, still have left cataract  . Dizziness and giddiness 11/28/2014  . DVT of lower extremity (deep venous thrombosis) ([place]) 11/28/2014  . Dysuria 07/07/2014  . Elevated troponin 11/28/2014  . Fatty liver   . Gastritis 05/16/2012   Chronic dyspepsia on PPI daily.   Marland Kitchen Headache, acute  06/08/2010   Qualifier: Diagnosis of  By: Linda Hedges MD, Heinz Knuckles   . Heart murmur   . Hematochezia 11/18/2015  . History of fibromuscular dysplasia 06/27/2014  . HTN (hypertension)    Essential  . Hyperlipidemia   . Hypertrophic obstructive cardiomyopathy (HOCM) ([place]) 05/20/2008   Qualifier: Diagnosis of  By: Linda Hedges MD, Heinz Knuckles   . Kidney stones   . LVH (left ventricular hypertrophy)   . Orthostatic hypotension 11/28/2014  . PAF (paroxysmal atrial fibrillation) ([place]) 2000  . Palpitations 11/28/2014  .  PAROXYSMAL ATRIAL FIBRILLATION 05/20/2008   Qualifier: Diagnosis of  By: Linda Hedges MD, Heinz Knuckles   . PE (pulmonary embolism) 06/2014   Bilateral  . Pulmonary embolism, bilateral ([place]) 06/27/2014  . SOB (shortness of breath) 06/27/2014  . Subarachnoid hemorrhage ([place]) 2000  . UTI (urinary tract infection) 01/04/2015    Past Surgical History:  Procedure Laterality Date  . ABDOMINAL HYSTERECTOMY  2000  . CARDIAC CATHETERIZATION  05/2008   Nl cors, no gradient, EF 60%     Prior to Admission medications   Medication Sig Start Date End Date Taking? Authorizing Provider  acetaminophen (TYLENOL) 500 MG tablet Take 1,000 mg by mouth.   Yes [provider]  amLODipine (NORVASC) 5 MG tablet Take 1 tablet (5 mg total) by mouth 2 (two) times daily. 12/02/16  Yes Fay Records, MD  disopyramide (NORPACE) 150 MG capsule Take 1 capsule (150 mg total) by mouth 2 (two) times daily. 01/13/17  Yes Fay Records, MD  metoprolol tartrate (LOPRESSOR) 25 MG tablet Take 1 tablet (25 mg total) by mouth 2 (two) times daily. 12/06/16  Yes Fay Records, MD  mirtazapine (REMERON) 15 MG tablet TAKE 1 TABLET (15 MG TOTAL) BY MOUTH AT BEDTIME. 06/20/17  Yes Hoyt Koch, MD  montelukast (SINGULAIR) 10 MG tablet Take 1 tablet (10 mg total) by mouth at bedtime. 06/20/17  Yes Hoyt Koch, MD  nitroGLYCERIN (NITROSTAT) 0.4 MG SL tablet Place 1 tablet (0.4 mg total) under the tongue every 5 (five) minutes as needed for chest pain. 03/30/15  Yes Fay Records, MD  pantoprazole (PROTONIX) 40 MG tablet TAKE 1 TABLET BY MOUTH EVERY DAY 08/08/17  Yes Hoyt Koch, MD  Rivaroxaban (XARELTO) 15 MG TABS tablet Take 1 tablet (15 mg total) by mouth daily with supper. 01/17/17  Yes Fay Records, MD  sertraline (ZOLOFT) 50 MG tablet TAKE 1 TABLET (50 MG TOTAL) BY MOUTH DAILY. 02/24/15  Yes Hoyt Koch, MD  simvastatin (ZOCOR) 40 MG tablet TAKE 1 TABLET (40 MG TOTAL) BY MOUTH AT BEDTIME. 01/13/17  Yes  Fay Records, MD    Inpatient Medications: Scheduled Meds:  Continuous Infusions:  PRN Meds: nitroGLYCERIN  Allergies:   No Known Allergies  Social History:   Social History   Socioeconomic History  . Marital status: Divorced    Spouse name: Not on file  . Number of children: 3  . Years of education: 30  . Highest education level: Not on file  Social Needs  . Financial resource strain: Not on file  . Food insecurity - worry: Not on file  . Food insecurity - inability: Not on file  . Transportation needs - medical: Not on file  . Transportation needs - non-medical: Not on file  Occupational History  . Occupation: Retired [employer]  . Smoking status: Former Smoker    Last attempt to quit: 09/06/1967    Years since quitting:  50.0  . Smokeless tobacco: Never Used  Substance and Sexual Activity  . Alcohol use: No    Alcohol/week: 0.0 oz  . Drug use: No  . Sexual activity: Not Currently  Other Topics Concern  . Not on file  Social History Narrative   HSG. Married '61 - '88/divorced. 3 sons - '74, '59, '62, 1 daughter '63; 5 grandchildren. Lives alone in two story home. She does not drive.   Retired from [employer].    Family History:   Family History  Problem Relation Age of Onset  . Breast cancer Mother        Deceased  . Stroke Mother   . Arrhythmia Mother   . Heart disease Mother   . Arrhythmia Sister   . Breast cancer Daughter   . Colon cancer Neg Hx   . Esophageal cancer Neg Hx   . Pancreatic cancer Neg Hx   . Rectal cancer Neg Hx   . Stomach cancer Neg Hx    Family Status:  Family Status  Relation Name Status  . Mother  Deceased at age 36       CVA, Breast Cancer, hx irreg HR  . Father  Deceased at age 37       No hx CAD  . Sister  Alive       hx irreg HR  . Sister  Alive       hx irreg HR  . MGM  Deceased  . MGF  Deceased  . PGM  Deceased  . PGF  Deceased  . Sister  (Not Specified)  . Daughter  (Not  Specified)  . Neg Hx  (Not Specified)    ROS:  Please see the history of present illness.  All other ROS reviewed and negative.     Physical Exam/Data:   Vitals:   09/07/17 1200 09/07/17 1230 09/07/17 1300 09/07/17 1421  BP: (!) 134/98 (!) 122/52 120/68 119/70  Pulse: 73 (!) 55 (!) 53 70  Resp:  16 14 20   Temp:    98.6 F (37 C)  TempSrc:    Oral  SpO2: 96% 96% 95% 99%  Weight:      Height:       No intake or output data in the 24 hours ending 09/07/17 1549 Filed Weights   09/07/17 1101  Weight: 165 lb (74.8 kg)   Body mass index is 26.63 kg/m.   General: Well developed, well nourished, NAD Skin: Warm, dry, intact  Head: Normocephalic, atraumatic, clear, moist mucus membranes. Neck: Negative for carotid bruits. No JVD Lungs:Clear to ausculation bilaterally. No wheezes, rales, or rhonchi. Breathing is unlabored. Cardiovascular: RRR with S1 S2.  Soft systolic murmur rubs, or gallops Abdomen: Soft, non-tender, non-distended with normoactive bowel sounds. No obvious abdominal masses. MSK: Strength and tone appear normal for age. 5/5 in all extremities Extremities: No edema. No clubbing or cyanosis. DP/PT pulses 2+ bilaterally Neuro: Alert and oriented. No focal deficits. No facial asymmetry. MAE spontaneously. Psych: Responds to questions appropriately with normal affect.     EKG:  The EKG was personally reviewed and demonstrates: 09/07/17 NSR Telemetry:  Telemetry was personally reviewed and demonstrates: 09/07/17 NSR  Relevant CV Studies:  ECHO: 11/24/14 Study Conclusions  - Left ventricle: The cavity size was normal. Wall thickness was increased in a pattern of severe LVH. Systolic function was vigorous. The estimated ejection fraction was in the range of 65% to 70%. There was dynamic obstruction. Wall motion was normal; there were  no regional wall motion abnormalities. Doppler parameters are consistent with abnormal left ventricular relaxation  (grade 1 diastolic dysfunction). - Aortic valve: Valve area (VTI): 2.5 cm^2. Valve area (Vmax): 2.62 cm^2. - Mitral valve: There was mild regurgitation.  Transthoracic echocardiography. M-mode, complete 2D, spectral Doppler, and color Doppler. Birthdate: Patient birthdate: [birth year]. Age: Patient is 82 yr old. Sex: Gender: female. BMI: 24 kg/m^2. Blood pressure:   140/6 Patient status: Inpatient. Study date: Study date: 11/24/2014. Study time: 12:59 PM. Location: Bedside.  CATH: NA  Myoview stress 11/2014: Normal   Laboratory Data:  Chemistry Recent Labs  Lab 09/07/17 1107  NA 139  K 3.8  CL 105  CO2 27  GLUCOSE 266*  BUN 15  CREATININE 1.39*  CALCIUM 9.3  GFRNONAA 35*  GFRAA 41*  ANIONGAP 7    Total Protein  Date Value Ref Range Status  11/18/2015 7.5 6.0 - 8.3 g/dL Final   Albumin  Date Value Ref Range Status  11/18/2015 4.1 3.5 - 5.2 g/dL Final   AST  Date Value Ref Range Status  11/18/2015 17 0 - 37 U/L Final   ALT  Date Value Ref Range Status  11/18/2015 8 0 - 35 U/L Final   Alkaline Phosphatase  Date Value Ref Range Status  11/18/2015 95 39 - 117 U/L Final   Total Bilirubin  Date Value Ref Range Status  11/18/2015 0.6 0.2 - 1.2 mg/dL Final   Hematology Recent Labs  Lab 09/07/17 1107  WBC 8.0  RBC 4.46  HGB 13.8  HCT 41.3  MCV 92.6  MCH 30.9  MCHC 33.4  RDW 13.6  PLT 195   Cardiac EnzymesNo results for input(s): TROPONINI in the last 168 hours.  Recent Labs  Lab 09/07/17 1133  TROPIPOC 0.00    BNP Recent Labs  Lab 09/07/17 1107  BNP 122.5*    DDimer No results for input(s): DDIMER in the last 168 hours. TSH:  Lab Results  Component Value Date   TSH 0.91 11/20/2014   Lipids: Lab Results  Component Value Date   CHOL 140 11/11/2016   HDL 51 11/11/2016   LDLCALC 65 11/11/2016   TRIG 118 11/11/2016   CHOLHDL 2.7 11/11/2016   HgbA1c:No results found for: HGBA1C  Radiology/Studies:  Dg Chest 2  View  Result Date: 09/07/2017 CLINICAL DATA:  Chest pain last night for 2 hours that resolve. No associated symptoms. History of hypertension, obstructive cardiomyopathy, Panorex is mole atrial fibrillation. EXAM: CHEST  2 VIEW COMPARISON:  Chest x-ray of November 28, 2014 FINDINGS: The lungs are adequately inflated. There is no focal infiltrate. There is linear increased density in the retrosternal region on the lateral view which is new and likely lies on the left in the lingula. The heart is top-normal in size. The pulmonary vascularity is normal. There is tortuosity of the descending thoracic aorta. There is calcification in the wall of the aortic arch. There is mild multilevel degenerative disc disease. IMPRESSION: Mild chronic bronchitic changes, stable. Probable lingular subsegmental atelectasis. Stable cardiomegaly without pulmonary edema. Thoracic aortic atherosclerosis. Electronically Signed   By: David  Martinique M.D.   On: 09/07/2017 11:56    Assessment and Plan:   1.Chest pain: -Troponin level negative, pending second lab draw -EKG unremarkable with old T-wave inversion in leads I, aVL consistent with previous tracings -Will order Lexiscan Myoview stress for tomorrow, 09/08/17 -CXR unremarkable -D-dimer pending given prior hx of PE and DVT, not currently hypoxic or tachycardic -Remains on Xarelto and is compliant -Chest  pain free with no associated symptoms  2. CKD Stage III: -Stable -Cr 1.39, her baseline appears to be in the 1.4-1.5 range -Avoid nephrotoxic agents  3. Hx HOCM: -Plan repeat echo to assess gradient and thickness, last evaluation 2016. See report above.  -Consider changing Amlodipine to another agent if ischemic workup is unremarkable  4. Paroxysmal Afib: -Maintaining NSR with rate control -Continue Xarelto  For questions or updates, please contact [facility] Please consult [email or web address] for contact info under Cardiology/STEMI.   Signed, Kathyrn Drown, NP   09/07/2017 3:49 PM   Attending Note:   The patient was seen and examined.  Agree with assessment and plan as noted above.  Changes made to the above note as needed.  Patient seen and independently examined with Kathyrn Drown, NP .   We discussed all aspects of the encounter. I agree with the assessment and plan as stated above.  1.  Chest discomfort: The patient's chest discomfort is fairly atypical.  She has some T wave inversions in the inferior leads but the EKG is unchanged from previous EKGs.  Troponin levels are negative. We will schedule her for a [place] study for tomorrow. Repeat echocardiogram to assess her LVOT dynamic outflow tract gradient.  2.  Paroxysmal atrial for ablation: She is maintaining sinus rhythm.   I have spent a total of 40 minutes with patient reviewing hospital  notes , telemetry, EKGs, labs and examining patient as well as establishing an assessment and plan that was discussed with the patient. > 50% of time was spent in direct patient care.    Thayer Headings, Brooke Bonito., MD, [facility] 09/07/2017, 7:03 PM [address]  [place] Pager [phone]

## 2017-09-07 NOTE — ED Triage Notes (Signed)
Patient c/o central chest pains last night that lasted couple hours that have resolved but was told best to go get checked out. Patient denies cough or n/v.

## 2017-09-07 NOTE — H&P (Signed)
History and Physical    Baudelia Schroepfer YJE:[id number] DOB: 10-Jun-1936 DOA: 09/07/2017  Referring MD/NP/PA: PA Isaacs  PCP: Hoyt Koch, MD   Patient coming from: home   Chief Complaint: chest pain   HPI: Monique [name] is a 82 y.o. female with known hx of DVT/PE on Rivaroxaban, obstructive cardiomyopathy, HTN, presented to [facility] ED with main concern of 24 hours duration of intermittent and dull type of chest pressure, mostly substernal and typically lasting minutes to hours, non radiating, no specific aggravating factors, occasionally but not consistently alleviated with sitting upright. Patient denies similar episodes in the past. Pt also reports occasional associated mild exertional dyspnea. Pt denies fevers, chills, no recent sick contacts or long distance traveling. Patient reports compliance with medications. Patient denies any abd or urinary concerns.   ED Course: In ED, pt is hemodynamically stable, VS reviewed and stable, blood work notable for Cr 1.39 (of note Cr in oct 2018 was 1.4). First troponin negative. TRH asked to admit for further evaluation. ED PA spoke with cardiologist on call, recommendation was to obtain ECHO and call cardiology in AM for consideration of further evaluation.   Review of Systems:  Constitutional: Negative for fever, chills, diaphoresis, activity change, appetite change and fatigue.  HENT: Negative for ear pain, nosebleeds, congestion, facial swelling, rhinorrhea, neck pain, neck stiffness and ear discharge.   Eyes: Negative for pain, discharge, redness, itching and visual disturbance.  Respiratory: Negative for cough, choking, chest tightness, wheezing and stridor.   Cardiovascular: Negative for palpitations and leg swelling.  Gastrointestinal: Negative for abdominal distention.  Genitourinary: Negative for dysuria, urgency, frequency, hematuria, flank pain, decreased urine volume, difficulty urinating and dyspareunia.  Musculoskeletal:  Negative for back pain, joint swelling, arthralgias and gait problem.  Neurological: Negative for dizziness, tremors, seizures, syncope, facial asymmetry, speech difficulty, weakness, light-headedness, numbness and headaches.  Hematological: Negative for adenopathy. Does not bruise/bleed easily.  Psychiatric/Behavioral: Negative for hallucinations, behavioral problems, confusion, dysphoric mood, decreased concentration and agitation.   Past Medical History:  Diagnosis Date  . Abdominal pain 11/18/2015  . Acute respiratory failure with hypoxia ([place]) 06/27/2014  . Breast pain, left 10/11/2016  . Cerebral aneurysm 2000 06/27/2014  . Chest pain 10/30/2014  . CHF (congestive heart failure) ([place])   . Chronic anticoagulation 06/2014   Coumadin started after bilateral PE  . Chronic kidney disease, stage III (moderate) ([place]) 11/21/2014  . Clotting disorder ([place])    right was removed, still have left cataract  . Dizziness and giddiness 11/28/2014  . DVT of lower extremity (deep venous thrombosis) ([place]) 11/28/2014  . Dysuria 07/07/2014  . Elevated troponin 11/28/2014  . Fatty liver   . Gastritis 05/16/2012   Chronic dyspepsia on PPI daily.   Marland Kitchen Headache, acute 06/08/2010   Qualifier: Diagnosis of  By: Linda Hedges MD, Heinz Knuckles   . Heart murmur   . Hematochezia 11/18/2015  . History of fibromuscular dysplasia 06/27/2014  . HTN (hypertension)    Essential  . Hyperlipidemia   . Hypertrophic obstructive cardiomyopathy (HOCM) ([place]) 05/20/2008   Qualifier: Diagnosis of  By: Linda Hedges MD, Heinz Knuckles   . Kidney stones   . LVH (left ventricular hypertrophy)   . Orthostatic hypotension 11/28/2014  . PAF (paroxysmal atrial fibrillation) ([place]) 2000  . Palpitations 11/28/2014  . PAROXYSMAL ATRIAL FIBRILLATION 05/20/2008   Qualifier: Diagnosis of  By: Linda Hedges MD, Heinz Knuckles   . PE (pulmonary embolism) 06/2014   Bilateral  . Pulmonary embolism, bilateral ([place]) 06/27/2014  .  SOB (shortness of breath) 06/27/2014  .  Subarachnoid hemorrhage ([place]) 2000  . UTI (urinary tract infection) 01/04/2015    Past Surgical History:  Procedure Laterality Date  . ABDOMINAL HYSTERECTOMY  2000  . CARDIAC CATHETERIZATION  05/2008   Nl cors, no gradient, EF 60%   Social Hx:  reports that she quit smoking about 50 years ago. she has never used smokeless tobacco. She reports that she does not drink alcohol or use drugs.  No Known Allergies  Family History  Problem Relation Age of Onset  . Breast cancer Mother        Deceased  . Stroke Mother   . Arrhythmia Mother   . Heart disease Mother   . Arrhythmia Sister   . Breast cancer Daughter   . Colon cancer Neg Hx   . Esophageal cancer Neg Hx   . Pancreatic cancer Neg Hx   . Rectal cancer Neg Hx   . Stomach cancer Neg Hx     Prior to Admission medications   Medication Sig Start Date End Date Taking? Authorizing Provider  acetaminophen (TYLENOL) 500 MG tablet Take 1,000 mg by mouth.   Yes [provider]  amLODipine (NORVASC) 5 MG tablet Take 1 tablet (5 mg total) by mouth 2 (two) times daily. 12/02/16  Yes Fay Records, MD  disopyramide (NORPACE) 150 MG capsule Take 1 capsule (150 mg total) by mouth 2 (two) times daily. 01/13/17  Yes Fay Records, MD  metoprolol tartrate (LOPRESSOR) 25 MG tablet Take 1 tablet (25 mg total) by mouth 2 (two) times daily. 12/06/16  Yes Fay Records, MD  mirtazapine (REMERON) 15 MG tablet TAKE 1 TABLET (15 MG TOTAL) BY MOUTH AT BEDTIME. 06/20/17  Yes Hoyt Koch, MD  montelukast (SINGULAIR) 10 MG tablet Take 1 tablet (10 mg total) by mouth at bedtime. 06/20/17  Yes Hoyt Koch, MD  nitroGLYCERIN (NITROSTAT) 0.4 MG SL tablet Place 1 tablet (0.4 mg total) under the tongue every 5 (five) minutes as needed for chest pain. 03/30/15  Yes Fay Records, MD  pantoprazole (PROTONIX) 40 MG tablet TAKE 1 TABLET BY MOUTH EVERY DAY 08/08/17  Yes Hoyt Koch, MD  Rivaroxaban (XARELTO) 15 MG TABS tablet Take 1  tablet (15 mg total) by mouth daily with supper. 01/17/17  Yes Fay Records, MD  sertraline (ZOLOFT) 50 MG tablet TAKE 1 TABLET (50 MG TOTAL) BY MOUTH DAILY. 02/24/15  Yes Hoyt Koch, MD  simvastatin (ZOCOR) 40 MG tablet TAKE 1 TABLET (40 MG TOTAL) BY MOUTH AT BEDTIME. 01/13/17  Yes Fay Records, MD    Physical Exam: Vitals:   09/07/17 1200 09/07/17 1230 09/07/17 1300 09/07/17 1421  BP: (!) 134/98 (!) 122/52 120/68 119/70  Pulse: 73 (!) 55 (!) 53 70  Resp:  16 14 20   Temp:    98.6 F (37 C)  TempSrc:    Oral  SpO2: 96% 96% 95% 99%  Weight:      Height:        Constitutional: NAD, calm, comfortable Vitals:   09/07/17 1200 09/07/17 1230 09/07/17 1300 09/07/17 1421  BP: (!) 134/98 (!) 122/52 120/68 119/70  Pulse: 73 (!) 55 (!) 53 70  Resp:  16 14 20   Temp:    98.6 F (37 C)  TempSrc:    Oral  SpO2: 96% 96% 95% 99%  Weight:      Height:       Eyes: PERRL, lids and conjunctivae normal  ENMT: Mucous membranes are moist. Posterior pharynx clear of any exudate or lesions.Normal dentition.  Neck: normal, supple, no masses, no thyromegaly Respiratory: clear to auscultation bilaterally, no wheezing, no crackles. Normal respiratory effort. No accessory muscle use.  Cardiovascular: Regular rate and rhythm, no rubs / gallops. No extremity edema. 2+ pedal pulses. No carotid bruits.  Abdomen: no tenderness, no masses palpated. No hepatosplenomegaly. Bowel sounds positive.  Musculoskeletal: no clubbing / cyanosis. No joint deformity upper and lower extremities. Good ROM, no contractures. Normal muscle tone.  Skin: no rashes, lesions, ulcers. No induration Neurologic: CN 2-12 grossly intact. Sensation intact, DTR normal. Strength 5/5 in all 4.  Psychiatric: Normal judgment and insight. Alert and oriented x 3. Normal mood.   Labs on Admission: I have personally reviewed following labs and imaging studies  CBC: Recent Labs  Lab 09/07/17 1107  WBC 8.0  HGB 13.8  HCT 41.3  MCV  92.6  PLT 027   Basic Metabolic Panel: Recent Labs  Lab 09/07/17 1107  NA 139  K 3.8  CL 105  CO2 27  GLUCOSE 266*  BUN 15  CREATININE 1.39*  CALCIUM 9.3   BNP (last 3 results) Recent Labs    07/03/17 1112  PROBNP 183   Radiological Exams on Admission: Dg Chest 2 View Result Date: 09/07/2017 Mild chronic bronchitic changes, stable. Probable lingular subsegmental atelectasis. Stable cardiomegaly without pulmonary edema. Thoracic aortic atherosclerosis.  EKG: pending   Assessment/Plan Active Problems:   Chest pain - unclear etiology at this time - agree with admission to telemetry unit for observation - plan to cycle CE's - check TSH - obtain ECHO - provide analgesia as needed - consider cardiology consult in AM    HTN  - resume home regimen with Amlodipine and Metoprolol    HLD - continue Simvastatin per home medical regimen     DVT/PE - resume home regimen with Xarelto     Bradycardia  - noted HR in ED in 50's - currently HR in 70's, will need to monitor on tele - if HR drops < 50 further dose adjustment in Metoprolol may be needed     CKD stage III - review of records at least 6 months prior to this admission indicate baseline Cr 1.3 - 1.4 - Cr is currently at baseline   DVT prophylaxis: Xarelto  Code Status: Full  Family Communication: Pt updated at bedside Disposition Plan: Home once full work up completed  Consults called: None Admission status: Observation   Faye Ramsay MD [facility] Pager [phone]  If 7PM-7AM, please contact night-coverage [email or web address] Password [email or web address]  09/07/2017, 2:55 PM

## 2017-09-07 NOTE — ED Notes (Signed)
Patient reports some relief with 2nd nitroglycerin tablet however does not want 3rd dose.

## 2017-09-07 NOTE — ED Provider Notes (Signed)
[facility] DEPT Provider Note   CSN: [id number] Arrival date & time: 09/07/17  1050     History   Chief Complaint Chief Complaint  Patient presents with  . Chest Pain    HPI Monique [name] is a 82 y.o. female.  HPI  82 year old female with history of obstructive cardiomyopathy, history of DVT/PE, who presents with chest pain.  Patient states that for the last 24 hours, she has had an intermittent, increasingly severe dull, aching, substernal chest pressure.  She feels like it is a tightening sensation.  This is associated with mild shortness of breath.  She states that it hurts throughout the night.  Upon awakening this morning, she had both a sharp as well as dull pain.  The sharp pain has resolved with a dull pressure has persisted.  She has had no associated nausea or vomiting.  No diaphoresis.  She denies any current shortness of breath.  She is been taking her medications, including her Xarelto, as prescribed.  No other medical complaints.  Pain seemed to improve when she was sitting upright.  No specific worsening factors.  Past Medical History:  Diagnosis Date  . Abdominal pain 11/18/2015  . Acute respiratory failure with hypoxia ([place]) 06/27/2014  . Breast pain, left 10/11/2016  . Cerebral aneurysm 2000 06/27/2014  . Chest pain 10/30/2014  . CHF (congestive heart failure) ([place])   . Chronic anticoagulation 06/2014   Coumadin started after bilateral PE  . Chronic kidney disease, stage III (moderate) ([place]) 11/21/2014  . Clotting disorder ([place])    right was removed, still have left cataract  . Dizziness and giddiness 11/28/2014  . DVT of lower extremity (deep venous thrombosis) ([place]) 11/28/2014  . Dysuria 07/07/2014  . Elevated troponin 11/28/2014  . Fatty liver   . Gastritis 05/16/2012   Chronic dyspepsia on PPI daily.   Marland Kitchen Headache, acute 06/08/2010   Qualifier: Diagnosis of  By: Linda Hedges MD, Heinz Knuckles   . Heart murmur   . Hematochezia 11/18/2015    . History of fibromuscular dysplasia 06/27/2014  . HTN (hypertension)    Essential  . Hyperlipidemia   . Hypertrophic obstructive cardiomyopathy (HOCM) ([place]) 05/20/2008   Qualifier: Diagnosis of  By: Linda Hedges MD, Heinz Knuckles   . Kidney stones   . LVH (left ventricular hypertrophy)   . Orthostatic hypotension 11/28/2014  . PAF (paroxysmal atrial fibrillation) ([place]) 2000  . Palpitations 11/28/2014  . PAROXYSMAL ATRIAL FIBRILLATION 05/20/2008   Qualifier: Diagnosis of  By: Linda Hedges MD, Heinz Knuckles   . PE (pulmonary embolism) 06/2014   Bilateral  . Pulmonary embolism, bilateral ([place]) 06/27/2014  . SOB (shortness of breath) 06/27/2014  . Subarachnoid hemorrhage ([place]) 2000  . UTI (urinary tract infection) 01/04/2015    Patient Active Problem List   Diagnosis Date Noted  . Chest pain 09/07/2017  . Allergic rhinitis 06/20/2017  . Breast pain, left 10/11/2016  . HA (headache) 10/23/2015  . Benign essential HTN 10/23/2015  . DVT of lower extremity (deep venous thrombosis) ([place]) 11/28/2014  . Chronic kidney disease, stage III (moderate) ([place]) 11/21/2014  . Pulmonary embolism, bilateral ([place]) 06/27/2014  . History of fibromuscular dysplasia 06/27/2014  . Cerebral aneurysm 2000 06/27/2014  . Gastritis 05/16/2012  . Hyperlipidemia 05/20/2008  . Essential hypertension 05/20/2008  . Hypertrophic obstructive cardiomyopathy (HOCM) ([place]) 05/20/2008  . PAROXYSMAL ATRIAL FIBRILLATION 05/20/2008  . Subarachnoid hemorrhage 2000 05/20/2008    Past Surgical History:  Procedure Laterality Date  . ABDOMINAL HYSTERECTOMY  2000  .  CARDIAC CATHETERIZATION  05/2008   Nl cors, no gradient, EF 60%    OB History    No data available       Home Medications    Prior to Admission medications   Medication Sig Start Date End Date Taking? Authorizing Provider  acetaminophen (TYLENOL) 500 MG tablet Take 1,000 mg by mouth.   Yes [provider]  amLODipine (NORVASC) 5 MG tablet Take 1 tablet (5 mg  total) by mouth 2 (two) times daily. 12/02/16  Yes Fay Records, MD  disopyramide (NORPACE) 150 MG capsule Take 1 capsule (150 mg total) by mouth 2 (two) times daily. 01/13/17  Yes Fay Records, MD  metoprolol tartrate (LOPRESSOR) 25 MG tablet Take 1 tablet (25 mg total) by mouth 2 (two) times daily. 12/06/16  Yes Fay Records, MD  mirtazapine (REMERON) 15 MG tablet TAKE 1 TABLET (15 MG TOTAL) BY MOUTH AT BEDTIME. 06/20/17  Yes Hoyt Koch, MD  montelukast (SINGULAIR) 10 MG tablet Take 1 tablet (10 mg total) by mouth at bedtime. 06/20/17  Yes Hoyt Koch, MD  nitroGLYCERIN (NITROSTAT) 0.4 MG SL tablet Place 1 tablet (0.4 mg total) under the tongue every 5 (five) minutes as needed for chest pain. 03/30/15  Yes Fay Records, MD  pantoprazole (PROTONIX) 40 MG tablet TAKE 1 TABLET BY MOUTH EVERY DAY 08/08/17  Yes Hoyt Koch, MD  Rivaroxaban (XARELTO) 15 MG TABS tablet Take 1 tablet (15 mg total) by mouth daily with supper. 01/17/17  Yes Fay Records, MD  sertraline (ZOLOFT) 50 MG tablet TAKE 1 TABLET (50 MG TOTAL) BY MOUTH DAILY. 02/24/15  Yes Hoyt Koch, MD  simvastatin (ZOCOR) 40 MG tablet TAKE 1 TABLET (40 MG TOTAL) BY MOUTH AT BEDTIME. 01/13/17  Yes Fay Records, MD    Family History Family History  Problem Relation Age of Onset  . Breast cancer Mother        Deceased  . Stroke Mother   . Arrhythmia Mother   . Heart disease Mother   . Arrhythmia Sister   . Breast cancer Daughter   . Colon cancer Neg Hx   . Esophageal cancer Neg Hx   . Pancreatic cancer Neg Hx   . Rectal cancer Neg Hx   . Stomach cancer Neg Hx     Social History Social History   Tobacco Use  . Smoking status: Former Smoker    Last attempt to quit: 09/06/1967    Years since quitting: 50.0  . Smokeless tobacco: Never Used  Substance Use Topics  . Alcohol use: No    Alcohol/week: 0.0 oz  . Drug use: No     Allergies   Patient has no known allergies.   Review of  Systems Review of Systems  Constitutional: Positive for fatigue. Negative for chills and fever.  HENT: Negative for congestion and rhinorrhea.   Eyes: Negative for visual disturbance.  Respiratory: Positive for chest tightness. Negative for cough, shortness of breath and wheezing.   Cardiovascular: Positive for chest pain. Negative for leg swelling.  Gastrointestinal: Negative for abdominal pain, diarrhea, nausea and vomiting.  Genitourinary: Negative for dysuria and flank pain.  Musculoskeletal: Negative for neck pain and neck stiffness.  Skin: Negative for rash and wound.  Allergic/Immunologic: Negative for immunocompromised state.  Neurological: Negative for syncope, weakness and headaches.  All other systems reviewed and are negative.    Physical Exam Updated Vital Signs BP 120/68   Pulse (!) 53  Temp 98.3 F (36.8 C) (Oral)   Resp 14   Ht 5\' 6"  (1.676 m)   Wt 74.8 kg (165 lb)   SpO2 95%   BMI 26.63 kg/m   Physical Exam  Constitutional: She is oriented to person, place, and time. She appears well-developed and well-nourished. No distress.  HENT:  Head: Normocephalic and atraumatic.  Eyes: Conjunctivae are normal.  Neck: Neck supple.  Cardiovascular: Normal rate, regular rhythm and normal heart sounds. Exam reveals no friction rub.  No murmur heard. Pulmonary/Chest: Effort normal and breath sounds normal. No respiratory distress. She has no wheezes. She has no rales.  Abdominal: She exhibits no distension.  Musculoskeletal: She exhibits no edema.  Neurological: She is alert and oriented to person, place, and time. She exhibits normal muscle tone.  Skin: Skin is warm. Capillary refill takes less than 2 seconds.  Psychiatric: She has a normal mood and affect.  Nursing note and vitals reviewed.    ED Treatments / Results  Labs (all labs ordered are listed, but only abnormal results are displayed) Labs Reviewed  BASIC METABOLIC PANEL - Abnormal; Notable for the  following components:      Result Value   Glucose, Bld 266 (*)    Creatinine, Ser 1.39 (*)    GFR calc non Af Amer 35 (*)    GFR calc Af Amer 41 (*)    All other components within normal limits  BRAIN NATRIURETIC PEPTIDE - Abnormal; Notable for the following components:   B Natriuretic Peptide 122.5 (*)    All other components within normal limits  CBC  I-STAT TROPONIN, ED    EKG  EKG Interpretation  Date/Time:  Thursday September 07 2017 11:00:12 EST Ventricular Rate:  72 PR Interval:    QRS Duration: 90 QT Interval:  434 QTC Calculation: 475 R Axis:   -52 Text Interpretation:  Sinus rhythm Left anterior fascicular block Abnormal R-wave progression, early transition LVH with secondary repolarization abnormality Non-specific St-t changes compared to prior Confirmed by Duffy Bruce [phone]) on 09/07/2017 1:36:07 PM       Radiology Dg Chest 2 View  Result Date: 09/07/2017 CLINICAL DATA:  Chest pain last night for 2 hours that resolve. No associated symptoms. History of hypertension, obstructive cardiomyopathy, Panorex is mole atrial fibrillation. EXAM: CHEST  2 VIEW COMPARISON:  Chest x-ray of November 28, 2014 FINDINGS: The lungs are adequately inflated. There is no focal infiltrate. There is linear increased density in the retrosternal region on the lateral view which is new and likely lies on the left in the lingula. The heart is top-normal in size. The pulmonary vascularity is normal. There is tortuosity of the descending thoracic aorta. There is calcification in the wall of the aortic arch. There is mild multilevel degenerative disc disease. IMPRESSION: Mild chronic bronchitic changes, stable. Probable lingular subsegmental atelectasis. Stable cardiomegaly without pulmonary edema. Thoracic aortic atherosclerosis. Electronically Signed   By: David  Martinique M.D.   On: 09/07/2017 11:56    Procedures Procedures (including critical care time)  Medications Ordered in ED Medications   nitroGLYCERIN (NITROSTAT) SL tablet 0.4 mg (0.4 mg Sublingual Given 09/07/17 1235)  aspirin chewable tablet 324 mg (324 mg Oral Given 09/07/17 1206)     Initial Impression / Assessment and Plan / ED Course  I have reviewed the triage vital signs and the nursing notes.  Pertinent labs & imaging results that were available during my care of the patient were reviewed by me and considered in my medical decision  making (see chart for details).     82 yo F here with atypical chest pressure/pain. H/o HOCM, PAF, DVT/PE on Xarelto. Pressure improved with nitroglycerin here. No apparent ischemia on EKG and initial trop is neg. BNP mildly elevated but does not appear overtly hypervolemic on exam. Last stress test was in 2016 and was normal.  While etiology of CP remains unclear, pt has multiple cardiovascular risk factors. I feel it's reasonable to bring her into [facility] for echocardiogram, serial trops. She does have h/o PE but is not hypoxic, has normal WOB, and any kind of sub/massive PE that would change management other than Xarelto could likely show signs of stress on a TTE. Will d/w Cardiology, otherwise plan to admit for observation.  D/w Dr. Acie Fredrickson, He will see pt tomorrow. OK to admit to hospitalist. Pt CP/pressure free.  Final Clinical Impressions(s) / ED Diagnoses   Final diagnoses:  Atypical chest pain    ED Discharge Orders    None       Duffy Bruce, MD 09/07/17 1422

## 2017-09-07 NOTE — ED Notes (Signed)
ED TO INPATIENT HANDOFF REPORT  Name/Age/Gender Monique [name] 82 y.o. female  Code Status Code Status History    Date Active Date Inactive Code Status Order ID Comments User Context   11/21/2014 16:13 11/28/2014 15:12 Full Code [id number]  Charlynne Cousins, MD ED   06/27/2014 06:45 07/01/2014 16:01 Full Code [id number]  Phillips Grout, MD Inpatient      Home/SNF/Other Home  Chief Complaint chest pain   Level of Care/Admitting Diagnosis ED Disposition    ED Disposition Condition [facility] Area: [facility] [[id number]]  Level of Care: Telemetry [5]  Admit to tele based on following criteria: Complex arrhythmia (Bradycardia/Tachycardia)  Diagnosis: Chest pain [601093]  Admitting Physician: Maryruth Hancock  Attending Physician: Theodis Blaze [[id number]]  PT Class (Do Not Modify): Observation [104]  PT Acc Code (Do Not Modify): Observation [10022]       Medical History Past Medical History:  Diagnosis Date  . Abdominal pain 11/18/2015  . Acute respiratory failure with hypoxia ([place]) 06/27/2014  . Breast pain, left 10/11/2016  . Cerebral aneurysm 2000 06/27/2014  . Chest pain 10/30/2014  . CHF (congestive heart failure) ([place])   . Chronic anticoagulation 06/2014   Coumadin started after bilateral PE  . Chronic kidney disease, stage III (moderate) ([place]) 11/21/2014  . Clotting disorder ([place])    right was removed, still have left cataract  . Dizziness and giddiness 11/28/2014  . DVT of lower extremity (deep venous thrombosis) ([place]) 11/28/2014  . Dysuria 07/07/2014  . Elevated troponin 11/28/2014  . Fatty liver   . Gastritis 05/16/2012   Chronic dyspepsia on PPI daily.   Marland Kitchen Headache, acute 06/08/2010   Qualifier: Diagnosis of  By: Linda Hedges MD, Heinz Knuckles   . Heart murmur   . Hematochezia 11/18/2015  . History of fibromuscular dysplasia 06/27/2014  . HTN (hypertension)    Essential  . Hyperlipidemia   . Hypertrophic obstructive cardiomyopathy  (HOCM) ([place]) 05/20/2008   Qualifier: Diagnosis of  By: Linda Hedges MD, Heinz Knuckles   . Kidney stones   . LVH (left ventricular hypertrophy)   . Orthostatic hypotension 11/28/2014  . PAF (paroxysmal atrial fibrillation) ([place]) 2000  . Palpitations 11/28/2014  . PAROXYSMAL ATRIAL FIBRILLATION 05/20/2008   Qualifier: Diagnosis of  By: Linda Hedges MD, Heinz Knuckles   . PE (pulmonary embolism) 06/2014   Bilateral  . Pulmonary embolism, bilateral ([place]) 06/27/2014  . SOB (shortness of breath) 06/27/2014  . Subarachnoid hemorrhage ([place]) 2000  . UTI (urinary tract infection) 01/04/2015    Allergies No Known Allergies  IV Location/Drains/Wounds Patient Lines/Drains/Airways Status   Active Line/Drains/Airways    Name:   Placement date:   Placement time:   Site:   Days:   Peripheral IV 09/07/17 Left Antecubital   09/07/17    1123    Antecubital   less than 1          Labs/Imaging Results for orders placed or performed during the hospital encounter of 09/07/17 (from the past 48 hour(s))  Basic metabolic panel     Status: Abnormal   Collection Time: 09/07/17 11:07 AM  Result Value Ref Range   Sodium 139 135 - 145 mmol/L   Potassium 3.8 3.5 - 5.1 mmol/L   Chloride 105 101 - 111 mmol/L   CO2 27 22 - 32 mmol/L   Glucose, Bld 266 (H) 65 - 99 mg/dL   BUN 15 6 - 20 mg/dL   Creatinine, Ser 1.39 (H) 0.44 -  1.00 mg/dL   Calcium 9.3 8.9 - 10.3 mg/dL   GFR calc non Af Amer 35 (L) >60 mL/min   GFR calc Af Amer 41 (L) >60 mL/min    Comment: (NOTE) The eGFR has been calculated using the CKD EPI equation. This calculation has not been validated in all clinical situations. eGFR's persistently <60 mL/min signify possible Chronic Kidney Disease.    Anion gap 7 5 - 15  CBC     Status: None   Collection Time: 09/07/17 11:07 AM  Result Value Ref Range   WBC 8.0 4.0 - 10.5 K/uL   RBC 4.46 3.87 - 5.11 MIL/uL   Hemoglobin 13.8 12.0 - 15.0 g/dL   HCT 41.3 36.0 - 46.0 %   MCV 92.6 78.0 - 100.0 fL   MCH 30.9 26.0 - 34.0  pg   MCHC 33.4 30.0 - 36.0 g/dL   RDW 13.6 11.5 - 15.5 %   Platelets 195 150 - 400 K/uL  Brain natriuretic peptide     Status: Abnormal   Collection Time: 09/07/17 11:07 AM  Result Value Ref Range   B Natriuretic Peptide 122.5 (H) 0.0 - 100.0 pg/mL  I-stat troponin, ED     Status: None   Collection Time: 09/07/17 11:33 AM  Result Value Ref Range   Troponin i, poc 0.00 0.00 - 0.08 ng/mL   Comment 3            Comment: Due to the release kinetics of cTnI, a negative result within the first hours of the onset of symptoms does not rule out myocardial infarction with certainty. If myocardial infarction is still suspected, repeat the test at appropriate intervals.   Troponin I     Status: None   Collection Time: 09/07/17  4:15 PM  Result Value Ref Range   Troponin I <0.03 <0.03 ng/mL   Dg Chest 2 View  Result Date: 09/07/2017 CLINICAL DATA:  Chest pain last night for 2 hours that resolve. No associated symptoms. History of hypertension, obstructive cardiomyopathy, Panorex is mole atrial fibrillation. EXAM: CHEST  2 VIEW COMPARISON:  Chest x-ray of November 28, 2014 FINDINGS: The lungs are adequately inflated. There is no focal infiltrate. There is linear increased density in the retrosternal region on the lateral view which is new and likely lies on the left in the lingula. The heart is top-normal in size. The pulmonary vascularity is normal. There is tortuosity of the descending thoracic aorta. There is calcification in the wall of the aortic arch. There is mild multilevel degenerative disc disease. IMPRESSION: Mild chronic bronchitic changes, stable. Probable lingular subsegmental atelectasis. Stable cardiomegaly without pulmonary edema. Thoracic aortic atherosclerosis. Electronically Signed   By: David  Martinique M.D.   On: 09/07/2017 11:56    Pending Labs Unresulted Labs (From admission, onward)   Start     Ordered   09/08/17 0500  TSH  Tomorrow morning,   R     09/07/17 1607   Signed and  Held  Troponin I-serum (0, 3, 6 hours)  Now then every 3 hours,   TIMED     Signed and Held      Vitals/Pain Today's Vitals   09/07/17 1300 09/07/17 1421 09/07/17 1530 09/07/17 1600  BP: 120/68 119/70 123/77 132/78  Pulse: (!) 53 70 64 65  Resp: 14 20 17 17   Temp:  98.6 F (37 C)    TempSrc:  Oral    SpO2: 95% 99% 97% 95%  Weight:      Height:  Isolation Precautions No active isolations  Medications Medications  nitroGLYCERIN (NITROSTAT) SL tablet 0.4 mg (0.4 mg Sublingual Given 09/07/17 1235)  amLODipine (NORVASC) tablet 5 mg (not administered)  metoprolol tartrate (LOPRESSOR) tablet 25 mg (not administered)  mirtazapine (REMERON) tablet 15 mg (not administered)  montelukast (SINGULAIR) tablet 10 mg (not administered)  nitroGLYCERIN (NITROSTAT) SL tablet 0.4 mg (not administered)  pantoprazole (PROTONIX) EC tablet 40 mg (not administered)  Rivaroxaban (XARELTO) tablet 15 mg (not administered)  sertraline (ZOLOFT) tablet 50 mg (not administered)  simvastatin (ZOCOR) tablet 40 mg (not administered)  disopyramide (NORPACE) capsule 150 mg (not administered)  aspirin chewable tablet 324 mg (324 mg Oral Given 09/07/17 1206)    Mobility walks

## 2017-09-08 ENCOUNTER — Ambulatory Visit (HOSPITAL_COMMUNITY): Admission: AD | Admit: 2017-09-08 | Payer: Medicare Other | Source: Ambulatory Visit | Admitting: Internal Medicine

## 2017-09-08 ENCOUNTER — Observation Stay (HOSPITAL_COMMUNITY): Payer: Medicare Other

## 2017-09-08 ENCOUNTER — Ambulatory Visit (HOSPITAL_BASED_OUTPATIENT_CLINIC_OR_DEPARTMENT_OTHER)
Admit: 2017-09-08 | Discharge: 2017-09-08 | Disposition: A | Discharge status: Home / Self Care | Payer: Medicare Other | Attending: Cardiology | Admitting: Cardiology

## 2017-09-08 DIAGNOSIS — I472 Ventricular tachycardia: Secondary | ICD-10-CM | POA: Diagnosis not present

## 2017-09-08 DIAGNOSIS — R079 Chest pain, unspecified: Secondary | ICD-10-CM

## 2017-09-08 DIAGNOSIS — I421 Obstructive hypertrophic cardiomyopathy: Secondary | ICD-10-CM | POA: Diagnosis not present

## 2017-09-08 LAB — BASIC METABOLIC PANEL
ANION GAP: 7 (ref 5–15)
BUN: 14 mg/dL (ref 6–20)
CO2: 27 mmol/L (ref 22–32)
Calcium: 9.2 mg/dL (ref 8.9–10.3)
Chloride: 106 mmol/L (ref 101–111)
Creatinine, Ser: 1.18 mg/dL — ABNORMAL HIGH (ref 0.44–1.00)
GFR calc Af Amer: 49 mL/min — ABNORMAL LOW (ref >60)
GFR, EST NON AFRICAN AMERICAN: 43 mL/min — AB (ref >60)
GLUCOSE: 156 mg/dL — AB (ref 65–99)
POTASSIUM: 3.6 mmol/L (ref 3.5–5.1)
SODIUM: 140 mmol/L (ref 135–145)

## 2017-09-08 LAB — NM MYOCAR MULTI W/SPECT W/WALL MOTION / EF
CHL RATE OF PERCEIVED EXERTION: 0
CSEPED: 0 min
Estimated workload: 1 METS
Exercise duration (sec): 0 s
MPHR: 141 {beats}/min
Peak HR: 80 {beats}/min
Percent HR: 56 %
Rest HR: 61 {beats}/min

## 2017-09-08 LAB — LIPID PANEL
CHOLESTEROL: 141 mg/dL (ref 0–200)
HDL: 52 mg/dL (ref >40)
LDL Cholesterol: 66 mg/dL (ref 0–99)
TRIGLYCERIDES: 113 mg/dL (ref <150)
Total CHOL/HDL Ratio: 2.7 RATIO
VLDL: 23 mg/dL (ref 0–40)

## 2017-09-08 LAB — HEMOGLOBIN A1C
HEMOGLOBIN A1C: 7.8 % — AB (ref 4.8–5.6)
Mean Plasma Glucose: 177.16 mg/dL

## 2017-09-08 LAB — TSH: TSH: 2.829 u[IU]/mL (ref 0.350–4.500)

## 2017-09-08 LAB — TROPONIN I: TROPONIN I: 0.03 ng/mL — AB (ref <0.03)

## 2017-09-08 MED ORDER — TECHNETIUM TC 99M TETROFOSMIN IV KIT
10.0000 | PACK | Freq: Once | INTRAVENOUS | Status: AC | PRN
  Administered 2017-09-08: 10 via INTRAVENOUS

## 2017-09-08 MED ORDER — REGADENOSON 0.4 MG/5ML IV SOLN
0.4000 mg | Freq: Once | INTRAVENOUS | Status: DC
  Filled 2017-09-08: qty 5

## 2017-09-08 MED ORDER — REGADENOSON 0.4 MG/5ML IV SOLN
INTRAVENOUS | Status: AC
  Administered 2017-09-08: 0.4 mg
  Filled 2017-09-08: qty 5

## 2017-09-08 MED ORDER — TECHNETIUM TC 99M TETROFOSMIN IV KIT
30.0000 | PACK | Freq: Once | INTRAVENOUS | Status: AC | PRN
  Administered 2017-09-08: 30 via INTRAVENOUS

## 2017-09-08 NOTE — Progress Notes (Signed)
PROGRESS NOTE    Monique [name]  HCW:[id number] DOB: November 03, 1935 DOA: 09/07/2017 PCP: Monique Koch, MD   Brief Narrative:  Monique [name] is Monique [name] 82 y.o. 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 Patient denies similar episodes in the past. Pt also reports occasional associated mild exertional dyspnea. Pt denies fevers, chills, no recent sick contacts or long distance traveling. Patient reports compliance with medications. Patient denies any abd or urinary concerns.   Assessment & Plan:   Active Problems:   Chest pain   Chest pain - unclear etiology at this time, currently CP free - planning for stress with cards - plan to cycle CE's (final troponin 0.03) - check TSH (normal) - follow A1c (abnormal) and lipids (normal LDL) - obtain ECHO pending - provide analgesia as needed - cards c/s, appreciate recs   Nonsustained Vtach - continue beta blocker - follow echo    HTN  - resume home regimen with Amlodipine and Metoprolol    HLD - continue Simvastatin per home medical regimen     DVT/PE - resume home regimen with Xarelto     Bradycardia  - noted HR in ED in 50's - currently HR in 70's, will need to monitor on tele - if HR drops < 50 further dose adjustment in Metoprolol may be needed     CKD stage III - review of records at least 6 months prior to this admission indicate baseline Cr 1.3 - 1.4 - Cr is currently at baseline   DVT prophylaxis: lovenox Code Status: full  Family Communication: son at bedside Disposition Plan: pending  Consultants:   cardiology  Procedures: (Don't include imaging studies which can be auto populated. Include  things that cannot be auto populated i.e. Echo, Carotid and venous dopplers, Foley, Bipap, HD, tubes/drains, wound vac, central lines etc)  Echo and stress test pending  Antimicrobials: (specify start and planned stop date. Auto populated tables are space occupying and do not give end dates)  none    Subjective: CP, pressure, night before last, constant.  No CP with exertion.  Hasn't had pain like that before.  Not sure what made it go away.   Objective: Vitals:   09/07/17 2122 09/08/17 0213 09/08/17 0420 09/08/17 0923  BP: 138/78 127/70 (!) 142/69 (!) 143/63  Pulse: 62 (!) 56 (!) 57 62  Resp: 16 16 18    Temp: 97.8 F (36.6 C) 97.9 F (36.6 C) 98 F (36.7 C)   TempSrc: Oral Oral Oral   SpO2: 97% 93% 96%   Weight:      Height:        Intake/Output Summary (Last 24 hours) at 09/08/2017 1058 Last data filed at 09/08/2017 0900 Gross per 24 hour  Intake 240 ml  Output -  Net 240 ml   Filed Weights   09/07/17 1101 09/07/17 1829  Weight: 74.8 kg (165 lb) 77.8 kg (171 lb 8 oz)    Examination:  General exam: Appears calm and comfortable  Respiratory system: Clear to auscultation. Respiratory effort normal. Cardiovascular system: S1 & S2 heard, RRR. No JVD, murmurs, rubs, gallops or clicks. No pedal edema. Gastrointestinal system: Abdomen is nondistended, soft and nontender. No organomegaly or masses felt. Normal bowel sounds heard.  Central nervous system: Alert and oriented. No focal neurological deficits. Extremities: Symmetric 5 x 5 power. Skin: No rashes, lesions or ulcers Psychiatry: Judgement and insight appear normal. Mood & affect appropriate.     Data Reviewed: I have personally reviewed following labs and imaging studies  CBC: Recent Labs  Lab 09/07/17 1107  WBC 8.0  HGB 13.8  HCT 41.3  MCV 92.6  PLT 676   Basic Metabolic Panel: Recent Labs  Lab 09/07/17 1107 09/08/17 0816  NA 139 140  K 3.8 3.6  CL 105 106  CO2 27 27  GLUCOSE 266* 156*  BUN 15  14  CREATININE 1.39* 1.18*  CALCIUM 9.3 9.2   GFR: Estimated Creatinine Clearance: 40.7 mL/min (Monique [name]) (by C-G formula based on SCr of 1.18 mg/dL (H)). Liver Function Tests: No results for input(s): AST, ALT, ALKPHOS, BILITOT, PROT, ALBUMIN in the last 168 hours. No results for input(s): LIPASE, AMYLASE in the last 168 hours. No results for input(s): AMMONIA in the last 168 hours. Coagulation Profile: No results for input(s): INR, PROTIME in the last 168 hours. Cardiac Enzymes: Recent Labs  Lab 09/07/17 1615 09/07/17 1843 09/07/17 2111 09/08/17 0023  TROPONINI <0.03 <0.03 <0.03 0.03*   BNP (last 3 results) Recent Labs    07/03/17 1112  PROBNP 183   HbA1C: No results for input(s): HGBA1C in the last 72 hours. CBG: No results for input(s): GLUCAP in the last 168 hours. Lipid Profile: No results for input(s): CHOL, HDL, LDLCALC, TRIG, CHOLHDL, LDLDIRECT in the last 72 hours. Thyroid Function Tests: Recent Labs    09/08/17 0023  TSH 2.829   Anemia Panel: No results for input(s): VITAMINB12, FOLATE, FERRITIN, TIBC, IRON, RETICCTPCT in the last 72 hours. Sepsis Labs: No results for input(s): PROCALCITON, LATICACIDVEN in the last 168 hours.  No results found for this or any previous visit (from the past 240 hour(s)).       Radiology Studies: Dg Chest 2 View  Result Date: 09/07/2017 CLINICAL DATA:  Chest pain last night for 2 hours that resolve. No associated symptoms. History of hypertension, obstructive cardiomyopathy, Panorex is mole atrial fibrillation. EXAM: CHEST  2 VIEW COMPARISON:  Chest x-ray of November 28, 2014 FINDINGS: The lungs are adequately inflated. There is no focal infiltrate. There is linear increased density in the retrosternal region on the lateral view which is new and likely lies on the left in the lingula. The heart is top-normal in size. The pulmonary vascularity is normal. There is tortuosity of the descending thoracic aorta. There is calcification in  the wall of the aortic arch. There is mild multilevel degenerative disc disease. IMPRESSION: Mild chronic bronchitic changes, stable. Probable lingular subsegmental atelectasis. Stable cardiomegaly without pulmonary edema. Thoracic aortic atherosclerosis. Electronically Signed   By: David  Martinique M.D.   On: 09/07/2017 11:56        Scheduled Meds: . amLODipine  5 mg Oral BID  . atorvastatin  20 mg Oral q1800  . disopyramide  150 mg Oral BID  . metoprolol tartrate  25 mg Oral BID  . mirtazapine  15 mg Oral QHS  . montelukast  10 mg Oral QHS  . pantoprazole  40 mg Oral Daily  . Rivaroxaban  15 mg Oral Q supper  . sertraline  50 mg Oral Daily   Continuous Infusions:   LOS: 0 days    Time spent: over 30 min    Fayrene Helper, MD [facility] Pager [phone]  If 7PM-7AM, please contact night-coverage [email or web address] Password  [email or web address] 09/08/2017, 10:58 AM

## 2017-09-08 NOTE — CV Procedure (Signed)
Echocardiogram not completed because the patient was undergoing other tests.  Darlina Sicilian RDCS

## 2017-09-08 NOTE — Progress Notes (Signed)
CRITICAL VALUE ALERT  Critical Value:  Trop 0.03  Date & Time Notied:  09/08/16  Provider Notified: Bodenheimer  Orders Received/Actions taken: no new orders placed

## 2017-09-08 NOTE — Progress Notes (Signed)
Patient had a 11 beat run of vtach. Patient asymptomatic. NP  on call paged.

## 2017-09-08 NOTE — Progress Notes (Signed)
Monique [name] requested nurse to make patient aware of stress test results being low risk. Nurse informed patient.

## 2017-09-08 NOTE — Progress Notes (Signed)
Progress Note  Patient Name: Monique [name] Date of Encounter: 09/08/2017  Primary Cardiologist: Dr. Harrington Challenger  Subjective   Pt doing well this morning. No chest pain or palpitations overnight. Short, 6-9 beat run of VT approximately 0406 this AM. Pt asymptomatic. Checking BMET this AM before procedure.   Inpatient Medications    Scheduled Meds: . amLODipine  5 mg Oral BID  . atorvastatin  20 mg Oral q1800  . disopyramide  150 mg Oral BID  . metoprolol tartrate  25 mg Oral BID  . mirtazapine  15 mg Oral QHS  . montelukast  10 mg Oral QHS  . pantoprazole  40 mg Oral Daily  . Rivaroxaban  15 mg Oral Q supper  . sertraline  50 mg Oral Daily   Continuous Infusions:  PRN Meds: acetaminophen, nitroGLYCERIN, ondansetron (ZOFRAN) IV   Vital Signs    Vitals:   09/07/17 1829 09/07/17 2122 09/08/17 0213 09/08/17 0420  BP: (!) 171/88 138/78 127/70 (!) 142/69  Pulse: 66 62 (!) 56 (!) 57  Resp: 18 16 16 18   Temp: 98 F (36.7 C) 97.8 F (36.6 C) 97.9 F (36.6 C) 98 F (36.7 C)  TempSrc: Oral Oral Oral Oral  SpO2: 99% 97% 93% 96%  Weight: 171 lb 8 oz (77.8 kg)     Height: 5\' 6"  (1.676 m)       Intake/Output Summary (Last 24 hours) at 09/08/2017 0755 Last data filed at 09/08/2017 0600 Gross per 24 hour  Intake 240 ml  Output -  Net 240 ml   Filed Weights   09/07/17 1101 09/07/17 1829  Weight: 165 lb (74.8 kg) 171 lb 8 oz (77.8 kg)    Physical Exam   General: Well developed, well nourished, NAD Skin: Warm, dry, intact  Head: Normocephalic, atraumatic, clear, moist mucus membranes. Neck: Negative for carotid bruits. No JVD Lungs:Clear to ausculation bilaterally. No wheezes, rales, or rhonchi. Breathing is unlabored. Cardiovascular: RRR with S1 S2. 2/6 systolic murmur  no rubs, or gallops Abdomen: Soft, non-tender, non-distended with normoactive bowel sounds. No obvious abdominal masses. MSK: Strength and tone appear normal for age. 5/5 in all extremities Extremities:  No edema. No clubbing or cyanosis. DP/PT pulses 2+ bilaterally Neuro: Alert and oriented. No focal deficits. No facial asymmetry. MAE spontaneously. Psych: Responds to questions appropriately with normal affect.    Labs    Chemistry Recent Labs  Lab 09/07/17 1107  NA 139  K 3.8  CL 105  CO2 27  GLUCOSE 266*  BUN 15  CREATININE 1.39*  CALCIUM 9.3  GFRNONAA 35*  GFRAA 41*  ANIONGAP 7     Hematology Recent Labs  Lab 09/07/17 1107  WBC 8.0  RBC 4.46  HGB 13.8  HCT 41.3  MCV 92.6  MCH 30.9  MCHC 33.4  RDW 13.6  PLT 195    Cardiac Enzymes Recent Labs  Lab 09/07/17 1615 09/07/17 1843 09/07/17 2111 09/08/17 0023  TROPONINI <0.03 <0.03 <0.03 0.03*    Recent Labs  Lab 09/07/17 1133  TROPIPOC 0.00     BNP Recent Labs  Lab 09/07/17 1107  BNP 122.5*     DDimer No results for input(s): DDIMER in the last 168 hours.   Radiology    Dg Chest 2 View  Result Date: 09/07/2017 CLINICAL DATA:  Chest pain last night for 2 hours that resolve. No associated symptoms. History of hypertension, obstructive cardiomyopathy, Panorex is mole atrial fibrillation. EXAM: CHEST  2 VIEW COMPARISON:  Chest  x-ray of November 28, 2014 FINDINGS: The lungs are adequately inflated. There is no focal infiltrate. There is linear increased density in the retrosternal region on the lateral view which is new and likely lies on the left in the lingula. The heart is top-normal in size. The pulmonary vascularity is normal. There is tortuosity of the descending thoracic aorta. There is calcification in the wall of the aortic arch. There is mild multilevel degenerative disc disease. IMPRESSION: Mild chronic bronchitic changes, stable. Probable lingular subsegmental atelectasis. Stable cardiomegaly without pulmonary edema. Thoracic aortic atherosclerosis. Electronically Signed   By: David  Martinique M.D.   On: 09/07/2017 11:56    Telemetry    09/08/17 NSR 62, 0406 am pt had 6-9 beat run of VT- Personally  Reviewed  ECG    09/07/17-SR 72  - Personally Reviewed  Cardiac Studies   ECHO: 11/24/14 Study Conclusions  - Left ventricle: The cavity size was normal. Wall thickness was increased in a pattern of severe LVH. Systolic function was vigorous. The estimated ejection fraction was in the range of 65% to 70%. There was dynamic obstruction. Wall motion was normal; there were no regional wall motion abnormalities. Doppler parameters are consistent with abnormal left ventricular relaxation (grade 1 diastolic dysfunction). - Aortic valve: Valve area (VTI): 2.5 cm^2. Valve area (Vmax): 2.62 cm^2. - Mitral valve: There was mild regurgitation.  Transthoracic echocardiography. M-mode, complete 2D, spectral Doppler, and color Doppler. Birthdate: Patient birthdate: 02/15/1936. Age: Patient is 82 yr old. Sex: Gender: female. BMI: 24 kg/m^2. Blood pressure:   140/6 Patient status: Inpatient. Study date: Study date: 11/24/2014. Study time: 12:59 PM. Location: Bedside.  Myoview stress 11/2014: Normal   CATH: NA  Patient Profile     82 y.o. female with a hx of HOCM (initial dx 2009) last evaluated 11/2014 per echo which revealed LVEF 65-70% with dynamic obstruction and severe LVH and Grade I diastolic dysfunction, PAF on Xarelto, bilateral PE (11/2014 while on therapeutic coumadin), DVT(06/2014), CKD stage III, HTN  and HLD who is being seen today for the evaluation of chest pain at the request of Dr. Ellender Hose.   She had a previous low risk myoview stress test 11/2014. There is an echo pending this admission.   Assessment & Plan    1. Chest pain: -Troponin level <0.03, <0.03, <0.03, 0.03 today  -For Lexiscan stress test today, 09/08/17 -EKG unremarkable with old T-wave inversions in leads I, aVL consistent with previous tracings -6-9 beat run of VT on tele approximately 0406 this AM, pt asymptomatic. She has Hx of HOCM  -Discussed pursuing stress with MD> will  proceed with scheduled procedure -Chest pain free, no other associated symptoms   2. CKD Stage III: -Stable, cr 1.39 -Will check stat BMET to assess renal and metabolic function before procedure today    3. Hx of HOCM: -Echo planned for today to evaluate gradient and thickness, last evaluation 2016  -Disopyramide 150mg  BID  4. Paroxysmal Afib: -Maintaining NSR  -Metoprolol for rate control  -Continue Xarelto  5. HTN: -Metoprolol 25mg  BID, amlodipine 5mg   -Stable, 142/69>127/70>138/78  Signed, Kathyrn Drown, NP  09/08/2017, 7:55 AM    For questions or updates, please contact   Please consult [email or web address] for contact info under Cardiology/STEMI.  Attending Note:   The patient was seen and examined.  Agree with assessment and plan as noted above.  Changes made to the above note as needed.  Patient seen and independently examined with Kathyrn Drown, NP .   We discussed all  aspects of the encounter. I agree with the assessment and plan as stated above.  1.   Chest pain :   troponins are negative.   ( 1 single troponin was 0.03)  No further episodes of CP  For myoview today   2.   Nonsustained VT:   Benign.  Continue metoprolol 25 BID  3. HOCM:   Continue metoprolol and Norpace    I have spent a total of 40 minutes with patient reviewing hospital  notes , telemetry, EKGs, labs and examining patient as well as establishing an assessment and plan that was discussed with the patient. > 50% of time was spent in direct patient care.    Thayer Headings, Brooke Bonito., MD, [facility] 09/08/2017, 9:21 AM [address]  [place] Pager [phone]

## 2017-09-08 NOTE — Progress Notes (Signed)
nuc was low risk study.  I asked RN to inform pt.  Dr. Florene Glen also aware,  I would wait for echo prior to discharge - she does have a follow up with Dr. Harrington Challenger 10/09/17  At 1000.

## 2017-09-08 NOTE — Progress Notes (Signed)
lexiscan portion completed - results to follow.

## 2017-09-08 NOTE — Progress Notes (Signed)
Pt had Lexiscan stress today, 09/18/17 which was low risk. She was transported back to [facility]. Echocardiogram is currently pending. I have made her a follow-up appointment with Dr. Harrington Challenger on 09/14/17 at 420 pm. Her appointment for 10/09/17 with Dr. Harrington Challenger was not cancelled in case there is still a need to keep this one.  Thank you Kathyrn Drown, NP

## 2017-09-09 ENCOUNTER — Observation Stay (HOSPITAL_BASED_OUTPATIENT_CLINIC_OR_DEPARTMENT_OTHER): Payer: Medicare Other

## 2017-09-09 DIAGNOSIS — I2782 Chronic pulmonary embolism: Secondary | ICD-10-CM | POA: Diagnosis not present

## 2017-09-09 DIAGNOSIS — R079 Chest pain, unspecified: Secondary | ICD-10-CM | POA: Diagnosis not present

## 2017-09-09 DIAGNOSIS — I421 Obstructive hypertrophic cardiomyopathy: Secondary | ICD-10-CM | POA: Diagnosis not present

## 2017-09-09 LAB — ECHOCARDIOGRAM COMPLETE
EERAT: 4.15
FS: 38 % (ref 28–44)
Height: 66 in
IV/PV OW: 2.15
LA diam end sys: 27.4 mm
LADIAMINDEX: 1.45 cm/m2
LASIZE: 27.4 mm
LDCA: 3.37 cm2
LV E/e' medial: 4.15
LV TDI E'MEDIAL: 4.61
LV e' LATERAL: 7.96 cm/s
LVEEAVG: 4.15
LVOT diameter: 20.7 mm
MV pk A vel: 88.2 m/s
MV pk E vel: 33 m/s
PW: 10.6 mm — AB (ref 0.6–1.1)
TDI e' lateral: 7.96
Weight: 2744 oz

## 2017-09-09 LAB — MAGNESIUM: Magnesium: 1.8 mg/dL (ref 1.7–2.4)

## 2017-09-09 LAB — CBC
HCT: 39.9 % (ref 36.0–46.0)
Hemoglobin: 13.3 g/dL (ref 12.0–15.0)
MCH: 30.9 pg (ref 26.0–34.0)
MCHC: 33.3 g/dL (ref 30.0–36.0)
MCV: 92.8 fL (ref 78.0–100.0)
PLATELETS: 198 10*3/uL (ref 150–400)
RBC: 4.3 MIL/uL (ref 3.87–5.11)
RDW: 13.9 % (ref 11.5–15.5)
WBC: 9.7 10*3/uL (ref 4.0–10.5)

## 2017-09-09 LAB — BASIC METABOLIC PANEL
Anion gap: 6 (ref 5–15)
BUN: 15 mg/dL (ref 6–20)
CO2: 26 mmol/L (ref 22–32)
Calcium: 9.2 mg/dL (ref 8.9–10.3)
Chloride: 109 mmol/L (ref 101–111)
Creatinine, Ser: 1.2 mg/dL — ABNORMAL HIGH (ref 0.44–1.00)
GFR calc Af Amer: 48 mL/min — ABNORMAL LOW (ref >60)
GFR, EST NON AFRICAN AMERICAN: 42 mL/min — AB (ref >60)
GLUCOSE: 164 mg/dL — AB (ref 65–99)
POTASSIUM: 3.4 mmol/L — AB (ref 3.5–5.1)
Sodium: 141 mmol/L (ref 135–145)

## 2017-09-09 MED ORDER — ATORVASTATIN CALCIUM 20 MG PO TABS: 20.0000 mg | ORAL_TABLET | Freq: Every day | ORAL | 0 refills | Status: DC

## 2017-09-09 MED ORDER — METFORMIN HCL 500 MG PO TABS: 500.0000 mg | ORAL_TABLET | Freq: Every day | ORAL | 0 refills | Status: DC

## 2017-09-09 MED ORDER — POTASSIUM CHLORIDE CRYS ER 20 MEQ PO TBCR
40.0000 meq | EXTENDED_RELEASE_TABLET | Freq: Once | ORAL | Status: AC
  Administered 2017-09-09: 40 meq via ORAL
  Filled 2017-09-09: qty 2

## 2017-09-09 NOTE — Discharge Summary (Signed)
Physician Discharge Summary  Skyylar Kopf TKZ:[id number] DOB: [birth year] DOA: 09/07/2017  PCP: Hoyt Koch, MD  Admit date: 09/07/2017 Discharge date: 09/09/2017  Time spent: over 30 minutes  Recommendations for Outpatient Follow-up:  1. Follow up outpatient CBC/BMP 2. Follow up with cardiology as outpatient, per Dr. Caryl Comes, consider trying verap/dilt vs amlodipine 3. Follow up outpatient A1c and newly diagnosed diabetes (started on metformin at discharge, switched to atorvastatin - no asa due to anticoagulation)   The 10-year ASCVD risk score Mikey Bussing DC Brooke Bonito., et al., 2013) is: 13.4%   Values used to calculate the score:     Age: 82 years     Sex: Female     Is Non-Hispanic African American: Yes     Diabetic: No     Tobacco smoker: No     Systolic Blood Pressure: 573 mmHg     Is BP treated: Yes     HDL Cholesterol: 52 mg/dL     Total Cholesterol: 141 mg/dL  Discharge Diagnoses:  Active Problems:   Chest pain  Discharge Condition: stable  Diet recommendation: heart healthy  Filed Weights   09/07/17 1101 09/07/17 1829  Weight: 74.8 kg (165 lb) 77.8 kg (171 lb 8 oz)    History of present illness:  Chaia Ikard a 82 y.o.femalewithknown 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 Patient denies similar episodes in the past. Pt also reports occasional associated mild exertional dyspnea. Pt denies fevers, chills, no recent sick contacts or long distance traveling. Patient reports compliance with medications. Patient denies any abd or urinary concerns.  Hospital Course:  Chest pain - unclear etiology at this time, currently CP free - pt with low risk stress test - plan to  cycle CE's (final troponin 0.03) - check TSH (normal) - follow A1c (abnormal) and lipids (normal LDL) - obtain ECHO pending - provide analgesia as needed - cards c/s, appreciate recs   T2DM: started on metformin at discharge, outpatient f/u  Nonsustained Vtach - continue beta blocker  HTN - resume home regimen with Amlodipine and Metoprolol  HLD - continue Simvastatin per home medical regimen   DVT/PE - resume home regimen with Xarelto   Bradycardia - noted HR in ED in 50's - currently HR in 70's, will need to monitor on tele - if HR drops <50 further dose adjustment in Metoprolol may be needed   CKD stage III - review of records at least 6 months prior to this admission indicate baseline Cr 1.3 - 1.4 - Cr is currently at baseline  HOCM: repeat echo as below, on metop and norpace  Procedures: 09/09/17 echo Study Conclusions  - Left ventricle: The cavity size was normal. Wall thickness was   increased in a pattern of moderate LVH. Systolic function was   vigorous. The estimated ejection fraction was in the range of 65%   to 70%. There is an intracavitary gradient created by asymmetic   LVH, mild SAM, and hyperdnamic systolic function. Difficult   assessment of peak gradient from available tracings, peak   gradient 34 mmHg. No provocative maneuvers were performed.   Doppler parameters are consistent with abnormal left ventricular   relaxation (grade 1 diastolic dysfunction). - Aortic valve: Mildly calcified annulus. Mildly thickened  leaflets. Mean gradient (S): 21 mm Hg. Morphologically the valve   looks normal, the measured gradient appears to be subvalvular   from her dynamic intracaviatry gradient. Valve area (VTI): 1.55   cm^2. Valve area (Vmax): 2.05 cm^2. - Mitral valve: Mildly calcified annulus. Normal thickness leaflets   . - Technically difficult study.  09/08/17 Stress  There was no ST segment deviation noted during stress.  No T  wave inversion was noted during stress.  Defect 1: There is a small defect of mild severity present in the apical lateral location.  This is a low risk study.  Nuclear stress EF: 64%.   Low risk stress nuclear study with normal perfusion and normal left ventricular regional and global systolic function.  Consultations:  cardiology  Discharge Exam: Vitals:   09/09/17 0429 09/09/17 0936  BP: 131/69 130/71  Pulse: 62 68  Resp: 18   Temp: 98.1 F (36.7 C)   SpO2: 94%    No CP.   General: No acute distress. Cardiovascular: Heart sounds show a regular rate, and rhythm. No gallops or rubs. No murmurs. No JVD. Lungs: Clear to auscultation bilaterally with good air movement. No rales, rhonchi or wheezes. Abdomen: Soft, nontender, nondistended with normal active bowel sounds. No masses. No hepatosplenomegaly. Neurological: Alert and oriented 3. Moves all extremities 4 with equal strength. Cranial nerves II through XII grossly intact. Skin: Warm and dry. No rashes or lesions. Extremities: No clubbing or cyanosis. No edema. Psychiatric: Mood and affect are normal. Insight and judgment are appropriate  Discharge Instructions   Discharge Instructions    Call MD for:  difficulty breathing, headache or visual disturbances   Complete by:  As directed    Call MD for:  extreme fatigue   Complete by:  As directed    Call MD for:  hives   Complete by:  As directed    Call MD for:  redness, tenderness, or signs of infection (pain, swelling, redness, odor or green/yellow discharge around incision site)   Complete by:  As directed    Call MD for:  severe uncontrolled pain   Complete by:  As directed    Call MD for:  temperature >100.4   Complete by:  As directed    Diet - low sodium heart healthy   Complete by:  As directed    Discharge instructions   Complete by:  As directed    You were seen for chest pain.  Your stress test was low risk.  Please follow up with cardiology regarding  your chest pain and hypertrophic heart disease.  You have diabetes, we will start you on low dose metformin.  We changed your simvastatin to atorvastatin (stop the simvastatin).  Please follow up with your PCP regarding this.  Metformin can sometimes cause stomach upset, so we're starting a low dose.  Take it with food.  Return if you have new, worsening, or recurrent symptoms.  Please ask your PCP to request records so they know what was done.   Increase activity slowly   Complete by:  As directed      Allergies as of 09/09/2017   No Known Allergies     Medication List    STOP taking these medications   simvastatin 40 MG tablet Commonly known as:  ZOCOR     TAKE these medications   acetaminophen 500 MG tablet Commonly known as:  TYLENOL Take 1,000 mg by mouth.   amLODipine 5 MG tablet Commonly known as:  NORVASC  Take 1 tablet (5 mg total) by mouth 2 (two) times daily.   atorvastatin 20 MG tablet Commonly known as:  LIPITOR Take 1 tablet (20 mg total) by mouth daily at 6 PM.   disopyramide 150 MG capsule Commonly known as:  NORPACE Take 1 capsule (150 mg total) by mouth 2 (two) times daily.   metFORMIN 500 MG tablet Commonly known as:  GLUCOPHAGE Take 1 tablet (500 mg total) by mouth daily with breakfast.   metoprolol tartrate 25 MG tablet Commonly known as:  LOPRESSOR Take 1 tablet (25 mg total) by mouth 2 (two) times daily.   mirtazapine 15 MG tablet Commonly known as:  REMERON TAKE 1 TABLET (15 MG TOTAL) BY MOUTH AT BEDTIME.   montelukast 10 MG tablet Commonly known as:  SINGULAIR Take 1 tablet (10 mg total) by mouth at bedtime.   nitroGLYCERIN 0.4 MG SL tablet Commonly known as:  NITROSTAT Place 1 tablet (0.4 mg total) under the tongue every 5 (five) minutes as needed for chest pain.   pantoprazole 40 MG tablet Commonly known as:  PROTONIX TAKE 1 TABLET BY MOUTH EVERY DAY   Rivaroxaban 15 MG Tabs tablet Commonly known as:  XARELTO Take 1 tablet (15 mg  total) by mouth daily with supper.   sertraline 50 MG tablet Commonly known as:  ZOLOFT TAKE 1 TABLET (50 MG TOTAL) BY MOUTH DAILY.      No Known Allergies Follow-up Information    Fay Records, MD Follow up on 09/14/2017.   Specialty:  Cardiology Why:  Your appointment is at 4:20pm on 09/14/2017 with Dr. Harrington Challenger. Please arrive to your appointment at 4:05.  Contact information: [place] [id number] [phone]            The results of significant diagnostics from this hospitalization (including imaging, microbiology, ancillary and laboratory) are listed below for reference.    Significant Diagnostic Studies: Dg Chest 2 View  Result Date: 09/07/2017 CLINICAL DATA:  Chest pain last night for 2 hours that resolve. No associated symptoms. History of hypertension, obstructive cardiomyopathy, Panorex is mole atrial fibrillation. EXAM: CHEST  2 VIEW COMPARISON:  Chest x-ray of November 28, 2014 FINDINGS: The lungs are adequately inflated. There is no focal infiltrate. There is linear increased density in the retrosternal region on the lateral view which is new and likely lies on the left in the lingula. The heart is top-normal in size. The pulmonary vascularity is normal. There is tortuosity of the descending thoracic aorta. There is calcification in the wall of the aortic arch. There is mild multilevel degenerative disc disease. IMPRESSION: Mild chronic bronchitic changes, stable. Probable lingular subsegmental atelectasis. Stable cardiomegaly without pulmonary edema. Thoracic aortic atherosclerosis. Electronically Signed   By: David  Martinique M.D.   On: 09/07/2017 11:56   Nm Myocar Multi W/spect W/wall Motion / Ef  Result Date: 09/08/2017  There was no ST segment deviation noted during stress.  No T wave inversion was noted during stress.  Defect 1: There is a small defect of mild severity present in the apical lateral location.  This is a low risk study.   Nuclear stress EF: 64%.  Low risk stress nuclear study with normal perfusion and normal left ventricular regional and global systolic function.    Microbiology: No results found for this or any previous visit (from the past 240 hour(s)).   Labs: Basic Metabolic Panel: Recent Labs  Lab 09/07/17 1107 09/08/17 0816 09/09/17 0543  NA 139  140 141  K 3.8 3.6 3.4*  CL 105 106 109  CO2 27 27 26   GLUCOSE 266* 156* 164*  BUN 15 14 15   CREATININE 1.39* 1.18* 1.20*  CALCIUM 9.3 9.2 9.2  MG  --   --  1.8   Liver Function Tests: No results for input(s): AST, ALT, ALKPHOS, BILITOT, PROT, ALBUMIN in the last 168 hours. No results for input(s): LIPASE, AMYLASE in the last 168 hours. No results for input(s): AMMONIA in the last 168 hours. CBC: Recent Labs  Lab 09/07/17 1107 09/09/17 0543  WBC 8.0 9.7  HGB 13.8 13.3  HCT 41.3 39.9  MCV 92.6 92.8  PLT 195 198   Cardiac Enzymes: Recent Labs  Lab 09/07/17 1615 09/07/17 1843 09/07/17 2111 09/08/17 0023  TROPONINI <0.03 <0.03 <0.03 0.03*   BNP: BNP (last 3 results) Recent Labs    09/07/17 1107  BNP 122.5*    ProBNP (last 3 results) Recent Labs    07/03/17 1112  PROBNP 183    CBG: No results for input(s): GLUCAP in the last 168 hours.     Signed:  Fayrene Helper MD.  [facility] 09/09/2017, 2:58 PM

## 2017-09-09 NOTE — Progress Notes (Signed)
Progress Note  Patient Name: Monique [name] Date of Encounter: 09/09/2017  Primary Cardiologist: Dr. Harrington Challenger Patient Profile     82 y.o. female with a hx of HOCM (initial dx 2009) last evaluated 11/2014 per echo which revealed LVEF 65-70% with dynamic obstruction and severe LVH and Grade I diastolic dysfunction, PAF on Xarelto, bilateral PE (11/2014 while on therapeutic coumadin), DVT(06/2014), CKD stage III, HTN  and HLD   Seen for  the evaluation of chest pain at the request of Dr. Ellender Hose.    Myoview low risk 1/19  Ech still p   Subjective  Without complaints.  Currently undergoing echo No further chest pain    Inpatient Medications    Scheduled Meds: . amLODipine  5 mg Oral BID  . atorvastatin  20 mg Oral q1800  . disopyramide  150 mg Oral BID  . metoprolol tartrate  25 mg Oral BID  . mirtazapine  15 mg Oral QHS  . montelukast  10 mg Oral QHS  . pantoprazole  40 mg Oral Daily  . regadenoson  0.4 mg Intravenous Once  . Rivaroxaban  15 mg Oral Q supper  . sertraline  50 mg Oral Daily   Continuous Infusions:  PRN Meds: acetaminophen, nitroGLYCERIN, ondansetron (ZOFRAN) IV   Vital Signs    Vitals:   09/08/17 1340 09/08/17 1524 09/08/17 2054 09/09/17 0429  BP: (!) 152/76 (!) 150/70 136/81 131/69  Pulse: 76 75 63 62  Resp:  18 18 18   Temp:  98.2 F (36.8 C) 97.7 F (36.5 C) 98.1 F (36.7 C)  TempSrc:  Oral Oral Oral  SpO2:  98% 95% 94%  Weight:      Height:        Intake/Output Summary (Last 24 hours) at 09/09/2017 0840 Last data filed at 09/09/2017 0600 Gross per 24 hour  Intake 240 ml  Output 0 ml  Net 240 ml   Filed Weights   09/07/17 1101 09/07/17 1829  Weight: 165 lb (74.8 kg) 171 lb 8 oz (77.8 kg)    Physical Exam   Well developed and nourished in no acute distress HENT normal Neck supple  Carotids brisk and full without bruits Clear Regular rate and rhythm, 2/6 m Abd-soft with active BS without hepatomegaly No Clubbing cyanosis  edema Skin-warm and dry A & Oriented  Grossly normal sensory and motor function    Labs    Chemistry Recent Labs  Lab 09/07/17 1107 09/08/17 0816 09/09/17 0543  NA 139 140 141  K 3.8 3.6 3.4*  CL 105 106 109  CO2 27 27 26   GLUCOSE 266* 156* 164*  BUN 15 14 15   CREATININE 1.39* 1.18* 1.20*  CALCIUM 9.3 9.2 9.2  GFRNONAA 35* 43* 42*  GFRAA 41* 49* 48*  ANIONGAP 7 7 6      Hematology Recent Labs  Lab 09/07/17 1107 09/09/17 0543  WBC 8.0 9.7  RBC 4.46 4.30  HGB 13.8 13.3  HCT 41.3 39.9  MCV 92.6 92.8  MCH 30.9 30.9  MCHC 33.4 33.3  RDW 13.6 13.9  PLT 195 198    Cardiac Enzymes Recent Labs  Lab 09/07/17 1615 09/07/17 1843 09/07/17 2111 09/08/17 0023  TROPONINI <0.03 <0.03 <0.03 0.03*    Recent Labs  Lab 09/07/17 1133  TROPIPOC 0.00     BNP Recent Labs  Lab 09/07/17 1107  BNP 122.5*     DDimer No results for input(s): DDIMER in the last 168 hours.   Radiology  Dg Chest 2 View  Result Date: 09/07/2017 CLINICAL DATA:  Chest pain last night for 2 hours that resolve. No associated symptoms. History of hypertension, obstructive cardiomyopathy, Panorex is mole atrial fibrillation. EXAM: CHEST  2 VIEW COMPARISON:  Chest x-ray of November 28, 2014 FINDINGS: The lungs are adequately inflated. There is no focal infiltrate. There is linear increased density in the retrosternal region on the lateral view which is new and likely lies on the left in the lingula. The heart is top-normal in size. The pulmonary vascularity is normal. There is tortuosity of the descending thoracic aorta. There is calcification in the wall of the aortic arch. There is mild multilevel degenerative disc disease. IMPRESSION: Mild chronic bronchitic changes, stable. Probable lingular subsegmental atelectasis. Stable cardiomegaly without pulmonary edema. Thoracic aortic atherosclerosis. Electronically Signed   By: David  Martinique M.D.   On: 09/07/2017 11:56   Nm Myocar Multi W/spect W/wall Motion  / Ef  Result Date: 09/08/2017  There was no ST segment deviation noted during stress.  No T wave inversion was noted during stress.  Defect 1: There is a small defect of mild severity present in the apical lateral location.  This is a low risk study.  Nuclear stress EF: 64%.  Low risk stress nuclear study with normal perfusion and normal left ventricular regional and global systolic function.    Telemetry    09/08/17 NSR 62, 0406 am pt had 6-9 beat run of VT- Personally Reviewed  ECG    09/07/17-SR 72  - Personally Reviewed  Cardiac Studies   ECHO: 11/24/14 Study Conclusions  - Left ventricle: The cavity size was normal. Wall thickness was increased in a pattern of severe LVH. Systolic function was vigorous. The estimated ejection fraction was in the range of 65% to 70%. There was dynamic obstruction. Wall motion was normal; there were no regional wall motion abnormalities. Doppler parameters are consistent with abnormal left ventricular relaxation (grade 1 diastolic dysfunction). - Aortic valve: Valve area (VTI): 2.5 cm^2. Valve area (Vmax): 2.62 cm^2. - Mitral valve: There was mild regurgitation.  Transthoracic echocardiography. M-mode, complete 2D, spectral Doppler, and color Doppler. Birthdate: Patient birthdate: 1936/08/06. Age: Patient is 82 yr old. Sex: Gender: female. BMI: 24 kg/m^2. Blood pressure:   140/6 Patient status: Inpatient. Study date: Study date: 11/24/2014. Study time: 12:59 PM. Location: Bedside.  Myoview stress 11/2014: Normal   CATH: NA    Assessment & Plan    1. Chest pain:   2. CKD Stage III:   3. Hx of HOCM:  4. Paroxysmal Afib:   5. HTN:  6. PE   Cont Rivaroxaban dosed appropriately  Neg myoview and troponin  Continue metop and disopyramide for HOCM  Continue amlodipine and Metop for BP   As outpt might benefit to try verap/dilt vs amlodipine given HR in 70s   My really estimate the right  side of the heart was large by echo.  In the context of her PE perhaps not unsurprising.  Anticoagulation appropriate.  Will sign off.  Call for further questions Signed, Virl Axe, MD  09/09/2017, 8:40 AM

## 2017-09-09 NOTE — Progress Notes (Signed)
  Echocardiogram 2D Echocardiogram has been performed.  Monique [name] M 09/09/2017, 9:24 AM

## 2017-09-11 ENCOUNTER — Telehealth: Payer: Self-pay | Admitting: *Deleted

## 2017-09-11 NOTE — Telephone Encounter (Signed)
Called pt to make hosp follow-up appt w/Dr. Sharlet Salina there was no answer LMOM RTC...Monique [name]

## 2017-09-12 NOTE — Telephone Encounter (Signed)
Called pt again still no answer LMOM to call and make hosp f/u w/Dr. Sharlet Salina. Per pt chart she has already made hosp f/u q/cardiology for 09/14/17...Johny Chess

## 2017-09-14 ENCOUNTER — Encounter: Payer: Self-pay | Admitting: Internal Medicine

## 2017-09-14 ENCOUNTER — Ambulatory Visit: Payer: Medicare Other | Admitting: Internal Medicine

## 2017-09-14 VITALS — BP 160/90 | HR 62 | Ht 66.0 in | Wt 172.4 lb

## 2017-09-14 DIAGNOSIS — R072 Precordial pain: Secondary | ICD-10-CM | POA: Diagnosis not present

## 2017-09-14 DIAGNOSIS — I10 Essential (primary) hypertension: Secondary | ICD-10-CM | POA: Diagnosis not present

## 2017-09-14 DIAGNOSIS — I480 Paroxysmal atrial fibrillation: Secondary | ICD-10-CM

## 2017-09-14 DIAGNOSIS — I421 Obstructive hypertrophic cardiomyopathy: Secondary | ICD-10-CM

## 2017-09-14 DIAGNOSIS — I671 Cerebral aneurysm, nonruptured: Secondary | ICD-10-CM | POA: Diagnosis not present

## 2017-09-14 DIAGNOSIS — N183 Chronic kidney disease, stage 3 unspecified: Secondary | ICD-10-CM

## 2017-09-14 DIAGNOSIS — E785 Hyperlipidemia, unspecified: Secondary | ICD-10-CM | POA: Diagnosis not present

## 2017-09-14 NOTE — Progress Notes (Signed)
Cardiology Office Note    Date:  09/14/2017   ID:  Monique, [name] [birth year], MRN [id number]  PCP:  Hoyt Koch, MD  Cardiologist: Dr. Harrington Challenger  Chief Complaint: Follow up for HOCM & Afib  History of Present Illness:   Monique [name] is a 82 y.o. female with hx of HOCM, HTN, PAF, bilateral PE, DVT, CKD stage III and HLD presents for follow up.   Last echo 11/2014 showed LVEF of 65-70%, dynamic obstruction, severe LVH,  grade 1 DD, mild MR. Low risk stress test 11/2014.  I saw her in [place]y 2018  She was seen by B Bhagat inOctober  She was admitted to [facility] on Jan 3 with chest discomfort Pain was intermitt, dull pressure  Substernal  Ona and off  New Pt underwent stress testing  Low risk  No signif ischemia   Simvistatin stopped and lipitor started   Since discharge she denies CP  Breathing is OK   Has a L sided posterior HA  Past Medical History:  Diagnosis Date  . Abdominal pain 11/18/2015  . Acute respiratory failure with hypoxia ([place]) 06/27/2014  . Breast pain, left 10/11/2016  . Cerebral aneurysm 2000 06/27/2014  . Chest pain 10/30/2014  . CHF (congestive heart failure) ([place])   . Chronic anticoagulation 06/2014   Coumadin started after bilateral PE  . Chronic kidney disease, stage III (moderate) ([place]) 11/21/2014  . Clotting disorder ([place])    right was removed, still have left cataract  . Dizziness and giddiness 11/28/2014  . DVT of lower extremity (deep venous thrombosis) ([place]) 11/28/2014  . Dysuria 07/07/2014  . Elevated troponin 11/28/2014  . Fatty liver   . Gastritis 05/16/2012   Chronic dyspepsia on PPI daily.   Marland Kitchen Headache, acute 06/08/2010   Qualifier: Diagnosis of  By: Linda Hedges MD, Heinz Knuckles   . Heart murmur   . Hematochezia 11/18/2015  . History of fibromuscular dysplasia 06/27/2014  . HTN (hypertension)    Essential  . Hyperlipidemia   . Hypertrophic obstructive cardiomyopathy (HOCM) ([place]) 05/20/2008   Qualifier: Diagnosis of  By: Linda Hedges MD,  Heinz Knuckles   . Kidney stones   . LVH (left ventricular hypertrophy)   . Orthostatic hypotension 11/28/2014  . PAF (paroxysmal atrial fibrillation) ([place]) 2000  . Palpitations 11/28/2014  . PAROXYSMAL ATRIAL FIBRILLATION 05/20/2008   Qualifier: Diagnosis of  By: Linda Hedges MD, Heinz Knuckles   . PE (pulmonary embolism) 06/2014   Bilateral  . Pulmonary embolism, bilateral ([place]) 06/27/2014  . SOB (shortness of breath) 06/27/2014  . Subarachnoid hemorrhage ([place]) 2000  . UTI (urinary tract infection) 01/04/2015    Past Surgical History:  Procedure Laterality Date  . ABDOMINAL HYSTERECTOMY  2000  . CARDIAC CATHETERIZATION  05/2008   Nl cors, no gradient, EF 60%    Current Medications: Prior to Admission medications   Medication Sig Start Date End Date Taking? Authorizing Provider  amLODipine (NORVASC) 5 MG tablet Take 1 tablet (5 mg total) by mouth 2 (two) times daily. 12/02/16   Fay Records, MD  disopyramide (NORPACE) 150 MG capsule Take 1 capsule (150 mg total) by mouth 2 (two) times daily. 01/13/17   Fay Records, MD  metoprolol tartrate (LOPRESSOR) 25 MG tablet Take 1 tablet (25 mg total) by mouth 2 (two) times daily. 12/06/16   Fay Records, MD  mirtazapine (REMERON) 15 MG tablet TAKE 1 TABLET (15 MG TOTAL) BY MOUTH AT BEDTIME. 06/20/17   Hoyt Koch, MD  montelukast (SINGULAIR) 10 MG tablet Take 1 tablet (10 mg total) by mouth at bedtime. 06/20/17   Hoyt Koch, MD  nitroGLYCERIN (NITROSTAT) 0.4 MG SL tablet Place 1 tablet (0.4 mg total) under the tongue every 5 (five) minutes as needed for chest pain. 03/30/15   Fay Records, MD  pantoprazole (PROTONIX) 40 MG tablet Take 1 tablet (40 mg total) by mouth daily. Need annual visit of further refills 06/07/17   Hoyt Koch, MD  Rivaroxaban (XARELTO) 15 MG TABS tablet Take 1 tablet (15 mg total) by mouth daily with supper. 01/17/17   Fay Records, MD  sertraline (ZOLOFT) 50 MG tablet TAKE 1 TABLET (50 MG TOTAL) BY MOUTH  DAILY. 02/24/15   Hoyt Koch, MD  simvastatin (ZOCOR) 40 MG tablet TAKE 1 TABLET (40 MG TOTAL) BY MOUTH AT BEDTIME. 01/13/17   Fay Records, MD    Allergies:   Patient has no known allergies.   Social History   Socioeconomic History  . Marital status: Divorced    Spouse name: None  . Number of children: 3  . Years of education: 54  . Highest education level: None  Social Needs  . Financial resource strain: None  . Food insecurity - worry: None  . Food insecurity - inability: None  . Transportation needs - medical: None  . Transportation needs - non-medical: None  Occupational History  . Occupation: Retired [employer]  . Smoking status: Former Smoker    Last attempt to quit: 09/06/1967    Years since quitting: 50.0  . Smokeless tobacco: Never Used  Substance and Sexual Activity  . Alcohol use: No    Alcohol/week: 0.0 oz  . Drug use: No  . Sexual activity: Not Currently  Other Topics Concern  . None  Social History Narrative   HSG. Married '61 - '88/divorced. 3 sons - '74, '59, '62, 1 daughter '63; 5 grandchildren. Lives alone in two story home. She does not drive.   Retired from [employer].     Family History:  The patient's family history includes Arrhythmia in her mother and sister; Breast cancer in her daughter and mother; Heart disease in her mother; Stroke in her mother.   ROS:   Please see the history of present illness.    ROS All other systems reviewed and are negative.   PHYSICAL EXAM:   VS:  BP (!) 160/90   Pulse 62   Ht 5\' 6"  (1.676 m)   Wt 172 lb 6.4 oz (78.2 kg)   SpO2 93%   BMI 27.83 kg/m    GEN: Well nourished, well developed, in no acute distress  HEENT: normal  Neck: no JVD, carotid bruits, or masses Cardiac: RRR; no signif murmurs, rubs, or gallops, Trace to 1 + LL edema  Respiratory:  clear to auscultation bilaterally, normal work of breathing GI: soft, nontender, nondistended, + BS MS: no  deformity or atrophy  Skin: warm and dry, no rash Neuro:  Alert and Oriented x 3, Strength and sensation are intact Psych: euthymic mood, full affect  Wt Readings from Last 3 Encounters:  09/14/17 172 lb 6.4 oz (78.2 kg)  09/07/17 171 lb 8 oz (77.8 kg)  07/03/17 176 lb 12.8 oz (80.2 kg)      Studies/Labs Reviewed:   EKG:  EKG is not ordered today.    Recent Labs: 07/03/2017: NT-Pro BNP 183 09/07/2017: B Natriuretic Peptide 122.5 09/08/2017: TSH 2.829 09/09/2017: BUN 15; Creatinine, Ser  1.20; Hemoglobin 13.3; Magnesium 1.8; Platelets 198; Potassium 3.4; Sodium 141   Lipid Panel    Component Value Date/Time   CHOL 141 09/08/2017 0816   CHOL 140 11/11/2016 0900   TRIG 113 09/08/2017 0816   HDL 52 09/08/2017 0816   HDL 51 11/11/2016 0900   CHOLHDL 2.7 09/08/2017 0816   VLDL 23 09/08/2017 0816   LDLCALC 66 09/08/2017 0816   LDLCALC 65 11/11/2016 0900    Additional studies/ records that were reviewed today include:   Echocardiogram: 11/24/14 Study Conclusions  - Left ventricle: The cavity size was normal. Wall thickness was increased in a pattern of severe LVH. Systolic function was vigorous. The estimated ejection fraction was in the range of 65% to 70%. There was dynamic obstruction. Wall motion was normal; there were no regional wall motion abnormalities. Doppler parameters are consistent with abnormal left ventricular relaxation (grade 1 diastolic dysfunction). - Aortic valve: Valve area (VTI): 2.5 cm^2. Valve area (Vmax): 2.62 cm^2. - Mitral valve: There was mild regurgitation.    ASSESSMENT & PLAN:   1  Chest pain  Currently without symptoms  ? GI in origin  Shw says she ate some diffiferent foods on New Years    Follow   2  PAF . Denies palpitations, SOB or dizziness. Continue metoprolol and Xarelto 15mg  qd.   2. HTN - relatively is fair  .   3  HA   Will set up for MRA of brain given history .   4. HOCM Exam does not sugg signif obstruction   Follow    5.  Left lower extremity edema  Improved  7. HLD - 09/08/2017: Cholesterol 141; HDL 52; LDL Cholesterol 66; Triglycerides 113; VLDL 23  -Continue statin.  8. CKD stage III - As above  Medication Adjustments/Labs and Tests Ordered: Current medicines are reviewed at length with the patient today.  Concerns regarding medicines are outlined above.  Medication changes, Labs and Tests ordered today are listed in the Patient Instructions below. There are no Patient Instructions on file for this visit.   Signed, Dorris Carnes, MD  09/14/2017 4:57 PM    [address], [place], [place]  [address] Phone: [phone]; Fax: [phone]

## 2017-09-14 NOTE — Patient Instructions (Signed)
Your physician recommends that you continue on your current medications as directed. Please refer to the Current Medication list given to you today.  Please schedule MRA of head for headache/head pain.

## 2017-09-15 ENCOUNTER — Ambulatory Visit (HOSPITAL_COMMUNITY)
Admission: RE | Admit: 2017-09-15 | Discharge: 2017-09-15 | Disposition: A | Discharge status: Home / Self Care | Payer: Medicare Other | Source: Ambulatory Visit | Attending: Internal Medicine | Admitting: Internal Medicine

## 2017-09-15 DIAGNOSIS — I6782 Cerebral ischemia: Secondary | ICD-10-CM | POA: Diagnosis not present

## 2017-09-15 DIAGNOSIS — Z8673 Personal history of transient ischemic attack (TIA), and cerebral infarction without residual deficits: Secondary | ICD-10-CM | POA: Diagnosis not present

## 2017-09-15 DIAGNOSIS — I671 Cerebral aneurysm, nonruptured: Secondary | ICD-10-CM

## 2017-09-15 DIAGNOSIS — R51 Headache: Secondary | ICD-10-CM | POA: Diagnosis not present

## 2017-09-19 ENCOUNTER — Encounter: Payer: Self-pay | Admitting: Internal Medicine

## 2017-09-19 ENCOUNTER — Ambulatory Visit (INDEPENDENT_AMBULATORY_CARE_PROVIDER_SITE_OTHER): Payer: Medicare Other | Admitting: Internal Medicine

## 2017-09-19 VITALS — BP 140/90 | HR 61 | Temp 97.6°F | Ht 66.0 in | Wt 172.0 lb

## 2017-09-19 DIAGNOSIS — R072 Precordial pain: Secondary | ICD-10-CM

## 2017-09-19 DIAGNOSIS — E119 Type 2 diabetes mellitus without complications: Secondary | ICD-10-CM

## 2017-09-19 DIAGNOSIS — I10 Essential (primary) hypertension: Secondary | ICD-10-CM | POA: Diagnosis not present

## 2017-09-19 NOTE — Patient Instructions (Signed)
Diabetes Mellitus and Standards of Medical Care Managing diabetes (diabetes mellitus) can be complicated. Your diabetes treatment may be managed by a team of health care providers, including:  A diet and nutrition specialist (registered dietitian).  A nurse.  A certified diabetes educator (CDE).  A diabetes specialist (endocrinologist).  An eye doctor.  A primary care provider.  A dentist.  Your health care providers follow a schedule in order to help you get the best quality of care. The following schedule is a general guideline for your diabetes management plan. Your health care providers may also give you more specific instructions. HbA1c ( hemoglobin A1c) test This test provides information about blood sugar (glucose) control over the previous 2-3 months. It is used to check whether your diabetes management plan needs to be adjusted.  If you are meeting your treatment goals, this test is done at least 2 times a year.  If you are not meeting treatment goals or if your treatment goals have changed, this test is done 4 times a year.  Blood pressure test  This test is done at every routine medical visit. For most people, the goal is less than 130/80. Ask your health care provider what your goal blood pressure should be. Dental and eye exams  Visit your dentist two times a year.  If you have type 1 diabetes, get an eye exam 3-5 years after you are diagnosed, and then once a year after your first exam. ? If you were diagnosed with type 1 diabetes as a child, get an eye exam when you are age 16 or older and have had diabetes for 3-5 years. After the first exam, you should get an eye exam once a year.  If you have type 2 diabetes, have an eye exam as soon as you are diagnosed, and then once a year after your first exam. Foot care exam  Visual foot exams are done at every routine medical visit. The exams check for cuts, bruises, redness, blisters, sores, or other problems with the  feet.  A complete foot exam is done by your health care provider once a year. This exam includes an inspection of the structure and skin of your feet, and a check of the pulses and sensation in your feet. ? Type 1 diabetes: Get your first exam 3-5 years after diagnosis. ? Type 2 diabetes: Get your first exam as soon as you are diagnosed.  Check your feet every day for cuts, bruises, redness, blisters, or sores. If you have any of these or other problems that are not healing, contact your health care provider. Kidney function test ( urine microalbumin)  This test is done once a year. ? Type 1 diabetes: Get your first test 5 years after diagnosis. ? Type 2 diabetes: Get your first test as soon as you are diagnosed.  If you have chronic kidney disease (CKD), get a serum creatinine and estimated glomerular filtration rate (eGFR) test once a year. Lipid profile (cholesterol, HDL, LDL, triglycerides)  This test should be done when you are diagnosed with diabetes, and every 5 years after the first test. If you are on medicines to lower your cholesterol, you may need to get this test done every year. ? The goal for LDL is less than 100 mg/dL (5.5 mmol/L). If you are at high risk, the goal is less than 70 mg/dL (3.9 mmol/L). ? The goal for HDL is 40 mg/dL (2.2 mmol/L) for men and 50 mg/dL(2.8 mmol/L) for women. An HDL  cholesterol of 60 mg/dL (3.3 mmol/L) or higher gives some protection against heart disease. ? The goal for triglycerides is less than 150 mg/dL (8.3 mmol/L). Immunizations  The yearly flu (influenza) vaccine is recommended for everyone 6 months or older who has diabetes.  The pneumonia (pneumococcal) vaccine is recommended for everyone 2 years or older who has diabetes. If you are 5 or older, you may get the pneumonia vaccine as a series of two separate shots.  The hepatitis B vaccine is recommended for adults shortly after they have been diagnosed with diabetes.  The Tdap  (tetanus, diphtheria, and pertussis) vaccine should be given: ? According to normal childhood vaccination schedules, for children. ? Every 10 years, for adults who have diabetes.  The shingles vaccine is recommended for people who have had chicken pox and are 50 years or older. Mental and emotional health  Screening for symptoms of eating disorders, anxiety, and depression is recommended at the time of diagnosis and afterward as needed. If your screening shows that you have symptoms (you have a positive screening result), you may need further evaluation and be referred to a mental health care provider. Diabetes self-management education  Education about how to manage your diabetes is recommended at diagnosis and ongoing as needed. Treatment plan  Your treatment plan will be reviewed at every medical visit. Summary  Managing diabetes (diabetes mellitus) can be complicated. Your diabetes treatment may be managed by a team of health care providers.  Your health care providers follow a schedule in order to help you get the best quality of care.  Standards of care including having regular physical exams, blood tests, blood pressure monitoring, immunizations, screening tests, and education about how to manage your diabetes.  Your health care providers may also give you more specific instructions based on your individual health. This information is not intended to replace advice given to you by your health care provider. Make sure you discuss any questions you have with your health care provider. Document Released: 06/19/2009 Document Revised: 05/20/2016 Document Reviewed: 05/20/2016 Elsevier Interactive Patient Education  [employer].

## 2017-09-19 NOTE — Progress Notes (Signed)
   Subjective:    Patient ID: Monique [name], female    DOB: Jan 29, 1936, 82 y.o.   MRN: [id number]  HPI  The patient is a 82 YO female coming in for hospital follow up (in with chest pains, given cardiac evaluation which was negative, told it was likely GI symptoms). She denies recurrence of the chest pains since leaving the hospital. Denies SOB or cough. She does have some gerd symptoms. Worse with eating a lot or certain foods. She denies diarrhea or constipation. She denies vomiting or nausea. She denies blood in stool. Activity and appetite is normal. Denies falls or balance problems. She did have newly diagnosed diabetes in the hospital and was started on metformin 500 mg daily. She denies having side effects from this medication.   PMH, [facility], social history reviewed and updated.  [place]  Review of Systems  Constitutional: Negative.   HENT: Negative.   Eyes: Negative.   Respiratory: Negative for cough, chest tightness and shortness of breath.   Cardiovascular: Negative for chest pain, palpitations and leg swelling.  Gastrointestinal: Negative for abdominal distention, abdominal pain, constipation, diarrhea, nausea and vomiting.  Musculoskeletal: Negative.   Skin: Negative.   Neurological: Negative.   Psychiatric/Behavioral: Negative.       Objective:   Physical Exam  Constitutional: She is oriented to person, place, and time. She appears well-developed and well-nourished.  HENT:  Head: Normocephalic and atraumatic.  Eyes: EOM are normal.  Neck: Normal range of motion.  Cardiovascular: Normal rate and regular rhythm.  Pulmonary/Chest: Effort normal and breath sounds normal. No respiratory distress. She has no wheezes. She has no rales.  Abdominal: Soft. Bowel sounds are normal. She exhibits no distension. There is no tenderness. There is no rebound.  Musculoskeletal: She exhibits no edema.  Neurological: She is alert and oriented to person, place, and time. Coordination  normal.  Skin: Skin is warm and dry.  Psychiatric: She has a normal mood and affect.   Vitals:   09/19/17 0931  BP: 140/90  Pulse: 61  Temp: 97.6 F (36.4 C)  TempSrc: Oral  SpO2: 99%  Weight: 172 lb (78 kg)  Height: 5\' 6"  (1.676 m)      Assessment & Plan:

## 2017-09-21 DIAGNOSIS — E118 Type 2 diabetes mellitus with unspecified complications: Secondary | ICD-10-CM | POA: Insufficient documentation

## 2017-09-21 DIAGNOSIS — E119 Type 2 diabetes mellitus without complications: Secondary | ICD-10-CM | POA: Insufficient documentation

## 2017-09-21 NOTE — Assessment & Plan Note (Signed)
Workup negative for cardiac etiology and she has not had recurrence.

## 2017-09-21 NOTE — Assessment & Plan Note (Signed)
Recently started on metformin for newly diagnosed diabetes. Foot exam done today without complications. Informed of the need for eye exam and to let them know about her new diabetes.

## 2017-09-21 NOTE — Assessment & Plan Note (Signed)
BP at goal on her amlodipine and metoprolol and disopyramide. Recent BMP without indication for change.

## 2017-10-05 ENCOUNTER — Other Ambulatory Visit: Payer: Self-pay | Admitting: Internal Medicine

## 2017-10-05 DIAGNOSIS — Z803 Family history of malignant neoplasm of breast: Secondary | ICD-10-CM | POA: Diagnosis not present

## 2017-10-05 DIAGNOSIS — Z1231 Encounter for screening mammogram for malignant neoplasm of breast: Secondary | ICD-10-CM | POA: Diagnosis not present

## 2017-10-06 ENCOUNTER — Encounter: Payer: Self-pay | Admitting: Internal Medicine

## 2017-10-06 NOTE — Progress Notes (Signed)
error 

## 2017-10-09 ENCOUNTER — Ambulatory Visit: Payer: Medicare Other | Admitting: Internal Medicine

## 2017-10-09 ENCOUNTER — Telehealth: Payer: Self-pay | Admitting: Internal Medicine

## 2017-10-09 DIAGNOSIS — R921 Mammographic calcification found on diagnostic imaging of breast: Secondary | ICD-10-CM | POA: Diagnosis not present

## 2017-10-09 LAB — HM MAMMOGRAPHY

## 2017-10-09 MED ORDER — ATORVASTATIN CALCIUM 20 MG PO TABS: 20.0000 mg | ORAL_TABLET | Freq: Every day | ORAL | 0 refills | Status: DC

## 2017-10-09 MED ORDER — METFORMIN HCL 500 MG PO TABS: 500.0000 mg | ORAL_TABLET | Freq: Every day | ORAL | 0 refills | Status: DC

## 2017-10-09 NOTE — Addendum Note (Signed)
Addended by: Cherly Hensen B on: 10/09/2017 10:04 AM   Modules accepted: Orders

## 2017-10-09 NOTE — Telephone Encounter (Signed)
Copied from [place] [id number]. Topic: Quick Communication - Rx Refill/Question >> Oct 09, 2017  9:31 AM Percell Belt A wrote: Medication: Atorvastatin and Metformin    Has the patient contacted their pharmacy? no   (Agent: If no, request that the patient contact the pharmacy for the refill.)   Preferred Pharmacy (with phone number or street name): [facility] [address] .  Best number to call pt [phone]   Agent: Please be advised that RX refills may take up to 3 business days. We ask that you follow-up with your pharmacy.

## 2017-10-10 ENCOUNTER — Encounter: Payer: Self-pay | Admitting: Internal Medicine

## 2017-10-10 NOTE — Progress Notes (Signed)
Abstracted and sent to scan  

## 2017-11-05 ENCOUNTER — Other Ambulatory Visit: Payer: Self-pay | Admitting: Internal Medicine

## 2017-11-21 DIAGNOSIS — E119 Type 2 diabetes mellitus without complications: Secondary | ICD-10-CM | POA: Diagnosis not present

## 2017-11-21 LAB — HM DIABETES EYE EXAM

## 2017-12-01 ENCOUNTER — Encounter: Payer: Self-pay | Admitting: Internal Medicine

## 2017-12-02 ENCOUNTER — Other Ambulatory Visit: Payer: Self-pay | Admitting: Internal Medicine

## 2017-12-07 ENCOUNTER — Other Ambulatory Visit: Payer: Self-pay | Admitting: Internal Medicine

## 2017-12-18 ENCOUNTER — Ambulatory Visit: Payer: Medicare Other | Admitting: Internal Medicine

## 2017-12-20 ENCOUNTER — Ambulatory Visit (INDEPENDENT_AMBULATORY_CARE_PROVIDER_SITE_OTHER): Payer: Medicare Other | Admitting: Internal Medicine

## 2017-12-20 ENCOUNTER — Encounter: Payer: Self-pay | Admitting: Internal Medicine

## 2017-12-20 ENCOUNTER — Other Ambulatory Visit (INDEPENDENT_AMBULATORY_CARE_PROVIDER_SITE_OTHER): Payer: Medicare Other

## 2017-12-20 VITALS — BP 118/90 | HR 53 | Temp 97.6°F | Ht 66.0 in | Wt 161.0 lb

## 2017-12-20 DIAGNOSIS — E119 Type 2 diabetes mellitus without complications: Secondary | ICD-10-CM | POA: Diagnosis not present

## 2017-12-20 LAB — HEMOGLOBIN A1C: Hgb A1c MFr Bld: 7.5 % — ABNORMAL HIGH (ref 4.6–6.5)

## 2017-12-20 MED ORDER — NITROFURANTOIN MONOHYD MACRO 100 MG PO CAPS: 100.0000 mg | ORAL_CAPSULE | Freq: Two times a day (BID) | ORAL | 0 refills | Status: DC

## 2017-12-20 NOTE — Progress Notes (Signed)
   Subjective:    Patient ID: Monique [name], female    DOB: 1936-07-05, 82 y.o.   MRN: [id number]  HPI The patient is a 82 YO female coming in for follow up of her diabetes. She has been taking metformin once a day and cut way back on sugars and carbs. She has lost about 10 pounds since last visit. Denies diarrhea but perhaps mild constipation with the metformin. Denies numbness or tingling in feet.   Review of Systems  Constitutional: Negative.   HENT: Negative.   Eyes: Negative.   Respiratory: Negative for cough, chest tightness and shortness of breath.   Cardiovascular: Negative for chest pain, palpitations and leg swelling.  Gastrointestinal: Negative for abdominal distention, abdominal pain, constipation, diarrhea, nausea and vomiting.  Musculoskeletal: Negative.   Skin: Negative.   Neurological: Negative.   Psychiatric/Behavioral: Negative.       Objective:   Physical Exam  Constitutional: She is oriented to person, place, and time. She appears well-developed and well-nourished.  HENT:  Head: Normocephalic and atraumatic.  Eyes: EOM are normal.  Neck: Normal range of motion.  Cardiovascular: Normal rate and regular rhythm.  Pulmonary/Chest: Effort normal and breath sounds normal. No respiratory distress. She has no wheezes. She has no rales.  Abdominal: Soft.  Musculoskeletal: She exhibits no edema.  Neurological: She is alert and oriented to person, place, and time. Coordination normal.  Skin: Skin is warm and dry.   Vitals:   12/20/17 1059  BP: 118/90  Pulse: (!) 53  Temp: 97.6 F (36.4 C)  TempSrc: Oral  SpO2: 96%  Weight: 161 lb (73 kg)  Height: 5\' 6"  (1.676 m)      Assessment & Plan:

## 2017-12-20 NOTE — Assessment & Plan Note (Signed)
Will check HgA1c today, adjust if needed. With 10 pound weight loss may need to stop metformin depending on results.

## 2017-12-20 NOTE — Patient Instructions (Signed)
We have sent in macrobid to take 1 pill twice a day for 1 week.   We are checking the sugars today and will call back with results.

## 2017-12-21 ENCOUNTER — Other Ambulatory Visit: Payer: Self-pay | Admitting: Internal Medicine

## 2018-01-04 ENCOUNTER — Ambulatory Visit (INDEPENDENT_AMBULATORY_CARE_PROVIDER_SITE_OTHER): Payer: Medicare Other | Admitting: Internal Medicine

## 2018-01-04 ENCOUNTER — Other Ambulatory Visit: Payer: Self-pay | Admitting: Internal Medicine

## 2018-01-04 ENCOUNTER — Other Ambulatory Visit (INDEPENDENT_AMBULATORY_CARE_PROVIDER_SITE_OTHER): Payer: Medicare Other

## 2018-01-04 ENCOUNTER — Encounter: Payer: Self-pay | Admitting: Internal Medicine

## 2018-01-04 VITALS — BP 118/78 | HR 63 | Temp 98.2°F | Ht 66.0 in | Wt 160.0 lb

## 2018-01-04 DIAGNOSIS — K921 Melena: Secondary | ICD-10-CM | POA: Insufficient documentation

## 2018-01-04 DIAGNOSIS — N3000 Acute cystitis without hematuria: Secondary | ICD-10-CM

## 2018-01-04 LAB — CBC
HEMATOCRIT: 40.3 % (ref 36.0–46.0)
Hemoglobin: 13.6 g/dL (ref 12.0–15.0)
MCHC: 33.7 g/dL (ref 30.0–36.0)
MCV: 93.4 fl (ref 78.0–100.0)
Platelets: 187 10*3/uL (ref 150.0–400.0)
RBC: 4.31 Mil/uL (ref 3.87–5.11)
RDW: 13.9 % (ref 11.5–15.5)
WBC: 9.9 10*3/uL (ref 4.0–10.5)

## 2018-01-04 MED ORDER — SULFAMETHOXAZOLE-TRIMETHOPRIM 800-160 MG PO TABS: 1.0000 | ORAL_TABLET | Freq: Two times a day (BID) | ORAL | 0 refills | Status: AC

## 2018-01-04 MED ORDER — HYDROCORTISONE 2.5 % RE CREA: 1.0000 "application " | TOPICAL_CREAM | Freq: Two times a day (BID) | RECTAL | 0 refills | Status: DC

## 2018-01-04 NOTE — Telephone Encounter (Signed)
Xarelto 15mg  refill request received; pt is 82 yrs old, 72.6kg, Crea-1.20 on 09/09/17, last seen by Dr. Harrington Challenger on 09/14/17, CrCl-43.52ml/min. Will send in refill to requested pharmacy.

## 2018-01-04 NOTE — Assessment & Plan Note (Signed)
Likely due to hemorrhoids. Talked about constipation avoidance. Rx for anusol and checking CBC given that she is on xarelto.

## 2018-01-04 NOTE — Patient Instructions (Signed)
We have sent in bactrim to take 1 pill twice a day for 5 days.  We have sent in the cream to use on the back side twice a day for 1-2 weeks for bleeding.

## 2018-01-04 NOTE — Progress Notes (Signed)
   Subjective:    Patient ID: Monique [name], female    DOB: 02-28-1936, 82 y.o.   MRN: [id number]  HPI The patient is a 82 YO female coming in for possible UTI. Started about 2-3 days ago. Some burning with urination. Having to go to the bathroom more often and having urgency. Denies trying anything for it. She has had uti in the past and felt similar. Denies nausea or vomiting. Still eating and drinking normally. Denies fevers or chills.  Also has a second new problem. She is having some episodes of blood in her stool in the last 2 weeks. She had been constipated and straining. Some blood in the stool and when she wiped. Now just some with wiping. The last episode was several days ago although she does feel something sticking out a little of her anus. Denies lightheadedness or dizziness. Last colonoscopy 2017 with hemorrhoids and 2 polyps. No further recommended for screening due to age.   Review of Systems  Constitutional: Negative.   HENT: Negative.   Eyes: Negative.   Respiratory: Negative.   Cardiovascular: Negative.   Gastrointestinal: Positive for abdominal pain, blood in stool and constipation. Negative for abdominal distention, diarrhea, nausea and vomiting.  Genitourinary: Positive for dysuria, frequency and urgency.  Musculoskeletal: Negative.   Skin: Negative.   Neurological: Negative.   Hematological: Bruises/bleeds easily.  Psychiatric/Behavioral: Negative.       Objective:   Physical Exam  Constitutional: She is oriented to person, place, and time. She appears well-developed and well-nourished.  HENT:  Head: Normocephalic and atraumatic.  Eyes: EOM are normal.  Neck: Normal range of motion.  Cardiovascular: Normal rate and regular rhythm.  Pulmonary/Chest: Effort normal and breath sounds normal. No respiratory distress. She has no wheezes. She has no rales.  Abdominal: Soft. Bowel sounds are normal. She exhibits no distension. There is tenderness. There is no rebound.   Suprapubic tenderness, +BS normal  Musculoskeletal: She exhibits no edema.  Neurological: She is alert and oriented to person, place, and time. Coordination normal.  Skin: Skin is warm and dry.  Psychiatric: She has a normal mood and affect.   Vitals:   01/04/18 1424  BP: 118/78  Pulse: 63  Temp: 98.2 F (36.8 C)  TempSrc: Oral  SpO2: 97%  Weight: 160 lb (72.6 kg)  Height: 5\' 6"  (1.676 m)      Assessment & Plan:

## 2018-01-04 NOTE — Assessment & Plan Note (Signed)
Suspected and recurrent. Rx for bactrim today. She was unable to leave a specimen today so POC testing was not done. If symptoms not alleviated will need to come for U/A and culture.

## 2018-01-10 ENCOUNTER — Other Ambulatory Visit: Payer: Self-pay | Admitting: Internal Medicine

## 2018-01-19 ENCOUNTER — Encounter: Payer: Self-pay | Admitting: Physician Assistant

## 2018-02-02 ENCOUNTER — Telehealth: Payer: Self-pay | Admitting: Emergency Medicine

## 2018-02-02 NOTE — Telephone Encounter (Signed)
Called patient to schedule AWV. Patient declined at this time. 

## 2018-02-09 ENCOUNTER — Other Ambulatory Visit: Payer: Self-pay | Admitting: Internal Medicine

## 2018-02-12 ENCOUNTER — Ambulatory Visit: Payer: Medicare Other | Admitting: Physician Assistant

## 2018-03-07 ENCOUNTER — Ambulatory Visit: Payer: Medicare Other | Admitting: Physician Assistant

## 2018-03-07 ENCOUNTER — Encounter (INDEPENDENT_AMBULATORY_CARE_PROVIDER_SITE_OTHER): Payer: Self-pay

## 2018-03-07 ENCOUNTER — Encounter: Payer: Self-pay | Admitting: Physician Assistant

## 2018-03-07 VITALS — BP 124/62 | HR 54 | Ht 66.0 in | Wt 156.0 lb

## 2018-03-07 DIAGNOSIS — I480 Paroxysmal atrial fibrillation: Secondary | ICD-10-CM

## 2018-03-07 DIAGNOSIS — R072 Precordial pain: Secondary | ICD-10-CM

## 2018-03-07 DIAGNOSIS — I421 Obstructive hypertrophic cardiomyopathy: Secondary | ICD-10-CM

## 2018-03-07 DIAGNOSIS — E785 Hyperlipidemia, unspecified: Secondary | ICD-10-CM | POA: Diagnosis not present

## 2018-03-07 DIAGNOSIS — I10 Essential (primary) hypertension: Secondary | ICD-10-CM

## 2018-03-07 DIAGNOSIS — R079 Chest pain, unspecified: Secondary | ICD-10-CM | POA: Diagnosis not present

## 2018-03-07 MED ORDER — NITROGLYCERIN 0.4 MG SL SUBL: 0.4000 mg | SUBLINGUAL_TABLET | SUBLINGUAL | 3 refills | Status: DC | PRN

## 2018-03-07 NOTE — Progress Notes (Signed)
Cardiology Office Note    Date:  03/07/2018   ID:  Monique [name], Monique [name] March 24, 1936, MRN [id number]  PCP:  Monique Koch, MD  Cardiologist:  Dr. Harrington Challenger  Chief Complaint: 6 Months follow up  History of Present Illness:   Monique [name] is a 82 y.o. female with hx of HOCM, HTN, PAF on Xarelto, bilateral PE (11/2014 while on therapeutic coumadin), DVT(06/2014), CKD stage III, HTN, DM  and HLD presents for follow up.   Admitted 09/2017 for chest pain. Lexiscan myoview was low risk. Echo showed LVEF of 65-70%, dynamic obstruction, moderate LVH, grade 1 DD. Technically difficult study.   She was doing well on cardiac stand point when seen by Dr. Harrington Challenger for hospital follow up.   Here today for 6 months follow up. She continues to have intermittent lower sternal "achy pressure" lasting for 30 minutes. Denies associated SOB, nausea, radiation, vomiting or diaphoresis. Some occurs after eating or laying, but no clear association. Has chronic ankle edema. Denies orthopnea, PND, syncope, dizziness or melena.    Past Medical History:  Diagnosis Date  . Abdominal pain 11/18/2015  . Acute respiratory failure with hypoxia ([place]) 06/27/2014  . Breast pain, left 10/11/2016  . Cerebral aneurysm 2000 06/27/2014  . Chest pain 10/30/2014  . CHF (congestive heart failure) ([place])   . Chronic anticoagulation 06/2014   Coumadin started after bilateral PE  . Chronic kidney disease, stage III (moderate) ([place]) 11/21/2014  . Clotting disorder ([place])    right was removed, still have left cataract  . Diabetes ([place])   . Dizziness and giddiness 11/28/2014  . DVT of lower extremity (deep venous thrombosis) ([place]) 11/28/2014  . Dysuria 07/07/2014  . Elevated troponin 11/28/2014  . Fatty liver   . Gastritis 05/16/2012   Chronic dyspepsia on PPI daily.   Marland Kitchen Headache, acute 06/08/2010   Qualifier: Diagnosis of  By: Linda Hedges MD, Heinz Knuckles   . Heart murmur   . Hematochezia 11/18/2015  . History of fibromuscular dysplasia  06/27/2014  . HTN (hypertension)    Essential  . Hyperlipidemia   . Hypertrophic obstructive cardiomyopathy (HOCM) ([place]) 05/20/2008   Qualifier: Diagnosis of  By: Linda Hedges MD, Heinz Knuckles   . Kidney stones   . LVH (left ventricular hypertrophy)   . Orthostatic hypotension 11/28/2014  . PAF (paroxysmal atrial fibrillation) ([place]) 2000  . Palpitations 11/28/2014  . PAROXYSMAL ATRIAL FIBRILLATION 05/20/2008   Qualifier: Diagnosis of  By: Linda Hedges MD, Heinz Knuckles   . PE (pulmonary embolism) 06/2014   Bilateral  . Pulmonary embolism, bilateral ([place]) 06/27/2014  . SOB (shortness of breath) 06/27/2014  . Subarachnoid hemorrhage ([place]) 2000  . UTI (urinary tract infection) 01/04/2015    Past Surgical History:  Procedure Laterality Date  . ABDOMINAL HYSTERECTOMY  2000  . CARDIAC CATHETERIZATION  05/2008   Nl cors, no gradient, EF 60%    Current Medications: Prior to Admission medications   Medication Sig Start Date End Date Taking? Authorizing Provider  acetaminophen (TYLENOL) 500 MG tablet Take 1,000 mg by mouth.    [provider]  amLODipine (NORVASC) 5 MG tablet Take 1 tablet (5 mg total) by mouth 2 (two) times daily. 12/21/17   Fay Records, MD  atorvastatin (LIPITOR) 20 MG tablet TAKE 1 TABLET (20 MG TOTAL) BY MOUTH DAILY AT 6 PM. 12/07/17 01/06/18  Monique Koch, MD  disopyramide (NORPACE) 150 MG capsule TAKE 1 CAPSULE (150 MG TOTAL) BY MOUTH 2 (TWO) TIMES DAILY. 01/10/18  Fay Records, MD  hydrocortisone (ANUSOL-HC) 2.5 % rectal cream Place 1 application rectally 2 (two) times daily. 01/04/18   Monique Koch, MD  metFORMIN (GLUCOPHAGE) 500 MG tablet TAKE 1 TABLET BY MOUTH EVERY DAY WITH BREAKFAST 12/07/17   Monique Koch, MD  metoprolol tartrate (LOPRESSOR) 25 MG tablet TAKE 1 TABLET (25 MG TOTAL) BY MOUTH 2 (TWO) TIMES DAILY. 12/04/17   Fay Records, MD  mirtazapine (REMERON) 15 MG tablet TAKE 1 TABLET (15 MG TOTAL) BY MOUTH AT BEDTIME. 06/20/17   Monique Koch,  MD  montelukast (SINGULAIR) 10 MG tablet Take 1 tablet (10 mg total) by mouth at bedtime. 06/20/17   Monique Koch, MD  nitroGLYCERIN (NITROSTAT) 0.4 MG SL tablet Place 1 tablet (0.4 mg total) under the tongue every 5 (five) minutes as needed for chest pain. 03/30/15   Fay Records, MD  pantoprazole (PROTONIX) 40 MG tablet TAKE 1 TABLET BY MOUTH EVERY DAY 02/09/18   Biagio Borg, MD  sertraline (ZOLOFT) 50 MG tablet TAKE 1 TABLET (50 MG TOTAL) BY MOUTH DAILY. 02/24/15   Monique Koch, MD  XARELTO 15 MG TABS tablet TAKE 1 TABLET BY MOUTH EVERY DAY WITH SUPPER 01/04/18   Fay Records, MD    Allergies:   Patient has no known allergies.   Social History   Socioeconomic History  . Marital status: Divorced    Spouse name: Not on file  . Number of children: 3  . Years of education: 33  . Highest education level: Not on file  Occupational History  . Occupation: Retired [employer]  . Financial resource strain: Not on file  . Food insecurity:    Worry: Not on file    Inability: Not on file  . Transportation needs:    Medical: Not on file    Non-medical: Not on file  Tobacco Use  . Smoking status: Former Smoker    Last attempt to quit: 09/06/1967    Years since quitting: 50.5  . Smokeless tobacco: Never Used  Substance and Sexual Activity  . Alcohol use: No    Alcohol/week: 0.0 oz  . Drug use: No  . Sexual activity: Not Currently  Lifestyle  . Physical activity:    Days per week: Not on file    Minutes per session: Not on file  . Stress: Not on file  Relationships  . Social connections:    Talks on phone: Not on file    Gets together: Not on file    Attends religious service: Not on file    Active member of club or organization: Not on file    Attends meetings of clubs or organizations: Not on file    Relationship status: Not on file  Other Topics Concern  . Not on file  Social History Narrative   HSG. Married '61 - '88/divorced. 3 sons - '74,  '59, '62, 1 daughter '63; 5 grandchildren. Lives alone in two story home. She does not drive.   Retired from [employer].     Family History:  The patient's family history includes Arrhythmia in her mother and sister; Breast cancer in her daughter and mother; Heart disease in her mother; Stroke in her mother.   ROS:   Please see the history of present illness.    ROS All other systems reviewed and are negative.   PHYSICAL EXAM:   VS:  BP 124/62   Pulse (!) 54   Ht  5\' 6"  (1.676 m)   Wt 156 lb (70.8 kg)   BMI 25.18 kg/m    GEN: Well nourished, well developed, in no acute distress  HEENT: normal  Neck: no JVD, carotid bruits, or masses Cardiac: RRR; no murmurs, rubs, or gallops, Trace bilateral ankle edema  Respiratory:  clear to auscultation bilaterally, normal work of breathing GI: soft, nontender, nondistended, + BS MS: no deformity or atrophy  Skin: warm and dry, no rash Neuro:  Alert and Oriented x 3, Strength and sensation are intact Psych: euthymic mood, full affect  Wt Readings from Last 3 Encounters:  03/07/18 156 lb (70.8 kg)  01/04/18 160 lb (72.6 kg)  12/20/17 161 lb (73 kg)      Studies/Labs Reviewed:   EKG:  EKG is ordered today.  The ekg ordered today demonstrates sinus bradycardia 54 bpm   Recent Labs: 07/03/2017: NT-Pro BNP 183 09/07/2017: B Natriuretic Peptide 122.5 09/08/2017: TSH 2.829 09/09/2017: BUN 15; Creatinine, Ser 1.20; Magnesium 1.8; Potassium 3.4; Sodium 141 01/04/2018: Hemoglobin 13.6; Platelets 187.0   Lipid Panel    Component Value Date/Time   CHOL 141 09/08/2017 0816   CHOL 140 11/11/2016 0900   TRIG 113 09/08/2017 0816   HDL 52 09/08/2017 0816   HDL 51 11/11/2016 0900   CHOLHDL 2.7 09/08/2017 0816   VLDL 23 09/08/2017 0816   LDLCALC 66 09/08/2017 0816   LDLCALC 65 11/11/2016 0900    Additional studies/ records that were reviewed today include:   Echo 09/2017 Study Conclusions  - Left ventricle: The cavity size  was normal. Wall thickness was   increased in a pattern of moderate LVH. Systolic function was   vigorous. The estimated ejection fraction was in the range of 65%   to 70%. There is an intracavitary gradient created by asymmetic   LVH, mild SAM, and hyperdnamic systolic function. Difficult   assessment of peak gradient from available tracings, peak   gradient 34 mmHg. No provocative maneuvers were performed.   Doppler parameters are consistent with abnormal left ventricular   relaxation (grade 1 diastolic dysfunction). - Aortic valve: Mildly calcified annulus. Mildly thickened   leaflets. Mean gradient (S): 21 mm Hg. Morphologically the valve   looks normal, the measured gradient appears to be subvalvular   from her dynamic intracaviatry gradient. Valve area (VTI): 1.55   cm^2. Valve area (Vmax): 2.05 cm^2. - Mitral valve: Mildly calcified annulus. Normal thickness leaflets   . - Technically difficult study.  Myoview 09/08/17  There was no ST segment deviation noted during stress.  No T wave inversion was noted during stress.  Defect 1: There is a small defect of mild severity present in the apical lateral location.  This is a low risk study.  Nuclear stress EF: 64%.   Low risk stress nuclear study with normal perfusion and normal left ventricular regional and global systolic function.   ASSESSMENT & PLAN:    1. PAF - Maintaining sinus rhythm.Continue Lopressor and Xarelto. No bleeding issue.   2. Hx of HOCM - no dizziness or syncope. Continue BB.   3. HTN - BP stable on current medications.   4. HLD - continue statin. Followed by PCP.   5. Chest pain  - Chronic. Seems atypical. Low risk stress test 09/2017>>. No change. Differential includes  musculoskeletal (reproducible with palpation) vs GERD (takes protonix but some what different but occasionally worse after food). EKG without acute changes. Trial of PRN nitro. Continue statin and BB. Advised to take BID dose  of  protonix for few weeks to see if it improves or not.    Medication Adjustments/Labs and Tests Ordered: Current medicines are reviewed at length with the patient today.  Concerns regarding medicines are outlined above.  Medication changes, Labs and Tests ordered today are listed in the Patient Instructions below. Patient Instructions  Medication Instructions:  Your physician has recommended you make the following change in your medication:  1-TAKE 1 Nitroglycerin (NTG) 0.4 mg, under your tongue, while sitting. If no relief of pain may repeat NTG, one tab every 5 minutes up to 3 tablets total over 15 minutes. If no relief CALL 911. If you have dizziness/lightheadness while taking NTG, stop taking and call 911.  Labwork: NONE  Testing/Procedures: NONE  Follow-Up: Your physician recommends that you schedule a follow-up appointment in: 3 months with Dr. Harrington Challenger.   If you need a refill on your cardiac medications before your next appointment, please call your pharmacy.      Jarrett Soho, [place]  03/07/2018 3:48 PM    [facility] [place], [place], [place]  [address] Phone: [phone]; Fax: [phone]

## 2018-03-07 NOTE — Patient Instructions (Signed)
Medication Instructions:  Your physician has recommended you make the following change in your medication:  1-TAKE 1 Nitroglycerin (NTG) 0.4 mg, under your tongue, while sitting. If no relief of pain may repeat NTG, one tab every 5 minutes up to 3 tablets total over 15 minutes. If no relief CALL 911. If you have dizziness/lightheadness while taking NTG, stop taking and call 911.  Labwork: NONE  Testing/Procedures: NONE  Follow-Up: Your physician recommends that you schedule a follow-up appointment in: 3 months with Dr. Harrington Challenger.   If you need a refill on your cardiac medications before your next appointment, please call your pharmacy.

## 2018-03-19 NOTE — Addendum Note (Signed)
Addended by: Marlis Edelson C on: 03/19/2018 11:42 AM   Modules accepted: Orders

## 2018-03-31 ENCOUNTER — Other Ambulatory Visit: Payer: Self-pay | Admitting: Internal Medicine

## 2018-04-05 ENCOUNTER — Other Ambulatory Visit: Payer: Self-pay | Admitting: Internal Medicine

## 2018-04-18 ENCOUNTER — Encounter: Payer: Self-pay | Admitting: Internal Medicine

## 2018-04-18 DIAGNOSIS — R921 Mammographic calcification found on diagnostic imaging of breast: Secondary | ICD-10-CM | POA: Diagnosis not present

## 2018-04-18 DIAGNOSIS — Z803 Family history of malignant neoplasm of breast: Secondary | ICD-10-CM | POA: Diagnosis not present

## 2018-04-18 LAB — HM MAMMOGRAPHY

## 2018-04-20 ENCOUNTER — Encounter: Payer: Self-pay | Admitting: Internal Medicine

## 2018-04-20 NOTE — Progress Notes (Signed)
Abstracted and sent to scan  

## 2018-05-05 ENCOUNTER — Other Ambulatory Visit: Payer: Self-pay | Admitting: Internal Medicine

## 2018-06-03 ENCOUNTER — Other Ambulatory Visit: Payer: Self-pay | Admitting: Internal Medicine

## 2018-06-04 ENCOUNTER — Other Ambulatory Visit: Payer: Self-pay | Admitting: Physician Assistant

## 2018-06-04 DIAGNOSIS — R079 Chest pain, unspecified: Secondary | ICD-10-CM

## 2018-06-08 ENCOUNTER — Encounter: Payer: Self-pay | Admitting: Internal Medicine

## 2018-06-08 ENCOUNTER — Ambulatory Visit: Payer: Medicare Other | Admitting: Internal Medicine

## 2018-06-08 VITALS — BP 130/80 | HR 57 | Ht 66.0 in | Wt 149.8 lb

## 2018-06-08 DIAGNOSIS — I10 Essential (primary) hypertension: Secondary | ICD-10-CM

## 2018-06-08 DIAGNOSIS — E119 Type 2 diabetes mellitus without complications: Secondary | ICD-10-CM | POA: Diagnosis not present

## 2018-06-08 DIAGNOSIS — E782 Mixed hyperlipidemia: Secondary | ICD-10-CM | POA: Diagnosis not present

## 2018-06-08 DIAGNOSIS — I421 Obstructive hypertrophic cardiomyopathy: Secondary | ICD-10-CM

## 2018-06-08 NOTE — Progress Notes (Addendum)
Cardiology Office Note    Date:  06/08/2018   ID:  [name], [name] 1936-02-19, MRN [id number]  PCP:  Monique Koch, MD  Cardiologist: Dr. Harrington Challenger  Chief Complaint: Follow up for HOCM & Afib  History of Present Illness:   Monique [name] is a 83 y.o. female with hx of HOCM, HTN, PAF, bilateral PE, DVT, CKD stage III and HLD presents for follow up.   Last echo 11/2014 showed LVEF of 65-70%, dynamic obstruction, severe LVH,  grade 1 DD, mild MR. Low risk stress test 11/2014.  Pt admitted in Jan 2019 with CP   Myovue showed no ischemia     She was last seen in clinic in July by B Bhagat  Since seen hse has done OK   Still has intermitt chest discomfort  Occurs at rest   Not with activity   Does have some refluix symtoms (bitter taste) even though on Protonix    The pt says her breathing is OK   No palpitations   No SOB     Past Medical History:  Diagnosis Date  . Abdominal pain 11/18/2015  . Acute respiratory failure with hypoxia ([place]) 06/27/2014  . Breast pain, left 10/11/2016  . Cerebral aneurysm 2000 06/27/2014  . Chest pain 10/30/2014  . CHF (congestive heart failure) ([place])   . Chronic anticoagulation 06/2014   Coumadin started after bilateral PE  . Chronic kidney disease, stage III (moderate) ([place]) 11/21/2014  . Clotting disorder ([place])    right was removed, still have left cataract  . Diabetes ([place])   . Dizziness and giddiness 11/28/2014  . DVT of lower extremity (deep venous thrombosis) ([place]) 11/28/2014  . Dysuria 07/07/2014  . Elevated troponin 11/28/2014  . Fatty liver   . Gastritis 05/16/2012   Chronic dyspepsia on PPI daily.   Marland Kitchen Headache, acute 06/08/2010   Qualifier: Diagnosis of  By: Linda Hedges MD, Heinz Knuckles   . Heart murmur   . Hematochezia 11/18/2015  . History of fibromuscular dysplasia 06/27/2014  . HTN (hypertension)    Essential  . Hyperlipidemia   . Hypertrophic obstructive cardiomyopathy (HOCM) ([place]) 05/20/2008   Qualifier: Diagnosis of  By: Linda Hedges  MD, Heinz Knuckles   . Kidney stones   . LVH (left ventricular hypertrophy)   . Orthostatic hypotension 11/28/2014  . PAF (paroxysmal atrial fibrillation) ([place]) 2000  . Palpitations 11/28/2014  . PAROXYSMAL ATRIAL FIBRILLATION 05/20/2008   Qualifier: Diagnosis of  By: Linda Hedges MD, Heinz Knuckles   . PE (pulmonary embolism) 06/2014   Bilateral  . Pulmonary embolism, bilateral ([place]) 06/27/2014  . SOB (shortness of breath) 06/27/2014  . Subarachnoid hemorrhage ([place]) 2000  . UTI (urinary tract infection) 01/04/2015    Past Surgical History:  Procedure Laterality Date  . ABDOMINAL HYSTERECTOMY  2000  . CARDIAC CATHETERIZATION  05/2008   Nl cors, no gradient, EF 60%    Current Medications: Prior to Admission medications   Medication Sig Start Date End Date Taking? Authorizing Provider  amLODipine (NORVASC) 5 MG tablet Take 1 tablet (5 mg total) by mouth 2 (two) times daily. 12/02/16   Fay Records, MD  disopyramide (NORPACE) 150 MG capsule Take 1 capsule (150 mg total) by mouth 2 (two) times daily. 01/13/17   Fay Records, MD  metoprolol tartrate (LOPRESSOR) 25 MG tablet Take 1 tablet (25 mg total) by mouth 2 (two) times daily. 12/06/16   Fay Records, MD  mirtazapine (REMERON) 15 MG tablet TAKE 1 TABLET (15  MG TOTAL) BY MOUTH AT BEDTIME. 06/20/17   Monique Koch, MD  montelukast (SINGULAIR) 10 MG tablet Take 1 tablet (10 mg total) by mouth at bedtime. 06/20/17   Monique Koch, MD  nitroGLYCERIN (NITROSTAT) 0.4 MG SL tablet Place 1 tablet (0.4 mg total) under the tongue every 5 (five) minutes as needed for chest pain. 03/30/15   Fay Records, MD  pantoprazole (PROTONIX) 40 MG tablet Take 1 tablet (40 mg total) by mouth daily. Need annual visit of further refills 06/07/17   Monique Koch, MD  Rivaroxaban (XARELTO) 15 MG TABS tablet Take 1 tablet (15 mg total) by mouth daily with supper. 01/17/17   Fay Records, MD  sertraline (ZOLOFT) 50 MG tablet TAKE 1 TABLET (50 MG TOTAL) BY MOUTH  DAILY. 02/24/15   Monique Koch, MD  simvastatin (ZOCOR) 40 MG tablet TAKE 1 TABLET (40 MG TOTAL) BY MOUTH AT BEDTIME. 01/13/17   Fay Records, MD    Allergies:   Patient has no known allergies.   Social History   Socioeconomic History  . Marital status: Divorced    Spouse name: Not on file  . Number of children: 3  . Years of education: 41  . Highest education level: Not on file  Occupational History  . Occupation: Retired [employer]  . Financial resource strain: Not on file  . Food insecurity:    Worry: Not on file    Inability: Not on file  . Transportation needs:    Medical: Not on file    Non-medical: Not on file  Tobacco Use  . Smoking status: Former Smoker    Last attempt to quit: 09/06/1967    Years since quitting: 50.7  . Smokeless tobacco: Never Used  Substance and Sexual Activity  . Alcohol use: No    Alcohol/week: 0.0 standard drinks  . Drug use: No  . Sexual activity: Not Currently  Lifestyle  . Physical activity:    Days per week: Not on file    Minutes per session: Not on file  . Stress: Not on file  Relationships  . Social connections:    Talks on phone: Not on file    Gets together: Not on file    Attends religious service: Not on file    Active member of club or organization: Not on file    Attends meetings of clubs or organizations: Not on file    Relationship status: Not on file  Other Topics Concern  . Not on file  Social History Narrative   HSG. Married '61 - '88/divorced. 3 sons - '74, '59, '62, 1 daughter '63; 5 grandchildren. Lives alone in two story home. She does not drive.   Retired from [employer].     Family History:  The patient's family history includes Arrhythmia in her mother and sister; Breast cancer in her daughter and mother; Heart disease in her mother; Stroke in her mother.   ROS:   Please see the history of present illness.    ROS All other systems reviewed and are  negative.   PHYSICAL EXAM:   VS:  BP 130/80   Pulse (!) 57   Ht 5\' 6"  (1.676 m)   Wt 149 lb 12.8 oz (67.9 kg)   SpO2 98%   BMI 24.18 kg/m    GEN: Well nourished, well developed, in no acute distress  HEENT: normal  Neck: JVP is normal  No, carotid bruits, or masses Cardiac:  RRR; no signif murmurs, rubs, or gallops, Trace to 1 + LL edema  Respiratory:  clear to auscultation bilaterally, normal work of breathing GI: soft, nontender, nondistended, + BS MS: no deformity or atrophy  Skin: warm and dry, no rash Neuro:  Alert and Oriented x 3, Strength and sensation are intact Psych: euthymic mood, full affect  Wt Readings from Last 3 Encounters:  06/08/18 149 lb 12.8 oz (67.9 kg)  03/07/18 156 lb (70.8 kg)  01/04/18 160 lb (72.6 kg)      Studies/Labs Reviewed:   EKG:  EKG is not ordered today.    Recent Labs: 07/03/2017: NT-Pro BNP 183 09/07/2017: B Natriuretic Peptide 122.5 09/08/2017: TSH 2.829 09/09/2017: BUN 15; Creatinine, Ser 1.20; Magnesium 1.8; Potassium 3.4; Sodium 141 01/04/2018: Hemoglobin 13.6; Platelets 187.0   Lipid Panel    Component Value Date/Time   CHOL 141 09/08/2017 0816   CHOL 140 11/11/2016 0900   TRIG 113 09/08/2017 0816   HDL 52 09/08/2017 0816   HDL 51 11/11/2016 0900   CHOLHDL 2.7 09/08/2017 0816   VLDL 23 09/08/2017 0816   LDLCALC 66 09/08/2017 0816   LDLCALC 65 11/11/2016 0900    Additional studies/ records that were reviewed today include:   Echocardiogram: 11/24/14 Study Conclusions  - Left ventricle: The cavity size was normal. Wall thickness was increased in a pattern of severe LVH. Systolic function was vigorous. The estimated ejection fraction was in the range of 65% to 70%. There was dynamic obstruction. Wall motion was normal; there were no regional wall motion abnormalities. Doppler parameters are consistent with abnormal left ventricular relaxation (grade 1 diastolic dysfunction). - Aortic valve: Valve area (VTI):  2.5 cm^2. Valve area (Vmax): 2.62 cm^2. - Mitral valve: There was mild regurgitation.    ASSESSMENT & PLAN:   1  Chest pain  Atypical   Episodes sound more GI in origin    Follow   2  PAF . Denies palpitations  Continue metoprolol and Xarelto 15mg  qd.   3. HTN -Continue meds .    4. HOCM Keep on current regimen   No murmur on exam to sugg outflow obstruction     5   PE  Continue on Xarelto   Check CBC  6. HLD - 09/08/2017: Cholesterol 141; HDL 52; LDL Cholesterol 66; Triglycerides 113; VLDL 23  -Continue statin.  7. CKD stage III -  Will check electrolytes today    Medication Adjustments/Labs and Tests Ordered: Current medicines are reviewed at length with the patient today.  Concerns regarding medicines are outlined above.  Medication changes, Labs and Tests ordered today are listed in the Patient Instructions below. There are no Patient Instructions on file for this visit.   Signed, Dorris Carnes, MD  06/08/2018 4:44 PM    [facility] [place], [place], [place]  [address] Phone: [phone]; Fax: [phone]

## 2018-06-08 NOTE — Patient Instructions (Signed)
Your physician recommends that you continue on your current medications as directed. Please refer to the Current Medication list given to you today. Your physician recommends that you return for lab work in: today (BMET, CBC, A1C)

## 2018-06-09 LAB — HEMOGLOBIN A1C
Est. average glucose Bld gHb Est-mCnc: 143 mg/dL
Hgb A1c MFr Bld: 6.6 % — ABNORMAL HIGH (ref 4.8–5.6)

## 2018-06-09 LAB — BASIC METABOLIC PANEL
BUN / CREAT RATIO: 20 (ref 12–28)
BUN: 26 mg/dL (ref 8–27)
CALCIUM: 9.7 mg/dL (ref 8.7–10.3)
CO2: 23 mmol/L (ref 20–29)
Chloride: 102 mmol/L (ref 96–106)
Creatinine, Ser: 1.32 mg/dL — ABNORMAL HIGH (ref 0.57–1.00)
GFR calc Af Amer: 44 mL/min/{1.73_m2} — ABNORMAL LOW (ref >59)
GFR, EST NON AFRICAN AMERICAN: 38 mL/min/{1.73_m2} — AB (ref >59)
Glucose: 76 mg/dL (ref 65–99)
POTASSIUM: 4.1 mmol/L (ref 3.5–5.2)
Sodium: 142 mmol/L (ref 134–144)

## 2018-06-09 LAB — CBC
Hematocrit: 38.1 % (ref 34.0–46.6)
Hemoglobin: 12.9 g/dL (ref 11.1–15.9)
MCH: 31.2 pg (ref 26.6–33.0)
MCHC: 33.9 g/dL (ref 31.5–35.7)
MCV: 92 fL (ref 79–97)
Platelets: 191 10*3/uL (ref 150–450)
RBC: 4.13 x10E6/uL (ref 3.77–5.28)
RDW: 12.9 % (ref 12.3–15.4)
WBC: 10.8 10*3/uL (ref 3.4–10.8)

## 2018-06-30 ENCOUNTER — Other Ambulatory Visit: Payer: Self-pay | Admitting: Internal Medicine

## 2018-07-04 ENCOUNTER — Other Ambulatory Visit: Payer: Self-pay | Admitting: Internal Medicine

## 2018-07-16 ENCOUNTER — Encounter: Payer: Self-pay | Admitting: Internal Medicine

## 2018-07-16 ENCOUNTER — Ambulatory Visit (INDEPENDENT_AMBULATORY_CARE_PROVIDER_SITE_OTHER): Payer: Medicare Other | Admitting: Internal Medicine

## 2018-07-16 VITALS — BP 130/80 | HR 54 | Temp 97.6°F | Ht 66.0 in | Wt 151.0 lb

## 2018-07-16 DIAGNOSIS — Z23 Encounter for immunization: Secondary | ICD-10-CM | POA: Diagnosis not present

## 2018-07-16 DIAGNOSIS — R519 Headache, unspecified: Secondary | ICD-10-CM

## 2018-07-16 DIAGNOSIS — R51 Headache: Secondary | ICD-10-CM | POA: Diagnosis not present

## 2018-07-16 NOTE — Progress Notes (Signed)
   Subjective:    Patient ID: Monique [name], female    DOB: [birth year], 82 y.o.   MRN: [id number]  HPI The patient is an 82 YO female coming in for pain on the side of her head. Started several weeks ago but has been off and on for at least a few months (although review of chart indicates jan this year she had evaluation with cardiology for this problem). She has had MRI brain with MRA without any findings. Is off and on. When there it hurts 7/10. She has tried nothing for it as she was not sure it was a headache. Overall it is stable. Happens maybe once or twice a week. Denies change in caffeine, sleep, falls, triggers. No pain currently.   Review of Systems  Constitutional: Negative.   HENT: Negative.   Eyes: Negative.   Respiratory: Negative for cough, chest tightness and shortness of breath.   Cardiovascular: Negative for chest pain, palpitations and leg swelling.  Gastrointestinal: Negative for abdominal distention, abdominal pain, constipation, diarrhea, nausea and vomiting.  Musculoskeletal: Negative.   Skin: Negative.   Neurological: Positive for headaches.  Psychiatric/Behavioral: Negative.       Objective:   Physical Exam  Constitutional: She is oriented to person, place, and time. She appears well-developed and well-nourished.  HENT:  Head: Normocephalic and atraumatic.  Eyes: EOM are normal.  Neck: Normal range of motion.  Cardiovascular: Normal rate and regular rhythm.  Pulmonary/Chest: Effort normal and breath sounds normal. No respiratory distress. She has no wheezes. She has no rales.  Abdominal: Soft. Bowel sounds are normal. She exhibits no distension. There is no tenderness. There is no rebound.  Musculoskeletal: She exhibits no edema.  Neurological: She is alert and oriented to person, place, and time. Coordination normal.  Skin: Skin is warm and dry.  Psychiatric: She has a normal mood and affect.   Vitals:   07/16/18 1302  BP: 130/80  Pulse: (!) 54    Temp: 97.6 F (36.4 C)  TempSrc: Oral  SpO2: 97%  Weight: 151 lb (68.5 kg)  Height: 5\' 6"  (1.676 m)      Assessment & Plan:  Flu shot given at visit

## 2018-07-16 NOTE — Assessment & Plan Note (Signed)
History is inconsistent with records as patient states this only ever started a few months ago but workup by cardiology with MRI/MRA head and neck Jan 2019 and notes back to 2017 consistent story to today. Advised that this is likely not a serious condition. She can try tylenol for pain. Talked to her about headache diary to help localize triggers and work on avoidance.

## 2018-07-16 NOTE — Patient Instructions (Signed)
We will have you keep a diary of the headaches including the meals before the headache and how much you are sleeping.   When you get a headache right now when the headache came and left and then write down the last 3 meals before the headaches and caffeine and any chocolate and how much you are sleeping.   It is okay to try tylenol for the pain in the head.

## 2018-08-04 ENCOUNTER — Other Ambulatory Visit: Payer: Self-pay | Admitting: Internal Medicine

## 2018-08-17 ENCOUNTER — Other Ambulatory Visit: Payer: Self-pay | Admitting: Internal Medicine

## 2018-08-24 ENCOUNTER — Other Ambulatory Visit: Payer: Self-pay | Admitting: Internal Medicine

## 2018-08-26 ENCOUNTER — Other Ambulatory Visit: Payer: Self-pay | Admitting: Internal Medicine

## 2018-10-01 ENCOUNTER — Other Ambulatory Visit: Payer: Self-pay | Admitting: Physician Assistant

## 2018-10-01 DIAGNOSIS — R079 Chest pain, unspecified: Secondary | ICD-10-CM

## 2018-10-03 ENCOUNTER — Other Ambulatory Visit: Payer: Self-pay | Admitting: Internal Medicine

## 2018-10-16 ENCOUNTER — Ambulatory Visit: Payer: Medicare Other | Admitting: Internal Medicine

## 2018-10-17 ENCOUNTER — Ambulatory Visit (INDEPENDENT_AMBULATORY_CARE_PROVIDER_SITE_OTHER)
Admission: RE | Admit: 2018-10-17 | Discharge: 2018-10-17 | Disposition: A | Discharge status: Home / Self Care | Payer: Medicare Other | Source: Ambulatory Visit | Attending: Internal Medicine | Admitting: Internal Medicine

## 2018-10-17 ENCOUNTER — Other Ambulatory Visit (INDEPENDENT_AMBULATORY_CARE_PROVIDER_SITE_OTHER): Payer: Medicare Other

## 2018-10-17 ENCOUNTER — Encounter: Payer: Self-pay | Admitting: Internal Medicine

## 2018-10-17 ENCOUNTER — Ambulatory Visit (INDEPENDENT_AMBULATORY_CARE_PROVIDER_SITE_OTHER): Payer: Medicare Other | Admitting: Internal Medicine

## 2018-10-17 ENCOUNTER — Other Ambulatory Visit: Payer: Self-pay | Admitting: Internal Medicine

## 2018-10-17 VITALS — BP 124/78 | HR 59 | Temp 97.8°F | Resp 16 | Ht 66.0 in | Wt 148.0 lb

## 2018-10-17 DIAGNOSIS — E119 Type 2 diabetes mellitus without complications: Secondary | ICD-10-CM

## 2018-10-17 DIAGNOSIS — R519 Headache, unspecified: Secondary | ICD-10-CM

## 2018-10-17 DIAGNOSIS — M542 Cervicalgia: Secondary | ICD-10-CM | POA: Diagnosis not present

## 2018-10-17 DIAGNOSIS — R51 Headache: Secondary | ICD-10-CM

## 2018-10-17 LAB — COMPREHENSIVE METABOLIC PANEL
ALT: 7 U/L (ref 0–35)
AST: 17 U/L (ref 0–37)
Albumin: 4.1 g/dL (ref 3.5–5.2)
Alkaline Phosphatase: 94 U/L (ref 39–117)
BUN: 23 mg/dL (ref 6–23)
CHLORIDE: 105 meq/L (ref 96–112)
CO2: 26 mEq/L (ref 19–32)
Calcium: 9.7 mg/dL (ref 8.4–10.5)
Creatinine, Ser: 1.61 mg/dL — ABNORMAL HIGH (ref 0.40–1.20)
GFR: 37.2 mL/min — ABNORMAL LOW (ref >60.00)
Glucose, Bld: 85 mg/dL (ref 70–99)
Potassium: 4.2 mEq/L (ref 3.5–5.1)
SODIUM: 141 meq/L (ref 135–145)
Total Bilirubin: 0.6 mg/dL (ref 0.2–1.2)
Total Protein: 7.2 g/dL (ref 6.0–8.3)

## 2018-10-17 LAB — LIPID PANEL
Cholesterol: 115 mg/dL (ref 0–200)
HDL: 45 mg/dL (ref >39.00)
LDL Cholesterol: 53 mg/dL (ref 0–99)
NonHDL: 69.62
Total CHOL/HDL Ratio: 3
Triglycerides: 81 mg/dL (ref 0.0–149.0)
VLDL: 16.2 mg/dL (ref 0.0–40.0)

## 2018-10-17 LAB — HEMOGLOBIN A1C: Hgb A1c MFr Bld: 6.9 % — ABNORMAL HIGH (ref 4.6–6.5)

## 2018-10-17 NOTE — Patient Instructions (Signed)
We will check the x-ray of the neck and the blood work today.

## 2018-10-17 NOTE — Progress Notes (Signed)
   Subjective:   Patient ID: Monique [name], female    DOB: 1936/06/18, 83 y.o.   MRN: [id number]  HPI The patient is an 83 YO female coming in for headaches. Pain feels like it is shooting through her head. These have been present for several years. We talked about this about 3 months ago and at that time they were a couple per week. Denies changes in symptoms since that time. Does not take anything due to the episodes lasting only a couple of minutes.. Has taking tylenol for them rarely. Has not kept a headache journal as advised and does not know any triggers for these episodes. Lately they feel more like they are coming from her neck and the pain stems there. She does have some pain in her neck as well. This has not been evaluated previously.   Review of Systems  Constitutional: Negative.   HENT: Negative.   Eyes: Negative.   Respiratory: Negative for cough, chest tightness and shortness of breath.   Cardiovascular: Negative for chest pain, palpitations and leg swelling.  Gastrointestinal: Negative for abdominal distention, abdominal pain, constipation, diarrhea, nausea and vomiting.  Musculoskeletal: Negative.   Skin: Negative.   Neurological: Positive for headaches.  Psychiatric/Behavioral: Negative.     Objective:  Physical Exam Constitutional:      Appearance: She is well-developed.  HENT:     Head: Normocephalic and atraumatic.  Neck:     Musculoskeletal: Normal range of motion.  Cardiovascular:     Rate and Rhythm: Normal rate and regular rhythm.  Pulmonary:     Effort: Pulmonary effort is normal. No respiratory distress.     Breath sounds: Normal breath sounds. No wheezing or rales.  Abdominal:     General: Bowel sounds are normal. There is no distension.     Palpations: Abdomen is soft.     Tenderness: There is no abdominal tenderness. There is no rebound.  Musculoskeletal:        General: Tenderness present.  Skin:    General: Skin is warm and dry.    Neurological:     Mental Status: She is alert and oriented to person, place, and time.     Coordination: Coordination normal.     Vitals:   10/17/18 1011  BP: 124/78  Pulse: (!) 59  Resp: 16  Temp: 97.8 F (36.6 C)  TempSrc: Oral  SpO2: 97%  Weight: 148 lb (67.1 kg)  Height: 5\' 6"  (1.676 m)    Assessment & Plan:

## 2018-10-18 NOTE — Assessment & Plan Note (Signed)
Wants follow up of HgA1c and cholesterol today which are due.

## 2018-10-18 NOTE — Assessment & Plan Note (Signed)
Ordered x-ray cervical spine to evaluate as source. Given that episodes last less than a few minutes she does not need medication for them at this time. They are not daily. Talked to her about caffeine and consideration of headache journal to help [place] isolate the trigger if any.

## 2018-10-28 ENCOUNTER — Other Ambulatory Visit: Payer: Self-pay | Admitting: Internal Medicine

## 2018-10-29 ENCOUNTER — Other Ambulatory Visit: Payer: Self-pay | Admitting: Internal Medicine

## 2019-01-03 ENCOUNTER — Other Ambulatory Visit: Payer: Self-pay | Admitting: Internal Medicine

## 2019-01-03 ENCOUNTER — Telehealth: Payer: Self-pay

## 2019-01-03 NOTE — Telephone Encounter (Signed)
LMTCB- Pt needs to be set up for a 5/7 or 5/8 virtual visit with Dr. Harrington Challenger

## 2019-01-03 NOTE — Telephone Encounter (Addendum)
Xarelto 15mg  refill requested. Pt is 83 yrs old, 148 lbs (67.1kg), Crea 1.61 (08-17-19), CrCL 29.54 mg/min. Last saw Dr. Harrington Challenger 06-08-2018. Messaged Michalene, RN, in reference to the pt about scheduling the pt a future appointment because she does not have a follow up date on her AVS or recall in the chart.

## 2019-01-25 ENCOUNTER — Other Ambulatory Visit: Payer: Self-pay | Admitting: Internal Medicine

## 2019-01-31 ENCOUNTER — Other Ambulatory Visit: Payer: Self-pay | Admitting: Internal Medicine

## 2019-02-12 ENCOUNTER — Ambulatory Visit (INDEPENDENT_AMBULATORY_CARE_PROVIDER_SITE_OTHER): Payer: Medicare Other | Admitting: Internal Medicine

## 2019-02-12 ENCOUNTER — Other Ambulatory Visit: Payer: Self-pay

## 2019-02-12 ENCOUNTER — Other Ambulatory Visit (INDEPENDENT_AMBULATORY_CARE_PROVIDER_SITE_OTHER): Payer: Medicare Other

## 2019-02-12 ENCOUNTER — Encounter: Payer: Self-pay | Admitting: Internal Medicine

## 2019-02-12 VITALS — BP 140/90 | HR 66 | Temp 98.2°F | Ht 66.0 in | Wt 150.0 lb

## 2019-02-12 DIAGNOSIS — I10 Essential (primary) hypertension: Secondary | ICD-10-CM | POA: Diagnosis not present

## 2019-02-12 DIAGNOSIS — N183 Chronic kidney disease, stage 3 unspecified: Secondary | ICD-10-CM

## 2019-02-12 DIAGNOSIS — R399 Unspecified symptoms and signs involving the genitourinary system: Secondary | ICD-10-CM | POA: Diagnosis not present

## 2019-02-12 DIAGNOSIS — E119 Type 2 diabetes mellitus without complications: Secondary | ICD-10-CM

## 2019-02-12 LAB — POCT URINALYSIS DIPSTICK
Blood, UA: NEGATIVE
Glucose, UA: NEGATIVE
Ketones, UA: NEGATIVE
Leukocytes, UA: NEGATIVE
Nitrite, UA: NEGATIVE
Protein, UA: NEGATIVE
Spec Grav, UA: 1.025 (ref 1.010–1.025)
Urobilinogen, UA: 0.2 E.U./dL
pH, UA: 6 (ref 5.0–8.0)

## 2019-02-12 LAB — COMPREHENSIVE METABOLIC PANEL
ALT: 7 U/L (ref 0–35)
AST: 17 U/L (ref 0–37)
Albumin: 4.1 g/dL (ref 3.5–5.2)
Alkaline Phosphatase: 93 U/L (ref 39–117)
BUN: 26 mg/dL — ABNORMAL HIGH (ref 6–23)
CO2: 27 mEq/L (ref 19–32)
Calcium: 9.6 mg/dL (ref 8.4–10.5)
Chloride: 106 mEq/L (ref 96–112)
Creatinine, Ser: 1.29 mg/dL — ABNORMAL HIGH (ref 0.40–1.20)
GFR: 48.01 mL/min — ABNORMAL LOW (ref >60.00)
Glucose, Bld: 92 mg/dL (ref 70–99)
Potassium: 3.6 mEq/L (ref 3.5–5.1)
Sodium: 142 mEq/L (ref 135–145)
Total Bilirubin: 0.4 mg/dL (ref 0.2–1.2)
Total Protein: 7.2 g/dL (ref 6.0–8.3)

## 2019-02-12 LAB — POCT UA - MICROALBUMIN
Creatinine, POC: 10 mg/dL
Microalbumin Ur, POC: 80 mg/L

## 2019-02-12 LAB — HEMOGLOBIN A1C: Hgb A1c MFr Bld: 7 % — ABNORMAL HIGH (ref 4.6–6.5)

## 2019-02-12 NOTE — Progress Notes (Signed)
   Subjective:   Patient ID: Monique [name], female    DOB: 22-Aug-1936, 83 y.o.   MRN: [id number]  HPI The patient is an 83 YO female coming in for follow up diabetes (taking metformin, denies low sugars, denies new numbness or weakness, denies side effects, diet stable) and blood pressure (taking amlodipine and disopyramide and metoprolol, denies chest pains, denies side effects) and potential UTI symptoms (started 2 days ago, denies burning or odor, some pressure in the lower abdomen, urgency and frequency, tried drinking more fluids, denies fevers or chills)  Review of Systems  Constitutional: Negative.   HENT: Negative.   Eyes: Negative.   Respiratory: Negative.  Negative for cough, chest tightness and shortness of breath.   Cardiovascular: Negative.  Negative for chest pain, palpitations and leg swelling.  Gastrointestinal: Negative for abdominal distention, abdominal pain, constipation, diarrhea, nausea and vomiting.  Genitourinary: Positive for frequency and urgency. Negative for difficulty urinating and dysuria.  Musculoskeletal: Negative.   Skin: Negative.   Neurological: Negative.   Psychiatric/Behavioral: Negative.     Objective:  Physical Exam Constitutional:      Appearance: She is well-developed.  HENT:     Head: Normocephalic and atraumatic.  Neck:     Musculoskeletal: Normal range of motion.  Cardiovascular:     Rate and Rhythm: Normal rate and regular rhythm.  Pulmonary:     Effort: Pulmonary effort is normal. No respiratory distress.     Breath sounds: Normal breath sounds. No wheezing or rales.  Abdominal:     General: Bowel sounds are normal. There is no distension.     Palpations: Abdomen is soft.     Tenderness: There is no abdominal tenderness. There is no rebound.  Skin:    General: Skin is warm and dry.  Neurological:     Mental Status: She is alert and oriented to person, place, and time.     Coordination: Coordination normal.     Vitals:   02/12/19 1402  BP: 140/90  Pulse: 66  Temp: 98.2 F (36.8 C)  TempSrc: Oral  SpO2: 97%  Weight: 150 lb (68 kg)  Height: 5\' 6"  (1.676 m)    Assessment & Plan:

## 2019-02-12 NOTE — Patient Instructions (Signed)
You do not have a urine infection today.  We are checking the labs for the diabetes today.

## 2019-02-13 DIAGNOSIS — R399 Unspecified symptoms and signs involving the genitourinary system: Secondary | ICD-10-CM | POA: Insufficient documentation

## 2019-02-13 NOTE — Assessment & Plan Note (Signed)
Checking HgA1c and CMP given rise in creatinine last time. Microalbumin to creatinine ratio with microalbuminuria. Taking metformin 500 mg daily, on statin but not ACE-I or ARB and will discuss at next visit.

## 2019-02-13 NOTE — Assessment & Plan Note (Signed)
Slight worsening last labs and concurrent diabetes and HTN both well controlled at this time. Checking CMP and adjust as needed.

## 2019-02-13 NOTE — Assessment & Plan Note (Signed)
U/A done in the office without signs of infection. Could be related to some dehydration. Advised to drink plenty of fluids especially now that temps are getting much hotter outside.

## 2019-02-13 NOTE — Assessment & Plan Note (Signed)
BP at goal on amlodipine and disopyramide. Checking CMP given rise in creatinine on last labs and CKD stage 3.

## 2019-03-07 ENCOUNTER — Other Ambulatory Visit: Payer: Self-pay | Admitting: Internal Medicine

## 2019-03-15 ENCOUNTER — Ambulatory Visit: Payer: Medicare Other | Admitting: Gynecology

## 2019-03-25 ENCOUNTER — Other Ambulatory Visit: Payer: Self-pay

## 2019-03-26 ENCOUNTER — Encounter: Payer: Self-pay | Admitting: Internal Medicine

## 2019-03-26 ENCOUNTER — Ambulatory Visit: Payer: Medicare Other | Admitting: Gynecology

## 2019-03-26 ENCOUNTER — Encounter: Payer: Self-pay | Admitting: Gynecology

## 2019-03-26 VITALS — BP 122/76 | Ht 65.0 in | Wt 150.0 lb

## 2019-03-26 DIAGNOSIS — Z01419 Encounter for gynecological examination (general) (routine) without abnormal findings: Secondary | ICD-10-CM

## 2019-03-26 DIAGNOSIS — R1031 Right lower quadrant pain: Secondary | ICD-10-CM

## 2019-03-26 DIAGNOSIS — G8929 Other chronic pain: Secondary | ICD-10-CM | POA: Diagnosis not present

## 2019-03-26 DIAGNOSIS — N952 Postmenopausal atrophic vaginitis: Secondary | ICD-10-CM | POA: Diagnosis not present

## 2019-03-26 DIAGNOSIS — Z803 Family history of malignant neoplasm of breast: Secondary | ICD-10-CM | POA: Diagnosis not present

## 2019-03-26 DIAGNOSIS — R921 Mammographic calcification found on diagnostic imaging of breast: Secondary | ICD-10-CM | POA: Diagnosis not present

## 2019-03-26 NOTE — Patient Instructions (Signed)
Schedule and follow-up for the bone density in our office  Schedule and follow-up for the ultrasound in our office

## 2019-03-26 NOTE — Progress Notes (Signed)
    Monique [name] [birth year] [id number]        83 y.o.  [id number] new patient presents not having had a GYN exam in a number of years complaining of chronic right lower quadrant pain.  Patient notes for the past year or so right lower quadrant pain that comes and goes in a aching to stabbing nature.  Can go months without it and then it will come in last 4 days.  Without significant constipation or diarrhea.  Some urinary frequency but no dysuria, urgency, low back pain, fever or chills.  Status post hysterectomy in the past for leiomyoma.  No history of abnormal Pap smears.  Review of her records shows a pelvic ultrasound 2016 for right lower quadrant pain which was negative.  Ovaries were not visualized at that time.  Past medical history,surgical history, problem list, medications, allergies, family history and social history were all reviewed and documented as reviewed in the EPIC chart.  ROS:  Performed with pertinent positives and negatives included in the history, assessment and plan.   Additional significant findings : None   Exam: Caryn Bee assistant Vitals:   03/26/19 1524  BP: 122/76  Weight: 150 lb (68 kg)  Height: 5\' 5"  (1.651 m)   Body mass index is 24.96 kg/m.  General appearance:  Normal affect, orientation and appearance. Skin: Grossly normal HEENT: Without gross lesions.  No cervical or supraclavicular adenopathy. Thyroid normal.  Lungs:  Clear without wheezing, rales or rhonchi Cardiac: RR, without RMG Abdominal:  Soft, nontender, without masses, guarding, rebound, organomegaly or hernia Breasts:  Examined lying and sitting without masses, retractions, discharge or axillary adenopathy. Pelvic:  Ext, BUS, Vagina: With atrophic changes  Adnexa: Without masses or tenderness    Anus and perineum: Normal   Rectovaginal: Normal sphincter tone without palpated masses or tenderness.    Assessment/Plan:  83 y.o. [id number] female with:  1. Chronic recurrent right lower  quadrant pain of the last several years.  Exam is negative.  Discussed differential to include GI such as diverticulitis, adhesions, ischemic.  Also discussed urinary such as bladder/ureter although recent urine analysis in June was negative.  Check urine analysis today.  GYN to include ovarian although her bimanual was normal we will go ahead and check an ultrasound and she will schedule that and lastly we discussed musculoskeletal possible referral from low back.  We will start with the ultrasound assuming negative then recommended follow-up with GI to see if they feel proceeding with CT scan or other studies indicated.  Last colonoscopy 2017. 2. Mammography reported today.  Breast exam is normal today. 3. DEXA never.  Recommend patient schedule and she is going to do that today through our office. 4. Pap smear a number of years ago.  No history of abnormal Pap smears.  Discussed current screening guidelines and no Pap smear done based on age and hysterectomy history. 5. Health maintenance.  No routine lab work done as patient does this elsewhere.  Follow-up for bone density and GYN ultrasound.   Anastasio Auerbach MD, 3:43 PM 03/26/2019

## 2019-03-27 ENCOUNTER — Other Ambulatory Visit: Payer: Self-pay | Admitting: *Deleted

## 2019-03-27 DIAGNOSIS — Z13820 Encounter for screening for osteoporosis: Secondary | ICD-10-CM

## 2019-03-27 DIAGNOSIS — G8929 Other chronic pain: Secondary | ICD-10-CM

## 2019-03-27 DIAGNOSIS — R1031 Right lower quadrant pain: Secondary | ICD-10-CM

## 2019-03-27 LAB — URINALYSIS, COMPLETE W/RFL CULTURE
Bacteria, UA: NONE SEEN /HPF
Bilirubin Urine: NEGATIVE
Glucose, UA: NEGATIVE
Hyaline Cast: NONE SEEN /LPF
Ketones, ur: NEGATIVE
Leukocyte Esterase: NEGATIVE
Nitrites, Initial: NEGATIVE
Protein, ur: NEGATIVE
RBC / HPF: NONE SEEN /HPF (ref 0–2)
Specific Gravity, Urine: 1.02 (ref 1.001–1.03)
WBC, UA: NONE SEEN /HPF (ref 0–5)
pH: <=5 (ref 5.0–8.0)

## 2019-03-27 LAB — NO CULTURE INDICATED

## 2019-04-01 ENCOUNTER — Other Ambulatory Visit: Payer: Self-pay | Admitting: Internal Medicine

## 2019-04-01 NOTE — Telephone Encounter (Signed)
Pt last saw Dr Harrington Challenger on 06/08/18, last labs 02/12/19 Creat 1.29, age 83, weight 68kg, CrCl 36.72, based on CrCl pt is on appropriate dosage of Xarelto 15mg  QD.  Will refill rx.

## 2019-04-06 DIAGNOSIS — M810 Age-related osteoporosis without current pathological fracture: Secondary | ICD-10-CM

## 2019-04-06 HISTORY — DX: Age-related osteoporosis without current pathological fracture: M81.0

## 2019-04-15 ENCOUNTER — Other Ambulatory Visit: Payer: Self-pay

## 2019-04-16 ENCOUNTER — Ambulatory Visit (INDEPENDENT_AMBULATORY_CARE_PROVIDER_SITE_OTHER): Payer: Medicare Other

## 2019-04-16 ENCOUNTER — Other Ambulatory Visit: Payer: Self-pay | Admitting: Gynecology

## 2019-04-16 DIAGNOSIS — Z780 Asymptomatic menopausal state: Secondary | ICD-10-CM

## 2019-04-16 DIAGNOSIS — Z13820 Encounter for screening for osteoporosis: Secondary | ICD-10-CM

## 2019-04-16 DIAGNOSIS — M810 Age-related osteoporosis without current pathological fracture: Secondary | ICD-10-CM

## 2019-04-17 ENCOUNTER — Telehealth: Payer: Self-pay | Admitting: Gynecology

## 2019-04-17 ENCOUNTER — Encounter: Payer: Self-pay | Admitting: Gynecology

## 2019-04-17 NOTE — Telephone Encounter (Signed)
Tell patient her most recent bone density shows osteoporosis.  Recommend office visit to discuss treatment options.  She is at significant risk of fracture if she falls.

## 2019-04-17 NOTE — Telephone Encounter (Signed)
Patient informed, declined visit at the time.

## 2019-04-19 ENCOUNTER — Other Ambulatory Visit: Payer: Self-pay | Admitting: Internal Medicine

## 2019-04-28 NOTE — Progress Notes (Addendum)
Cardiology Office Note    Date:  04/29/2019   ID:  [name], [name] 1935/12/05, MRN [id number]  PCP:  Hoyt Koch, MD  Cardiologist: Dr. Harrington Challenger  Chief Complaint: Follow up for HOCM & Afib  History of Present Illness:   Monique [name] is a 83 y.o. female with hx of HOCM, HTN, PAF, bilateral PE, DVT, CKD stage III and HLD presents for follow up.   Last echo 11/2014 showed LVEF of 65-70%, dynamic obstruction, severe LVH,  grade 1 DD, mild MR. Low risk stress test 11/2014.  Pt admitted in Jan 2019 with CP   Myovue showed no ischemia      I saw her back in October 2019    The patient denies SOB   Has occcasional CP in AM when she first gets out of bed   Note she takes protonix in AM   Denies acid taste in mouth She also complains of R frontal headache   Has been sneezing some    HA comes and goes   No visual changes or other neuro complaints R sided frontal HA  Comes and goes   Past Medical History:  Diagnosis Date  . Abdominal pain 11/18/2015  . Acute respiratory failure with hypoxia ([place]) 06/27/2014  . Breast pain, left 10/11/2016  . Cerebral aneurysm 2000 06/27/2014  . Chest pain 10/30/2014  . CHF (congestive heart failure) ([place])   . Chronic anticoagulation 06/2014   Coumadin started after bilateral PE  . Chronic kidney disease, stage III (moderate) ([place]) 11/21/2014  . Clotting disorder ([place])    right was removed, still have left cataract  . Diabetes ([place])   . Dizziness and giddiness 11/28/2014  . DVT of lower extremity (deep venous thrombosis) ([place]) 11/28/2014  . Dysuria 07/07/2014  . Elevated troponin 11/28/2014  . Fatty liver   . Gastritis 05/16/2012   Chronic dyspepsia on PPI daily.   Marland Kitchen Headache, acute 06/08/2010   Qualifier: Diagnosis of  By: Linda Hedges MD, Heinz Knuckles   . Heart murmur   . Hematochezia 11/18/2015  . History of fibromuscular dysplasia 06/27/2014  . HTN (hypertension)    Essential  . Hyperlipidemia   . Hypertrophic obstructive cardiomyopathy  (HOCM) ([place]) 05/20/2008   Qualifier: Diagnosis of  By: Linda Hedges MD, Heinz Knuckles   . Kidney stones   . LVH (left ventricular hypertrophy)   . Orthostatic hypotension 11/28/2014  . PAF (paroxysmal atrial fibrillation) ([place]) 2000  . Palpitations 11/28/2014  . PAROXYSMAL ATRIAL FIBRILLATION 05/20/2008   Qualifier: Diagnosis of  By: Linda Hedges MD, Heinz Knuckles   . PE (pulmonary embolism) 06/2014   Bilateral  . Postmenopausal osteoporosis 04/2019   T score -3.3  . Pulmonary embolism, bilateral ([place]) 06/27/2014  . SOB (shortness of breath) 06/27/2014  . Subarachnoid hemorrhage ([place]) 2000  . UTI (urinary tract infection) 01/04/2015    Past Surgical History:  Procedure Laterality Date  . ABDOMINAL HYSTERECTOMY  2000  . Brain hemorrage     2000  . CARDIAC CATHETERIZATION  05/2008   Nl cors, no gradient, EF 60%    Current Medications: Prior to Admission medications   Medication Sig Start Date End Date Taking? Authorizing Provider  amLODipine (NORVASC) 5 MG tablet Take 1 tablet (5 mg total) by mouth 2 (two) times daily. 12/02/16   Fay Records, MD  disopyramide (NORPACE) 150 MG capsule Take 1 capsule (150 mg total) by mouth 2 (two) times daily. 01/13/17   Fay Records, MD  metoprolol tartrate (  LOPRESSOR) 25 MG tablet Take 1 tablet (25 mg total) by mouth 2 (two) times daily. 12/06/16   Fay Records, MD  mirtazapine (REMERON) 15 MG tablet TAKE 1 TABLET (15 MG TOTAL) BY MOUTH AT BEDTIME. 06/20/17   Hoyt Koch, MD  montelukast (SINGULAIR) 10 MG tablet Take 1 tablet (10 mg total) by mouth at bedtime. 06/20/17   Hoyt Koch, MD  nitroGLYCERIN (NITROSTAT) 0.4 MG SL tablet Place 1 tablet (0.4 mg total) under the tongue every 5 (five) minutes as needed for chest pain. 03/30/15   Fay Records, MD  pantoprazole (PROTONIX) 40 MG tablet Take 1 tablet (40 mg total) by mouth daily. Need annual visit of further refills 06/07/17   Hoyt Koch, MD  Rivaroxaban (XARELTO) 15 MG TABS tablet Take 1  tablet (15 mg total) by mouth daily with supper. 01/17/17   Fay Records, MD  sertraline (ZOLOFT) 50 MG tablet TAKE 1 TABLET (50 MG TOTAL) BY MOUTH DAILY. 02/24/15   Hoyt Koch, MD  simvastatin (ZOCOR) 40 MG tablet TAKE 1 TABLET (40 MG TOTAL) BY MOUTH AT BEDTIME. 01/13/17   Fay Records, MD    Allergies:   Patient has no known allergies.   Social History   Socioeconomic History  . Marital status: Divorced    Spouse name: Not on file  . Number of children: 3  . Years of education: 76  . Highest education level: Not on file  Occupational History  . Occupation: Retired [employer]  . Financial resource strain: Not on file  . Food insecurity    Worry: Not on file    Inability: Not on file  . Transportation needs    Medical: Not on file    Non-medical: Not on file  Tobacco Use  . Smoking status: Former Smoker    Quit date: 09/06/1967    Years since quitting: 51.6  . Smokeless tobacco: Never Used  Substance and Sexual Activity  . Alcohol use: No    Alcohol/week: 0.0 standard drinks  . Drug use: No  . Sexual activity: Not Currently    Comment: 1st intercourse 83yo-Fewer than 5 partners  Lifestyle  . Physical activity    Days per week: Not on file    Minutes per session: Not on file  . Stress: Not on file  Relationships  . Social Herbalist on phone: Not on file    Gets together: Not on file    Attends religious service: Not on file    Active member of club or organization: Not on file    Attends meetings of clubs or organizations: Not on file    Relationship status: Not on file  Other Topics Concern  . Not on file  Social History Narrative   HSG. Married '61 - '88/divorced. 3 sons - '74, '59, '62, 1 daughter '63; 5 grandchildren. Lives alone in two story home. She does not drive.   Retired from [employer].     Family History:  The patient's family history includes Arrhythmia in her mother and sister; Breast  cancer (age of onset: 34) in her daughter; Breast cancer (age of onset: 61) in her mother; Heart disease in her mother; Stroke in her mother.   ROS:   Please see the history of present illness.    ROS All other systems reviewed and are negative.   PHYSICAL EXAM:   VS:  BP (!) 154/86   Pulse 64  Ht 5\' 6"  (1.676 m)   Wt 149 lb 1.9 oz (67.6 kg)   BMI 24.07 kg/m    GEN: Well nourished, well developed, in no acute distress  HEENT: normal  Neck: JVP is normal  No, carotid bruits, or masses Cardiac: RRR; Gr I/VI systlic murmur at base  NO, rubs, or gallops, No LE edema  Respiratory:  clear to auscultation bilaterally, normal work of breathing GI: soft, nontender, nondistended, + BS MS: no deformity or atrophy  Skin: warm and dry, no rash Neuro:  Alert and Oriented x 3, Strength and sensation are intact Psych: euthymic mood, full affect  Wt Readings from Last 3 Encounters:  04/29/19 149 lb 1.9 oz (67.6 kg)  03/26/19 150 lb (68 kg)  02/12/19 150 lb (68 kg)      Studies/Labs Reviewed:   EKG:  EKG is  ordered today  SR 64 bpm   LVH with repol abnormality  Recent Labs: 06/08/2018: Hemoglobin 12.9; Platelets 191 02/12/2019: ALT 7; BUN 26; Creatinine, Ser 1.29; Potassium 3.6; Sodium 142   Lipid Panel    Component Value Date/Time   CHOL 115 10/17/2018 1028   CHOL 140 11/11/2016 0900   TRIG 81.0 10/17/2018 1028   HDL 45.00 10/17/2018 1028   HDL 51 11/11/2016 0900   CHOLHDL 3 10/17/2018 1028   VLDL 16.2 10/17/2018 1028   LDLCALC 53 10/17/2018 1028   LDLCALC 65 11/11/2016 0900    Additional studies/ records that were reviewed today include:  Echo 09/09/17 Study Conclusions  - Left ventricle: The cavity size was normal. Wall thickness was   increased in a pattern of moderate LVH. Systolic function was   vigorous. The estimated ejection fraction was in the range of 65%   to 70%. There is an intracavitary gradient created by asymmetic   LVH, mild SAM, and hyperdnamic systolic  function. Difficult   assessment of peak gradient from available tracings, peak   gradient 34 mmHg. No provocative maneuvers were performed.   Doppler parameters are consistent with abnormal left ventricular   relaxation (grade 1 diastolic dysfunction). - Aortic valve: Mildly calcified annulus. Mildly thickened   leaflets. Mean gradient (S): 21 mm Hg. Morphologically the valve   looks normal, the measured gradient appears to be subvalvular   from her dynamic intracaviatry gradient. Valve area (VTI): 1.55   cm^2. Valve area (Vmax): 2.05 cm^2. - Mitral valve: Mildly calcified annulus. Normal thickness leaflets   . - Technically difficult study.   ASSESSMENT & PLAN:   1  Chest pain  Episodes souncd like they may be GI in origin   REcomm trying protonix at night instead of AM   2  PAF . Denies palpitations  Continue metoprolol and Xarelto 15mg  qd.   3. HTN -BP is up today but has been good at other visits   I asked her to follow but I would not make changes now   4. HOCM Keep on current regimen    Minimal murmur on exam     5   PE  Continue on Xarelto    6. HLD - 10/17/2018: Cholesterol 115; HDL 45.00; LDL Cholesterol 53; Triglycerides 81.0; VLDL 16.2  -Continue statin.  7. CKD stage III -  Will check electrolytes today    8   Headache   ? Sinus  With sneezing  ? Allergies and sinus congestion   PT concerned   Will get sinus CT  9   Hx SAH   I am not convinced HA  represents bleed   Intermitt  Medication Adjustments/Labs and Tests Ordered: Current medicines are reviewed at length with the patient today.  Concerns regarding medicines are outlined above.  Medication changes, Labs and Tests ordered today are listed in the Patient Instructions below. There are no Patient Instructions on file for this visit.   Signed, Dorris Carnes, MD  04/29/2019 4:32 PM    [facility] [place], [place], [place]  [address] Phone: [phone]; Fax: [phone]

## 2019-04-29 ENCOUNTER — Other Ambulatory Visit: Payer: Self-pay

## 2019-04-29 ENCOUNTER — Encounter: Payer: Self-pay | Admitting: Internal Medicine

## 2019-04-29 ENCOUNTER — Ambulatory Visit (INDEPENDENT_AMBULATORY_CARE_PROVIDER_SITE_OTHER): Payer: Medicare Other | Admitting: Internal Medicine

## 2019-04-29 VITALS — BP 154/86 | HR 64 | Ht 66.0 in | Wt 149.1 lb

## 2019-04-29 DIAGNOSIS — I10 Essential (primary) hypertension: Secondary | ICD-10-CM | POA: Diagnosis not present

## 2019-04-29 DIAGNOSIS — R51 Headache: Secondary | ICD-10-CM | POA: Diagnosis not present

## 2019-04-29 DIAGNOSIS — E782 Mixed hyperlipidemia: Secondary | ICD-10-CM

## 2019-04-29 DIAGNOSIS — R519 Headache, unspecified: Secondary | ICD-10-CM

## 2019-04-29 NOTE — Patient Instructions (Signed)
Medication Instructions:  No changes If you need a refill on your cardiac medications before your next appointment, please call your pharmacy.   Lab work: Today: cbc, tsh, bmet If you have labs (blood work) drawn today and your tests are completely normal, you will receive your results only by: Marland Kitchen MyChart Message (if you have MyChart) OR . A paper copy in the mail If you have any lab test that is abnormal or we need to change your treatment, we will call you to review the results.  Testing/Procedures: CT sinus for intermittent headache.   Follow-Up: At [facility], you and your health needs are our priority.  As part of our continuing mission to provide you with exceptional heart care, we have created designated Provider Care Teams.  These Care Teams include your primary Cardiologist (physician) and Advanced Practice Providers (APPs -  Physician Assistants and Nurse Practitioners) who all work together to provide you with the care you need, when you need it. You will need a follow up appointment in:  8 months.  Please call our office 2 months in advance to schedule this appointment.  You may see Dr. Dorris Carnes or one of the following Advanced Practice Providers on your designated Care Team: Richardson Dopp, PA-C [place], [place] . Daune Perch, NP  Any Other Special Instructions Will Be Listed Below (If Applicable).

## 2019-04-30 LAB — TSH: TSH: 2.66 u[IU]/mL (ref 0.450–4.500)

## 2019-04-30 LAB — CBC
Hematocrit: 40.9 % (ref 34.0–46.6)
Hemoglobin: 13.2 g/dL (ref 11.1–15.9)
MCH: 30.8 pg (ref 26.6–33.0)
MCHC: 32.3 g/dL (ref 31.5–35.7)
MCV: 96 fL (ref 79–97)
Platelets: 193 10*3/uL (ref 150–450)
RBC: 4.28 x10E6/uL (ref 3.77–5.28)
RDW: 13.1 % (ref 11.7–15.4)
WBC: 8.4 10*3/uL (ref 3.4–10.8)

## 2019-04-30 LAB — BASIC METABOLIC PANEL
BUN/Creatinine Ratio: 16 (ref 12–28)
BUN: 22 mg/dL (ref 8–27)
CO2: 24 mmol/L (ref 20–29)
Calcium: 9.9 mg/dL (ref 8.7–10.3)
Chloride: 101 mmol/L (ref 96–106)
Creatinine, Ser: 1.37 mg/dL — ABNORMAL HIGH (ref 0.57–1.00)
GFR calc Af Amer: 42 mL/min/{1.73_m2} — ABNORMAL LOW (ref >59)
GFR calc non Af Amer: 36 mL/min/{1.73_m2} — ABNORMAL LOW (ref >59)
Glucose: 94 mg/dL (ref 65–99)
Potassium: 4.1 mmol/L (ref 3.5–5.2)
Sodium: 142 mmol/L (ref 134–144)

## 2019-05-02 ENCOUNTER — Other Ambulatory Visit: Payer: Self-pay

## 2019-05-02 ENCOUNTER — Other Ambulatory Visit: Payer: Self-pay | Admitting: Gynecology

## 2019-05-02 ENCOUNTER — Encounter: Payer: Self-pay | Admitting: Gynecology

## 2019-05-02 ENCOUNTER — Ambulatory Visit (INDEPENDENT_AMBULATORY_CARE_PROVIDER_SITE_OTHER): Payer: Medicare Other | Admitting: Gynecology

## 2019-05-02 ENCOUNTER — Ambulatory Visit (INDEPENDENT_AMBULATORY_CARE_PROVIDER_SITE_OTHER): Payer: Medicare Other

## 2019-05-02 VITALS — BP 118/76

## 2019-05-02 DIAGNOSIS — M810 Age-related osteoporosis without current pathological fracture: Secondary | ICD-10-CM | POA: Diagnosis not present

## 2019-05-02 DIAGNOSIS — R1031 Right lower quadrant pain: Secondary | ICD-10-CM | POA: Diagnosis not present

## 2019-05-02 DIAGNOSIS — G8929 Other chronic pain: Secondary | ICD-10-CM

## 2019-05-02 NOTE — Progress Notes (Signed)
    Monique [name] 07-23-1936 [id number]        83 y.o.  [id number] presents for ultrasound due to chronic right lower quadrant pain.  Also wants to discuss her recent DEXA T score -3.3  Past medical history,surgical history, problem list, medications, allergies, family history and social history were all reviewed and documented in the EPIC chart.  Directed ROS with pertinent positives and negatives documented in the history of present illness/assessment and plan.  Exam: Vitals:   05/02/19 1400  BP: 118/76   General appearance:  Normal  Ultrasound trans-abdominal shows uterus surgically absent.  Both ovaries visualized, small and atrophic.  No adnexal pathology.  Cul-de-sac negative.  Assessment/Plan:  83 y.o. [id number] with:  1. Chronic abdominal pain.  Ultrasound is negative for GYN pathology.  Recommended patient follow-up with her gastroenterologist and she agrees to arrange. 2. Recent DEXA T score -3.3.  She has no history of DEXA as before.  She is osteopenic/osteoporotic and all measurements.  We discussed her increased risk of fracture and my recommendation to consider medication.  We discussed various medication options.  Currently being treated for reflux.  I recommend that she consider Prolia.  I discussed the risks/side effects to include osteonecrosis of the jaw, atypical fractures, rashes, infections and the need to continue once started.  The patient agrees to starting the medication and we will make arrangements for this.  We will plan on repeating the bone density in 2 years.    Anastasio Auerbach MD, 2:18 PM 05/02/2019

## 2019-05-02 NOTE — Patient Instructions (Signed)
Office will call you to arrange for the Prolia shots for the osteoporosis.

## 2019-05-03 ENCOUNTER — Telehealth: Payer: Self-pay | Admitting: *Deleted

## 2019-05-03 NOTE — Telephone Encounter (Signed)
-----   Message from Anastasio Auerbach, MD sent at 05/02/2019  2:21 PM EDT ----- Make arrangements for Prolia.  Diagnosis osteoporosis T score -3.3 currently being treated for GERD

## 2019-05-10 ENCOUNTER — Other Ambulatory Visit: Payer: Self-pay

## 2019-05-10 ENCOUNTER — Ambulatory Visit (INDEPENDENT_AMBULATORY_CARE_PROVIDER_SITE_OTHER)
Admission: RE | Admit: 2019-05-10 | Discharge: 2019-05-10 | Disposition: A | Discharge status: Home / Self Care | Payer: Medicare Other | Source: Ambulatory Visit | Attending: Internal Medicine | Admitting: Internal Medicine

## 2019-05-10 DIAGNOSIS — R51 Headache: Secondary | ICD-10-CM

## 2019-05-10 DIAGNOSIS — R519 Headache, unspecified: Secondary | ICD-10-CM

## 2019-05-10 NOTE — Telephone Encounter (Signed)
Prolia insurance verification has been sent awaiting Summary of benefits  

## 2019-05-29 NOTE — Telephone Encounter (Signed)
Deductible N/A  OOP PO:[id number] ($215met)  Annual exam 03/26/2019 TF  Calcium 9.9            Date 04/29/2019  Upcoming dental procedures   Prior Authorization needed NO  Pt estimated Cost $222    1 week courtesy call pt will think about getting medication    Coverage Details:20% one dose,20% admin fee

## 2019-06-13 ENCOUNTER — Encounter: Payer: Self-pay | Admitting: Gynecology

## 2019-06-17 NOTE — Telephone Encounter (Signed)
Tried to call pt several times. Phone number given did not ring no answer. Will close encounter and wait on patient to call us

## 2019-07-13 ENCOUNTER — Other Ambulatory Visit: Payer: Self-pay | Admitting: Internal Medicine

## 2019-07-16 ENCOUNTER — Encounter: Payer: Self-pay | Admitting: Internal Medicine

## 2019-07-16 ENCOUNTER — Ambulatory Visit (INDEPENDENT_AMBULATORY_CARE_PROVIDER_SITE_OTHER): Payer: Medicare Other | Admitting: Internal Medicine

## 2019-07-16 ENCOUNTER — Other Ambulatory Visit (INDEPENDENT_AMBULATORY_CARE_PROVIDER_SITE_OTHER): Payer: Medicare Other

## 2019-07-16 ENCOUNTER — Ambulatory Visit (INDEPENDENT_AMBULATORY_CARE_PROVIDER_SITE_OTHER)
Admission: RE | Admit: 2019-07-16 | Discharge: 2019-07-16 | Disposition: A | Discharge status: Home / Self Care | Payer: Medicare Other | Source: Ambulatory Visit | Attending: Internal Medicine | Admitting: Internal Medicine

## 2019-07-16 ENCOUNTER — Other Ambulatory Visit: Payer: Self-pay | Admitting: Internal Medicine

## 2019-07-16 ENCOUNTER — Other Ambulatory Visit: Payer: Self-pay

## 2019-07-16 VITALS — BP 130/84 | HR 64 | Temp 98.1°F | Ht 66.0 in | Wt 150.0 lb

## 2019-07-16 DIAGNOSIS — G8929 Other chronic pain: Secondary | ICD-10-CM

## 2019-07-16 DIAGNOSIS — E119 Type 2 diabetes mellitus without complications: Secondary | ICD-10-CM

## 2019-07-16 DIAGNOSIS — Z23 Encounter for immunization: Secondary | ICD-10-CM

## 2019-07-16 DIAGNOSIS — M25551 Pain in right hip: Secondary | ICD-10-CM | POA: Diagnosis not present

## 2019-07-16 DIAGNOSIS — R1031 Right lower quadrant pain: Secondary | ICD-10-CM

## 2019-07-16 DIAGNOSIS — I10 Essential (primary) hypertension: Secondary | ICD-10-CM

## 2019-07-16 LAB — COMPREHENSIVE METABOLIC PANEL
ALT: 6 U/L (ref 0–35)
AST: 15 U/L (ref 0–37)
Albumin: 4 g/dL (ref 3.5–5.2)
Alkaline Phosphatase: 99 U/L (ref 39–117)
BUN: 23 mg/dL (ref 6–23)
CO2: 29 mEq/L (ref 19–32)
Calcium: 9.5 mg/dL (ref 8.4–10.5)
Chloride: 107 mEq/L (ref 96–112)
Creatinine, Ser: 1.18 mg/dL (ref 0.40–1.20)
GFR: 53.15 mL/min — ABNORMAL LOW (ref >60.00)
Glucose, Bld: 153 mg/dL — ABNORMAL HIGH (ref 70–99)
Potassium: 3.7 mEq/L (ref 3.5–5.1)
Sodium: 143 mEq/L (ref 135–145)
Total Bilirubin: 0.5 mg/dL (ref 0.2–1.2)
Total Protein: 6.9 g/dL (ref 6.0–8.3)

## 2019-07-16 LAB — LIPASE: Lipase: 57 U/L (ref 11.0–59.0)

## 2019-07-16 LAB — LIPID PANEL
Cholesterol: 117 mg/dL (ref 0–200)
HDL: 49.8 mg/dL (ref >39.00)
LDL Cholesterol: 54 mg/dL (ref 0–99)
NonHDL: 67.39
Total CHOL/HDL Ratio: 2
Triglycerides: 65 mg/dL (ref 0.0–149.0)
VLDL: 13 mg/dL (ref 0.0–40.0)

## 2019-07-16 LAB — CBC
HCT: 39.5 % (ref 36.0–46.0)
Hemoglobin: 13 g/dL (ref 12.0–15.0)
MCHC: 33 g/dL (ref 30.0–36.0)
MCV: 95 fl (ref 78.0–100.0)
Platelets: 187 10*3/uL (ref 150.0–400.0)
RBC: 4.15 Mil/uL (ref 3.87–5.11)
RDW: 13.5 % (ref 11.5–15.5)
WBC: 8 10*3/uL (ref 4.0–10.5)

## 2019-07-16 LAB — HEMOGLOBIN A1C: Hgb A1c MFr Bld: 7 % — ABNORMAL HIGH (ref 4.6–6.5)

## 2019-07-16 NOTE — Assessment & Plan Note (Signed)
Checking CMP and adjust regimen as needed amlodipine and disopyramide and metoprolol.

## 2019-07-16 NOTE — Telephone Encounter (Signed)
I'll forward to [place] to see if a prior authorization or non formulary exception form will help with coverage.

## 2019-07-16 NOTE — Progress Notes (Signed)
   Subjective:   Patient ID: Monique [name], female    DOB: [birth year], 83 y.o.   MRN: [id number]  HPI The patient is an 83 YO female coming in for concerns about blood pressure (denies checking pressure at home, taking her medications amlodipine and metoprolol and disopyramide, denies headaches or chest pains) and diabetes (rare tingling in feet, denies low or high sugars, diet is stable, weight is stable, taking metformin daily and denies side effects or missing this) and RLQ pain (pain in the low abdomen/pelvis right sided, she denies diarrhea or constipation, rare blood in stool with known hemorrhoids, denies nausea or vomiting, denies bloating, denies weight gain/loss, going on for years, unknown how many but at least 5 or 10).   Review of Systems  Constitutional: Negative.   HENT: Negative.   Eyes: Negative.   Respiratory: Negative for cough, chest tightness and shortness of breath.   Cardiovascular: Negative for chest pain, palpitations and leg swelling.  Gastrointestinal: Positive for abdominal pain. Negative for abdominal distention, constipation, diarrhea, nausea and vomiting.  Musculoskeletal: Negative.   Skin: Negative.   Neurological: Negative.   Psychiatric/Behavioral: Negative.     Objective:  Physical Exam Constitutional:      Appearance: She is well-developed.  HENT:     Head: Normocephalic and atraumatic.  Neck:     Musculoskeletal: Normal range of motion.  Cardiovascular:     Rate and Rhythm: Normal rate and regular rhythm.  Pulmonary:     Effort: Pulmonary effort is normal. No respiratory distress.     Breath sounds: Normal breath sounds. No wheezing or rales.  Abdominal:     General: Bowel sounds are normal. There is no distension.     Palpations: Abdomen is soft.     Tenderness: There is abdominal tenderness. There is no rebound.     Comments: Tenderness RLQ over the hip region  Musculoskeletal:        General: No tenderness.  Skin:    General: Skin is  warm and dry.  Neurological:     Mental Status: She is alert and oriented to person, place, and time.     Coordination: Coordination normal.     Vitals:   07/16/19 0828  BP: 130/84  Pulse: 64  Temp: 98.1 F (36.7 C)  TempSrc: Oral  SpO2: 98%  Weight: 150 lb (68 kg)  Height: 5\' 6"  (1.676 m)    Assessment & Plan:  Flu shot given at visit

## 2019-07-16 NOTE — Patient Instructions (Addendum)
We will do the x-ray and the labs today. If we do not find a cause of the pain we will check a CT scan of the stomach to look for a cause of the pain.

## 2019-07-16 NOTE — Telephone Encounter (Signed)
Patient hs been on disopyramide for a long time They want to substitute for amiodarone   I do not want to do this    What do I need to do?

## 2019-07-16 NOTE — Assessment & Plan Note (Signed)
This has been going on for many years (unclear how many). Pain location on exam could be from hip so x-ray hip today and checking labs. If no arthritis then will get CT abdomen/pelvis. She is up to date on colonoscopy with last 2017 and recommendation for no further based on age.

## 2019-07-16 NOTE — Assessment & Plan Note (Signed)
Foot exam done, checking HgA1c and lipid panel. Not on ACE-I or ARB but on statin. Adjust as needed.

## 2019-07-17 NOTE — Telephone Encounter (Signed)
I have started a Disopyramide Non-Formulary PA through covermymeds. Key: [id number]

## 2019-07-22 NOTE — Telephone Encounter (Signed)
Please inform pt we are trying to get medication approved

## 2019-07-23 NOTE — Telephone Encounter (Signed)
**Note De-Identified [place] Obfuscation** Message received from Covermymeds:  Jearld Shines Key: [id number] Outcome  Approved on November 11  Request Reference Number: [id number].  DISOPYRAMIDE CAP 150MG  is approved through 09/04/2020.   I contacted [facility] and made them aware of this approval. Per pharmacist Disopyramide will now cost the pt $106.47 for a 90 day supply.  [facility] will [facility] and notify the pt when ready for pick up.

## 2019-07-23 NOTE — Telephone Encounter (Signed)
Called patient and informed of approval through next December and cost for 90 day supply.  Pt very pleased to hear information.

## 2019-07-26 ENCOUNTER — Other Ambulatory Visit: Payer: Self-pay | Admitting: Internal Medicine

## 2019-07-31 ENCOUNTER — Ambulatory Visit
Admission: RE | Admit: 2019-07-31 | Discharge: 2019-07-31 | Disposition: A | Discharge status: Home / Self Care | Payer: Medicare Other | Source: Ambulatory Visit | Attending: Internal Medicine | Admitting: Internal Medicine

## 2019-07-31 DIAGNOSIS — N200 Calculus of kidney: Secondary | ICD-10-CM | POA: Diagnosis not present

## 2019-07-31 DIAGNOSIS — N2889 Other specified disorders of kidney and ureter: Secondary | ICD-10-CM | POA: Diagnosis not present

## 2019-07-31 DIAGNOSIS — I700 Atherosclerosis of aorta: Secondary | ICD-10-CM | POA: Diagnosis not present

## 2019-07-31 DIAGNOSIS — R1031 Right lower quadrant pain: Secondary | ICD-10-CM

## 2019-07-31 MED ORDER — IOPAMIDOL (ISOVUE-300) INJECTION 61%
100.0000 mL | Freq: Once | INTRAVENOUS | Status: AC | PRN
  Administered 2019-07-31: 100 mL via INTRAVENOUS

## 2019-08-03 ENCOUNTER — Other Ambulatory Visit: Payer: Self-pay | Admitting: Internal Medicine

## 2019-08-05 ENCOUNTER — Telehealth: Payer: Self-pay | Admitting: Internal Medicine

## 2019-08-05 ENCOUNTER — Other Ambulatory Visit: Payer: Self-pay | Admitting: Internal Medicine

## 2019-08-05 DIAGNOSIS — N200 Calculus of kidney: Secondary | ICD-10-CM

## 2019-08-05 NOTE — Telephone Encounter (Signed)
I have tried twice to reach pt but call does not ring and then goes to busy signal

## 2019-08-05 NOTE — Telephone Encounter (Signed)
I spoke with pt and gave her information from Dr Harrington Challenger. Someone is coming over to check her BP and pulse later today.  Pt reports she is feeling fine now. She will call us back if she has any more spells so monitor can be arranged.

## 2019-08-05 NOTE — Telephone Encounter (Signed)
Reviewed msg Sounds like pt had atrial fibrillation   She is on anticoagulation If she continues to have spells would set up for event monitor 3 wk

## 2019-08-05 NOTE — Telephone Encounter (Signed)
New Message    STAT if HR is under 50 or over 120 (normal HR is 60-100 beats per minute)  1) What is your heart rate? Last night 120-122 then went down to 90s then go back up   Has not checked it this morning   2) Do you have a log of your heart rate readings (document readings)? No   3) Do you have any other symptoms? Pt says she has had some pain in her left arm and down into her hands

## 2019-08-05 NOTE — Telephone Encounter (Addendum)
Spoke with Monique [name] who states last night she had a period of what she describes as "nervousness"  She had a family member come over and check her B/P and pulse rate.  Monique [name] states she was told her B/P was normal but her HR was between 90 and 122.  Monique [name] reports she had some pain in her left arm and hand that has mostly resolved. Denies current CP or SOB.  Monique [name] states she is feeling better this morning.  Has not  eaten or taken meds yet this am but will be taking soon.  Monique [name] reports she has been taking medications as prescribed.  Monique [name] plans to have family member come back and check her B/P and pulse later today but unsure what time. Monique [name] advised to contact heart care office with her B/P and pulse readings when taken this afternoon.  Will route to Dr Harrington Challenger and her nurse this am Enis Slipper for further advisement.

## 2019-08-09 ENCOUNTER — Other Ambulatory Visit: Payer: Self-pay | Admitting: Internal Medicine

## 2019-08-16 ENCOUNTER — Other Ambulatory Visit (INDEPENDENT_AMBULATORY_CARE_PROVIDER_SITE_OTHER): Payer: Medicare Other

## 2019-08-16 ENCOUNTER — Ambulatory Visit: Payer: Self-pay

## 2019-08-16 ENCOUNTER — Other Ambulatory Visit: Payer: Self-pay

## 2019-08-16 ENCOUNTER — Encounter: Payer: Self-pay | Admitting: Internal Medicine

## 2019-08-16 ENCOUNTER — Ambulatory Visit (INDEPENDENT_AMBULATORY_CARE_PROVIDER_SITE_OTHER): Payer: Medicare Other | Admitting: Internal Medicine

## 2019-08-16 VITALS — BP 122/84 | HR 59 | Temp 97.7°F | Ht 66.0 in | Wt 148.0 lb

## 2019-08-16 DIAGNOSIS — I480 Paroxysmal atrial fibrillation: Secondary | ICD-10-CM | POA: Diagnosis not present

## 2019-08-16 DIAGNOSIS — R0789 Other chest pain: Secondary | ICD-10-CM | POA: Diagnosis not present

## 2019-08-16 DIAGNOSIS — R002 Palpitations: Secondary | ICD-10-CM

## 2019-08-16 LAB — COMPREHENSIVE METABOLIC PANEL
ALT: 6 U/L (ref 0–35)
AST: 15 U/L (ref 0–37)
Albumin: 4.2 g/dL (ref 3.5–5.2)
Alkaline Phosphatase: 101 U/L (ref 39–117)
BUN: 22 mg/dL (ref 6–23)
CO2: 29 mEq/L (ref 19–32)
Calcium: 10.2 mg/dL (ref 8.4–10.5)
Chloride: 106 mEq/L (ref 96–112)
Creatinine, Ser: 1.53 mg/dL — ABNORMAL HIGH (ref 0.40–1.20)
GFR: 39.38 mL/min — ABNORMAL LOW (ref >60.00)
Glucose, Bld: 87 mg/dL (ref 70–99)
Potassium: 4 mEq/L (ref 3.5–5.1)
Sodium: 143 mEq/L (ref 135–145)
Total Bilirubin: 0.6 mg/dL (ref 0.2–1.2)
Total Protein: 7.6 g/dL (ref 6.0–8.3)

## 2019-08-16 LAB — TROPONIN I (HIGH SENSITIVITY): High Sens Troponin I: 13 ng/L (ref 2–17)

## 2019-08-16 LAB — CBC
HCT: 43.1 % (ref 36.0–46.0)
Hemoglobin: 14.1 g/dL (ref 12.0–15.0)
MCHC: 32.8 g/dL (ref 30.0–36.0)
MCV: 95 fl (ref 78.0–100.0)
Platelets: 193 10*3/uL (ref 150.0–400.0)
RBC: 4.54 Mil/uL (ref 3.87–5.11)
RDW: 13.7 % (ref 11.5–15.5)
WBC: 9.2 10*3/uL (ref 4.0–10.5)

## 2019-08-16 LAB — BRAIN NATRIURETIC PEPTIDE: Pro B Natriuretic peptide (BNP): 227 pg/mL — ABNORMAL HIGH (ref 0.0–100.0)

## 2019-08-16 NOTE — Patient Instructions (Signed)
We will communicate with Dr. Harrington Challenger and see if you need a stress test.   We are checking labs today.

## 2019-08-16 NOTE — Progress Notes (Signed)
   Subjective:   Patient ID: Monique [name], female    DOB: 05-20-1936, 83 y.o.   MRN: [id number]  HPI The patient is an 83 YO female coming in for chest tightness and palpitations. Started a couple week or so ago with an episode. She is having an episode today. Called her cardiologist and they are not able to see her until end of December and she did not want to wait. Lasting for 5-10 minutes when starting. Associated with walking and even walking to the mailbox recently will cause her to have some SOB and chest tighteness. Has not tried nitro for this. Denies fevers or chills or headaches or sinus congestion. Denies missing xarelto or any of her other medications. Some increased stress with loss of sister to colon cancer recently but no active stress event prior to onset of symptoms. Has tried nothing but rest which alleviates symptoms. Feels like heart racing during episodes. Overall they happen most but not all of the time with walking to mailbox distance or more. Cath 09 without intervention, last stress 2016 normal, echo 2019.   PMH, [facility], social history reviewed and updated.  Review of Systems  Constitutional: Negative.   HENT: Negative.   Eyes: Negative.   Respiratory: Positive for chest tightness and shortness of breath. Negative for cough.   Cardiovascular: Positive for chest pain and palpitations. Negative for leg swelling.  Gastrointestinal: Negative for abdominal distention, abdominal pain, constipation, diarrhea, nausea and vomiting.  Musculoskeletal: Negative.   Skin: Negative.   Neurological: Negative.   Psychiatric/Behavioral: Negative.     Objective:  Physical Exam Constitutional:      Appearance: She is well-developed.  HENT:     Head: Normocephalic and atraumatic.  Cardiovascular:     Rate and Rhythm: Normal rate and regular rhythm.  Pulmonary:     Effort: Pulmonary effort is normal. No respiratory distress.     Breath sounds: Normal breath sounds. No wheezing or  rales.  Abdominal:     General: Bowel sounds are normal. There is no distension.     Palpations: Abdomen is soft.     Tenderness: There is no abdominal tenderness. There is no rebound.  Musculoskeletal:     Cervical back: Normal range of motion.  Skin:    General: Skin is warm and dry.  Neurological:     Mental Status: She is alert and oriented to person, place, and time.     Coordination: Coordination normal.     Vitals:   08/16/19 1255  BP: 122/84  Pulse: (!) 59  Temp: 97.7 F (36.5 C)  TempSrc: Oral  SpO2: 99%  Weight: 148 lb (67.1 kg)  Height: 5\' 6"  (1.676 m)   EKG: Rate 59, axis left, LVH, interval normal, sinus brady, st depression not new, no significant change from 04/2019.   This visit occurred during the SARS-CoV-2 public health emergency.  Safety protocols were in place, including screening questions prior to the visit, additional usage of staff PPE, and extensive cleaning of exam room while observing appropriate contact time as indicated for disinfecting solutions.   Assessment & Plan:

## 2019-08-16 NOTE — Assessment & Plan Note (Signed)
Concerning for new anginal symptoms. Stable. EKG without changes from prior in the office today. Last cath 2009, echo 2019. Will let her cardiologist know and may need stress testing. Checking BNP and troponin today as well as CBC and CMP to rule out more acute etiology.

## 2019-08-16 NOTE — Telephone Encounter (Signed)
  Pt. Reports she started having chest "tightness last Sunday - comes and goes. Last about 5 minutes." Reports she talked to Dr. Harrington Challenger "and I have an appointment for 09/05/19. I want to see if I can see Dr. Sharlet Salina." No chest pain today. "My daughter keeps a check on my pulse rate." Warm transfer to [facility] in the practice for a visit. Answer Assessment - Initial Assessment Questions 1. LOCATION: "Where does it hurt?"       Hurts in the middle and to the left 2. RADIATION: "Does the pain go anywhere else?" (e.g., into neck, jaw, arms, back)     Left arm 3. ONSET: "When did the chest pain begin?" (Minutes, hours or days)      Last Sunday 4. PATTERN "Does the pain come and go, or has it been constant since it started?"  "Does it get worse with exertion?"      Comes and goes 5. DURATION: "How long does it last" (e.g., seconds, minutes, hours)     5 minutes 6. SEVERITY: "How bad is the pain?"  (e.g., Scale 1-10; mild, moderate, or severe)    - MILD (1-3): doesn't interfere with normal activities     - MODERATE (4-7): interferes with normal activities or awakens from sleep    - SEVERE (8-10): excruciating pain, unable to do any normal activities       No pain now 7. CARDIAC RISK FACTORS: "Do you have any history of heart problems or risk factors for heart disease?" (e.g., angina, prior heart attack; diabetes, high blood pressure, high cholesterol, smoker, or strong family history of heart disease)     A-fib 8. PULMONARY RISK FACTORS: "Do you have any history of lung disease?"  (e.g., blood clots in lung, asthma, emphysema, birth control pills)     Yes - PE 2015 9. CAUSE: "What do you think is causing the chest pain?"     Unsure 10. OTHER SYMPTOMS: "Do you have any other symptoms?" (e.g., dizziness, nausea, vomiting, sweating, fever, difficulty breathing, cough)       Shortness of breath when she moves around 11. PREGNANCY: "Is there any chance you are pregnant?" "When was your last menstrual  period?"       No  Protocols used: CHEST PAIN-A-AH

## 2019-08-16 NOTE — Assessment & Plan Note (Signed)
In sinus today, we talked about that this does not mean episodes could not be a fib but less likely with current symptoms today and normal EKG. Taking xarelto and rate controlled if a fib.

## 2019-08-16 NOTE — Telephone Encounter (Signed)
Noted thanks °

## 2019-08-21 ENCOUNTER — Inpatient Hospital Stay (HOSPITAL_COMMUNITY)
Admission: EM | Admit: 2019-08-21 | Discharge: 2019-08-23 | DRG: 313 | Disposition: A | Discharge status: Home / Self Care | Payer: Medicare Other | Attending: Internal Medicine | Admitting: Internal Medicine

## 2019-08-21 ENCOUNTER — Ambulatory Visit: Payer: Self-pay | Admitting: *Deleted

## 2019-08-21 ENCOUNTER — Emergency Department (HOSPITAL_COMMUNITY): Payer: Medicare Other

## 2019-08-21 ENCOUNTER — Other Ambulatory Visit: Payer: Self-pay

## 2019-08-21 ENCOUNTER — Encounter (HOSPITAL_COMMUNITY): Payer: Self-pay

## 2019-08-21 DIAGNOSIS — I5032 Chronic diastolic (congestive) heart failure: Secondary | ICD-10-CM | POA: Diagnosis not present

## 2019-08-21 DIAGNOSIS — I200 Unstable angina: Secondary | ICD-10-CM | POA: Diagnosis not present

## 2019-08-21 DIAGNOSIS — Z8249 Family history of ischemic heart disease and other diseases of the circulatory system: Secondary | ICD-10-CM | POA: Diagnosis not present

## 2019-08-21 DIAGNOSIS — N183 Chronic kidney disease, stage 3 unspecified: Secondary | ICD-10-CM | POA: Diagnosis present

## 2019-08-21 DIAGNOSIS — N179 Acute kidney failure, unspecified: Secondary | ICD-10-CM | POA: Diagnosis not present

## 2019-08-21 DIAGNOSIS — Z803 Family history of malignant neoplasm of breast: Secondary | ICD-10-CM | POA: Diagnosis not present

## 2019-08-21 DIAGNOSIS — R079 Chest pain, unspecified: Secondary | ICD-10-CM | POA: Diagnosis not present

## 2019-08-21 DIAGNOSIS — Z87891 Personal history of nicotine dependence: Secondary | ICD-10-CM

## 2019-08-21 DIAGNOSIS — E1122 Type 2 diabetes mellitus with diabetic chronic kidney disease: Secondary | ICD-10-CM | POA: Diagnosis not present

## 2019-08-21 DIAGNOSIS — I493 Ventricular premature depolarization: Secondary | ICD-10-CM | POA: Diagnosis not present

## 2019-08-21 DIAGNOSIS — Z823 Family history of stroke: Secondary | ICD-10-CM

## 2019-08-21 DIAGNOSIS — M810 Age-related osteoporosis without current pathological fracture: Secondary | ICD-10-CM | POA: Diagnosis not present

## 2019-08-21 DIAGNOSIS — R0789 Other chest pain: Principal | ICD-10-CM | POA: Diagnosis present

## 2019-08-21 DIAGNOSIS — Z87442 Personal history of urinary calculi: Secondary | ICD-10-CM

## 2019-08-21 DIAGNOSIS — I10 Essential (primary) hypertension: Secondary | ICD-10-CM | POA: Diagnosis not present

## 2019-08-21 DIAGNOSIS — Z86711 Personal history of pulmonary embolism: Secondary | ICD-10-CM

## 2019-08-21 DIAGNOSIS — I130 Hypertensive heart and chronic kidney disease with heart failure and stage 1 through stage 4 chronic kidney disease, or unspecified chronic kidney disease: Secondary | ICD-10-CM | POA: Diagnosis not present

## 2019-08-21 DIAGNOSIS — Z7901 Long term (current) use of anticoagulants: Secondary | ICD-10-CM

## 2019-08-21 DIAGNOSIS — Z79899 Other long term (current) drug therapy: Secondary | ICD-10-CM

## 2019-08-21 DIAGNOSIS — I471 Supraventricular tachycardia: Secondary | ICD-10-CM | POA: Diagnosis not present

## 2019-08-21 DIAGNOSIS — I421 Obstructive hypertrophic cardiomyopathy: Secondary | ICD-10-CM | POA: Diagnosis present

## 2019-08-21 DIAGNOSIS — Z86718 Personal history of other venous thrombosis and embolism: Secondary | ICD-10-CM

## 2019-08-21 DIAGNOSIS — E785 Hyperlipidemia, unspecified: Secondary | ICD-10-CM | POA: Diagnosis not present

## 2019-08-21 DIAGNOSIS — I480 Paroxysmal atrial fibrillation: Secondary | ICD-10-CM | POA: Diagnosis not present

## 2019-08-21 DIAGNOSIS — Z7984 Long term (current) use of oral hypoglycemic drugs: Secondary | ICD-10-CM | POA: Diagnosis not present

## 2019-08-21 DIAGNOSIS — Z20828 Contact with and (suspected) exposure to other viral communicable diseases: Secondary | ICD-10-CM | POA: Diagnosis present

## 2019-08-21 DIAGNOSIS — Z90710 Acquired absence of both cervix and uterus: Secondary | ICD-10-CM

## 2019-08-21 DIAGNOSIS — K760 Fatty (change of) liver, not elsewhere classified: Secondary | ICD-10-CM | POA: Diagnosis present

## 2019-08-21 LAB — BASIC METABOLIC PANEL
Anion gap: 8 (ref 5–15)
BUN: 22 mg/dL (ref 8–23)
CO2: 24 mmol/L (ref 22–32)
Calcium: 9.1 mg/dL (ref 8.9–10.3)
Chloride: 106 mmol/L (ref 98–111)
Creatinine, Ser: 1.59 mg/dL — ABNORMAL HIGH (ref 0.44–1.00)
GFR calc Af Amer: 35 mL/min — ABNORMAL LOW (ref >60)
GFR calc non Af Amer: 30 mL/min — ABNORMAL LOW (ref >60)
Glucose, Bld: 180 mg/dL — ABNORMAL HIGH (ref 70–99)
Potassium: 3.5 mmol/L (ref 3.5–5.1)
Sodium: 138 mmol/L (ref 135–145)

## 2019-08-21 LAB — CBC
HCT: 42.9 % (ref 36.0–46.0)
Hemoglobin: 13.6 g/dL (ref 12.0–15.0)
MCH: 30.8 pg (ref 26.0–34.0)
MCHC: 31.7 g/dL (ref 30.0–36.0)
MCV: 97.3 fL (ref 80.0–100.0)
Platelets: 187 10*3/uL (ref 150–400)
RBC: 4.41 MIL/uL (ref 3.87–5.11)
RDW: 12.9 % (ref 11.5–15.5)
WBC: 9.8 10*3/uL (ref 4.0–10.5)
nRBC: 0 % (ref 0.0–0.2)

## 2019-08-21 LAB — TROPONIN I (HIGH SENSITIVITY)
Troponin I (High Sensitivity): 13 ng/L (ref <18)
Troponin I (High Sensitivity): 14 ng/L (ref <18)

## 2019-08-21 MED ORDER — ALUM & MAG HYDROXIDE-SIMETH 200-200-20 MG/5ML PO SUSP
30.0000 mL | Freq: Once | ORAL | Status: AC
  Administered 2019-08-21: 30 mL via ORAL
  Filled 2019-08-21: qty 30

## 2019-08-21 MED ORDER — NITROGLYCERIN 0.4 MG SL SUBL
0.4000 mg | SUBLINGUAL_TABLET | SUBLINGUAL | Status: DC | PRN
  Filled 2019-08-21: qty 1

## 2019-08-21 MED ORDER — ASPIRIN 81 MG PO CHEW
324.0000 mg | CHEWABLE_TABLET | Freq: Once | ORAL | Status: AC
  Administered 2019-08-21: 324 mg via ORAL
  Filled 2019-08-21: qty 4

## 2019-08-21 MED ORDER — LIDOCAINE VISCOUS HCL 2 % MT SOLN
15.0000 mL | Freq: Once | OROMUCOSAL | Status: AC
  Administered 2019-08-21: 15 mL via ORAL
  Filled 2019-08-21: qty 15

## 2019-08-21 MED ORDER — NITROGLYCERIN 2 % TD OINT
0.5000 [in_us] | TOPICAL_OINTMENT | Freq: Once | TRANSDERMAL | Status: AC
  Administered 2019-08-21: 0.5 [in_us] via TOPICAL
  Filled 2019-08-21: qty 1

## 2019-08-21 NOTE — ED Notes (Signed)
Patient transported to X-ray 

## 2019-08-21 NOTE — ED Triage Notes (Signed)
Pt arrives POV for eval of intermittent chest pain since Monday. Pt reports this AM it returned as has been persistent since. Reports intermittent radiation to L arm, denies associated SOB/N/V/diaphoresis.NAD in triage

## 2019-08-21 NOTE — ED Provider Notes (Addendum)
[facility] EMERGENCY DEPARTMENT Provider Note   CSN: [id number] Arrival date & time: 08/21/19  1747     History Chief Complaint  Patient presents with  . Chest Pain    Monique [name] is a 83 y.o. female.  Pt presents to the ED today with intermittent cp for the past 1.5 weeks.  She did see her pcp on 12/11 for the pain.  She had labs then which showed nothing acute.  Her pcp was going to get in touch with her cardiologist.  Pt said she had no pain yesterday, but had more pain today.  She said it felt like indigestion, so she took a dose of her protonix which did help.  No sob.  No n/c.  No f/c.  No known covid exposures.  She has a hx of PE and is on Xarelto.  No sob.        Past Medical History:  Diagnosis Date  . Abdominal pain 11/18/2015  . Acute respiratory failure with hypoxia ([place]) 06/27/2014  . Breast pain, left 10/11/2016  . Cerebral aneurysm 2000 06/27/2014  . Chest pain 10/30/2014  . CHF (congestive heart failure) ([place])   . Chronic anticoagulation 06/2014   Coumadin started after bilateral PE  . Chronic kidney disease, stage III (moderate) 11/21/2014  . Clotting disorder ([place])    right was removed, still have left cataract  . Diabetes ([place])   . Dizziness and giddiness 11/28/2014  . DVT of lower extremity (deep venous thrombosis) ([place]) 11/28/2014  . Dysuria 07/07/2014  . Elevated troponin 11/28/2014  . Fatty liver   . Gastritis 05/16/2012   Chronic dyspepsia on PPI daily.   Marland Kitchen Headache, acute 06/08/2010   Qualifier: Diagnosis of  By: Linda Hedges MD, Heinz Knuckles   . Heart murmur   . Hematochezia 11/18/2015  . History of fibromuscular dysplasia 06/27/2014  . HTN (hypertension)    Essential  . Hyperlipidemia   . Hypertrophic obstructive cardiomyopathy (HOCM) ([place]) 05/20/2008   Qualifier: Diagnosis of  By: Linda Hedges MD, Heinz Knuckles   . Kidney stones   . LVH (left ventricular hypertrophy)   . Orthostatic hypotension 11/28/2014  . PAF (paroxysmal atrial  fibrillation) ([place]) 2000  . Palpitations 11/28/2014  . PAROXYSMAL ATRIAL FIBRILLATION 05/20/2008   Qualifier: Diagnosis of  By: Linda Hedges MD, Heinz Knuckles   . PE (pulmonary embolism) 06/2014   Bilateral  . Postmenopausal osteoporosis 04/2019   T score -3.3  . Pulmonary embolism, bilateral ([place]) 06/27/2014  . SOB (shortness of breath) 06/27/2014  . Subarachnoid hemorrhage ([place]) 2000  . UTI (urinary tract infection) 01/04/2015    Patient Active Problem List   Diagnosis Date Noted  . Blood in stool 01/04/2018  . Diabetes mellitus type 2, controlled, without complications ([place]) 123456  . Allergic rhinitis 06/20/2017  . Headache 10/23/2015  . Chronic RLQ pain 04/24/2015  . Chest tightness 11/28/2014  . Chronic kidney disease, stage III (moderate) 11/21/2014  . Pulmonary embolism, bilateral ([place]) 06/27/2014  . History of fibromuscular dysplasia 06/27/2014  . Cerebral aneurysm 2000 06/27/2014  . Hyperlipidemia 05/20/2008  . Essential hypertension 05/20/2008  . Hypertrophic obstructive cardiomyopathy (HOCM) ([place]) 05/20/2008  . PAROXYSMAL ATRIAL FIBRILLATION 05/20/2008  . Subarachnoid hemorrhage 2000 05/20/2008    Past Surgical History:  Procedure Laterality Date  . ABDOMINAL HYSTERECTOMY  2000  . Brain hemorrage     2000  . CARDIAC CATHETERIZATION  05/2008   Nl cors, no gradient, EF 60%     OB History  Gravida  5   Para  4   Term      Preterm      AB  1   Living  3     SAB  1   TAB      Ectopic      Multiple      Live Births              Family History  Problem Relation Age of Onset  . Breast cancer Mother 23       Deceased  . Stroke Mother   . Arrhythmia Mother   . Heart disease Mother   . Arrhythmia Sister   . Breast cancer Daughter 72  . Colon cancer Neg Hx   . Esophageal cancer Neg Hx   . Pancreatic cancer Neg Hx   . Rectal cancer Neg Hx   . Stomach cancer Neg Hx     Social History   Tobacco Use  . Smoking status: Former Smoker     Quit date: 09/06/1967    Years since quitting: 51.9  . Smokeless tobacco: Never Used  Substance Use Topics  . Alcohol use: No    Alcohol/week: 0.0 standard drinks  . Drug use: No    Home Medications Prior to Admission medications   Medication Sig Start Date End Date Taking? Authorizing Provider  acetaminophen (TYLENOL) 500 MG tablet Take 1,000 mg by mouth.    [provider]  amLODipine (NORVASC) 5 MG tablet TAKE 1 TABLET BY MOUTH 2 TIMES A DAY 07/26/19   Fay Records, MD  atorvastatin (LIPITOR) 20 MG tablet TAKE 1 TABLET (20 MG TOTAL) BY MOUTH DAILY AT 6 PM. 08/05/19   Burns, Claudina Lick, MD  disopyramide (NORPACE) 150 MG capsule TAKE 1 CAPSULE (150 MG TOTAL) BY MOUTH 2 (TWO) TIMES DAILY. 07/17/19   Fay Records, MD  metFORMIN (GLUCOPHAGE) 500 MG tablet TAKE 1 TABLET (500 MG TOTAL) BY MOUTH DAILY WITH BREAKFAST. 04/19/19   Hoyt Koch, MD  metoprolol tartrate (LOPRESSOR) 25 MG tablet TAKE 1 TABLET BY MOUTH 2 TIMES A DAY 07/26/19   Fay Records, MD  mirtazapine (REMERON) 15 MG tablet TAKE 1 TABLET BY MOUTH EVERYDAY AT BEDTIME 08/09/19   Hoyt Koch, MD  montelukast (SINGULAIR) 10 MG tablet Take 1 tablet (10 mg total) by mouth at bedtime. 06/20/17   Hoyt Koch, MD  nitroGLYCERIN (NITROSTAT) 0.4 MG SL tablet PLACE 1 TABLET (0.4 MG TOTAL) UNDER THE TONGUE EVERY 5 (FIVE) MINUTES AS NEEDED FOR CHEST PAIN. 10/01/18   Bhagat, Crista Luria, PA  pantoprazole (PROTONIX) 40 MG tablet TAKE 1 TABLET BY MOUTH EVERY DAY 08/09/19   Hoyt Koch, MD  sertraline (ZOLOFT) 50 MG tablet TAKE 1 TABLET (50 MG TOTAL) BY MOUTH DAILY. 02/24/15   Hoyt Koch, MD  XARELTO 15 MG TABS tablet TAKE 1 TABLET BY MOUTH EVERY DAY WITH SUPPER 04/01/19   Fay Records, MD    Allergies    Patient has no known allergies.  Review of Systems   Review of Systems  Cardiovascular: Positive for chest pain.  All other systems reviewed and are negative.   Physical Exam Updated  Vital Signs BP 135/85   Pulse 71   Temp 97.8 F (36.6 C) (Oral)   Resp 13   Ht 5\' 6"  (1.676 m)   Wt 67.1 kg   SpO2 98%   BMI 23.89 kg/m   Physical Exam Vitals and nursing note reviewed.  Constitutional:  Appearance: She is well-developed.  HENT:     Head: Normocephalic and atraumatic.  Eyes:     Extraocular Movements: Extraocular movements intact.     Pupils: Pupils are equal, round, and reactive to light.  Cardiovascular:     Rate and Rhythm: Normal rate and regular rhythm.     Heart sounds: Normal heart sounds.  Pulmonary:     Effort: Pulmonary effort is normal.     Breath sounds: Normal breath sounds.  Abdominal:     General: Bowel sounds are normal.     Palpations: Abdomen is soft.  Musculoskeletal:        General: Normal range of motion.     Cervical back: Normal range of motion and neck supple.  Skin:    General: Skin is warm.     Capillary Refill: Capillary refill takes less than 2 seconds.  Neurological:     General: No focal deficit present.     Mental Status: She is alert and oriented to person, place, and time.  Psychiatric:        Mood and Affect: Mood normal.        Behavior: Behavior normal.     ED Results / Procedures / Treatments   Labs (all labs ordered are listed, but only abnormal results are displayed) Labs Reviewed  BASIC METABOLIC PANEL - Abnormal; Notable for the following components:      Result Value   Glucose, Bld 180 (*)    Creatinine, Ser 1.59 (*)    GFR calc non Af Amer 30 (*)    GFR calc Af Amer 35 (*)    All other components within normal limits  SARS CORONAVIRUS 2 (TAT 6-24 HRS)  CBC  TROPONIN I (HIGH SENSITIVITY)  TROPONIN I (HIGH SENSITIVITY)    EKG EKG Interpretation  Date/Time:  Wednesday August 21 2019 17:56:43 EST Ventricular Rate:  69 PR Interval:  198 QRS Duration: 86 QT Interval:  466 QTC Calculation: 499 R Axis:   -50 Text Interpretation: Sinus rhythm with frequent Premature ventricular complexes  Left anterior fascicular block Left ventricular hypertrophy with repolarization abnormality ( R in aVL , Cornell product , Romhilt-[name] ) Prolonged QT Abnormal ECG Confirmed by Isla Pence [phone]) on 08/21/2019 6:32:23 PM   Radiology DG Chest 2 View  Result Date: 08/21/2019 CLINICAL DATA:  Chest pain EXAM: CHEST - 2 VIEW COMPARISON:  09/07/2017 FINDINGS: The heart size and mediastinal contours are stable. Tortuosity of the thoracic aorta. Lungs are mildly hyperinflated. No focal airspace consolidation, pleural effusion, or pneumothorax. The visualized skeletal structures are unremarkable. IMPRESSION: No active cardiopulmonary disease. Electronically Signed   By: Davina Poke M.D.   On: 08/21/2019 19:15    Procedures Procedures (including critical care time)  Medications Ordered in ED Medications  nitroGLYCERIN (NITROSTAT) SL tablet 0.4 mg (has no administration in time range)  nitroGLYCERIN (NITROGLYN) 2 % ointment 0.5 inch (has no administration in time range)  alum & mag hydroxide-simeth (MAALOX/MYLANTA) 200-200-20 MG/5ML suspension 30 mL (30 mLs Oral Given 08/21/19 1854)    And  lidocaine (XYLOCAINE) 2 % viscous mouth solution 15 mL (15 mLs Oral Given 08/21/19 1853)  aspirin chewable tablet 324 mg (324 mg Oral Given 08/21/19 1953)    ED Course  I have reviewed the triage vital signs and the nursing notes.  Pertinent labs & imaging results that were available during my care of the patient were reviewed by me and considered in my medical decision making (see chart for details).  MDM Rules/Calculators/A&P                     Pain is slightly better with gi cocktail, but she still has a pressure.  She is given nitro which also helped a little, but it is not gone.  I think the safest thing to do is to observe overnight and see how she does.   Pt d/w Dr. Marthenia Rolling (triad) for obs.  CRITICAL CARE Performed by: Isla Pence   Total critical care time: 30  minutes  Critical care time was exclusive of separately billable procedures and treating other patients.  Critical care was necessary to treat or prevent imminent or life-threatening deterioration.  Critical care was time spent personally by me on the following activities: development of treatment plan with patient and/or surrogate as well as nursing, discussions with consultants, evaluation of patient's response to treatment, examination of patient, obtaining history from patient or surrogate, ordering and performing treatments and interventions, ordering and review of laboratory studies, ordering and review of radiographic studies, pulse oximetry and re-evaluation of patient's condition.    Final Clinical Impression(s) / ED Diagnoses Final diagnoses:  Unstable angina ([place])  PVC (premature ventricular contraction)    Rx / DC Orders ED Discharge Orders    None       Isla Pence, MD 08/21/19 2148    Isla Pence, MD 09/09/19 1032

## 2019-08-21 NOTE — H&P (Signed)
History and Physical  Monique [name] [id number] DOB: May 12, 1936 DOA: 08/21/2019  Referring physician: ER provider PCP: Hoyt Koch, MD  Outpatient Specialists: Cardiologist, Dr. Harrington Challenger Patient coming from: Home  Chief Complaint: Chest pain (lower sternal chest pain)  HPI:  Patient is an 83 year old female with past medical history significant for subarachnoid hemorrhage secondary to cerebral aneurysm several years ago, pulmonary embolism and DVT of lower extremity for which patient takes Xarelto, paroxysmal atrial fibrillation, hypertrophic cardiomyopathy, hypertension, hyperlipidemia, diabetes mellitus, chronic kidney disease stage III and congestive heart failure.  Patient presents with 10-day history of lower sternal border pain/chest pain.  Pain is easily reproducible.  No associated nausea, shortness of breath no diaphoresis.  High sensitive troponin has been negative on 2 occasions.  The ER provider has called the hospitalist service to admit patient for chest pain.  No headache, no neck pain, no fever or chills, no URI symptoms, no GI symptoms and no urinary symptoms.  ED Course: On presentation to the hospital "his temperature was 97.8, blood pressure of 110-155/72-95 mmHg, heart rate of 62, respiratory rate of 17 and O2 sat of 99%.  Troponins have been negative on 2 occasions.  Pertinent labs: Negative troponins.  EKG: Independently reviewed.   Imaging: independently reviewed.   Review of Systems:  Negative for fever, visual changes, sore throat, rash, new muscle aches, SOB, dysuria, bleeding, n/v/abdominal pain.  Past Medical History:  Diagnosis Date  . Abdominal pain 11/18/2015  . Acute respiratory failure with hypoxia ([place]) 06/27/2014  . Breast pain, left 10/11/2016  . Cerebral aneurysm 2000 06/27/2014  . Chest pain 10/30/2014  . CHF (congestive heart failure) ([place])   . Chronic anticoagulation 06/2014   Coumadin started after bilateral PE  . Chronic kidney  disease, stage III (moderate) 11/21/2014  . Clotting disorder ([place])    right was removed, still have left cataract  . Diabetes ([place])   . Dizziness and giddiness 11/28/2014  . DVT of lower extremity (deep venous thrombosis) ([place]) 11/28/2014  . Dysuria 07/07/2014  . Elevated troponin 11/28/2014  . Fatty liver   . Gastritis 05/16/2012   Chronic dyspepsia on PPI daily.   Marland Kitchen Headache, acute 06/08/2010   Qualifier: Diagnosis of  By: Linda Hedges MD, Heinz Knuckles   . Heart murmur   . Hematochezia 11/18/2015  . History of fibromuscular dysplasia 06/27/2014  . HTN (hypertension)    Essential  . Hyperlipidemia   . Hypertrophic obstructive cardiomyopathy (HOCM) ([place]) 05/20/2008   Qualifier: Diagnosis of  By: Linda Hedges MD, Heinz Knuckles   . Kidney stones   . LVH (left ventricular hypertrophy)   . Orthostatic hypotension 11/28/2014  . PAF (paroxysmal atrial fibrillation) ([place]) 2000  . Palpitations 11/28/2014  . PAROXYSMAL ATRIAL FIBRILLATION 05/20/2008   Qualifier: Diagnosis of  By: Linda Hedges MD, Heinz Knuckles   . PE (pulmonary embolism) 06/2014   Bilateral  . Postmenopausal osteoporosis 04/2019   T score -3.3  . Pulmonary embolism, bilateral ([place]) 06/27/2014  . SOB (shortness of breath) 06/27/2014  . Subarachnoid hemorrhage ([place]) 2000  . UTI (urinary tract infection) 01/04/2015    Past Surgical History:  Procedure Laterality Date  . ABDOMINAL HYSTERECTOMY  2000  . Brain hemorrage     2000  . CARDIAC CATHETERIZATION  05/2008   Nl cors, no gradient, EF 60%     reports that she quit smoking about 51 years ago. She has never used smokeless tobacco. She reports that she does not drink alcohol or use drugs.  No Known  Allergies  Family History  Problem Relation Age of Onset  . Breast cancer Mother 52       Deceased  . Stroke Mother   . Arrhythmia Mother   . Heart disease Mother   . Arrhythmia Sister   . Breast cancer Daughter 90  . Colon cancer Neg Hx   . Esophageal cancer Neg Hx   . Pancreatic cancer Neg Hx     . Rectal cancer Neg Hx   . Stomach cancer Neg Hx      Prior to Admission medications   Medication Sig Start Date End Date Taking? Authorizing Provider  acetaminophen (TYLENOL) 500 MG tablet Take 1,000 mg by mouth.    [provider]  amLODipine (NORVASC) 5 MG tablet TAKE 1 TABLET BY MOUTH 2 TIMES A DAY 07/26/19   Fay Records, MD  atorvastatin (LIPITOR) 20 MG tablet TAKE 1 TABLET (20 MG TOTAL) BY MOUTH DAILY AT 6 PM. 08/05/19   Burns, Claudina Lick, MD  disopyramide (NORPACE) 150 MG capsule TAKE 1 CAPSULE (150 MG TOTAL) BY MOUTH 2 (TWO) TIMES DAILY. 07/17/19   Fay Records, MD  metFORMIN (GLUCOPHAGE) 500 MG tablet TAKE 1 TABLET (500 MG TOTAL) BY MOUTH DAILY WITH BREAKFAST. 04/19/19   Hoyt Koch, MD  metoprolol tartrate (LOPRESSOR) 25 MG tablet TAKE 1 TABLET BY MOUTH 2 TIMES A DAY 07/26/19   Fay Records, MD  mirtazapine (REMERON) 15 MG tablet TAKE 1 TABLET BY MOUTH EVERYDAY AT BEDTIME 08/09/19   Hoyt Koch, MD  montelukast (SINGULAIR) 10 MG tablet Take 1 tablet (10 mg total) by mouth at bedtime. 06/20/17   Hoyt Koch, MD  nitroGLYCERIN (NITROSTAT) 0.4 MG SL tablet PLACE 1 TABLET (0.4 MG TOTAL) UNDER THE TONGUE EVERY 5 (FIVE) MINUTES AS NEEDED FOR CHEST PAIN. 10/01/18   Bhagat, Crista Luria, PA  pantoprazole (PROTONIX) 40 MG tablet TAKE 1 TABLET BY MOUTH EVERY DAY 08/09/19   Hoyt Koch, MD  sertraline (ZOLOFT) 50 MG tablet TAKE 1 TABLET (50 MG TOTAL) BY MOUTH DAILY. 02/24/15   Hoyt Koch, MD  XARELTO 15 MG TABS tablet TAKE 1 TABLET BY MOUTH EVERY DAY WITH SUPPER 04/01/19   Fay Records, MD    Physical Exam: Vitals:   08/21/19 2135 08/21/19 2145 08/21/19 2215 08/21/19 2222  BP:  (!) 155/72 (!) 153/71 125/73  Pulse: 71 66 68 62  Resp: 13 13 16 17   Temp:      TempSrc:      SpO2: 98% 96% 92% 99%  Weight:      Height:        Constitutional:  . Appears calm and comfortable Eyes:  . No pallor. No jaundice.  ENMT:  . external  ears, nose appear normal Neck:  . Neck is supple. No JVD Respiratory:  . CTA bilaterally, no w/r/r.  . Respiratory effort normal. No retractions or accessory muscle use Cardiovascular:  . S1S2.  Reproducible chest pain at the lower sternal border . No LE extremity edema   Abdomen:  . Abdomen is soft and non tender. Organs are difficult to assess. Neurologic:  . Awake and alert. . Moves all limbs.  Wt Readings from Last 3 Encounters:  08/21/19 67.1 kg  08/16/19 67.1 kg  07/16/19 68 kg    I have personally reviewed following labs and imaging studies  Labs on Admission:  CBC: Recent Labs  Lab 08/16/19 1327 08/21/19 1807  WBC 9.2 9.8  HGB 14.1 13.6  HCT 43.1  42.9  MCV 95.0 97.3  PLT 193.0 [id number]   Basic Metabolic Panel: Recent Labs  Lab 08/16/19 1327 08/21/19 1807  NA 143 138  K 4.0 3.5  CL 106 106  CO2 29 24  GLUCOSE 87 180*  BUN 22 22  CREATININE 1.53* 1.59*  CALCIUM 10.2 9.1   Liver Function Tests: Recent Labs  Lab 08/16/19 1327  AST 15  ALT 6  ALKPHOS 101  BILITOT 0.6  PROT 7.6  ALBUMIN 4.2   No results for input(s): LIPASE, AMYLASE in the last 168 hours. No results for input(s): AMMONIA in the last 168 hours. Coagulation Profile: No results for input(s): INR, PROTIME in the last 168 hours. Cardiac Enzymes: No results for input(s): CKTOTAL, CKMB, CKMBINDEX, TROPONINI in the last 168 hours. BNP (last 3 results) Recent Labs    08/16/19 1327  PROBNP 227.0*   HbA1C: No results for input(s): HGBA1C in the last 72 hours. CBG: No results for input(s): GLUCAP in the last 168 hours. Lipid Profile: No results for input(s): CHOL, HDL, LDLCALC, TRIG, CHOLHDL, LDLDIRECT in the last 72 hours. Thyroid Function Tests: No results for input(s): TSH, T4TOTAL, FREET4, T3FREE, THYROIDAB in the last 72 hours. Anemia Panel: No results for input(s): VITAMINB12, FOLATE, FERRITIN, TIBC, IRON, RETICCTPCT in the last 72 hours. Urine analysis:    Component Value  Date/Time   COLORURINE YELLOW 03/26/2019 1547   APPEARANCEUR CLOUDY (A) 03/26/2019 1547   LABSPEC 1.020 03/26/2019 1547   PHURINE < OR = 5.0 03/26/2019 1547   GLUCOSEU NEGATIVE 03/26/2019 1547   GLUCOSEU 100 (A) 05/12/2016 1626   HGBUR TRACE (A) 03/26/2019 1547   HGBUR moderate 12/23/2009 1623   BILIRUBINUR 1+ 02/12/2019 1429   KETONESUR NEGATIVE 03/26/2019 1547   PROTEINUR NEGATIVE 03/26/2019 1547   UROBILINOGEN 0.2 02/12/2019 1429   UROBILINOGEN 0.2 05/12/2016 1626   NITRITE Neg 02/12/2019 1429   NITRITE NEGATIVE 05/12/2016 1626   LEUKOCYTESUR Negative 02/12/2019 1429   Sepsis Labs: @LABRCNTIP (procalcitonin:4,lacticidven:4) )No results found for this or any previous visit (from the past 240 hour(s)).    Radiological Exams on Admission: DG Chest 2 View  Result Date: 08/21/2019 CLINICAL DATA:  Chest pain EXAM: CHEST - 2 VIEW COMPARISON:  09/07/2017 FINDINGS: The heart size and mediastinal contours are stable. Tortuosity of the thoracic aorta. Lungs are mildly hyperinflated. No focal airspace consolidation, pleural effusion, or pneumothorax. The visualized skeletal structures are unremarkable. IMPRESSION: No active cardiopulmonary disease. Electronically Signed   By: Davina Poke M.D.   On: 08/21/2019 19:15    EKG: Independently reviewed.   Active Problems:   Chest pain   Assessment/Plan Chest pain with atypical features: -Possibly musculoskeletal  -Troponins have been negative on 2 occasions -Observe patient -Patient is known to cardiologist, Dr. Harrington Challenger.  Kindly discuss case with Dr. Harrington Challenger in the morning -Further management will depend on hospital course.  Acute kidney injury on chronic kidney disease stage III: -Continue to monitor closely -AKI seems to have plateaued. Rule out progression of chronic kidney disease.  Diabetes mellitus: Sliding scale insulin coverage.  Hypertension: Continue to optimize.  CHF: Stable. History of hypertrophic obstructive  cardiomyopathy  DVT prophylaxis: Xarelto is on hold.  Consider alternative anticoagulation Code Status: Full code Family Communication:  Disposition Plan: Likely discharge back on tomorrow Consults called: Please discuss case with Dr. Harrington Challenger, cardiologist in the morning Admission status: Observation  Time spent: 60 minutes.   Dana Allan, MD  [facility] Pager #: [phone] 7PM-7AM contact night coverage as  above  08/21/2019, 11:53 PM

## 2019-08-21 NOTE — Telephone Encounter (Signed)
Pt called with chest tightness that started this morning and has not eased up. She stated the pain is #8. And it is located in between her breast. She stated that she also feel a little something in the left fingers. She denies shortness of breath, nausea or vomiting, sweating, fever or cough. She took a NTG and did not help.  She saw her provider in the office Friday for chest pain. She takes xalreto daily. She is advised to call 911 per protocol.  Reason for Disposition . [1] Chest pain lasts > 5 minutes AND [2] age > 23 AND [3] one or more cardiac risk factors (e.g., diabetes, high blood pressure, high cholesterol, smoker, or strong family history of heart disease)  Answer Assessment - Initial Assessment Questions 1. LOCATION: "Where does it hurt?"       Mid chest in between breast. 2. RADIATION: "Does the pain go anywhere else?" (e.g., into neck, jaw, arms, back)     Goes to left hand a little bit 3. ONSET: "When did the chest pain begin?" (Minutes, hours or days)      On the Dec 6 th 4. PATTERN "Does the pain come and go, or has it been constant since it started?"  "Does it get worse with exertion?"      Comes and goes, did not have yesterday 5. DURATION: "How long does it last" (e.g., seconds, minutes, hours)     Sometimes during the day 6. SEVERITY: "How bad is the pain?"  (e.g., Scale 1-10; mild, moderate, or severe)    - MILD (1-3): doesn't interfere with normal activities     - MODERATE (4-7): interferes with normal activities or awakens from sleep    - SEVERE (8-10): excruciating pain, unable to do any normal activities       Pain #8 7. CARDIAC RISK FACTORS: "Do you have any history of heart problems or risk factors for heart disease?" (e.g., angina, prior heart attack; diabetes, high blood pressure, high cholesterol, smoker, or strong family history of heart disease)     Hx of HTN, diabetes, hx of heart dx in family 8. PULMONARY RISK FACTORS: "Do you have any history of lung  disease?"  (e.g., blood clots in lung, asthma, emphysema, birth control pills)     Hx of blood clots in 2015,  9. CAUSE: "What do you think is causing the chest pain?"     Not sure 10. OTHER SYMPTOMS: "Do you have any other symptoms?" (e.g., dizziness, nausea, vomiting, sweating, fever, difficulty breathing, cough)       no 11. PREGNANCY: "Is there any chance you are pregnant?" "When was your last menstrual period?"       N/a  Protocols used: CHEST PAIN-A-AH

## 2019-08-21 NOTE — Telephone Encounter (Signed)
Tried calling patient to see if she went to the ED phone kept going to busy signal. Will call tomorrow to check up on patient

## 2019-08-22 DIAGNOSIS — I130 Hypertensive heart and chronic kidney disease with heart failure and stage 1 through stage 4 chronic kidney disease, or unspecified chronic kidney disease: Secondary | ICD-10-CM | POA: Diagnosis present

## 2019-08-22 DIAGNOSIS — I5032 Chronic diastolic (congestive) heart failure: Secondary | ICD-10-CM

## 2019-08-22 DIAGNOSIS — Z7984 Long term (current) use of oral hypoglycemic drugs: Secondary | ICD-10-CM | POA: Diagnosis not present

## 2019-08-22 DIAGNOSIS — N183 Chronic kidney disease, stage 3 unspecified: Secondary | ICD-10-CM

## 2019-08-22 DIAGNOSIS — I471 Supraventricular tachycardia: Secondary | ICD-10-CM | POA: Diagnosis not present

## 2019-08-22 DIAGNOSIS — Z823 Family history of stroke: Secondary | ICD-10-CM | POA: Diagnosis not present

## 2019-08-22 DIAGNOSIS — Z86711 Personal history of pulmonary embolism: Secondary | ICD-10-CM | POA: Diagnosis not present

## 2019-08-22 DIAGNOSIS — N179 Acute kidney failure, unspecified: Secondary | ICD-10-CM | POA: Diagnosis not present

## 2019-08-22 DIAGNOSIS — R079 Chest pain, unspecified: Secondary | ICD-10-CM | POA: Diagnosis not present

## 2019-08-22 DIAGNOSIS — Z20828 Contact with and (suspected) exposure to other viral communicable diseases: Secondary | ICD-10-CM | POA: Diagnosis present

## 2019-08-22 DIAGNOSIS — Z803 Family history of malignant neoplasm of breast: Secondary | ICD-10-CM | POA: Diagnosis not present

## 2019-08-22 DIAGNOSIS — R0789 Other chest pain: Secondary | ICD-10-CM | POA: Diagnosis not present

## 2019-08-22 DIAGNOSIS — Z86718 Personal history of other venous thrombosis and embolism: Secondary | ICD-10-CM | POA: Diagnosis not present

## 2019-08-22 DIAGNOSIS — I421 Obstructive hypertrophic cardiomyopathy: Secondary | ICD-10-CM

## 2019-08-22 DIAGNOSIS — I480 Paroxysmal atrial fibrillation: Secondary | ICD-10-CM | POA: Diagnosis present

## 2019-08-22 DIAGNOSIS — Z8249 Family history of ischemic heart disease and other diseases of the circulatory system: Secondary | ICD-10-CM | POA: Diagnosis not present

## 2019-08-22 DIAGNOSIS — M810 Age-related osteoporosis without current pathological fracture: Secondary | ICD-10-CM | POA: Diagnosis present

## 2019-08-22 DIAGNOSIS — E785 Hyperlipidemia, unspecified: Secondary | ICD-10-CM | POA: Diagnosis present

## 2019-08-22 DIAGNOSIS — Z90710 Acquired absence of both cervix and uterus: Secondary | ICD-10-CM | POA: Diagnosis not present

## 2019-08-22 DIAGNOSIS — Z87891 Personal history of nicotine dependence: Secondary | ICD-10-CM | POA: Diagnosis not present

## 2019-08-22 DIAGNOSIS — E1122 Type 2 diabetes mellitus with diabetic chronic kidney disease: Secondary | ICD-10-CM | POA: Diagnosis present

## 2019-08-22 DIAGNOSIS — Z7901 Long term (current) use of anticoagulants: Secondary | ICD-10-CM | POA: Diagnosis not present

## 2019-08-22 DIAGNOSIS — I493 Ventricular premature depolarization: Secondary | ICD-10-CM | POA: Diagnosis present

## 2019-08-22 DIAGNOSIS — Z87442 Personal history of urinary calculi: Secondary | ICD-10-CM | POA: Diagnosis not present

## 2019-08-22 DIAGNOSIS — Z79899 Other long term (current) drug therapy: Secondary | ICD-10-CM | POA: Diagnosis not present

## 2019-08-22 LAB — BASIC METABOLIC PANEL
Anion gap: 8 (ref 5–15)
BUN: 16 mg/dL (ref 8–23)
CO2: 26 mmol/L (ref 22–32)
Calcium: 9.1 mg/dL (ref 8.9–10.3)
Chloride: 107 mmol/L (ref 98–111)
Creatinine, Ser: 1.19 mg/dL — ABNORMAL HIGH (ref 0.44–1.00)
GFR calc Af Amer: 50 mL/min — ABNORMAL LOW (ref >60)
GFR calc non Af Amer: 43 mL/min — ABNORMAL LOW (ref >60)
Glucose, Bld: 111 mg/dL — ABNORMAL HIGH (ref 70–99)
Potassium: 3.7 mmol/L (ref 3.5–5.1)
Sodium: 141 mmol/L (ref 135–145)

## 2019-08-22 LAB — CBC
HCT: 38.7 % (ref 36.0–46.0)
Hemoglobin: 12.9 g/dL (ref 12.0–15.0)
MCH: 31.3 pg (ref 26.0–34.0)
MCHC: 33.3 g/dL (ref 30.0–36.0)
MCV: 93.9 fL (ref 80.0–100.0)
Platelets: 162 10*3/uL (ref 150–400)
RBC: 4.12 MIL/uL (ref 3.87–5.11)
RDW: 12.7 % (ref 11.5–15.5)
WBC: 7.5 10*3/uL (ref 4.0–10.5)
nRBC: 0 % (ref 0.0–0.2)

## 2019-08-22 LAB — TROPONIN I (HIGH SENSITIVITY)
Troponin I (High Sensitivity): 15 ng/L (ref <18)
Troponin I (High Sensitivity): 17 ng/L (ref <18)

## 2019-08-22 LAB — MAGNESIUM: Magnesium: 1.8 mg/dL (ref 1.7–2.4)

## 2019-08-22 LAB — GLUCOSE, CAPILLARY: Glucose-Capillary: 111 mg/dL — ABNORMAL HIGH (ref 70–99)

## 2019-08-22 LAB — PHOSPHORUS: Phosphorus: 2.5 mg/dL (ref 2.5–4.6)

## 2019-08-22 LAB — SARS CORONAVIRUS 2 (TAT 6-24 HRS): SARS Coronavirus 2: NEGATIVE

## 2019-08-22 MED ORDER — DISOPYRAMIDE PHOSPHATE 150 MG PO CAPS
150.0000 mg | ORAL_CAPSULE | Freq: Two times a day (BID) | ORAL | Status: DC
  Administered 2019-08-22 – 2019-08-23 (×3): 150 mg via ORAL
  Filled 2019-08-22 (×4): qty 1

## 2019-08-22 MED ORDER — METOPROLOL TARTRATE 50 MG PO TABS
50.0000 mg | ORAL_TABLET | Freq: Two times a day (BID) | ORAL | Status: DC
  Administered 2019-08-22 – 2019-08-23 (×2): 50 mg via ORAL
  Filled 2019-08-22 (×3): qty 1

## 2019-08-22 MED ORDER — RIVAROXABAN 15 MG PO TABS
15.0000 mg | ORAL_TABLET | Freq: Every day | ORAL | Status: DC
  Administered 2019-08-22 – 2019-08-23 (×2): 15 mg via ORAL
  Filled 2019-08-22 (×2): qty 1

## 2019-08-22 MED ORDER — LIDOCAINE 5 % EX PTCH
1.0000 | MEDICATED_PATCH | CUTANEOUS | Status: DC
  Administered 2019-08-22 – 2019-08-23 (×2): 1 via TRANSDERMAL
  Filled 2019-08-22 (×2): qty 1

## 2019-08-22 MED ORDER — PANTOPRAZOLE SODIUM 40 MG IV SOLR
40.0000 mg | INTRAVENOUS | Status: DC
  Administered 2019-08-22: 40 mg via INTRAVENOUS
  Filled 2019-08-22: qty 40

## 2019-08-22 MED ORDER — ONDANSETRON HCL 4 MG/2ML IJ SOLN: 4.0000 mg | Freq: Four times a day (QID) | INTRAMUSCULAR | Status: DC | PRN

## 2019-08-22 MED ORDER — METOPROLOL TARTRATE 50 MG PO TABS: 50.0000 mg | ORAL_TABLET | Freq: Two times a day (BID) | ORAL | Status: DC

## 2019-08-22 MED ORDER — AMLODIPINE BESYLATE 5 MG PO TABS
5.0000 mg | ORAL_TABLET | Freq: Two times a day (BID) | ORAL | Status: DC
  Administered 2019-08-22 – 2019-08-23 (×2): 5 mg via ORAL
  Filled 2019-08-22 (×2): qty 1

## 2019-08-22 MED ORDER — ALUM & MAG HYDROXIDE-SIMETH 200-200-20 MG/5ML PO SUSP: 15.0000 mL | ORAL | Status: DC | PRN

## 2019-08-22 MED ORDER — ACETAMINOPHEN 325 MG PO TABS: 650.0000 mg | ORAL_TABLET | Freq: Four times a day (QID) | ORAL | Status: DC | PRN

## 2019-08-22 MED ORDER — INSULIN ASPART 100 UNIT/ML ~~LOC~~ SOLN
0.0000 [IU] | Freq: Three times a day (TID) | SUBCUTANEOUS | Status: DC
  Administered 2019-08-23: 3 [IU] via SUBCUTANEOUS

## 2019-08-22 MED ORDER — ATORVASTATIN CALCIUM 10 MG PO TABS
20.0000 mg | ORAL_TABLET | Freq: Every day | ORAL | Status: DC
  Administered 2019-08-22: 20 mg via ORAL
  Filled 2019-08-22: qty 2

## 2019-08-22 MED ORDER — METOPROLOL TARTRATE 25 MG PO TABS: 25.0000 mg | ORAL_TABLET | Freq: Two times a day (BID) | ORAL | Status: DC

## 2019-08-22 NOTE — Progress Notes (Signed)
Patient ID: Monique [name], female   DOB: Dec 26, 1935, 83 y.o.   MRN: [id number]  PROGRESS NOTE    Monique [name]  [id number] DOB: 05-31-1936 DOA: 08/21/2019 PCP: Hoyt Koch, MD   Brief Narrative:  83 year old female with history of subarachnoid hemorrhage secondary to cerebral aneurysm several years ago, pulmonary embolism and DVT of lower extremity currently on Xarelto, paroxysmal A. fib, hypertrophic cardiomyopathy, hypertension, hyperlipidemia, diabetes mellitus type 2, chronic kidney disease stage III, chronic diastolic heart failure presented with chest pain which was easily reproducible on palpation but also had some chest tightness/heaviness.  Troponins x2 have been negative.  Assessment & Plan:   Chest pain, atypical -Pain is intermittently reproducible which points towards musculoskeletal pain however patient also states that she has intermittent chest tightness. -Troponins have been negative x2. -EKG did not show any significant ST-T wave changes -I have consulted cardiology.  Follow recommendations. -Continue lidocaine patch.  Will add Protonix daily along with as needed Maalox.  Acute kidney injury on chronic kidney disease stage III -Improving.  Monitor.  Diabetes mellitus type 2 -CBGs with SSI  Hypertension--blood pressure stable.  Continue amlodipine  Chronic diastolic CHF/history of hypertrophic obstructive cardiomyopathy -No evidence of fluid overload.  Strict input and output.  Daily weights.  Follow cardiology recommendations  History of pulmonary embolism and DVT of lower extremity on Xarelto -Resume Xarelto.  Paroxysmal A. fib -Currently rate controlled.  Resume Xarelto   DVT prophylaxis: Restart Xarelto Code Status: Full Family Communication: Spoke to patient at bedside Disposition Plan: Home once clinically improved and cleared by cardiology  Consultants: Cardiology  Procedures: None  Antimicrobials: None   Subjective: Patient  seen and examined at bedside.  She still complains of some chest pain, gets worse on touching the mid chest area but also intermittently as chest tightness.  No overnight fever, vomiting, worsening shortness of breath.  Objective: Vitals:   08/22/19 0630 08/22/19 0730 08/22/19 0800 08/22/19 0830  BP: 135/85 113/79 103/88 134/74  Pulse: 68 71 87 82  Resp: 11 17 15    Temp:      TempSrc:      SpO2: 97% 97% 99% 94%  Weight:      Height:       No intake or output data in the 24 hours ending 08/22/19 1108 Filed Weights   08/21/19 1800  Weight: 67.1 kg    Examination:  General exam: Appears calm and comfortable  Respiratory system: Bilateral decreased breath sounds at bases Cardiovascular system: S1 & S2 heard, Rate controlled.  Mildly tender lower midsternal area. Gastrointestinal system: Abdomen is nondistended, soft and nontender. Normal bowel sounds heard. Extremities: No cyanosis, clubbing, edema   Data Reviewed: I have personally reviewed following labs and imaging studies  CBC: Recent Labs  Lab 08/16/19 1327 08/21/19 1807 08/22/19 0238  WBC 9.2 9.8 7.5  HGB 14.1 13.6 12.9  HCT 43.1 42.9 38.7  MCV 95.0 97.3 93.9  PLT 193.0 187 0000000   Basic Metabolic Panel: Recent Labs  Lab 08/16/19 1327 08/21/19 1807 08/22/19 0033 08/22/19 0238  NA 143 138  --  141  K 4.0 3.5  --  3.7  CL 106 106  --  107  CO2 29 24  --  26  GLUCOSE 87 180*  --  111*  BUN 22 22  --  16  CREATININE 1.53* 1.59*  --  1.19*  CALCIUM 10.2 9.1  --  9.1  MG  --   --  1.8  --  PHOS  --   --  2.5  --    GFR: Estimated Creatinine Clearance: 34.7 mL/min (A) (by C-G formula based on SCr of 1.19 mg/dL (H)). Liver Function Tests: Recent Labs  Lab 08/16/19 1327  AST 15  ALT 6  ALKPHOS 101  BILITOT 0.6  PROT 7.6  ALBUMIN 4.2   No results for input(s): LIPASE, AMYLASE in the last 168 hours. No results for input(s): AMMONIA in the last 168 hours. Coagulation Profile: No results for input(s):  INR, PROTIME in the last 168 hours. Cardiac Enzymes: No results for input(s): CKTOTAL, CKMB, CKMBINDEX, TROPONINI in the last 168 hours. BNP (last 3 results) Recent Labs    08/16/19 1327  PROBNP 227.0*   HbA1C: No results for input(s): HGBA1C in the last 72 hours. CBG: No results for input(s): GLUCAP in the last 168 hours. Lipid Profile: No results for input(s): CHOL, HDL, LDLCALC, TRIG, CHOLHDL, LDLDIRECT in the last 72 hours. Thyroid Function Tests: No results for input(s): TSH, T4TOTAL, FREET4, T3FREE, THYROIDAB in the last 72 hours. Anemia Panel: No results for input(s): VITAMINB12, FOLATE, FERRITIN, TIBC, IRON, RETICCTPCT in the last 72 hours. Sepsis Labs: No results for input(s): PROCALCITON, LATICACIDVEN in the last 168 hours.  Recent Results (from the past 240 hour(s))  SARS CORONAVIRUS 2 (TAT 6-24 HRS) Nasopharyngeal Nasopharyngeal Swab     Status: None   Collection Time: 08/21/19  9:30 PM   Specimen: Nasopharyngeal Swab  Result Value Ref Range Status   SARS Coronavirus 2 NEGATIVE NEGATIVE Final    Comment: (NOTE) SARS-CoV-2 target nucleic acids are NOT DETECTED. The SARS-CoV-2 RNA is generally detectable in upper and lower respiratory specimens during the acute phase of infection. Negative results do not preclude SARS-CoV-2 infection, do not rule out co-infections with other pathogens, and should not be used as the sole basis for treatment or other patient management decisions. Negative results must be combined with clinical observations, patient history, and epidemiological information. The expected result is Negative. Fact Sheet for Patients: SugarRoll.be Fact Sheet for Healthcare Providers: https://www.woods-mathews.com/ This test is not yet approved or cleared by the [place] FDA and  has been authorized for detection and/or diagnosis of SARS-CoV-2 by FDA under an Emergency Use Authorization (EUA). This EUA will  remain  in effect (meaning this test can be used) for the duration of the COVID-19 declaration under Section 56 4(b)(1) of the Act, 21 U.S.C. section 360bbb-3(b)(1), unless the authorization is terminated or revoked sooner. Performed at [facility], [place] [address] [place], [place] [address]          Radiology Studies: DG Chest 2 View  Result Date: 08/21/2019 CLINICAL DATA:  Chest pain EXAM: CHEST - 2 VIEW COMPARISON:  09/07/2017 FINDINGS: The heart size and mediastinal contours are stable. Tortuosity of the thoracic aorta. Lungs are mildly hyperinflated. No focal airspace consolidation, pleural effusion, or pneumothorax. The visualized skeletal structures are unremarkable. IMPRESSION: No active cardiopulmonary disease. Electronically Signed   By: Davina Poke M.D.   On: 08/21/2019 19:15        Scheduled Meds: . amLODipine  5 mg Oral BID  . atorvastatin  20 mg Oral q1800  . insulin aspart  0-9 Units Subcutaneous TID WC  . lidocaine  1 patch Transdermal Q24H   Continuous Infusions:        Aline August, MD [facility] 08/22/2019, 11:08 AM

## 2019-08-22 NOTE — Plan of Care (Signed)
  Problem: Clinical Measurements: Goal: Respiratory complications will improve Outcome: Progressing Goal: Cardiovascular complication will be avoided Outcome: Progressing   

## 2019-08-22 NOTE — ED Notes (Signed)
PT's son called to say he is coming to pick her up that she does not need to be in the hospital. He is encouraged to allow her to get the care she needs He continues to say "I'm coming to get her in an hour, get her ready."

## 2019-08-22 NOTE — Consult Note (Addendum)
Cardiology Consultation:   Patient ID: Monique [name]; [id number]; 1936/01/12   Admit date: 08/21/2019 Date of Consult: 08/22/2019  Primary Care Provider: Hoyt Koch, MD Primary Cardiologist: Dorris Carnes, MD Primary Electrophysiologist:  None    Patient Profile:   Monique [name] is a 83 y.o. female with a PMH of HOCM, HTN, paroxysmal atrial fibrillation, bilateral PE/DVT, and CKD stage 3, and remote cerebral aneurysm with subarachnoid hemorrhage who is being seen today for the evaluation of chest pain at the request of Dr. Starla Link.  History of Present Illness:   Monique [name] was in her usual state of health until about 10 days ago when she began experiencing exertional chest tightness/SOB lasting for 5-10 minutes at a time. She initially saw her PCP for this complaint 08/16/2019, at which time she reported increased stress with recent loss of her sister to colon cancer, as well as associated racing heart beat sensations with CP/SOB. Her EKG was reported to be non-ischemic at that time. She continued to have intermittent chest tightness which acutely worsened 08/21/2019 prompting her to present to the ED for further evaluation.  Some days pain lasts all day.  Currently no pain.   She was last evaluated by cardiology at an outpatient visit with Dr. Harrington Challenger 04/2019, at which time she was without cardiac complaints, sans occasional CP in AM felt to be 2/2 GERD. He last echo 09/2017 showed EF 65-70%, G1DD, HOCM without significant obstruction. Her last ischemic evaluation was a NST 09/2017 which was a low risk study with normal perfusion and normal LV function. She had a LHC in 2009 which was without CAD, normal LV function, and no outflow tract gradient.   ED course: VSS. Labs notable for electrolytes wnl, Cr 1.59>1.19, CBC wnl, Trop 13>14>15>17. EKG with sinus rhythm with PVCs, rate 69 bpm, QTc 499, LVH, LAFB, no STE/D, no TWI; no significant change from previous. CP was felt to have a MSK  component and she was given a lidocaine patch. Also given a GI cocktail for possible GERD component.   EKG:  The EKG was personally reviewed and demonstrates:  SR 69 PVC LAFB LVH Qtc 499.  New PVCs when compared to old.  Telemetry:  Telemetry was personally reviewed and demonstrates:  SR with PVC  BP 112.92, P 77   Comfortable currently.  Between my visit and Dr. Marlou Porch pt went into SVT at 160 to 130.    Past Medical History:  Diagnosis Date  . Abdominal pain 11/18/2015  . Acute respiratory failure with hypoxia ([place]) 06/27/2014  . Breast pain, left 10/11/2016  . Cerebral aneurysm 2000 06/27/2014  . Chest pain 10/30/2014  . CHF (congestive heart failure) ([place])   . Chronic anticoagulation 06/2014   Coumadin started after bilateral PE  . Chronic kidney disease, stage III (moderate) 11/21/2014  . Clotting disorder ([place])    right was removed, still have left cataract  . Diabetes ([place])   . Dizziness and giddiness 11/28/2014  . DVT of lower extremity (deep venous thrombosis) ([place]) 11/28/2014  . Dysuria 07/07/2014  . Elevated troponin 11/28/2014  . Fatty liver   . Gastritis 05/16/2012   Chronic dyspepsia on PPI daily.   Marland Kitchen Headache, acute 06/08/2010   Qualifier: Diagnosis of  By: Linda Hedges MD, Heinz Knuckles   . Heart murmur   . Hematochezia 11/18/2015  . History of fibromuscular dysplasia 06/27/2014  . HTN (hypertension)    Essential  . Hyperlipidemia   . Hypertrophic obstructive cardiomyopathy (HOCM) ([place]) 05/20/2008  Qualifier: Diagnosis of  By: Linda Hedges MD, Heinz Knuckles   . Kidney stones   . LVH (left ventricular hypertrophy)   . Orthostatic hypotension 11/28/2014  . PAF (paroxysmal atrial fibrillation) ([place]) 2000  . Palpitations 11/28/2014  . PAROXYSMAL ATRIAL FIBRILLATION 05/20/2008   Qualifier: Diagnosis of  By: Linda Hedges MD, Heinz Knuckles   . PE (pulmonary embolism) 06/2014   Bilateral  . Postmenopausal osteoporosis 04/2019   T score -3.3  . Pulmonary embolism, bilateral ([place]) 06/27/2014  . SOB  (shortness of breath) 06/27/2014  . Subarachnoid hemorrhage ([place]) 2000  . UTI (urinary tract infection) 01/04/2015    Past Surgical History:  Procedure Laterality Date  . ABDOMINAL HYSTERECTOMY  2000  . Brain hemorrage     2000  . CARDIAC CATHETERIZATION  05/2008   Nl cors, no gradient, EF 60%     Home Medications:  Prior to Admission medications   Medication Sig Start Date End Date Taking? Authorizing Provider  acetaminophen (TYLENOL) 500 MG tablet Take 1,000 mg by mouth.   Yes [provider]  amLODipine (NORVASC) 5 MG tablet TAKE 1 TABLET BY MOUTH 2 TIMES A DAY Patient taking differently: Take 5 mg by mouth 2 (two) times daily.  07/26/19  Yes Fay Records, MD  atorvastatin (LIPITOR) 20 MG tablet TAKE 1 TABLET (20 MG TOTAL) BY MOUTH DAILY AT 6 PM. 08/05/19  Yes Burns, Claudina Lick, MD  disopyramide (NORPACE) 150 MG capsule TAKE 1 CAPSULE (150 MG TOTAL) BY MOUTH 2 (TWO) TIMES DAILY. 07/17/19  Yes Fay Records, MD  metFORMIN (GLUCOPHAGE) 500 MG tablet TAKE 1 TABLET (500 MG TOTAL) BY MOUTH DAILY WITH BREAKFAST. Patient taking differently: Take 500 mg by mouth daily with breakfast.  04/19/19  Yes Hoyt Koch, MD  metoprolol tartrate (LOPRESSOR) 25 MG tablet TAKE 1 TABLET BY MOUTH 2 TIMES A DAY Patient taking differently: Take 25 mg by mouth 2 (two) times daily.  07/26/19  Yes Fay Records, MD  mirtazapine (REMERON) 15 MG tablet TAKE 1 TABLET BY MOUTH EVERYDAY AT BEDTIME Patient taking differently: Take 15 mg by mouth at bedtime. TAKE 1 TABLET BY MOUTH EVERYDAY AT BEDTIME 08/09/19  Yes Hoyt Koch, MD  nitroGLYCERIN (NITROSTAT) 0.4 MG SL tablet PLACE 1 TABLET (0.4 MG TOTAL) UNDER THE TONGUE EVERY 5 (FIVE) MINUTES AS NEEDED FOR CHEST PAIN. 10/01/18  Yes Bhagat, Bhavinkumar, PA  pantoprazole (PROTONIX) 40 MG tablet TAKE 1 TABLET BY MOUTH EVERY DAY Patient taking differently: Take 40 mg by mouth daily.  08/09/19  Yes Hoyt Koch, MD  sertraline (ZOLOFT) 50 MG  tablet TAKE 1 TABLET (50 MG TOTAL) BY MOUTH DAILY. Patient taking differently: Take 50 mg by mouth daily.  02/24/15  Yes Hoyt Koch, MD  XARELTO 15 MG TABS tablet TAKE 1 TABLET BY MOUTH EVERY DAY WITH SUPPER Patient taking differently: Take 15 mg by mouth daily.  04/01/19  Yes Fay Records, MD  montelukast (SINGULAIR) 10 MG tablet Take 1 tablet (10 mg total) by mouth at bedtime. Patient not taking: Reported on 08/22/2019 06/20/17   Hoyt Koch, MD    Inpatient Medications: Scheduled Meds: . amLODipine  5 mg Oral BID  . atorvastatin  20 mg Oral q1800  . disopyramide  150 mg Oral BID  . insulin aspart  0-9 Units Subcutaneous TID WC  . lidocaine  1 patch Transdermal Q24H  . metoprolol tartrate  50 mg Oral BID  . pantoprazole (PROTONIX) IV  40 mg Intravenous Q24H  .  Rivaroxaban  15 mg Oral Daily   Continuous Infusions:  PRN Meds: acetaminophen, alum & mag hydroxide-simeth, nitroGLYCERIN, ondansetron (ZOFRAN) IV  Allergies:   No Known Allergies  Social History:   Social History   Socioeconomic History  . Marital status: Divorced    Spouse name: Not on file  . Number of children: 3  . Years of education: 3  . Highest education level: Not on file  Occupational History  . Occupation: Retired [employer]  . Smoking status: Former Smoker    Quit date: 09/06/1967    Years since quitting: 51.9  . Smokeless tobacco: Never Used  Substance and Sexual Activity  . Alcohol use: No    Alcohol/week: 0.0 standard drinks  . Drug use: No  . Sexual activity: Not Currently    Comment: 1st intercourse 83yo-Fewer than 5 partners  Other Topics Concern  . Not on file  Social History Narrative   HSG. Married '61 - '88/divorced. 3 sons - '74, '59, '62, 1 daughter '63; 5 grandchildren. Lives alone in two story home. She does not drive.   Retired from [employer].   Social Determinants of Health   Financial Resource Strain:   . Difficulty  of Paying Living Expenses: Not on file  Food Insecurity:   . Worried About Charity fundraiser in the Last Year: Not on file  . Ran Out of Food in the Last Year: Not on file  Transportation Needs:   . Lack of Transportation (Medical): Not on file  . Lack of Transportation (Non-Medical): Not on file  Physical Activity:   . Days of Exercise per Week: Not on file  . Minutes of Exercise per Session: Not on file  Stress:   . Feeling of Stress : Not on file  Social Connections:   . Frequency of Communication with Friends and Family: Not on file  . Frequency of Social Gatherings with Friends and Family: Not on file  . Attends Religious Services: Not on file  . Active Member of Clubs or Organizations: Not on file  . Attends Archivist Meetings: Not on file  . Marital Status: Not on file  Intimate Partner Violence:   . Fear of Current or Ex-Partner: Not on file  . Emotionally Abused: Not on file  . Physically Abused: Not on file  . Sexually Abused: Not on file    Family History:    Family History  Problem Relation Age of Onset  . Breast cancer Mother 35       Deceased  . Stroke Mother   . Arrhythmia Mother   . Heart disease Mother   . Arrhythmia Sister   . Breast cancer Daughter 31  . Colon cancer Neg Hx   . Esophageal cancer Neg Hx   . Pancreatic cancer Neg Hx   . Rectal cancer Neg Hx   . Stomach cancer Neg Hx      ROS:  Please see the history of present illness.  Review of Systems  All other systems reviewed and are negative.   General:no colds or fevers, no weight changes Skin:no rashes or ulcers HEENT:no blurred vision, no congestion CV:see HPI PUL:see HPI GI:no diarrhea constipation or melena- occ bright blood- due to Hemorrhoids, some indigestion has been on protonix  GU:no hematuria, no dysuria MS:no joint pain, no claudication Neuro:no syncope, no lightheadedness Endo:+ diabetes, no thyroid disease  All other ROS reviewed and negative.      Physical Exam/Data:  Vitals:   08/22/19 1000 08/22/19 1030 08/22/19 1100 08/22/19 1148  BP: 110/75 133/70 (!) 112/92 126/86  Pulse: 75 73 79 88  Resp:    18  Temp:    97.7 F (36.5 C)  TempSrc:    Oral  SpO2: 99% 95% 94% 100%  Weight:    64.4 kg  Height:    5\' 6"  (1.676 m)   No intake or output data in the 24 hours ending 08/22/19 1358 Filed Weights   08/21/19 1800 08/22/19 1148  Weight: 67.1 kg 64.4 kg   Body mass index is 22.92 kg/m.  General:  Well nourished, well developed, in no acute distress, some anxiety staying in hospital HEENT: sclera anicteric  Lymph: no adenopathy Neck: no JVD Endocrine:  No thryomegaly Vascular: No carotid bruits; distal pulses 2+ bilaterally Cardiac:  normal S1, S2; RRR; soft 1/6 murmur no gallup or rub, chest wall with some tenderness on sternum  Lungs:  clear to auscultation bilaterally, no wheezing, rhonchi or rales  Abd: + BS, soft, nontender, no hepatomegaly Ext: no edema Musculoskeletal:  No deformities, BUE and BLE strength normal and equal Skin: warm and dry  Neuro:  Alert and oriented X 3, MAE follows commands, no focal abnormalities noted Psych:  Normal affect     Relevant CV Studies: Echo 09/09/17 Study Conclusions  - Left ventricle: The cavity size was normal. Wall thickness was   increased in a pattern of moderate LVH. Systolic function was   vigorous. The estimated ejection fraction was in the range of 65%   to 70%. There is an intracavitary gradient created by asymmetic   LVH, mild SAM, and hyperdnamic systolic function. Difficult   assessment of peak gradient from available tracings, peak   gradient 34 mmHg. No provocative maneuvers were performed.   Doppler parameters are consistent with abnormal left ventricular   relaxation (grade 1 diastolic dysfunction). - Aortic valve: Mildly calcified annulus. Mildly thickened   leaflets. Mean gradient (S): 21 mm Hg. Morphologically the valve   looks normal, the measured  gradient appears to be subvalvular   from her dynamic intracaviatry gradient. Valve area (VTI): 1.55   cm^2. Valve area (Vmax): 2.05 cm^2. - Mitral valve: Mildly calcified annulus. Normal thickness leaflets   . Cardiac cath 2009  INDICATIONS:  Chest pain and hypertrophic cardiomyopathy.   PROCEDURES:  1. Left heart catheterization.  2. Coronary angiography.  3. Left ventriculography.   PROCEDURE NOTE:  After informed consent was obtained, the right groin  was sterilely prepped and draped.  1% lidocaine was used to topically  anesthetize the right groin.  The right common femoral artery was  entered using Seldinger technique.  A 6-French vascular sheath was  placed in the right common femoral artery.  The left coronary artery and  the right coronary artery both engaged with the 6-French multipurpose  catheter. The ventricle was entered using the 6-French multipurpose  catheter.  There were no complications.   HEMODYNAMICS:  LV 142/14/23 and aorta 146/82.   LEFT VENTRICULOGRAM:  EF was 60%.  There were no wall motion  abnormalities.  There was no gradient on pullback from the mid left  ventricle to the left ventricular outflow tract to the aorta signifying  no evidence for a LV outflow tract gradient.   FINDINGS:  Coronary angiography:  The coronary system is right dominant.  There is no significant coronary disease.   ASSESSMENT AND PLAN:  No coronary disease.  Normal left ventricular  ejection fraction with  no outflow tract gradient.  The left ventricular  end-diastolic pressure was somewhat elevated at 23 mmHg.  We will plan  for medical management.     Laboratory Data:  Chemistry Recent Labs  Lab 08/16/19 1327 08/21/19 1807 08/22/19 0238  NA 143 138 141  K 4.0 3.5 3.7  CL 106 106 107  CO2 29 24 26   GLUCOSE 87 180* 111*  BUN 22 22 16   CREATININE 1.53* 1.59* 1.19*  CALCIUM 10.2 9.1 9.1  GFRNONAA  --  30* 43*  GFRAA  --  35* 50*  ANIONGAP  --  8 8      Recent Labs  Lab 08/16/19 1327  PROT 7.6  ALBUMIN 4.2  AST 15  ALT 6  ALKPHOS 101  BILITOT 0.6   Hematology Recent Labs  Lab 08/16/19 1327 08/21/19 1807 08/22/19 0238  WBC 9.2 9.8 7.5  RBC 4.54 4.41 4.12  HGB 14.1 13.6 12.9  HCT 43.1 42.9 38.7  MCV 95.0 97.3 93.9  MCH  --  30.8 31.3  MCHC 32.8 31.7 33.3  RDW 13.7 12.9 12.7  PLT 193.0 187 162   Cardiac EnzymesNo results for input(s): TROPONINI in the last 168 hours. No results for input(s): TROPIPOC in the last 168 hours.  BNP Recent Labs  Lab 08/16/19 1327  PROBNP 227.0*    DDimer No results for input(s): DDIMER in the last 168 hours.  Radiology/Studies:  DG Chest 2 View  Result Date: 08/21/2019 CLINICAL DATA:  Chest pain EXAM: CHEST - 2 VIEW COMPARISON:  09/07/2017 FINDINGS: The heart size and mediastinal contours are stable. Tortuosity of the thoracic aorta. Lungs are mildly hyperinflated. No focal airspace consolidation, pleural effusion, or pneumothorax. The visualized skeletal structures are unremarkable. IMPRESSION: No active cardiopulmonary disease. Electronically Signed   By: Davina Poke M.D.   On: 08/21/2019 19:15    Assessment and Plan:   1. Chest pain, with low troponins, no acute ST changes on EKG - normal cath in 2009.  Pain may be MS or GI has had some in past treated with protonix- Dr. Marlou Porch to see 2. PVCs new for pt occ 3. HTN stable on meds lopressor 25 BID and norpace 150 mg BID 4. SVT with rate to 160 will increase lopressor and resume norpace.  Dr. Marlou Porch is examining  5. PAF, maintaining SR on metoprolol and xarelto 15 mg daily 6. HOCM stable  7. Hx PE on xarelto 8. HLD last LDL 53 continue statin  9. ckd-3 today Cr 1.19 though was elevated to 1.59 initially.    For questions or updates, please contact [place] Please consult [email or web address] for contact info under Cardiology/STEMI.   Signed, Candee Furbish, MD  08/22/2019 1:58 PM [id number] Cecilie Kicks, FNP-C [facility]  [id number] or after 5pm and on weekends call [phone] 08/22/2019   Personally seen and examined. Agree with above.  83 year old with hypertrophic cardiomyopathy here with atypical chest wall discomfort and sensation of discomfort around her left breast intermittently/continuously at times with negative troponin high-sensitivity.  Most recent echocardiogram demonstrates hypertrophic obstructive cardiomyopathy with systolic anterior motion of the mitral valve leaflet.  She has been on disopyramide at home as well as metoprolol 25 twice a day.  Just before we entered the room, she had been in a SVT initially at 160 bpm then 134 then broke after about 10 minutes.  Back in sinus rhythm.  When asked, she did feel slightly winded during this episode but she did not feel  any chest discomfort or pressure.  She thinks that she is having an occasional tachy arrhythmia at home.  GEN: Well nourished, well developed, in no acute distress  HEENT: normal  Neck: no JVD, carotid bruits, or masses Cardiac: RRR; 2/6 S murmur, no rubs, or gallops,no edema  Respiratory:  clear to auscultation bilaterally, normal work of breathing GI: soft, nontender, nondistended, + BS MS: no deformity or atrophy  Skin: warm and dry, no rash Neuro:  Alert and Oriented x 3, Strength and sensation are intact Psych: euthymic mood, full affect  Assessment and plan:  Atypical chest pain Hypertrophic obstructive cardiomyopathy Paroxysmal supraventricular tachycardia  -We have restarted Norpace/disopyramide, antiarrhythmic utilized in hypertrophic cardiomyopathy. -We have also increased metoprolol from 25 twice a day to 50 twice a day to help reduce her overall resting heart rate to improve her hypertrophic gradient and to also help decrease episodes of tachycardia. -With troponin being normal and atypical nature of chest discomfort, I do not think that we need to proceed with any further stress test or  CT evaluation at this time. -I do think it makes sense for [place] to watch her overnight given this recent episode of tachycardia.  Candee Furbish, MD

## 2019-08-22 NOTE — ED Notes (Signed)
On assessment, patient says that she is having chest pain, she says that she was fine until the doctor palpated her chest during assessment, pain returned, pain reproducible to palpation during assessment, no associated symptoms. Spoke with admitting provider, he will order Lidocaine patch for pain.

## 2019-08-23 LAB — GLUCOSE, CAPILLARY: Glucose-Capillary: 202 mg/dL — ABNORMAL HIGH (ref 70–99)

## 2019-08-23 MED ORDER — ALUM & MAG HYDROXIDE-SIMETH 200-200-20 MG/5ML PO SUSP: 15.0000 mL | ORAL | 0 refills | Status: DC | PRN

## 2019-08-23 MED ORDER — LIDOCAINE 5 % EX PTCH: 1.0000 | MEDICATED_PATCH | CUTANEOUS | 0 refills | Status: DC

## 2019-08-23 MED ORDER — METOPROLOL TARTRATE 50 MG PO TABS: 50.0000 mg | ORAL_TABLET | Freq: Two times a day (BID) | ORAL | 0 refills | Status: DC

## 2019-08-23 NOTE — Plan of Care (Signed)

## 2019-08-23 NOTE — Progress Notes (Signed)
Progress Note  Patient Name: Monique [name] Date of Encounter: 08/23/2019  Primary Cardiologist: Dorris Carnes, MD   Subjective   Feels well this morning. No chest pain. No further episodes of SVT.  Inpatient Medications    Scheduled Meds: . amLODipine  5 mg Oral BID  . atorvastatin  20 mg Oral q1800  . disopyramide  150 mg Oral BID  . insulin aspart  0-9 Units Subcutaneous TID WC  . lidocaine  1 patch Transdermal Q24H  . metoprolol tartrate  50 mg Oral BID  . pantoprazole (PROTONIX) IV  40 mg Intravenous Q24H  . Rivaroxaban  15 mg Oral Daily   Continuous Infusions:  PRN Meds: acetaminophen, alum & mag hydroxide-simeth, nitroGLYCERIN, ondansetron (ZOFRAN) IV   Vital Signs    Vitals:   08/22/19 2122 08/23/19 0534 08/23/19 0853 08/23/19 0856  BP: 104/64 (!) 144/81 125/70   Pulse: (!) 58 72  76  Resp: 18 18    Temp: 98.3 F (36.8 C) 97.9 F (36.6 C)    TempSrc: Oral Oral    SpO2: 96% 95%    Weight:  64 kg    Height:        Intake/Output Summary (Last 24 hours) at 08/23/2019 0951 Last data filed at 08/22/2019 1700 Gross per 24 hour  Intake 120 ml  Output --  Net 120 ml   Last 3 Weights 08/23/2019 08/22/2019 08/21/2019  Weight (lbs) 141 lb 3.2 oz 142 lb 148 lb  Weight (kg) 64.048 kg 64.411 kg 67.132 kg      Telemetry    No further episodes of SVT. Yesterday at around 1:30 PM had SVT for starting at 160 then down to 134 then terminated after about 10 minutes.- Personally Reviewed  ECG    Sinus rhythm, QT approximately 440- Personally Reviewed  Physical Exam   GEN: No acute distress.   Neck: No JVD Cardiac: RRR, 2/6 systolic murmur no rubs, or gallops.  Respiratory: Clear to auscultation bilaterally. GI: Soft, nontender, non-distended  MS: No edema; No deformity. Neuro:  Nonfocal  Psych: Normal affect   Labs    High Sensitivity Troponin:   Recent Labs  Lab 08/21/19 1807 08/21/19 2001 08/22/19 0033 08/22/19 0238  TROPONINIHS 13 14 15 17        Chemistry Recent Labs  Lab 08/16/19 1327 08/21/19 1807 08/22/19 0238  NA 143 138 141  K 4.0 3.5 3.7  CL 106 106 107  CO2 29 24 26   GLUCOSE 87 180* 111*  BUN 22 22 16   CREATININE 1.53* 1.59* 1.19*  CALCIUM 10.2 9.1 9.1  PROT 7.6  --   --   ALBUMIN 4.2  --   --   AST 15  --   --   ALT 6  --   --   ALKPHOS 101  --   --   BILITOT 0.6  --   --   GFRNONAA  --  30* 43*  GFRAA  --  35* 50*  ANIONGAP  --  8 8     Hematology Recent Labs  Lab 08/16/19 1327 08/21/19 1807 08/22/19 0238  WBC 9.2 9.8 7.5  RBC 4.54 4.41 4.12  HGB 14.1 13.6 12.9  HCT 43.1 42.9 38.7  MCV 95.0 97.3 93.9  MCH  --  30.8 31.3  MCHC 32.8 31.7 33.3  RDW 13.7 12.9 12.7  PLT 193.0 187 162    BNP Recent Labs  Lab 08/16/19 1327  PROBNP 227.0*     DDimer No  results for input(s): DDIMER in the last 168 hours.   Radiology    DG Chest 2 View  Result Date: 08/21/2019 CLINICAL DATA:  Chest pain EXAM: CHEST - 2 VIEW COMPARISON:  09/07/2017 FINDINGS: The heart size and mediastinal contours are stable. Tortuosity of the thoracic aorta. Lungs are mildly hyperinflated. No focal airspace consolidation, pleural effusion, or pneumothorax. The visualized skeletal structures are unremarkable. IMPRESSION: No active cardiopulmonary disease. Electronically Signed   By: Davina Poke M.D.   On: 08/21/2019 19:15    Cardiac Studies   Echocardiogram 2019: EF 70% mild Sam    Patient Profile     84 y.o. female with hypertrophic cardiomyopathy, atypical chest pain prior PE on Xarelto  Assessment & Plan    Atypical chest pain -Likely chest wall/musculoskeletal discomfort. Troponins are normal. She did not have discomfort with her SVT episode yesterday. No further ischemic evaluation at this time.  Hypertrophic obstructive cardiomyopathy/PSVT -We restarted her disopyramide/Norpace yesterday and increased her metoprolol to 50 mg twice a day. Continue with the increased metoprolol dose of 50 twice a day.  No further episodes of SVT.  Chronic anticoagulation secondary to paroxysmal atrial fibrillation with history of PE -Continue Xarelto  Okay for discharge from cardiology perspective. We will set up close follow-up with Dr. Harrington Challenger or APP       For questions or updates, please contact [facility] Please consult [email or web address] for contact info under        Signed, Candee Furbish, MD  08/23/2019, 9:51 AM

## 2019-08-23 NOTE — Discharge Summary (Signed)
Physician Discharge Summary  Monique [name] [id number] DOB: 10/21/1935 DOA: 08/21/2019  PCP: Hoyt Koch, MD  Admit date: 08/21/2019 Discharge date: 08/23/2019  Admitted From: Home Disposition: Home  Recommendations for Outpatient Follow-up:  1. Follow up with PCP in 1 week with repeat CBC/BMP 2. Outpatient follow-up with cardiology 3. Follow up in ED if symptoms worsen or new appear   Home Health: No Equipment/Devices: None  Discharge Condition: Stable CODE STATUS: Full Diet recommendation: Heart healthy  Brief/Interim Summary: 83 year old female with history of subarachnoid hemorrhage secondary to cerebral aneurysm several years ago, pulmonary embolism and DVT of lower extremity currently on Xarelto, paroxysmal A. fib, hypertrophic cardiomyopathy, hypertension, hyperlipidemia, diabetes mellitus type 2, chronic kidney disease stage III, chronic diastolic heart failure presented with chest pain which was easily reproducible on palpation but also had some chest tightness/heaviness.  Troponins x2 have been negative.  Cardiology was consulted.  Patient had an episode of SVT during the hospitalization.  Metoprolol dose was increased.  She was also started on Lidoderm patch.  Subsequently, chest pain has resolved.  Heart rate has been stable.  Cardiology has cleared the patient for discharge on increased dose of metoprolol.  She will be discharged home today with outpatient follow-up with cardiology.  Discharge Diagnoses:   Chest pain, atypical -Pain is intermittently reproducible which points towards musculoskeletal pain however patient also had intermittent chest tightness. -Troponins have been negative x2. -EKG did not show any significant ST-T wave changes -Patient had an episode of SVT while in the hospital.  Cardiology has evaluated the patient and increased the dose of metoprolol.  Subsequently, no further episodes of SVT.  Continue disopyramide.  Outpatient follow-up  with cardiology.  Cardiology has cleared the patient for discharge. -Continue lidocaine patch.    Continue oral Protonix along with as needed Maalox.  Acute kidney injury on chronic kidney disease stage III -Improving.  Outpatient follow-up.  Diabetes mellitus type 2 -Continue home regimen.  Hypertension--blood pressure stable.  Continue amlodipine and increased dose of metoprolol.  Chronic diastolic CHF/history of hypertrophic obstructive cardiomyopathy -No evidence of fluid overload.  Continue disopyramide.  No further episodes of SVT.  Metoprolol plan as above.  Outpatient follow-up with cardiology.  History of pulmonary embolism and DVT of lower extremity on Xarelto -Continue Xarelto.  Paroxysmal A. fib -Currently rate controlled.  Metoprolol plan as above. Resume Xarelto   Discharge Instructions  Discharge Instructions    Diet - low sodium heart healthy   Complete by: As directed    Increase activity slowly   Complete by: As directed      Allergies as of 08/23/2019   No Known Allergies     Medication List    STOP taking these medications   montelukast 10 MG tablet Commonly known as: SINGULAIR     TAKE these medications   acetaminophen 500 MG tablet Commonly known as: TYLENOL Take 1,000 mg by mouth.   alum & mag hydroxide-simeth 200-200-20 MG/5ML suspension Commonly known as: MAALOX/MYLANTA Take 15 mLs by mouth every 4 (four) hours as needed for indigestion or heartburn.   amLODipine 5 MG tablet Commonly known as: NORVASC TAKE 1 TABLET BY MOUTH 2 TIMES A DAY   atorvastatin 20 MG tablet Commonly known as: LIPITOR TAKE 1 TABLET (20 MG TOTAL) BY MOUTH DAILY AT 6 PM.   disopyramide 150 MG capsule Commonly known as: NORPACE TAKE 1 CAPSULE (150 MG TOTAL) BY MOUTH 2 (TWO) TIMES DAILY.   lidocaine 5 % Commonly known as: LIDODERM  Place 1 patch onto the skin daily for 10 days. Remove & Discard patch within 12 hours or as directed by MD Start taking on:  August 24, 2019   metFORMIN 500 MG tablet Commonly known as: GLUCOPHAGE TAKE 1 TABLET (500 MG TOTAL) BY MOUTH DAILY WITH BREAKFAST. What changed: See the new instructions.   metoprolol tartrate 50 MG tablet Commonly known as: LOPRESSOR Take 1 tablet (50 mg total) by mouth 2 (two) times daily. What changed:   medication strength  how much to take   mirtazapine 15 MG tablet Commonly known as: REMERON TAKE 1 TABLET BY MOUTH EVERYDAY AT BEDTIME What changed: See the new instructions.   nitroGLYCERIN 0.4 MG SL tablet Commonly known as: NITROSTAT PLACE 1 TABLET (0.4 MG TOTAL) UNDER THE TONGUE EVERY 5 (FIVE) MINUTES AS NEEDED FOR CHEST PAIN.   pantoprazole 40 MG tablet Commonly known as: PROTONIX TAKE 1 TABLET BY MOUTH EVERY DAY   sertraline 50 MG tablet Commonly known as: ZOLOFT TAKE 1 TABLET (50 MG TOTAL) BY MOUTH DAILY. What changed: See the new instructions.   Xarelto 15 MG Tabs tablet Generic drug: Rivaroxaban TAKE 1 TABLET BY MOUTH EVERY DAY WITH SUPPER What changed: See the new instructions.      Follow-up Information    Hoyt Koch, MD. Schedule an appointment as soon as possible for a visit in 1 week(s).   Specialty: Internal Medicine Contact information: [place] [address] [phone]        Fay Records, MD .   Specialty: Cardiology Contact information: [place] [address] [phone]          No Known Allergies  Consultations:  Cardiology   Procedures/Studies: DG Chest 2 View  Result Date: 08/21/2019 CLINICAL DATA:  Chest pain EXAM: CHEST - 2 VIEW COMPARISON:  09/07/2017 FINDINGS: The heart size and mediastinal contours are stable. Tortuosity of the thoracic aorta. Lungs are mildly hyperinflated. No focal airspace consolidation, pleural effusion, or pneumothorax. The visualized skeletal structures are unremarkable. IMPRESSION: No active cardiopulmonary disease.  Electronically Signed   By: Davina Poke M.D.   On: 08/21/2019 19:15   CT Abdomen Pelvis W Contrast  Result Date: 07/31/2019 CLINICAL DATA:  Right lower quadrant abdominal pain, history of hysterectomy EXAM: CT ABDOMEN AND PELVIS WITH CONTRAST TECHNIQUE: Multidetector CT imaging of the abdomen and pelvis was performed using the standard protocol following bolus administration of intravenous contrast. CONTRAST:  16mL ISOVUE-300 IOPAMIDOL (ISOVUE-300) INJECTION 61%, additional oral enteric contrast COMPARISON:  01/09/2013 FINDINGS: Lower chest: No acute abnormality. Hepatobiliary: No solid liver abnormality is seen. No gallstones, gallbladder wall thickening, or biliary dilatation. Pancreas: Unremarkable. No pancreatic ductal dilatation or surrounding inflammatory changes. Spleen: Normal in size without significant abnormality. Adrenals/Urinary Tract: Adrenal glands are unremarkable. There are multiple bilateral nonobstructive renal calculi, particularly a 1.5 cm calculus of the inferior pole of the left kidney, which has significantly increased in size compared to prior examination dated 01/09/2013 (series 3, image 64). No hydronephrosis. There is a phlebolith adjacent to the distal right ureter, which is not within the ureter and unchanged compared to prior examination dated 2014. Bladder is unremarkable. Stomach/Bowel: Stomach is within normal limits. Appendix appears normal. No evidence of bowel wall thickening, distention, or inflammatory changes. Vascular/Lymphatic: Aortic atherosclerosis. No enlarged abdominal or pelvic lymph nodes. Reproductive: Status post hysterectomy. Other: No abdominal wall hernia or abnormality. No abdominopelvic ascites. Musculoskeletal: No acute or significant osseous findings. IMPRESSION: 1. No acute  abnormality identified in the abdomen or pelvis. Normal appendix. 2. There are multiple bilateral nonobstructive renal calculi, particularly a 1.5 cm calculus of the inferior  pole of the left kidney, which has significantly increased in size compared to prior examination dated 01/09/2013 (series 3, image 64). No hydronephrosis. 3. Aortic Atherosclerosis (ICD10-I70.0). Electronically Signed   By: Eddie Candle M.D.   On: 07/31/2019 13:23       Subjective: Patient seen and examined at bedside.  She feels much better and denies current chest pain.  No overnight fever or vomiting.  Discharge Exam: Vitals:   08/23/19 0853 08/23/19 0856  BP: 125/70   Pulse:  76  Resp:    Temp:    SpO2:      General: Pt is alert, awake, not in acute distress Cardiovascular: rate controlled, S1/S2 + Respiratory: bilateral decreased breath sounds at bases Abdominal: Soft, NT, ND, bowel sounds + Extremities: no edema, no cyanosis    The results of significant diagnostics from this hospitalization (including imaging, microbiology, ancillary and laboratory) are listed below for reference.     Microbiology: Recent Results (from the past 240 hour(s))  SARS CORONAVIRUS 2 (TAT 6-24 HRS) Nasopharyngeal Nasopharyngeal Swab     Status: None   Collection Time: 08/21/19  9:30 PM   Specimen: Nasopharyngeal Swab  Result Value Ref Range Status   SARS Coronavirus 2 NEGATIVE NEGATIVE Final    Comment: (NOTE) SARS-CoV-2 target nucleic acids are NOT DETECTED. The SARS-CoV-2 RNA is generally detectable in upper and lower respiratory specimens during the acute phase of infection. Negative results do not preclude SARS-CoV-2 infection, do not rule out co-infections with other pathogens, and should not be used as the sole basis for treatment or other patient management decisions. Negative results must be combined with clinical observations, patient history, and epidemiological information. The expected result is Negative. Fact Sheet for Patients: SugarRoll.be Fact Sheet for Healthcare Providers: https://www.woods-mathews.com/ This test is not yet  approved or cleared by the [place] FDA and  has been authorized for detection and/or diagnosis of SARS-CoV-2 by FDA under an Emergency Use Authorization (EUA). This EUA will remain  in effect (meaning this test can be used) for the duration of the COVID-19 declaration under Section 56 4(b)(1) of the Act, 21 U.S.C. section 360bbb-3(b)(1), unless the authorization is terminated or revoked sooner. Performed at [facility], [place] [address], [place], [place] [address]      Labs: BNP (last 3 results) No results for input(s): BNP in the last 8760 hours. Basic Metabolic Panel: Recent Labs  Lab 08/16/19 1327 08/21/19 1807 08/22/19 0033 08/22/19 0238  NA 143 138  --  141  K 4.0 3.5  --  3.7  CL 106 106  --  107  CO2 29 24  --  26  GLUCOSE 87 180*  --  111*  BUN 22 22  --  16  CREATININE 1.53* 1.59*  --  1.19*  CALCIUM 10.2 9.1  --  9.1  MG  --   --  1.8  --   PHOS  --   --  2.5  --    Liver Function Tests: Recent Labs  Lab 08/16/19 1327  AST 15  ALT 6  ALKPHOS 101  BILITOT 0.6  PROT 7.6  ALBUMIN 4.2   No results for input(s): LIPASE, AMYLASE in the last 168 hours. No results for input(s): AMMONIA in the last 168 hours. CBC: Recent Labs  Lab 08/16/19 1327 08/21/19 1807 08/22/19 0238  WBC 9.2 9.8 7.5  HGB 14.1 13.6 12.9  HCT 43.1 42.9 38.7  MCV 95.0 97.3 93.9  PLT 193.0 187 162   Cardiac Enzymes: No results for input(s): CKTOTAL, CKMB, CKMBINDEX, TROPONINI in the last 168 hours. BNP: Invalid input(s): POCBNP CBG: Recent Labs  Lab 08/22/19 1733 08/23/19 0739  GLUCAP 111* 202*   D-Dimer No results for input(s): DDIMER in the last 72 hours. Hgb A1c No results for input(s): HGBA1C in the last 72 hours. Lipid Profile No results for input(s): CHOL, HDL, LDLCALC, TRIG, CHOLHDL, LDLDIRECT in the last 72 hours. Thyroid function studies No results for input(s): TSH, T4TOTAL, T3FREE, THYROIDAB in the last 72 hours.  Invalid input(s): FREET3 Anemia  work up No results for input(s): VITAMINB12, FOLATE, FERRITIN, TIBC, IRON, RETICCTPCT in the last 72 hours. Urinalysis    Component Value Date/Time   COLORURINE YELLOW 03/26/2019 1547   APPEARANCEUR CLOUDY (A) 03/26/2019 1547   LABSPEC 1.020 03/26/2019 1547   PHURINE < OR = 5.0 03/26/2019 1547   GLUCOSEU NEGATIVE 03/26/2019 1547   GLUCOSEU 100 (A) 05/12/2016 1626   HGBUR TRACE (A) 03/26/2019 1547   HGBUR moderate 12/23/2009 1623   BILIRUBINUR 1+ 02/12/2019 1429   KETONESUR NEGATIVE 03/26/2019 1547   PROTEINUR NEGATIVE 03/26/2019 1547   UROBILINOGEN 0.2 02/12/2019 1429   UROBILINOGEN 0.2 05/12/2016 1626   NITRITE Neg 02/12/2019 1429   NITRITE NEGATIVE 05/12/2016 1626   LEUKOCYTESUR Negative 02/12/2019 1429   Sepsis Labs Invalid input(s): PROCALCITONIN,  WBC,  LACTICIDVEN Microbiology Recent Results (from the past 240 hour(s))  SARS CORONAVIRUS 2 (TAT 6-24 HRS) Nasopharyngeal Nasopharyngeal Swab     Status: None   Collection Time: 08/21/19  9:30 PM   Specimen: Nasopharyngeal Swab  Result Value Ref Range Status   SARS Coronavirus 2 NEGATIVE NEGATIVE Final    Comment: (NOTE) SARS-CoV-2 target nucleic acids are NOT DETECTED. The SARS-CoV-2 RNA is generally detectable in upper and lower respiratory specimens during the acute phase of infection. Negative results do not preclude SARS-CoV-2 infection, do not rule out co-infections with other pathogens, and should not be used as the sole basis for treatment or other patient management decisions. Negative results must be combined with clinical observations, patient history, and epidemiological information. The expected result is Negative. Fact Sheet for Patients: SugarRoll.be Fact Sheet for Healthcare Providers: https://www.woods-mathews.com/ This test is not yet approved or cleared by the [place] FDA and  has been authorized for detection and/or diagnosis of SARS-CoV-2 by FDA under an  Emergency Use Authorization (EUA). This EUA will remain  in effect (meaning this test can be used) for the duration of the COVID-19 declaration under Section 56 4(b)(1) of the Act, 21 U.S.C. section 360bbb-3(b)(1), unless the authorization is terminated or revoked sooner. Performed at [facility], [place] [address], [place], [place] [address]      Time coordinating discharge: 35 minutes  SIGNED:   Aline August, MD  [facility] 08/23/2019, 10:11 AM

## 2019-08-26 DIAGNOSIS — R3911 Hesitancy of micturition: Secondary | ICD-10-CM | POA: Diagnosis not present

## 2019-08-26 DIAGNOSIS — R3914 Feeling of incomplete bladder emptying: Secondary | ICD-10-CM | POA: Diagnosis not present

## 2019-08-26 DIAGNOSIS — R1031 Right lower quadrant pain: Secondary | ICD-10-CM | POA: Diagnosis not present

## 2019-08-26 DIAGNOSIS — N200 Calculus of kidney: Secondary | ICD-10-CM | POA: Diagnosis not present

## 2019-08-26 DIAGNOSIS — N952 Postmenopausal atrophic vaginitis: Secondary | ICD-10-CM | POA: Diagnosis not present

## 2019-08-27 ENCOUNTER — Telehealth: Payer: Self-pay | Admitting: *Deleted

## 2019-08-27 NOTE — Telephone Encounter (Signed)
Left message for patient to call back. Availability tomorrow am L. Servando Snare, NP 8:30 am.

## 2019-08-27 NOTE — Progress Notes (Signed)
Pt seen by Dr Sharlet Salina   has tightness in chest and SOB    COncern for angina Has appt on 12/31   Is there an opening sooner?

## 2019-08-27 NOTE — Telephone Encounter (Signed)
-----   Message from Fay Records, MD sent at 08/27/2019  9:19 AM EST -----   ----- Message ----- From: Hoyt Koch, MD Sent: 08/16/2019   1:41 PM EST To: Fay Records, MD  Mutual patient: in for new chest tightness and SOB with activity likely anginal equivalent. EKG without changes. Checking BNP and troponin today to rule out other acute cause. If those are negative she likely needs stress testing. Seeing your APP 09/05/19 so hopefully if needed could be done before then. Thanks, Delma Officer

## 2019-08-27 NOTE — Progress Notes (Signed)
CARDIOLOGY OFFICE NOTE  Date:  08/28/2019    Monique [name] Date of Birth: [birth year] Medical Record [id number]  PCP:  Hoyt Koch, MD  Cardiologist:  Harrington Challenger  Chief Complaint  Patient presents with  . Follow-up    Seen for Dr. Harrington Challenger    History of Present Illness: Monique [name] is a 83 y.o. female who presents today for a work in visit. Seen for Dr. Harrington Challenger.   She has a history of HOCM, HTN, PAF, bilateral PE, DVT, CKD stage III and HLD. Negative Myoview in 2019 for chest pain.  Last echo 11/2014 showed LVEF of 65-70%, dynamic obstruction, severe LVH, grade 1 DD, mild MR. She has had prior SAH due to cerebral aneurysm several years ago.   Last seen by Dr. Harrington Challenger back in August - occasional chest pain. Some somatic complaints.   The patient does not have symptoms concerning for COVID-19 infection (fever, chills, cough, or new shortness of breath).   Comes in today. Here alone. Was admitted last week with chest pain - felt to be atypical - troponins negative. Seen by Cardiology. Norpace restarted. Metoprolol increased. Noted chest pain with palpation of the chest but also reported tightness as well. EKG negative. Troponin negative.  Did have an episode SVT while there.  She notes her pain is not as bad now but still present asking what is wrong. Using Lidoderm patches with some relief. Still a little short of breath at times. Does not have PCP follow up scheduled yet. Pretty sedentary at home.     Past Medical History:  Diagnosis Date  . Abdominal pain 11/18/2015  . Acute respiratory failure with hypoxia ([place]) 06/27/2014  . Breast pain, left 10/11/2016  . Cerebral aneurysm 2000 06/27/2014  . Chest pain 10/30/2014  . CHF (congestive heart failure) ([place])   . Chronic anticoagulation 06/2014   Coumadin started after bilateral PE  . Chronic kidney disease, stage III (moderate) 11/21/2014  . Clotting disorder ([place])    right was removed, still have left cataract  . Diabetes  ([place])   . Dizziness and giddiness 11/28/2014  . DVT of lower extremity (deep venous thrombosis) ([place]) 11/28/2014  . Dysuria 07/07/2014  . Elevated troponin 11/28/2014  . Fatty liver   . Gastritis 05/16/2012   Chronic dyspepsia on PPI daily.   Marland Kitchen Headache, acute 06/08/2010   Qualifier: Diagnosis of  By: Linda Hedges MD, Heinz Knuckles   . Heart murmur   . Hematochezia 11/18/2015  . History of fibromuscular dysplasia 06/27/2014  . HTN (hypertension)    Essential  . Hyperlipidemia   . Hypertrophic obstructive cardiomyopathy (HOCM) ([place]) 05/20/2008   Qualifier: Diagnosis of  By: Linda Hedges MD, Heinz Knuckles   . Kidney stones   . LVH (left ventricular hypertrophy)   . Orthostatic hypotension 11/28/2014  . PAF (paroxysmal atrial fibrillation) ([place]) 2000  . Palpitations 11/28/2014  . PAROXYSMAL ATRIAL FIBRILLATION 05/20/2008   Qualifier: Diagnosis of  By: Linda Hedges MD, Heinz Knuckles   . PE (pulmonary embolism) 06/2014   Bilateral  . Postmenopausal osteoporosis 04/2019   T score -3.3  . Pulmonary embolism, bilateral ([place]) 06/27/2014  . SOB (shortness of breath) 06/27/2014  . Subarachnoid hemorrhage ([place]) 2000  . UTI (urinary tract infection) 01/04/2015    Past Surgical History:  Procedure Laterality Date  . ABDOMINAL HYSTERECTOMY  2000  . Brain hemorrage     2000  . CARDIAC CATHETERIZATION  05/2008   Nl cors, no gradient, EF 60%  Medications: Current Meds  Medication Sig  . acetaminophen (TYLENOL) 500 MG tablet Take 1,000 mg by mouth.  Marland Kitchen alum & mag hydroxide-simeth (MAALOX/MYLANTA) 200-200-20 MG/5ML suspension Take 15 mLs by mouth every 4 (four) hours as needed for indigestion or heartburn.  Marland Kitchen amLODipine (NORVASC) 5 MG tablet Take 5 mg by mouth 2 (two) times daily.  Marland Kitchen atorvastatin (LIPITOR) 20 MG tablet TAKE 1 TABLET (20 MG TOTAL) BY MOUTH DAILY AT 6 PM.  . disopyramide (NORPACE) 150 MG capsule TAKE 1 CAPSULE (150 MG TOTAL) BY MOUTH 2 (TWO) TIMES DAILY.  Marland Kitchen lidocaine (LIDODERM) 5 % Place 1 patch onto the skin  daily for 10 days. Remove & Discard patch within 12 hours or as directed by MD  . metFORMIN (GLUCOPHAGE) 500 MG tablet Take 500 mg by mouth daily with breakfast.  . metoprolol tartrate (LOPRESSOR) 50 MG tablet Take 1 tablet (50 mg total) by mouth 2 (two) times daily.  . mirtazapine (REMERON) 15 MG tablet Take 15 mg by mouth at bedtime.  . nitroGLYCERIN (NITROSTAT) 0.4 MG SL tablet PLACE 1 TABLET (0.4 MG TOTAL) UNDER THE TONGUE EVERY 5 (FIVE) MINUTES AS NEEDED FOR CHEST PAIN.  Marland Kitchen pantoprazole (PROTONIX) 40 MG tablet Take 40 mg by mouth daily.  . Rivaroxaban (XARELTO) 15 MG TABS tablet Take 15 mg by mouth daily with supper.  . sertraline (ZOLOFT) 50 MG tablet Take 50 mg by mouth daily.  . [DISCONTINUED] metoprolol tartrate (LOPRESSOR) 50 MG tablet Take 1 tablet (50 mg total) by mouth 2 (two) times daily.     Allergies: No Known Allergies  Social History: The patient  reports that she quit smoking about 52 years ago. She has never used smokeless tobacco. She reports that she does not drink alcohol or use drugs.   Family History: The patient's family history includes Arrhythmia in her mother and sister; Breast cancer (age of onset: 40) in her daughter; Breast cancer (age of onset: 43) in her mother; Heart disease in her mother; Stroke in her mother.   Review of Systems: Please see the history of present illness.   All other systems are reviewed and negative.   Physical Exam: VS:  BP 122/80   Pulse (!) 53   Ht 5\' 6"  (1.676 m)   Wt 145 lb 12.8 oz (66.1 kg)   SpO2 98%   BMI 23.53 kg/m  .  BMI Body mass index is 23.53 kg/m.  Wt Readings from Last 3 Encounters:  08/28/19 145 lb 12.8 oz (66.1 kg)  08/23/19 141 lb 3.2 oz (64 kg)  08/16/19 148 lb (67.1 kg)    General: Pleasant. Alert and in no acute distress.   HEENT: Normal.  Neck: Supple, no JVD, carotid bruits, or masses noted.  Cardiac: Regular rate and rhythm. No murmurs, rubs, or gallops. No edema. Still with some palpable chest  wall pain.  Respiratory:  Lungs are clear to auscultation bilaterally with normal work of breathing.  GI: Soft and nontender.  MS: No deformity or atrophy. Gait and ROM intact.  Skin: Warm and dry. Color is normal.  Neuro:  Strength and sensation are intact and no gross focal deficits noted.  Psych: Alert, appropriate and with normal affect.   LABORATORY DATA:  EKG:  EKG is ordered today. This demonstrates sinus brady - HR is 53 - LVH. QT little longer at 528 ms.   Lab Results  Component Value Date   WBC 7.5 08/22/2019   HGB 12.9 08/22/2019   HCT 38.7 08/22/2019  PLT 162 08/22/2019   GLUCOSE 111 (H) 08/22/2019   CHOL 117 07/16/2019   TRIG 65.0 07/16/2019   HDL 49.80 07/16/2019   LDLCALC 54 07/16/2019   ALT 6 08/16/2019   AST 15 08/16/2019   NA 141 08/22/2019   K 3.7 08/22/2019   CL 107 08/22/2019   CREATININE 1.19 (H) 08/22/2019   BUN 16 08/22/2019   CO2 26 08/22/2019   TSH 2.660 04/29/2019   INR 2.0 (H) 11/18/2015   HGBA1C 7.0 (H) 07/16/2019   MICROALBUR 80 02/12/2019     BNP (last 3 results) No results for input(s): BNP in the last 8760 hours.  ProBNP (last 3 results) Recent Labs    08/16/19 1327  PROBNP 227.0*     Other Studies Reviewed Today:  Echo Study Conclusions 09/2017  - Left ventricle: The cavity size was normal. Wall thickness was   increased in a pattern of moderate LVH. Systolic function was   vigorous. The estimated ejection fraction was in the range of 65%   to 70%. There is an intracavitary gradient created by asymmetic   LVH, mild SAM, and hyperdnamic systolic function. Difficult   assessment of peak gradient from available tracings, peak   gradient 34 mmHg. No provocative maneuvers were performed.   Doppler parameters are consistent with abnormal left ventricular   relaxation (grade 1 diastolic dysfunction). - Aortic valve: Mildly calcified annulus. Mildly thickened   leaflets. Mean gradient (S): 21 mm Hg. Morphologically the valve    looks normal, the measured gradient appears to be subvalvular   from her dynamic intracaviatry gradient. Valve area (VTI): 1.55   cm^2. Valve area (Vmax): 2.05 cm^2. - Mitral valve: Mildly calcified annulus. Normal thickness leaflets   . - Technically difficult study.   Myoview 09/2017  There was no ST segment deviation noted during stress.  No T wave inversion was noted during stress.  Defect 1: There is a small defect of mild severity present in the apical lateral location.  This is a low risk study.  Nuclear stress EF: 64%.   Low risk stress nuclear study with normal perfusion and normal left ventricular regional and global systolic function.   ASSESSMENT & PLAN:   1. Chest pain - seems more musculoskeletal but also reporting a sensation of tightness - will get Myoview updated. Ok to continue Lidoderm and use Tylenol prn.  Recent admission with negative troponins. Repeat EKG on return.   2. PAF - in sinus  3. HOCM - will get echo updated.   4. HTN - BP is fine on current regimen.   5. Prior PE/DVT - on Xarelto - no problems noted. CBC ok.   6. HLD - on statin  7. CKD stage III - recent lab noted.   8. SVT - noted while admitted - metoprolol has been increased.   9. DM - per PCP  10. Bradycardia - on more Metoprolol - QT little longer - repeat EKG on return visit.   37. COVID-19 Education: The signs and symptoms of COVID-19 were discussed with the patient and how to seek care for testing (follow up with PCP or arrange E-visit).  The importance of social distancing, staying at home, hand hygiene and wearing a mask when out in public were discussed today.  Current medicines are reviewed with the patient today.  The patient does not have concerns regarding medicines other than what has been noted above.  The following changes have been made:  See above.  Labs/ tests ordered  today include:    Orders Placed This Encounter  Procedures  . MYOCARDIAL PERFUSION  IMAGING  . EKG 12-Lead  . ECHOCARDIOGRAM COMPLETE     Disposition:   FU with Vin as planned next month with repeat EKG.  Patient is agreeable to this plan and will call if any problems develop in the interim.   SignedTruitt Merle, NP  08/28/2019 9:10 AM  [facility] [address] [place], [place]  [address] Phone: [phone] Fax: [phone]

## 2019-08-28 ENCOUNTER — Encounter: Payer: Self-pay | Admitting: Nurse Practitioner

## 2019-08-28 ENCOUNTER — Telehealth (HOSPITAL_COMMUNITY): Payer: Self-pay | Admitting: *Deleted

## 2019-08-28 ENCOUNTER — Ambulatory Visit: Payer: Medicare Other | Admitting: Nurse Practitioner

## 2019-08-28 ENCOUNTER — Other Ambulatory Visit: Payer: Self-pay

## 2019-08-28 VITALS — BP 122/80 | HR 53 | Ht 66.0 in | Wt 145.8 lb

## 2019-08-28 DIAGNOSIS — E782 Mixed hyperlipidemia: Secondary | ICD-10-CM

## 2019-08-28 DIAGNOSIS — I421 Obstructive hypertrophic cardiomyopathy: Secondary | ICD-10-CM | POA: Diagnosis not present

## 2019-08-28 DIAGNOSIS — I259 Chronic ischemic heart disease, unspecified: Secondary | ICD-10-CM

## 2019-08-28 MED ORDER — METOPROLOL TARTRATE 50 MG PO TABS: 50.0000 mg | ORAL_TABLET | Freq: Two times a day (BID) | ORAL | 3 refills | Status: DC

## 2019-08-28 NOTE — Patient Instructions (Addendum)
After Visit Summary:  We will be checking the following labs today - NONE   Medication Instructions:    Continue with your current medicines.    If you need a refill on your cardiac medications before your next appointment, please call your pharmacy.     Testing/Procedures To Be Arranged:  Myoview  Echocardiogram  Follow-Up:   See Vin as planned next month here  Make appointment to see PCP    At [facility], you and your health needs are our priority.  As part of our continuing mission to provide you with exceptional heart care, we have created designated Provider Care Teams.  These Care Teams include your primary Cardiologist (physician) and Advanced Practice Providers (APPs -  Physician Assistants and Nurse Practitioners) who all work together to provide you with the care you need, when you need it.  Special Instructions:  . Stay safe, stay home, wash your hands for at least 20 seconds and wear a mask when out in public.  . It was good to talk with you today.    You are scheduled for a Myocardial Perfusion Imaging Study on _____________________________ at _________________________.   Please arrive 15 minutes prior to your appointment time for registration and insurance purposes.   The test will take approximately 3 to 4 hours to complete; you may bring reading material. If someone comes with you to your appointment, they will need to remain in the main lobby due to limited space in the testing area.    How to prepare for your Myocardial Perfusion test:   Do not eat or drink 3 hours prior to your test, except you may have water.    Do not consume products containing caffeine (regular or decaffeinated) 12 hours prior to your test (ex: coffee, chocolate, soda, tea)   Do bring a list of your current medications with you. If not listed below, you may take your medications as normal.    Bring any held medication to your appointment, as you may be required to take it  once the test is complete.   Do wear comfortable clothes (no dresses or overalls) and walking shoes. Tennis shoes are preferred. No heels or open toed shoes.  Do not wear cologne, perfume, aftershave or lotions (deodorant is allowed).   If these instructions are not followed, you test will have to be rescheduled.   Please report to [address] for your test. If you have questions or concerns about your appointment, please call the Nuclear Lab at [phone].  If you cannot keep your appointment, please provide 24 hour notification to the Nuclear lab to avoid a possible $50 charge to your account.        Call the [place] office at [phone] if you have any questions, problems or concerns.

## 2019-08-28 NOTE — Telephone Encounter (Signed)
Patient given detailed instructions per Myocardial Perfusion Study Information Sheet for the test on 09/04/19 Patient notified to arrive 15 minutes early and that it is imperative to arrive on time for appointment to keep from having the test rescheduled.  If you need to cancel or reschedule your appointment, please call the office within 24 hours of your appointment. . Patient verbalized understanding. Kirstie Peri

## 2019-09-03 ENCOUNTER — Encounter: Payer: Self-pay | Admitting: Internal Medicine

## 2019-09-03 ENCOUNTER — Other Ambulatory Visit: Payer: Self-pay

## 2019-09-03 ENCOUNTER — Ambulatory Visit (INDEPENDENT_AMBULATORY_CARE_PROVIDER_SITE_OTHER): Payer: Medicare Other | Admitting: Internal Medicine

## 2019-09-03 DIAGNOSIS — R0789 Other chest pain: Secondary | ICD-10-CM | POA: Diagnosis not present

## 2019-09-03 MED ORDER — LIDOCAINE 5 % EX PTCH: 1.0000 | MEDICATED_PATCH | CUTANEOUS | 3 refills | Status: DC

## 2019-09-03 NOTE — Assessment & Plan Note (Signed)
Getting stress test tomorrow and echo early January. Refill lidoderm patches for pain relief. No labs indicated.

## 2019-09-03 NOTE — Progress Notes (Signed)
   Subjective:   Patient ID: Monique [name], female    DOB: 1935/12/18, 83 y.o.   MRN: [id number]  HPI The patient is an 83 YO female coming in for hospital follow up (in for chest pain/tightness with PSVT, PAF on xarelto, metoprolol increased at the hospital, getting stress test tomorrow, echo early January). Since being home she is doing well. Had 2-3 days without chest pain but it is back this morning. She put on the lidoderm patch and this will help but has not helped yet. Typically it takes several hours to work for her. She denies fevers or chills. Denies SOB or cough. Does have the chest tightness. Denies changes to bowels or abdominal symptoms. Eating well and sleeping okay. Denies mobility issues or falls.   PMH, [facility], social history reviewed and updated.   Review of Systems  Constitutional: Negative.   HENT: Negative.   Eyes: Negative.   Respiratory: Positive for chest tightness. Negative for cough and shortness of breath.   Cardiovascular: Positive for chest pain. Negative for palpitations and leg swelling.  Gastrointestinal: Negative for abdominal distention, abdominal pain, constipation, diarrhea, nausea and vomiting.  Musculoskeletal: Negative.   Skin: Negative.   Neurological: Negative.   Psychiatric/Behavioral: Negative.     Objective:  Physical Exam Constitutional:      Appearance: She is well-developed.  HENT:     Head: Normocephalic and atraumatic.  Cardiovascular:     Rate and Rhythm: Normal rate and regular rhythm.     Comments: lidoderm patch on, pain not reproducible on exam Pulmonary:     Effort: Pulmonary effort is normal. No respiratory distress.     Breath sounds: Normal breath sounds. No wheezing or rales.  Abdominal:     General: Bowel sounds are normal. There is no distension.     Palpations: Abdomen is soft.     Tenderness: There is no abdominal tenderness. There is no rebound.  Musculoskeletal:     Cervical back: Normal range of motion.  Skin:    General: Skin is warm and dry.  Neurological:     Mental Status: She is alert and oriented to person, place, and time.     Coordination: Coordination normal.     Vitals:   09/03/19 0855  BP: 124/80  Pulse: 60  Temp: 98.1 F (36.7 C)  TempSrc: Oral  SpO2: 98%  Weight: 144 lb (65.3 kg)  Height: 5\' 6"  (1.676 m)    This visit occurred during the SARS-CoV-2 public health emergency.  Safety protocols were in place, including screening questions prior to the visit, additional usage of staff PPE, and extensive cleaning of exam room while observing appropriate contact time as indicated for disinfecting solutions.   Assessment & Plan:

## 2019-09-03 NOTE — Patient Instructions (Signed)
We have sent in the refill of the patches.   We will see the results of the stress test and the ultrasound (echo) of the heart.

## 2019-09-04 ENCOUNTER — Ambulatory Visit (HOSPITAL_COMMUNITY): Payer: Medicare Other | Attending: Internal Medicine

## 2019-09-04 DIAGNOSIS — I259 Chronic ischemic heart disease, unspecified: Secondary | ICD-10-CM | POA: Insufficient documentation

## 2019-09-04 DIAGNOSIS — E782 Mixed hyperlipidemia: Secondary | ICD-10-CM

## 2019-09-04 DIAGNOSIS — I421 Obstructive hypertrophic cardiomyopathy: Secondary | ICD-10-CM

## 2019-09-04 LAB — MYOCARDIAL PERFUSION IMAGING
LV dias vol: 62 mL (ref 46–106)
LV sys vol: 22 mL
Peak HR: 97 {beats}/min
Rest HR: 69 {beats}/min
SDS: 0
SRS: 0
SSS: 0
TID: 1.02

## 2019-09-04 MED ORDER — REGADENOSON 0.4 MG/5ML IV SOLN
0.4000 mg | Freq: Once | INTRAVENOUS | Status: AC
  Administered 2019-09-04: 0.4 mg via INTRAVENOUS

## 2019-09-04 MED ORDER — TECHNETIUM TC 99M TETROFOSMIN IV KIT
10.5000 | PACK | Freq: Once | INTRAVENOUS | Status: AC | PRN
  Administered 2019-09-04: 10.5 via INTRAVENOUS
  Filled 2019-09-04: qty 11

## 2019-09-04 MED ORDER — TECHNETIUM TC 99M TETROFOSMIN IV KIT
31.7000 | PACK | Freq: Once | INTRAVENOUS | Status: AC | PRN
  Administered 2019-09-04: 31.7 via INTRAVENOUS
  Filled 2019-09-04: qty 32

## 2019-09-05 ENCOUNTER — Ambulatory Visit: Payer: Medicare Other | Admitting: Cardiology

## 2019-09-05 ENCOUNTER — Telehealth: Payer: Self-pay

## 2019-09-05 NOTE — Telephone Encounter (Signed)
The patient has been notified of the Stress Test result and verbalized understanding.  All questions (if any) were answered. Frederik Schmidt, RN 09/05/2019 8:02 AM

## 2019-09-05 NOTE — Telephone Encounter (Signed)
-----   Message from Burtis Junes, NP sent at 09/04/2019  7:09 PM EST ----- Ok to report. Low risk stress test. Normal pumping function and no ischemia.  Will see what the echo shows.

## 2019-09-10 ENCOUNTER — Other Ambulatory Visit (HOSPITAL_COMMUNITY): Payer: Medicare Other

## 2019-09-10 ENCOUNTER — Encounter (HOSPITAL_COMMUNITY): Payer: Self-pay

## 2019-09-14 ENCOUNTER — Other Ambulatory Visit: Payer: Self-pay | Admitting: Internal Medicine

## 2019-09-16 ENCOUNTER — Other Ambulatory Visit: Payer: Self-pay

## 2019-09-16 ENCOUNTER — Ambulatory Visit (HOSPITAL_COMMUNITY): Payer: Medicare Other | Attending: Cardiovascular Disease

## 2019-09-16 DIAGNOSIS — I259 Chronic ischemic heart disease, unspecified: Secondary | ICD-10-CM | POA: Insufficient documentation

## 2019-09-16 DIAGNOSIS — E782 Mixed hyperlipidemia: Secondary | ICD-10-CM | POA: Insufficient documentation

## 2019-09-16 DIAGNOSIS — I421 Obstructive hypertrophic cardiomyopathy: Secondary | ICD-10-CM | POA: Diagnosis not present

## 2019-09-16 NOTE — Progress Notes (Signed)
Cardiology Office Note    Date:  09/19/2019   ID:  Kaneshia Fornshell, DOB 09-Mar-1936, MRN [id number]  PCP:  Hoyt Koch, MD  Cardiologist:  Dr. Harrington Challenger  Chief Complaint: 2 weeks  follow up  History of Present Illness:   Monique [name] is a 84 y.o. female with hx of history of HOCM, HTN, PAF, bilateral PE, DVT, CKD stage III, hx of SAH due to cerebral aneurysm  and HLD seen for follow up.   Her last ischemic evaluation was a NST 09/2017 which was a low risk study with normal perfusion and normal LV function. She had a LHC in 2009 which was without CAD, normal LV function, and no outflow tract gradient.   Admitted 08/2019 with chest pain. Felt atypical. Normal troponin. Episode of SVT. Restarted her disopyramide/Norpace and increased her metoprolol to 50 mg twice a day. Seen by APP 08/28/19. CP felt atypical. Outpatient stress test low risk with normal perfusion. Echo 09/16/19 showed LVEF of 65-70%, increased LVH, grade 2 DD, no obvious asymmetric hypertrophy or SAM compared to prior echo of 09/2017.  Here today for follow up.  She continues to have intermittent chest discomfort.  Unable to describe character.  Her symptoms last for minutes to hours to days.  She took sublingual nitroglycerin last week without any improvement.  She reports burping and lidocaine patch helps her symptoms.  No shortness of breath, dizziness, palpitation, orthopnea, PND or syncope.  Compliant with her medication.   Past Medical History:  Diagnosis Date  . Abdominal pain 11/18/2015  . Acute respiratory failure with hypoxia ([place]) 06/27/2014  . Breast pain, left 10/11/2016  . Cerebral aneurysm 2000 06/27/2014  . Chest pain 10/30/2014  . CHF (congestive heart failure) ([place])   . Chronic anticoagulation 06/2014   Coumadin started after bilateral PE  . Chronic kidney disease, stage III (moderate) 11/21/2014  . Clotting disorder ([place])    right was removed, still have left cataract  . Diabetes ([place])   .  Dizziness and giddiness 11/28/2014  . DVT of lower extremity (deep venous thrombosis) ([place]) 11/28/2014  . Dysuria 07/07/2014  . Elevated troponin 11/28/2014  . Fatty liver   . Gastritis 05/16/2012   Chronic dyspepsia on PPI daily.   Marland Kitchen Headache, acute 06/08/2010   Qualifier: Diagnosis of  By: Linda Hedges MD, Heinz Knuckles   . Heart murmur   . Hematochezia 11/18/2015  . History of fibromuscular dysplasia 06/27/2014  . HTN (hypertension)    Essential  . Hyperlipidemia   . Hypertrophic obstructive cardiomyopathy (HOCM) ([place]) 05/20/2008   Qualifier: Diagnosis of  By: Linda Hedges MD, Heinz Knuckles   . Kidney stones   . LVH (left ventricular hypertrophy)   . Orthostatic hypotension 11/28/2014  . PAF (paroxysmal atrial fibrillation) ([place]) 2000  . Palpitations 11/28/2014  . PAROXYSMAL ATRIAL FIBRILLATION 05/20/2008   Qualifier: Diagnosis of  By: Linda Hedges MD, Heinz Knuckles   . PE (pulmonary embolism) 06/2014   Bilateral  . Postmenopausal osteoporosis 04/2019   T score -3.3  . Pulmonary embolism, bilateral ([place]) 06/27/2014  . SOB (shortness of breath) 06/27/2014  . Subarachnoid hemorrhage ([place]) 2000  . UTI (urinary tract infection) 01/04/2015    Past Surgical History:  Procedure Laterality Date  . ABDOMINAL HYSTERECTOMY  2000  . Brain hemorrage     2000  . CARDIAC CATHETERIZATION  05/2008   Nl cors, no gradient, EF 60%    Current Medications: Prior to Admission medications   Medication Sig Start Date End  Date Taking? Authorizing Provider  acetaminophen (TYLENOL) 500 MG tablet Take 1,000 mg by mouth.    [provider]  alum & mag hydroxide-simeth (MAALOX/MYLANTA) 200-200-20 MG/5ML suspension Take 15 mLs by mouth every 4 (four) hours as needed for indigestion or heartburn. 08/23/19   Aline August, MD  amLODipine (NORVASC) 5 MG tablet Take 5 mg by mouth 2 (two) times daily.    [provider]  atorvastatin (LIPITOR) 20 MG tablet TAKE 1 TABLET (20 MG TOTAL) BY MOUTH DAILY AT 6 PM. 08/05/19   Burns,  Claudina Lick, MD  disopyramide (NORPACE) 150 MG capsule TAKE 1 CAPSULE (150 MG TOTAL) BY MOUTH 2 (TWO) TIMES DAILY. 07/17/19   Fay Records, MD  lidocaine (LIDODERM) 5 % Place 1 patch onto the skin daily. 09/03/19   Hoyt Koch, MD  metFORMIN (GLUCOPHAGE) 500 MG tablet TAKE 1 TABLET (500 MG TOTAL) BY MOUTH DAILY WITH BREAKFAST. 09/16/19   Hoyt Koch, MD  metoprolol tartrate (LOPRESSOR) 50 MG tablet Take 1 tablet (50 mg total) by mouth 2 (two) times daily. 08/28/19   Burtis Junes, NP  mirtazapine (REMERON) 15 MG tablet Take 15 mg by mouth at bedtime.    [provider]  nitroGLYCERIN (NITROSTAT) 0.4 MG SL tablet PLACE 1 TABLET (0.4 MG TOTAL) UNDER THE TONGUE EVERY 5 (FIVE) MINUTES AS NEEDED FOR CHEST PAIN. 10/01/18   Joyel Chenette, PA  pantoprazole (PROTONIX) 40 MG tablet Take 40 mg by mouth daily.    [provider]  Rivaroxaban (XARELTO) 15 MG TABS tablet Take 15 mg by mouth daily with supper.    [provider]  sertraline (ZOLOFT) 50 MG tablet Take 50 mg by mouth daily.    [provider]    Allergies:   Patient has no known allergies.   Social History   Socioeconomic History  . Marital status: Divorced    Spouse name: Not on file  . Number of children: 3  . Years of education: 40  . Highest education level: Not on file  Occupational History  . Occupation: Retired [employer]  . Smoking status: Former Smoker    Quit date: 09/06/1967    Years since quitting: 52.0  . Smokeless tobacco: Never Used  Substance and Sexual Activity  . Alcohol use: No    Alcohol/week: 0.0 standard drinks  . Drug use: No  . Sexual activity: Not Currently    Comment: 1st intercourse 84yo-Fewer than 5 partners  Other Topics Concern  . Not on file  Social History Narrative   HSG. Married '61 - '88/divorced. 3 sons - '74, '59, '62, 1 daughter '63; 5 grandchildren. Lives alone in two story home. She does not drive.   Retired from  [employer].   Social Determinants of Health   Financial Resource Strain:   . Difficulty of Paying Living Expenses: Not on file  Food Insecurity:   . Worried About Charity fundraiser in the Last Year: Not on file  . Ran Out of Food in the Last Year: Not on file  Transportation Needs:   . Lack of Transportation (Medical): Not on file  . Lack of Transportation (Non-Medical): Not on file  Physical Activity:   . Days of Exercise per Week: Not on file  . Minutes of Exercise per Session: Not on file  Stress:   . Feeling of Stress : Not on file  Social Connections:   . Frequency of Communication with Friends and Family:  Not on file  . Frequency of Social Gatherings with Friends and Family: Not on file  . Attends Religious Services: Not on file  . Active Member of Clubs or Organizations: Not on file  . Attends Archivist Meetings: Not on file  . Marital Status: Not on file     Family History:  The patient's family history includes Arrhythmia in her mother and sister; Breast cancer (age of onset: 70) in her daughter; Breast cancer (age of onset: 31) in her mother; Heart disease in her mother; Stroke in her mother.   ROS:   Please see the history of present illness.    ROS All other systems reviewed and are negative.   PHYSICAL EXAM:   VS:  BP 126/82   Pulse (!) 54   Ht 5\' 6"  (1.676 m)   Wt 144 lb (65.3 kg)   SpO2 98%   BMI 23.24 kg/m    GEN: Well nourished, well developed, in no acute distress  HEENT: normal  Neck: no JVD, carotid bruits, or masses Cardiac: RRR; no murmurs, rubs, or gallops,no edema  Respiratory:  clear to auscultation bilaterally, normal work of breathing GI: soft, nontender, nondistended, + BS MS: no deformity or atrophy  Skin: warm and dry, no rash Neuro:  Alert and Oriented x 3, Strength and sensation are intact Psych: euthymic mood, full affect  Wt Readings from Last 3 Encounters:  09/19/19 144 lb (65.3 kg)  09/04/19 145  lb (65.8 kg)  09/03/19 144 lb (65.3 kg)      Studies/Labs Reviewed:   EKG:  EKG is ordered today.  The ekg ordered today demonstrates sinus bradycardia at rate of 51 bpm, LVH, T wave inversion in lead I and aVL, no change  Recent Labs: 04/29/2019: TSH 2.660 08/16/2019: ALT 6; Pro B Natriuretic peptide (BNP) 227.0 08/22/2019: BUN 16; Creatinine, Ser 1.19; Hemoglobin 12.9; Magnesium 1.8; Platelets 162; Potassium 3.7; Sodium 141   Lipid Panel    Component Value Date/Time   CHOL 117 07/16/2019 0907   CHOL 140 11/11/2016 0900   TRIG 65.0 07/16/2019 0907   HDL 49.80 07/16/2019 0907   HDL 51 11/11/2016 0900   CHOLHDL 2 07/16/2019 0907   VLDL 13.0 07/16/2019 0907   LDLCALC 54 07/16/2019 0907   LDLCALC 65 11/11/2016 0900    Additional studies/ records that were reviewed today include:   Echocardiogram: 09/2019  1. Left ventricular ejection fraction, by visual estimation, is 65 to 70%. The left ventricle has normal function. There is mildly increased left ventricular hypertrophy.  2. Elevated left atrial pressure.  3. Left ventricular diastolic parameters are consistent with Grade II diastolic dysfunction (pseudonormalization).  4. The left ventricle has no regional wall motion abnormalities.  5. Intracavitary gradient up to 5 mmHG.  6. Global right ventricle has normal systolic function.The right ventricular size is normal. No increase in right ventricular wall thickness.  7. Left atrial size was mildly dilated.  8. The mitral valve is grossly normal. Trivial mitral valve regurgitation.  9. The tricuspid valve is grossly normal. 10. The aortic valve is tricuspid. Aortic valve regurgitation is not visualized. No evidence of aortic valve sclerosis or stenosis. 11. Mildly elevated pulmonary artery systolic pressure. 12. The tricuspid regurgitant velocity is 3.00 m/s, and with an assumed right atrial pressure of 10 mmHg, the estimated right ventricular systolic pressure is mildly elevated  at 45.9 mmHg.  Stress test 08/2019  The left ventricular ejection fraction is normal (55-65%).  Nuclear stress EF: 65%.  No T wave inversion was noted during stress.  There was no ST segment deviation noted during stress.  This is a low risk study.   Normal perfusion. LVEF 65% with normal wall motion. This is a low risk study.    ASSESSMENT & PLAN:    1. Chest discomfort Her chest pain feels atypical for cardiac etiology.  Her pain did not improve after sublingual nitroglycerin trial last week.  Echocardiogram showed 65 to 70% LV function, grade 2 diastolic dysfunction and mildly elevated pulmonary artery systolic pressure.  Stress test low risk with normal perfusion.  Her pain is not reproducible with palpation.  However her chest discomfort feels better after burping sensation as well as on lidocaine patch.  She takes Protonix daily.  I have discussed addition of low-dose Imdur as trial versus waiting and follow-up with PCP.  She wants to follow-up with PCP for further evaluation.  Low suspicion for angina.  2.  Paroxysmal atrial fibrillation -Maintaining sinus rhythm at slow ventricular rate.  She denies palpitation.  Continue Lopressor 50 mg twice daily and Xarelto 15 mg daily.  No bleeding issue.    Medication Adjustments/Labs and Tests Ordered: Current medicines are reviewed at length with the patient today.  Concerns regarding medicines are outlined above.  Medication changes, Labs and Tests ordered today are listed in the Patient Instructions below. Patient Instructions  Medication Instructions:  Your physician recommends that you continue on your current medications as directed. Please refer to the Current Medication list given to you today.  *If you need a refill on your cardiac medications before your next appointment, please call your pharmacy*  Lab Work: NONE If you have labs (blood work) drawn today and your tests are completely normal, you will receive your  results only by: Marland Kitchen MyChart Message (if you have MyChart) OR . A paper copy in the mail If you have any lab test that is abnormal or we need to change your treatment, we will call you to review the results.  Testing/Procedures: NONE  Follow-Up: At [facility] you and your health needs are our priority.  As part of our continuing mission to provide you with exceptional heart care, we have created designated Provider Care Teams.  These Care Teams include your primary Cardiologist (physician) and Advanced Practice Providers (APPs -  Physician Assistants and Nurse Practitioners) who all work together to provide you with the care you need, when you need it.  Your next appointment:   6 week(s)  The format for your next appointment:   Virtual Visit   Provider:   DR. Devoria Glassing, PA  09/19/2019 10:43 AM    [facility] [address], [place], [place]  [address] Phone: [phone]; Fax: [phone]

## 2019-09-19 ENCOUNTER — Ambulatory Visit: Payer: Medicare Other | Admitting: Physician Assistant

## 2019-09-19 ENCOUNTER — Other Ambulatory Visit: Payer: Self-pay

## 2019-09-19 ENCOUNTER — Encounter: Payer: Self-pay | Admitting: Physician Assistant

## 2019-09-19 VITALS — BP 126/82 | HR 54 | Ht 66.0 in | Wt 144.0 lb

## 2019-09-19 DIAGNOSIS — I259 Chronic ischemic heart disease, unspecified: Secondary | ICD-10-CM | POA: Diagnosis not present

## 2019-09-19 DIAGNOSIS — I480 Paroxysmal atrial fibrillation: Secondary | ICD-10-CM | POA: Diagnosis not present

## 2019-09-19 DIAGNOSIS — I421 Obstructive hypertrophic cardiomyopathy: Secondary | ICD-10-CM

## 2019-09-19 DIAGNOSIS — I10 Essential (primary) hypertension: Secondary | ICD-10-CM

## 2019-09-19 NOTE — Patient Instructions (Signed)
Medication Instructions:  Your physician recommends that you continue on your current medications as directed. Please refer to the Current Medication list given to you today.  *If you need a refill on your cardiac medications before your next appointment, please call your pharmacy*  Lab Work: NONE If you have labs (blood work) drawn today and your tests are completely normal, you will receive your results only by: Marland Kitchen MyChart Message (if you have MyChart) OR . A paper copy in the mail If you have any lab test that is abnormal or we need to change your treatment, we will call you to review the results.  Testing/Procedures: NONE  Follow-Up: At [facility], you and your health needs are our priority.  As part of our continuing mission to provide you with exceptional heart care, we have created designated Provider Care Teams.  These Care Teams include your primary Cardiologist (physician) and Advanced Practice Providers (APPs -  Physician Assistants and Nurse Practitioners) who all work together to provide you with the care you need, when you need it.  Your next appointment:   6 week(s)  The format for your next appointment:   Virtual Visit   Provider:   DR. Harrington Challenger

## 2019-09-19 NOTE — Telephone Encounter (Signed)
Office visit today with APP.

## 2019-09-23 ENCOUNTER — Ambulatory Visit: Payer: Medicare Other | Admitting: Internal Medicine

## 2019-10-15 ENCOUNTER — Other Ambulatory Visit: Payer: Self-pay | Admitting: Internal Medicine

## 2019-10-15 NOTE — Telephone Encounter (Signed)
Xarelto 15mg  refill request received. Pt is 84 years old, weight-65.3kg, Crea-1.19 on 08/22/2019, last seen by Vin on 09/19/2019, Diagnosis-Afib, PE, DVT, CrCl-38.48ml/min; Dose is appropriate based on dosing criteria. Will send in refill to requested pharmacy.

## 2019-11-01 ENCOUNTER — Other Ambulatory Visit: Payer: Self-pay

## 2019-11-01 ENCOUNTER — Encounter: Payer: Self-pay | Admitting: Internal Medicine

## 2019-11-01 ENCOUNTER — Telehealth (INDEPENDENT_AMBULATORY_CARE_PROVIDER_SITE_OTHER): Payer: Medicare Other | Admitting: Internal Medicine

## 2019-11-01 DIAGNOSIS — I10 Essential (primary) hypertension: Secondary | ICD-10-CM | POA: Diagnosis not present

## 2019-11-01 DIAGNOSIS — I422 Other hypertrophic cardiomyopathy: Secondary | ICD-10-CM | POA: Diagnosis not present

## 2019-11-01 DIAGNOSIS — R0789 Other chest pain: Secondary | ICD-10-CM

## 2019-11-01 NOTE — Patient Instructions (Addendum)
Medication Instructions:  No changes *If you need a refill on your cardiac medications before your next appointment, please call your pharmacy*   Lab Work: None  Testing/Procedures: none   Follow-Up: At [facility], you and your health needs are our priority.  As part of our continuing mission to provide you with exceptional heart care, we have created designated Provider Care Teams.  These Care Teams include your primary Cardiologist (physician) and Advanced Practice Providers (APPs -  Physician Assistants and Nurse Practitioners) who all work together to provide you with the care you need, when you need it.  We recommend signing up for the patient portal called "MyChart".  Sign up information is provided on this After Visit Summary.  MyChart is used to connect with patients for Virtual Visits (Telemedicine).  Patients are able to view lab/test results, encounter notes, upcoming appointments, etc.  Non-urgent messages can be sent to your provider as well.   To learn more about what you can do with MyChart, go to [email or web address].    Your next appointment:   6 week(s)---April 5 with Dr. Harrington Challenger, 11:20 am  The format for your next appointment:   Virtual Visit   Provider:   Dorris Carnes, MD   Other Instructions

## 2019-11-01 NOTE — Progress Notes (Signed)
Virtual Visit via Telephone Note   This visit type was conducted due to national recommendations for restrictions regarding the COVID-19 Pandemic (e.g. social distancing) in an effort to limit this patient's exposure and mitigate transmission in our community.  Due to her co-morbid illnesses, this patient is at least at moderate risk for complications without adequate follow up.  This format is felt to be most appropriate for this patient at this time.  The patient did not have access to video technology/had technical difficulties with video requiring transitioning to audio format only (telephone).  All issues noted in this document were discussed and addressed.  No physical exam could be performed with this format.  Please refer to the patient's chart for her  consent to telehealth for [facility].   Date:  11/01/2019   ID:  Monique [name], DOB [birth year], MRN [id number]  Patient Location: Home Provider Location: Office  PCP:  Hoyt Koch, MD  Cardiologist:  Dorris Carnes, MD  Electrophysiologist:  None   Evaluation Performed:  Follow-Up Visit  Chief Complaint:  F/U of CP    History of Present Illness:    Monique [name] is a 84 y.o. female with . female with hx of history of HOCM, HTN, PAF, bilateral PE, DVT, CKD stage III, hx of SAH due to cerebral aneurysm  and HLD  Her last ischemic evaluation was a NST 09/2017 which was a low risk study with normal perfusion and normal LV function. She had a LHC in 2009 which was without CAD, normal LV function, and no outflow tract gradient.   Admitted 08/2019 with chest pain. Felt atypical. Normal troponin. Had episode of SVT. Restarted her disopyramide/Norpace and increased her metoprolol to 50 mg twice a day. Outpatient stress test low risk with normal perfusion. Echo 09/16/19 showed LVEF of 65-70%, increased LVH, grade 2 DD, no obvious asymmetric hypertrophy or SAM compared to prior echo of 09/2017.  Seen by APP after   Still with  intermit chest discomfort   Last min to hours  SOme burping   Placed on protonix  Continues to have discomfort    Not every day   Comes and goes  WIll have some weakness and "tingling" in legs    The patient does not have symptoms concerning for COVID-19 infection (fever, chills, cough, or new shortness of breath).    Past Medical History:  Diagnosis Date  . Abdominal pain 11/18/2015  . Acute respiratory failure with hypoxia ([place]) 06/27/2014  . Breast pain, left 10/11/2016  . Cerebral aneurysm 2000 06/27/2014  . Chest pain 10/30/2014  . CHF (congestive heart failure) ([place])   . Chronic anticoagulation 06/2014   Coumadin started after bilateral PE  . Chronic kidney disease, stage III (moderate) 11/21/2014  . Clotting disorder ([place])    right was removed, still have left cataract  . Diabetes ([place])   . Dizziness and giddiness 11/28/2014  . DVT of lower extremity (deep venous thrombosis) ([place]) 11/28/2014  . Dysuria 07/07/2014  . Elevated troponin 11/28/2014  . Fatty liver   . Gastritis 05/16/2012   Chronic dyspepsia on PPI daily.   Marland Kitchen Headache, acute 06/08/2010   Qualifier: Diagnosis of  By: Linda Hedges MD, Heinz Knuckles   . Heart murmur   . Hematochezia 11/18/2015  . History of fibromuscular dysplasia 06/27/2014  . HTN (hypertension)    Essential  . Hyperlipidemia   . Hypertrophic obstructive cardiomyopathy (HOCM) ([place]) 05/20/2008   Qualifier: Diagnosis of  By: Linda Hedges MD, Heinz Knuckles   .  Kidney stones   . LVH (left ventricular hypertrophy)   . Orthostatic hypotension 11/28/2014  . PAF (paroxysmal atrial fibrillation) ([place]) 2000  . Palpitations 11/28/2014  . PAROXYSMAL ATRIAL FIBRILLATION 05/20/2008   Qualifier: Diagnosis of  By: Linda Hedges MD, Heinz Knuckles   . PE (pulmonary embolism) 06/2014   Bilateral  . Postmenopausal osteoporosis 04/2019   T score -3.3  . Pulmonary embolism, bilateral ([place]) 06/27/2014  . SOB (shortness of breath) 06/27/2014  . Subarachnoid hemorrhage ([place]) 2000  . UTI (urinary tract  infection) 01/04/2015   Past Surgical History:  Procedure Laterality Date  . ABDOMINAL HYSTERECTOMY  2000  . Brain hemorrage     2000  . CARDIAC CATHETERIZATION  05/2008   Nl cors, no gradient, EF 60%     Current Meds  Medication Sig  . acetaminophen (TYLENOL) 500 MG tablet Take 1,000 mg by mouth.  Marland Kitchen alum & mag hydroxide-simeth (MAALOX/MYLANTA) 200-200-20 MG/5ML suspension Take 15 mLs by mouth every 4 (four) hours as needed for indigestion or heartburn.  Marland Kitchen amLODipine (NORVASC) 5 MG tablet Take 5 mg by mouth 2 (two) times daily.  Marland Kitchen atorvastatin (LIPITOR) 20 MG tablet TAKE 1 TABLET (20 MG TOTAL) BY MOUTH DAILY AT 6 PM.  . disopyramide (NORPACE) 150 MG capsule TAKE 1 CAPSULE (150 MG TOTAL) BY MOUTH 2 (TWO) TIMES DAILY.  Marland Kitchen lidocaine (LIDODERM) 5 % Place 1 patch onto the skin daily.  . metFORMIN (GLUCOPHAGE) 500 MG tablet TAKE 1 TABLET (500 MG TOTAL) BY MOUTH DAILY WITH BREAKFAST.  . metoprolol tartrate (LOPRESSOR) 50 MG tablet Take 1 tablet (50 mg total) by mouth 2 (two) times daily.  . mirtazapine (REMERON) 15 MG tablet Take 15 mg by mouth at bedtime.  . nitroGLYCERIN (NITROSTAT) 0.4 MG SL tablet PLACE 1 TABLET (0.4 MG TOTAL) UNDER THE TONGUE EVERY 5 (FIVE) MINUTES AS NEEDED FOR CHEST PAIN.  Marland Kitchen pantoprazole (PROTONIX) 40 MG tablet Take 40 mg by mouth daily.  . sertraline (ZOLOFT) 50 MG tablet Take 50 mg by mouth daily.  Monique [name] 15 MG TABS tablet TAKE 1 TABLET BY MOUTH EVERY DAY WITH SUPPER     Allergies:   Patient has no known allergies.   Social History   Tobacco Use  . Smoking status: Former Smoker    Quit date: 09/06/1967    Years since quitting: 52.1  . Smokeless tobacco: Never Used  Substance Use Topics  . Alcohol use: No    Alcohol/week: 0.0 standard drinks  . Drug use: No     Family Hx: The patient's family history includes Arrhythmia in her mother and sister; Breast cancer (age of onset: 62) in her daughter; Breast cancer (age of onset: 33) in her mother; Heart disease  in her mother; Stroke in her mother. There is no history of Colon cancer, Esophageal cancer, Pancreatic cancer, Rectal cancer, or Stomach cancer.  ROS:   Please see the history of present illness.    All other systems reviewed and are negative.   Prior CV studies:   Echo 09/16/19  1. Left ventricular ejection fraction, by visual estimation, is 65 to 70%. The left ventricle has normal function. There is mildly increased left ventricular hypertrophy. 2. Elevated left atrial pressure. 3. Left ventricular diastolic parameters are consistent with Grade II diastolic dysfunction (pseudonormalization). 4. The left ventricle has no regional wall motion abnormalities. 5. Intracavitary gradient up to 5 mmHG. 6. Global right ventricle has normal systolic function.The right ventricular size is normal. No increase in right ventricular wall  thickness. 7. Left atrial size was mildly dilated. 8. The mitral valve is grossly normal. Trivial mitral valve regurgitation. 9. The tricuspid valve is grossly normal. 10. The aortic valve is tricuspid. Aortic valve regurgitation is not visualized. No evidence of aortic valve sclerosis or stenosis. 11. Mildly elevated pulmonary artery systolic pressure. 12. The tricuspid regurgitant velocity is 3.00 m/s, and with an assumed right atrial pressure of 10 mmHg, the estimated right ventricular systolic pressure is mildly elevated at 45.9 mmHg. In comparison to the previous echocardiogram(s): EF remains unchanged on this study. Intracavitary gradient ~5 mmHG on the present study, and noted to be up to 34 mmHG on the prior study. No obvious asymmetric hypertrophy or SAM on this study. Compared to 09/08/2017.  08/3019  Myovue   The left ventricular ejection fraction is normal (55-65%).  Nuclear stress EF: 65%.  No T wave inversion was noted during stress.  There was no ST segment deviation noted during stress.  This is a low risk study.     Labs/Other Tests  and Data Reviewed:    EKG:  No ECG reviewed.  Recent Labs: 04/29/2019: TSH 2.660 08/16/2019: ALT 6; Pro B Natriuretic peptide (BNP) 227.0 08/22/2019: BUN 16; Creatinine, Ser 1.19; Hemoglobin 12.9; Magnesium 1.8; Platelets 162; Potassium 3.7; Sodium 141   Recent Lipid Panel Lab Results  Component Value Date/Time   CHOL 117 07/16/2019 09:07 AM   CHOL 140 11/11/2016 09:00 AM   TRIG 65.0 07/16/2019 09:07 AM   HDL 49.80 07/16/2019 09:07 AM   HDL 51 11/11/2016 09:00 AM   CHOLHDL 2 07/16/2019 09:07 AM   LDLCALC 54 07/16/2019 09:07 AM   LDLCALC 65 11/11/2016 09:00 AM    Wt Readings from Last 3 Encounters:  11/01/19 141 lb (64 kg)  09/19/19 144 lb (65.3 kg)  09/04/19 145 lb (65.8 kg)     Objective:    Vital Signs:  BP 136/81   Pulse (!) 57   Ht 5\' 6"  (1.676 m)   Wt 141 lb (64 kg)   BMI 22.76 kg/m    VITAL SIGNS:  reviewed  ASSESSMENT & PLAN:    1. [place] discomfort  WOrk up has been negative   I would continue to follow   I have encouraged her to stay active   Will plan for f/u later this spring    2  Hx of HOCM   Recent echo shows really no significant change   Mild increase in gradient in LV   I would keep on current regimen  3  Hx PAF   Keep on anticoagulation    4  HTN   BP is OK   Keep on same meds   Stay hydrated    COVID-19 Education: The signs and symptoms of COVID-19 were discussed with the patient and how to seek care for testing (follow up with PCP or arrange E-visit).  The importance of social distancing was discussed today.  Time:   Today, I have spent 20  minutes with the patient with telehealth technology discussing the above problems.     Medication Adjustments/Labs and Tests Ordered: Current medicines are reviewed at length with the patient today.  Concerns regarding medicines are outlined above.   Tests Ordered: No orders of the defined types were placed in this encounter.   Medication Changes: No orders of the defined types were placed in  this encounter.   Follow Up:  Virtual Visit  F/U in later spring   Signed, Dorris Carnes, MD  11/01/2019 11:43 AM    [facility]

## 2019-11-10 ENCOUNTER — Ambulatory Visit: Payer: Medicare Other | Attending: Internal Medicine

## 2019-11-10 DIAGNOSIS — Z23 Encounter for immunization: Secondary | ICD-10-CM | POA: Insufficient documentation

## 2019-11-10 NOTE — Progress Notes (Signed)
   Covid-19 Vaccination Clinic  Name:  Monique [name]    MRN: [id number] DOB: 06-22-1936  11/10/2019  Monique [name] was observed post Covid-19 immunization for 15 minutes without incident. She was provided with Vaccine Information Sheet and instruction to access the V-Safe system.   Monique [name] was instructed to call 911 with any severe reactions post vaccine: Marland Kitchen Difficulty breathing  . Swelling of face and throat  . A fast heartbeat  . A bad rash all over body  . Dizziness and weakness   Immunizations Administered    Name Date Dose VIS Date Route   Pfizer COVID-19 Vaccine 11/10/2019  1:55 PM 0.3 mL 08/16/2019 Intramuscular   Manufacturer: [place]   Lot: [id number]   [place]: [id number]

## 2019-11-18 ENCOUNTER — Encounter: Payer: Self-pay | Admitting: Internal Medicine

## 2019-11-18 ENCOUNTER — Ambulatory Visit (INDEPENDENT_AMBULATORY_CARE_PROVIDER_SITE_OTHER): Payer: Medicare Other | Admitting: Internal Medicine

## 2019-11-18 ENCOUNTER — Other Ambulatory Visit: Payer: Self-pay

## 2019-11-18 VITALS — BP 146/82 | HR 55 | Temp 97.8°F | Ht 66.0 in | Wt 139.4 lb

## 2019-11-18 DIAGNOSIS — R0789 Other chest pain: Secondary | ICD-10-CM | POA: Diagnosis not present

## 2019-11-18 DIAGNOSIS — E119 Type 2 diabetes mellitus without complications: Secondary | ICD-10-CM

## 2019-11-18 DIAGNOSIS — I10 Essential (primary) hypertension: Secondary | ICD-10-CM

## 2019-11-18 LAB — COMPREHENSIVE METABOLIC PANEL
ALT: 6 U/L (ref 0–35)
AST: 15 U/L (ref 0–37)
Albumin: 3.8 g/dL (ref 3.5–5.2)
Alkaline Phosphatase: 85 U/L (ref 39–117)
BUN: 21 mg/dL (ref 6–23)
CO2: 29 mEq/L (ref 19–32)
Calcium: 9.7 mg/dL (ref 8.4–10.5)
Chloride: 104 mEq/L (ref 96–112)
Creatinine, Ser: 1.25 mg/dL — ABNORMAL HIGH (ref 0.40–1.20)
GFR: 49.69 mL/min — ABNORMAL LOW (ref >60.00)
Glucose, Bld: 203 mg/dL — ABNORMAL HIGH (ref 70–99)
Potassium: 3.5 mEq/L (ref 3.5–5.1)
Sodium: 141 mEq/L (ref 135–145)
Total Bilirubin: 0.5 mg/dL (ref 0.2–1.2)
Total Protein: 7 g/dL (ref 6.0–8.3)

## 2019-11-18 LAB — HEMOGLOBIN A1C: Hgb A1c MFr Bld: 7.1 % — ABNORMAL HIGH (ref 4.6–6.5)

## 2019-11-18 LAB — CBC
HCT: 40.8 % (ref 36.0–46.0)
Hemoglobin: 13.5 g/dL (ref 12.0–15.0)
MCHC: 33.1 g/dL (ref 30.0–36.0)
MCV: 95.2 fl (ref 78.0–100.0)
Platelets: 183 10*3/uL (ref 150.0–400.0)
RBC: 4.28 Mil/uL (ref 3.87–5.11)
RDW: 14 % (ref 11.5–15.5)
WBC: 7.3 10*3/uL (ref 4.0–10.5)

## 2019-11-18 LAB — TROPONIN I (HIGH SENSITIVITY): High Sens Troponin I: 14 ng/L (ref 2–17)

## 2019-11-18 MED ORDER — LANSOPRAZOLE 30 MG PO CPDR: 30.0000 mg | DELAYED_RELEASE_CAPSULE | Freq: Every day | ORAL | 3 refills | Status: DC

## 2019-11-18 NOTE — Assessment & Plan Note (Signed)
Could be related to GERD. Most recent mammogram July 2020 and normal. Review of old imaging noted today incidental finding 2016 CT chest of aorta concern for aneurysm and need for follow up which was not done. Will order CT chest with contrast once BMP back today. Recent abdomen/pelvis CT without acute findings. Will switch pantoprazole to prevacid to see if we can get better control and if not needs referral to GI for EGD to assess.

## 2019-11-18 NOTE — Progress Notes (Signed)
   Subjective:   Patient ID: Monique [name], female    DOB: 06/14/1936, 84 y.o.   MRN: [id number]  HPI The patient is an 84 YO female coming in for follow up diabetes (taking metformin, denies low sugars, denies numbness or tingling in feet) and blood pressure (BP running good at home, denies headaches, having chest pressure she separate, taking her medications as prescribed) and recent chest tightness (since our last visit she has seen cardiology several times, negative stress test, they do not feel cardiac, she is still having this and had a bad episode last night, yesterday she was having some problems with her stomach after eating, taking protonix daily, used lidocaine patch and this did not help yesterday).   Review of Systems  Constitutional: Negative.   HENT: Negative.   Eyes: Negative.   Respiratory: Positive for chest tightness. Negative for cough and shortness of breath.   Cardiovascular: Positive for chest pain. Negative for palpitations and leg swelling.  Gastrointestinal: Negative for abdominal distention, abdominal pain, constipation, diarrhea, nausea and vomiting.       Gerd  Musculoskeletal: Negative.   Skin: Negative.   Neurological: Negative.   Psychiatric/Behavioral: Negative.     Objective:  Physical Exam Constitutional:      Appearance: She is well-developed.  HENT:     Head: Normocephalic and atraumatic.  Cardiovascular:     Rate and Rhythm: Normal rate and regular rhythm.     Comments: No pain to palpation on exam midline chest Pulmonary:     Effort: Pulmonary effort is normal. No respiratory distress.     Breath sounds: Normal breath sounds. No wheezing or rales.  Abdominal:     General: Bowel sounds are normal. There is no distension.     Palpations: Abdomen is soft.     Tenderness: There is no abdominal tenderness. There is no rebound.  Musculoskeletal:     Cervical back: Normal range of motion.  Skin:    General: Skin is warm and dry.    Neurological:     Mental Status: She is alert and oriented to person, place, and time.     Coordination: Coordination normal.     Vitals:   11/18/19 1014 11/18/19 1107  BP: (!) 156/94 (!) 146/82  Pulse: (!) 55   Temp: 97.8 F (36.6 C)   TempSrc: Oral   SpO2: 96%   Weight: 139 lb 6 oz (63.2 kg)   Height: 5\' 6"  (1.676 m)     This visit occurred during the SARS-CoV-2 public health emergency.  Safety protocols were in place, including screening questions prior to the visit, additional usage of staff PPE, and extensive cleaning of exam room while observing appropriate contact time as indicated for disinfecting solutions.   Assessment & Plan:

## 2019-11-18 NOTE — Patient Instructions (Signed)
We will change the protonix to prevacid to see if this takes away the chest tightness you are having.  We are checking the labs today.

## 2019-11-18 NOTE — Assessment & Plan Note (Signed)
Checking HgA1c and adjust metformin as needed. On statin.

## 2019-11-18 NOTE — Assessment & Plan Note (Signed)
High initially but improved some on recheck. Home readings and previous recent readings at other offices are at goal so no adjustments were made today to her metoprolol, amlodipine, disopyramide.

## 2019-11-19 ENCOUNTER — Other Ambulatory Visit: Payer: Self-pay | Admitting: Internal Medicine

## 2019-11-19 DIAGNOSIS — I719 Aortic aneurysm of unspecified site, without rupture: Secondary | ICD-10-CM

## 2019-12-05 ENCOUNTER — Ambulatory Visit
Admission: RE | Admit: 2019-12-05 | Discharge: 2019-12-05 | Disposition: A | Discharge status: Home / Self Care | Payer: Medicare Other | Source: Ambulatory Visit | Attending: Internal Medicine | Admitting: Internal Medicine

## 2019-12-05 DIAGNOSIS — I712 Thoracic aortic aneurysm, without rupture: Secondary | ICD-10-CM | POA: Diagnosis not present

## 2019-12-05 DIAGNOSIS — I719 Aortic aneurysm of unspecified site, without rupture: Secondary | ICD-10-CM

## 2019-12-05 MED ORDER — IOPAMIDOL (ISOVUE-300) INJECTION 61%
75.0000 mL | Freq: Once | INTRAVENOUS | Status: AC | PRN
  Administered 2019-12-05: 75 mL via INTRAVENOUS

## 2019-12-09 ENCOUNTER — Ambulatory Visit: Payer: Medicare Other | Attending: Internal Medicine

## 2019-12-09 ENCOUNTER — Telehealth: Payer: Medicare Other | Admitting: Internal Medicine

## 2019-12-09 DIAGNOSIS — Z23 Encounter for immunization: Secondary | ICD-10-CM

## 2019-12-09 NOTE — Progress Notes (Signed)
   Covid-19 Vaccination Clinic  Name:  Monique [name]    MRN: [id number] DOB: 1936/07/25  12/09/2019  [name] was observed post Covid-19 immunization for 15 minutes without incident. She was provided with Vaccine Information Sheet and instruction to access the V-Safe system.   [name] was instructed to call 911 with any severe reactions post vaccine: Marland Kitchen Difficulty breathing  . Swelling of face and throat  . A fast heartbeat  . A bad rash all over body  . Dizziness and weakness   Immunizations Administered    Name Date Dose VIS Date Route   Pfizer COVID-19 Vaccine 12/09/2019 11:10 AM 0.3 mL 08/16/2019 Intramuscular   Manufacturer: [employer]   Lot: [id number]   [place]: [id number]

## 2019-12-11 ENCOUNTER — Telehealth: Payer: Self-pay | Admitting: Physician Assistant

## 2019-12-11 ENCOUNTER — Other Ambulatory Visit: Payer: Self-pay

## 2019-12-11 ENCOUNTER — Encounter: Payer: Self-pay | Admitting: Physician Assistant

## 2019-12-11 ENCOUNTER — Telehealth (INDEPENDENT_AMBULATORY_CARE_PROVIDER_SITE_OTHER): Payer: Medicare Other | Admitting: Physician Assistant

## 2019-12-11 VITALS — BP 113/76 | HR 62 | Ht 66.0 in | Wt 139.0 lb

## 2019-12-11 DIAGNOSIS — I10 Essential (primary) hypertension: Secondary | ICD-10-CM

## 2019-12-11 DIAGNOSIS — I471 Supraventricular tachycardia, unspecified: Secondary | ICD-10-CM

## 2019-12-11 DIAGNOSIS — I712 Thoracic aortic aneurysm, without rupture, unspecified: Secondary | ICD-10-CM

## 2019-12-11 DIAGNOSIS — G4489 Other headache syndrome: Secondary | ICD-10-CM

## 2019-12-11 DIAGNOSIS — I480 Paroxysmal atrial fibrillation: Secondary | ICD-10-CM | POA: Diagnosis not present

## 2019-12-11 DIAGNOSIS — I421 Obstructive hypertrophic cardiomyopathy: Secondary | ICD-10-CM | POA: Diagnosis not present

## 2019-12-11 DIAGNOSIS — R0789 Other chest pain: Secondary | ICD-10-CM | POA: Diagnosis not present

## 2019-12-11 NOTE — Telephone Encounter (Signed)
Patient had a chest CT with contrast on 12/05/19.

## 2019-12-11 NOTE — Patient Instructions (Addendum)
Medication Instructions:   Your physician recommends that you continue on your current medications as directed. Please refer to the Current Medication list given to you today.  *If you need a refill on your cardiac medications before your next appointment, please call your pharmacy*  Lab Work:  None ordered today  Testing/Procedures:  Non-Cardiac CT Angiography (CTA), is a special type of CT scan that uses a computer to produce multi-dimensional views of major blood vessels throughout the body. In CT angiography, a contrast material is injected through an IV to help visualize the blood vessels  Follow-Up: At [facility], you and your health needs are our priority.  As part of our continuing mission to provide you with exceptional heart care, we have created designated Provider Care Teams.  These Care Teams include your primary Cardiologist (physician) and Advanced Practice Providers (APPs -  Physician Assistants and Nurse Practitioners) who all work together to provide you with the care you need, when you need it.  We recommend signing up for the patient portal called "MyChart".  Sign up information is provided on this After Visit Summary.  MyChart is used to connect with patients for Virtual Visits (Telemedicine).  Patients are able to view lab/test results, encounter notes, upcoming appointments, etc.  Non-urgent messages can be sent to your provider as well.   To learn more about what you can do with MyChart, go to [email or web address].    Your next appointment:   6 month(s)  The format for your next appointment:   In Person  Provider:   You may see Dorris Carnes, MD or Richardson Dopp, PA-C  Other Instructions  Call your primary care physician about headache

## 2019-12-11 NOTE — Progress Notes (Signed)
Virtual Visit via Telephone Note   This visit type was conducted due to national recommendations for restrictions regarding the COVID-19 Pandemic (e.g. social distancing) in an effort to limit this patient's exposure and mitigate transmission in our community.  Due to her co-morbid illnesses, this patient is at least at moderate risk for complications without adequate follow up.  This format is felt to be most appropriate for this patient at this time.  The patient did not have access to video technology/had technical difficulties with video requiring transitioning to audio format only (telephone).  All issues noted in this document were discussed and addressed.  No physical exam could be performed with this format.  Please refer to the patient's chart for her  consent to telehealth for [facility].   The patient was identified using 2 identifiers.  Date:  12/11/2019   ID:  Monique [name], DOB [birth year], MRN [id number]  Patient Location: Home Provider Location: Home  PCP:  Hoyt Koch, MD  Cardiologist:  Dorris Carnes, MD   Electrophysiologist:  None   Evaluation Performed:  Follow-Up Visit  Chief Complaint:  FU on HOCM and   Patient Profile: Monique [name] is a 84 y.o. female with    Hypertrophic obstructive cardiomyopathy  Echocardiogram 1/19: Gradient 34 mmHg  Echocardiogram 09/2019: no asymmetric hypertrophy or SAM, gradient 5 mmHg  Hypertension  Paroxysmal atrial fibrillation CHA2DS2-VASc=4 (HTN, age x 2, female) >> Rivaroxaban CrCl (12/2019): 35 >> Rivaroxaban 15 mg   History of SVT  Thoracic aortic aneurysm  CT 12/2019: 4 cm  History of DVT, bilateral pulmonary embolism  Chronic kidney disease stage III  Cerebral aneurysm, history of subarachnoid hemorrhage  Hyperlipidemia  Cardiac catheterization 2009: No CAD  Myoview 1/19: Normal perfusion   Myoview 08/2019: Low risk  Prior CV Studies: Chest CTA 12/05/2019 Ascending thoracic aortic  aneurysm 4 cm, mild emphysema, aortic atherosclerosis  Echocardiogram 09/16/2019 EF 65-70, mild LVH, GRII DD, no RWMA, intracavitary gradient 5 mmHg, normal RV SF, mild LAE, trivial MR, RVSP 45.9 mmHg, no obvious asymmetric hypertrophy or SAM (prior intracavitary gradient 34 mmHg; 5 mmHg on this study)  Myoview 09/04/2019 EF 65, normal perfusion; Low Risk  Myoview 09/08/2017 EF 64, normal perfusion; low risk  Echocardiogram 09/09/2017 Moderate LVH, EF 65-70, intracavitary gradient 34 mmHg, asymmetric LVH, mild SAM, GR 1 DD, mean aortic valve gradient 21 mmHg  Cardiac catheterization 05/11/2008 No CAD EF 60; no gradient on pullback from mid LV to the LVOT to the aorta-no evidence for LVOT gradient  History of Present Illness:   Ms. Monique [name] was admitted in December 2020 with chest pain.  High-sensitivity troponins were normal.  She did have an episode of SVT.  Disopyramide was resumed and metoprolol was increased.  Outpatient stress test demonstrated normal perfusion.  An echocardiogram demonstrated normal LV function and no obvious hypertrophy or SAM.  Patient was last seen by Dr. Harrington Challenger via telemedicine in February 2021.  She still had occasional chest pain that was not felt to be cardiac. No further changes were made  Today, she notes no further significant chest pain.  She has not had shortness of breath, syncope, leg swelling.  She sometimes gets short of breath laying flat but only sleeps on 1 pillow.  She has some hemorrhoidal bleeding but no other melena or hematochezia.  She had her 2nd COVID-19 vaccine and has had a R sided HA since.  She also notes a L sided HA that started before her 2nd vaccine.  She has not had any fever, arthralgias, chills.    Past Medical History:  Diagnosis Date  . Abdominal pain 11/18/2015  . Acute respiratory failure with hypoxia ([place]) 06/27/2014  . Breast pain, left 10/11/2016  . Cerebral aneurysm 2000 06/27/2014  . Chest pain 10/30/2014  . CHF (congestive heart  failure) ([place])   . Chronic anticoagulation 06/2014   Coumadin started after bilateral PE  . Chronic kidney disease, stage III (moderate) 11/21/2014  . Clotting disorder ([place])    right was removed, still have left cataract  . Diabetes ([place])   . Dizziness and giddiness 11/28/2014  . DVT of lower extremity (deep venous thrombosis) ([place]) 11/28/2014  . Dysuria 07/07/2014  . Elevated troponin 11/28/2014  . Fatty liver   . Gastritis 05/16/2012   Chronic dyspepsia on PPI daily.   Marland Kitchen Headache, acute 06/08/2010   Qualifier: Diagnosis of  By: Linda Hedges MD, Heinz Knuckles   . Heart murmur   . Hematochezia 11/18/2015  . History of fibromuscular dysplasia 06/27/2014  . HTN (hypertension)    Essential  . Hyperlipidemia   . Hypertrophic obstructive cardiomyopathy (HOCM) ([place]) 05/20/2008   Qualifier: Diagnosis of  By: Linda Hedges MD, Heinz Knuckles   . Kidney stones   . LVH (left ventricular hypertrophy)   . Orthostatic hypotension 11/28/2014  . PAF (paroxysmal atrial fibrillation) ([place]) 2000  . Palpitations 11/28/2014  . PAROXYSMAL ATRIAL FIBRILLATION 05/20/2008   Qualifier: Diagnosis of  By: Linda Hedges MD, Heinz Knuckles   . PE (pulmonary embolism) 06/2014   Bilateral  . Postmenopausal osteoporosis 04/2019   T score -3.3  . Pulmonary embolism, bilateral ([place]) 06/27/2014  . SOB (shortness of breath) 06/27/2014  . Subarachnoid hemorrhage ([place]) 2000  . UTI (urinary tract infection) 01/04/2015   Past Surgical History:  Procedure Laterality Date  . ABDOMINAL HYSTERECTOMY  2000  . Brain hemorrage     2000  . CARDIAC CATHETERIZATION  05/2008   Nl cors, no gradient, EF 60%     Current Meds  Medication Sig  . acetaminophen (TYLENOL) 500 MG tablet Take 1,000 mg by mouth.  Marland Kitchen alum & mag hydroxide-simeth (MAALOX/MYLANTA) 200-200-20 MG/5ML suspension Take 15 mLs by mouth every 4 (four) hours as needed for indigestion or heartburn.  Marland Kitchen amLODipine (NORVASC) 5 MG tablet Take 5 mg by mouth 2 (two) times daily.  Marland Kitchen atorvastatin (LIPITOR)  20 MG tablet TAKE 1 TABLET (20 MG TOTAL) BY MOUTH DAILY AT 6 PM.  . disopyramide (NORPACE) 150 MG capsule TAKE 1 CAPSULE (150 MG TOTAL) BY MOUTH 2 (TWO) TIMES DAILY.  Marland Kitchen lansoprazole (PREVACID) 30 MG capsule Take 1 capsule (30 mg total) by mouth daily at 12 noon.  . lidocaine (LIDODERM) 5 % Place 1 patch onto the skin daily.  . metFORMIN (GLUCOPHAGE) 500 MG tablet TAKE 1 TABLET (500 MG TOTAL) BY MOUTH DAILY WITH BREAKFAST.  . metoprolol tartrate (LOPRESSOR) 50 MG tablet Take 1 tablet (50 mg total) by mouth 2 (two) times daily.  . mirtazapine (REMERON) 15 MG tablet Take 15 mg by mouth at bedtime.  . nitroGLYCERIN (NITROSTAT) 0.4 MG SL tablet PLACE 1 TABLET (0.4 MG TOTAL) UNDER THE TONGUE EVERY 5 (FIVE) MINUTES AS NEEDED FOR CHEST PAIN.  Marland Kitchen XARELTO 15 MG TABS tablet TAKE 1 TABLET BY MOUTH EVERY DAY WITH SUPPER     Allergies:   Patient has no known allergies.   Social History   Tobacco Use  . Smoking status: Former Smoker    Quit date: 09/06/1967    Years  since quitting: 52.2  . Smokeless tobacco: Never Used  Substance Use Topics  . Alcohol use: No    Alcohol/week: 0.0 standard drinks  . Drug use: No     Family Hx: The patient's family history includes Arrhythmia in her mother and sister; Breast cancer (age of onset: 73) in her daughter; Breast cancer (age of onset: 74) in her mother; Heart disease in her mother; Stroke in her mother. There is no history of Colon cancer, Esophageal cancer, Pancreatic cancer, Rectal cancer, or Stomach cancer.  ROS:   Please see the history of present illness.      Labs/Other Tests and Data Reviewed:    EKG:  No ECG reviewed.  Recent Labs: 04/29/2019: TSH 2.660 08/16/2019: Pro B Natriuretic peptide (BNP) 227.0 08/22/2019: Magnesium 1.8 11/18/2019: ALT 6; BUN 21; Creatinine, Ser 1.25; Hemoglobin 13.5; Platelets 183.0; Potassium 3.5; Sodium 141   Recent Lipid Panel Lab Results  Component Value Date/Time   CHOL 117 07/16/2019 09:07 AM   CHOL 140  11/11/2016 09:00 AM   TRIG 65.0 07/16/2019 09:07 AM   HDL 49.80 07/16/2019 09:07 AM   HDL 51 11/11/2016 09:00 AM   CHOLHDL 2 07/16/2019 09:07 AM   LDLCALC 54 07/16/2019 09:07 AM   LDLCALC 65 11/11/2016 09:00 AM    Wt Readings from Last 3 Encounters:  12/11/19 139 lb (63 kg)  11/18/19 139 lb 6 oz (63.2 kg)  11/01/19 141 lb (64 kg)     Objective:    Vital Signs:  BP 113/76   Pulse 62   Ht 5\' 6"  (1.676 m)   Wt 139 lb (63 kg)   BMI 22.44 kg/m    VITAL SIGNS:  reviewed GEN:  no acute distress RESPIRATORY:  no labored breathing NEURO:  alert and oriented x 3, no obvious focal deficit PSYCH:  normal affect  ASSESSMENT & PLAN:    1. Other chest pain Cardiac catheterization in 2009 demonstrated no coronary artery disease and a Myoview in 08/2019 was low risk.  She has not really had significant chest pain since last seen.  No further work up needed.   2. Paroxysmal atrial fibrillation (HCC) She is tolerating anticoagulation.  Creatinine clearance 35.  Continue Rivaroxaban 15 mg once daily. Recent Hgb and Creatinine stable.   3. PSVT (paroxysmal supraventricular tachycardia) ([place]) She has not had symptoms of rapid palpitations.  Continue disopyramide.    4. Hypertrophic obstructive cardiomyopathy (HOCM) (HCC) No significant gradient or SAM on last echocardiogram.    5. Essential hypertension The patient's blood pressure is controlled on her current regimen.  Continue current therapy.    6. Thoracic aortic aneurysm without rupture (HCC) 4 cm by recent CT.  I reviewed this with the patient.  She will need a repeat CTA in 1 year.    7. Headache I asked her to contact her PCP for evaluation.   Time:   Today, I have spent 13 minutes with the patient with telehealth technology discussing the above problems.     Medication Adjustments/Labs and Tests Ordered: Current medicines are reviewed at length with the patient today.  Concerns regarding medicines are outlined above.    Tests Ordered: Orders Placed This Encounter  Procedures  . CT ANGIO CHEST AORTA W/CM & OR WO/CM    Medication Changes: No orders of the defined types were placed in this encounter.   Follow Up:  In Person in 6 month(s)  Signed, Richardson Dopp, PA-C  12/11/2019 5:21 PM    [facility] 

## 2019-12-11 NOTE — Telephone Encounter (Signed)
Yes.  She needs a follow up chest CTA in 1 year to follow up on her thoracic aortic aneurysm. It does not need to be done until 12/2020. Richardson Dopp, PA-C    12/11/2019 4:47 PM

## 2019-12-11 NOTE — Telephone Encounter (Signed)
° °  Pt called, she said she received a call to schedule a CT. She said she wasn't sure what it is for since she just had a CT last 12/05/19 ordered by her pcp. She is wondering if it's just the same test.  Please advise

## 2019-12-12 ENCOUNTER — Other Ambulatory Visit: Payer: Self-pay

## 2019-12-12 ENCOUNTER — Encounter: Payer: Self-pay | Admitting: Internal Medicine

## 2019-12-12 ENCOUNTER — Ambulatory Visit (INDEPENDENT_AMBULATORY_CARE_PROVIDER_SITE_OTHER): Payer: Medicare Other | Admitting: Internal Medicine

## 2019-12-12 ENCOUNTER — Ambulatory Visit (INDEPENDENT_AMBULATORY_CARE_PROVIDER_SITE_OTHER)
Admission: RE | Admit: 2019-12-12 | Discharge: 2019-12-12 | Disposition: A | Discharge status: Home / Self Care | Payer: Medicare Other | Source: Ambulatory Visit | Attending: Internal Medicine | Admitting: Internal Medicine

## 2019-12-12 VITALS — BP 140/80 | HR 55 | Temp 97.9°F | Resp 16 | Ht 66.0 in | Wt 140.0 lb

## 2019-12-12 DIAGNOSIS — G4451 Hemicrania continua: Secondary | ICD-10-CM | POA: Insufficient documentation

## 2019-12-12 DIAGNOSIS — R519 Headache, unspecified: Secondary | ICD-10-CM | POA: Diagnosis not present

## 2019-12-12 LAB — CBC WITH DIFFERENTIAL/PLATELET
Basophils Absolute: 0 10*3/uL (ref 0.0–0.1)
Basophils Relative: 0.4 % (ref 0.0–3.0)
Eosinophils Absolute: 0.1 10*3/uL (ref 0.0–0.7)
Eosinophils Relative: 1.1 % (ref 0.0–5.0)
HCT: 40 % (ref 36.0–46.0)
Hemoglobin: 13.4 g/dL (ref 12.0–15.0)
Lymphocytes Relative: 46.8 % — ABNORMAL HIGH (ref 12.0–46.0)
Lymphs Abs: 3 10*3/uL (ref 0.7–4.0)
MCHC: 33.5 g/dL (ref 30.0–36.0)
MCV: 95.5 fl (ref 78.0–100.0)
Monocytes Absolute: 0.4 10*3/uL (ref 0.1–1.0)
Monocytes Relative: 6.9 % (ref 3.0–12.0)
Neutro Abs: 2.9 10*3/uL (ref 1.4–7.7)
Neutrophils Relative %: 44.8 % (ref 43.0–77.0)
Platelets: 168 10*3/uL (ref 150.0–400.0)
RBC: 4.19 Mil/uL (ref 3.87–5.11)
RDW: 13.9 % (ref 11.5–15.5)
WBC: 6.5 10*3/uL (ref 4.0–10.5)

## 2019-12-12 LAB — SEDIMENTATION RATE: Sed Rate: 17 mm/hr (ref 0–30)

## 2019-12-12 NOTE — Telephone Encounter (Signed)
I put the orders in for CTA, patient does not need this until April 2022.

## 2019-12-12 NOTE — Patient Instructions (Signed)
General Headache Without Cause A headache is pain or discomfort that is felt around the head or neck area. There are many causes and types of headaches. In some cases, the cause may not be found. Follow these instructions at home: Watch your condition for any changes. Let your doctor know about them. Take these steps to help with your condition: Managing pain      Take over-the-counter and prescription medicines only as told by your doctor.  Lie down in a dark, quiet room when you have a headache.  If told, put ice on your head and neck area: ? Put ice in a plastic bag. ? Place a towel between your skin and the bag. ? Leave the ice on for 20 minutes, 2-3 times per day.  If told, put heat on the affected area. Use the heat source that your doctor recommends, such as a moist heat pack or a heating pad. ? Place a towel between your skin and the heat source. ? Leave the heat on for 20-30 minutes. ? Remove the heat if your skin turns bright red. This is very important if you are unable to feel pain, heat, or cold. You may have a greater risk of getting burned.  Keep lights dim if bright lights bother you or make your headaches worse. Eating and drinking  Eat meals on a regular schedule.  If you drink alcohol: ? Limit how much you use to:  0-1 drink a day for women.  0-2 drinks a day for men. ? Be aware of how much alcohol is in your drink. In the [place] one drink equals one 12 oz bottle of beer (355 mL), one 5 oz glass of wine (148 mL), or one 1 oz glass of hard liquor (44 mL).  Stop drinking caffeine, or reduce how much caffeine you drink. General instructions   Keep a journal to find out if certain things bring on headaches. For example, write down: ? What you eat and drink. ? How much sleep you get. ? Any change to your diet or medicines.  Get a massage or try other ways to relax.  Limit stress.  Sit up straight. Do not tighten (tense) your muscles.  Do not use any  products that contain nicotine or tobacco. This includes cigarettes, e-cigarettes, and chewing tobacco. If you need help quitting, ask your doctor.  Exercise regularly as told by your doctor.  Get enough sleep. This often means 7-9 hours of sleep each night.  Keep all follow-up visits as told by your doctor. This is important. Contact a doctor if:  Your symptoms are not helped by medicine.  You have a headache that feels different than the other headaches.  You feel sick to your stomach (nauseous) or you throw up (vomit).  You have a fever. Get help right away if:  Your headache gets very bad quickly.  Your headache gets worse after a lot of physical activity.  You keep throwing up.  You have a stiff neck.  You have trouble seeing.  You have trouble speaking.  You have pain in the eye or ear.  Your muscles are weak or you lose muscle control.  You lose your balance or have trouble walking.  You feel like you will pass out (faint) or you pass out.  You are mixed up (confused).  You have a seizure. Summary  A headache is pain or discomfort that is felt around the head or neck area.  There are many causes and   types of headaches. In some cases, the cause may not be found.  Keep a journal to help find out what causes your headaches. Watch your condition for any changes. Let your doctor know about them.  Contact a doctor if you have a headache that is different from usual, or if your headache is not helped by medicine.  Get help right away if your headache gets very bad, you throw up, you have trouble seeing, you lose your balance, or you have a seizure. This information is not intended to replace advice given to you by your health care provider. Make sure you discuss any questions you have with your health care provider. Document Revised: 03/12/2018 Document Reviewed: 03/12/2018 [employer]vier Patient Education  2020 [employer]  

## 2019-12-12 NOTE — Progress Notes (Signed)
Subjective:  Patient ID: Monique [name], female    DOB: 06-06-1936  Age: 84 y.o. MRN: [id number]  CC: Headache  This visit occurred during the SARS-CoV-2 public health emergency.  Safety protocols were in place, including screening questions prior to the visit, additional usage of staff PPE, and extensive cleaning of exam room while observing appropriate contact time as indicated for disinfecting solutions.   NEW TO ME  HPI Monique [name] presents for concerns about a headache.  She complains of a 2-week history of pain in her left parietal region.  She describes it as a throbbing and intermittent discomfort with no changes in her vision or hearing.  She denies nausea, vomiting, neck pain, slurred speech, ataxia, or new paresthesias.  She is getting some symptom relief with Tylenol.  She says the pain reminds her of 20 years ago when she describes having a brain bleed.  Outpatient Medications Prior to Visit  Medication Sig Dispense Refill  . acetaminophen (TYLENOL) 500 MG tablet Take 1,000 mg by mouth.    Marland Kitchen alum & mag hydroxide-simeth (MAALOX/MYLANTA) 200-200-20 MG/5ML suspension Take 15 mLs by mouth every 4 (four) hours as needed for indigestion or heartburn. 355 mL 0  . amLODipine (NORVASC) 5 MG tablet Take 5 mg by mouth 2 (two) times daily.    Marland Kitchen atorvastatin (LIPITOR) 20 MG tablet TAKE 1 TABLET (20 MG TOTAL) BY MOUTH DAILY AT 6 PM. 90 tablet 1  . disopyramide (NORPACE) 150 MG capsule TAKE 1 CAPSULE (150 MG TOTAL) BY MOUTH 2 (TWO) TIMES DAILY. 180 capsule 3  . lansoprazole (PREVACID) 30 MG capsule Take 1 capsule (30 mg total) by mouth daily at 12 noon. 90 capsule 3  . lidocaine (LIDODERM) 5 % Place 1 patch onto the skin daily. 30 patch 3  . metFORMIN (GLUCOPHAGE) 500 MG tablet TAKE 1 TABLET (500 MG TOTAL) BY MOUTH DAILY WITH BREAKFAST. 90 tablet 1  . metoprolol tartrate (LOPRESSOR) 50 MG tablet Take 1 tablet (50 mg total) by mouth 2 (two) times daily. 180 tablet 3  . mirtazapine  (REMERON) 15 MG tablet Take 15 mg by mouth at bedtime.    . nitroGLYCERIN (NITROSTAT) 0.4 MG SL tablet PLACE 1 TABLET (0.4 MG TOTAL) UNDER THE TONGUE EVERY 5 (FIVE) MINUTES AS NEEDED FOR CHEST PAIN. 75 tablet 1  . XARELTO 15 MG TABS tablet TAKE 1 TABLET BY MOUTH EVERY DAY WITH SUPPER 90 tablet 2   No facility-administered medications prior to visit.    ROS Review of Systems  Constitutional: Negative.  Negative for chills, diaphoresis, fatigue and fever.  HENT: Negative.  Negative for trouble swallowing.   Eyes: Negative.  Negative for visual disturbance.  Respiratory: Negative for cough, chest tightness, shortness of breath and wheezing.   Cardiovascular: Negative for chest pain, palpitations and leg swelling.  Gastrointestinal: Negative for abdominal pain, diarrhea, nausea and vomiting.  Endocrine: Negative.   Genitourinary: Negative.  Negative for difficulty urinating.  Musculoskeletal: Negative.  Negative for back pain and neck pain.  Skin: Negative.   Neurological: Positive for headaches. Negative for dizziness, weakness and light-headedness.       She has chronic, unchanged tingling in both upper extremities.  Hematological: Negative for adenopathy. Does not bruise/bleed easily.  Psychiatric/Behavioral: Negative.  Negative for agitation and suicidal ideas. The patient is not nervous/anxious.     Objective:  BP 140/80 (BP Location: Left Arm, Patient Position: Sitting, Cuff Size: Normal)   Pulse (!) 55   Temp 97.9 F (36.6 C) (  Oral)   Resp 16   Ht 5\' 6"  (1.676 m)   Wt 140 lb (63.5 kg)   SpO2 97%   BMI 22.60 kg/m   BP Readings from Last 3 Encounters:  12/12/19 140/80  12/11/19 113/76  11/18/19 (!) 146/82    Wt Readings from Last 3 Encounters:  12/12/19 140 lb (63.5 kg)  12/11/19 139 lb (63 kg)  11/18/19 139 lb 6 oz (63.2 kg)    Physical Exam Vitals reviewed.  Constitutional:      General: She is not in acute distress.    Appearance: She is well-developed. She is  not ill-appearing, toxic-appearing or diaphoretic.  HENT:     Mouth/Throat:     Mouth: Mucous membranes are moist.  Eyes:     General: No scleral icterus.    Extraocular Movements: Extraocular movements intact.     Right eye: Normal extraocular motion and no nystagmus.     Left eye: Normal extraocular motion and no nystagmus.     Pupils: Pupils are equal, round, and reactive to light. Pupils are equal.     Right eye: Pupil is round and reactive.     Left eye: Pupil is round and reactive.  Cardiovascular:     Rate and Rhythm: Normal rate and regular rhythm.     Heart sounds: No murmur.  Pulmonary:     Effort: Pulmonary effort is normal.     Breath sounds: No stridor. No wheezing, rhonchi or rales.  Abdominal:     General: Abdomen is flat.     Palpations: There is no mass.     Tenderness: There is no abdominal tenderness. There is no guarding.  Musculoskeletal:        General: Normal range of motion.     Cervical back: Normal range of motion and neck supple.     Right lower leg: No edema.     Left lower leg: No edema.  Skin:    General: Skin is warm and dry.     Coloration: Skin is not pale.  Neurological:     General: No focal deficit present.     Mental Status: She is alert and oriented to person, place, and time. Mental status is at baseline.     Cranial Nerves: Cranial nerves are intact. No cranial nerve deficit.     Sensory: Sensation is intact.     Motor: Weakness (diffuse and symmetrical) present. No tremor, atrophy or seizure activity.     Coordination: Coordination is intact. Romberg sign negative. Coordination normal.     Gait: Gait normal.     Deep Tendon Reflexes: Reflexes normal. Babinski sign absent on the right side. Babinski sign absent on the left side.     Reflex Scores:      Tricep reflexes are 0 on the right side and 0 on the left side.      Bicep reflexes are 0 on the right side and 0 on the left side.      Brachioradialis reflexes are 0 on the right side  and 0 on the left side.      Patellar reflexes are 0 on the right side and 0 on the left side.      Achilles reflexes are 0 on the right side and 0 on the left side. Psychiatric:        Mood and Affect: Mood normal.        Behavior: Behavior normal.     Lab Results  Component Value Date  WBC 6.5 12/12/2019   HGB 13.4 12/12/2019   HCT 40.0 12/12/2019   PLT 168.0 12/12/2019   GLUCOSE 203 (H) 11/18/2019   CHOL 117 07/16/2019   TRIG 65.0 07/16/2019   HDL 49.80 07/16/2019   LDLCALC 54 07/16/2019   ALT 6 11/18/2019   AST 15 11/18/2019   NA 141 11/18/2019   K 3.5 11/18/2019   CL 104 11/18/2019   CREATININE 1.25 (H) 11/18/2019   BUN 21 11/18/2019   CO2 29 11/18/2019   TSH 2.660 04/29/2019   INR 2.0 (H) 11/18/2015   HGBA1C 7.1 (H) 11/18/2019   MICROALBUR 80 02/12/2019    CT Chest W Contrast  Result Date: 12/05/2019 CLINICAL DATA:  Follow-up thoracic aortic aneurysm. EXAM: CT CHEST WITH CONTRAST TECHNIQUE: Multidetector CT imaging of the chest was performed during intravenous contrast administration. CONTRAST:  62mL ISOVUE-300 IOPAMIDOL (ISOVUE-300) INJECTION 61% COMPARISON:  Chest CT 11/21/2014 FINDINGS: Cardiovascular: Stable fusiform aneurysmal dilatation of the ascending thoracic aorta, maximal dimension 4 cm. Aortic atherosclerosis without dissection. The descending aorta is tortuous but normal in caliber. Conventional branching pattern from the aortic arch. The previous central pulmonary artery filling defects have resolved. No definite residual or acute PE. Borderline cardiomegaly. No pericardial effusion. Mediastinum/Nodes: Small mediastinal lymph nodes that are not enlarged by size criteria. No thyroid nodule. No esophageal wall thickening. Lungs/Pleura: Mild emphysema. 3 mm subpleural right middle lobe pulmonary nodule, series 8, image 80, unchanged from 2016 and considered benign. Scattered subsegmental bandlike atelectasis or scarring in the right middle lobe, right lower lobe,  and lingula. No confluent airspace disease. The trachea and mainstem bronchi are patent. No pleural fluid. Upper Abdomen: Nonobstructing bilateral renal calculi. No hydronephrosis. Suspected hepatic steatosis. No acute findings in the upper abdomen. Musculoskeletal: Remote right rib fractures. Degenerative disc disease in the midthoracic spine. There are no acute or suspicious osseous abnormalities. IMPRESSION: 1. Stable fusiform aneurysmal dilatation of the ascending thoracic aorta, maximal dimension 4 cm. Recommend annual imaging followup by CTA or MRA. This recommendation follows 2010 ACCF/AHA/AATS/ACR/ASA/SCA/SCAI/SIR/STS/SVM Guidelines for the Diagnosis and Management of Patients with Thoracic Aortic Disease. Circulation. 2010; [id number]. Aortic aneurysm NOS (ICD10-I71.9) 2. Mild emphysema. 3. Nonobstructing bilateral renal calculi. Aortic Atherosclerosis (ICD10-I70.0) and Emphysema (ICD10-J43.9). Electronically Signed   By: Keith Rake M.D.   On: 12/05/2019 17:33   CT Head Wo Contrast  Result Date: 12/12/2019 CLINICAL DATA:  Acute left-sided headaches EXAM: CT HEAD WITHOUT CONTRAST TECHNIQUE: Contiguous axial images were obtained from the base of the skull through the vertex without intravenous contrast. COMPARISON:  MRI from 09/15/2017 FINDINGS: Brain: Mild atrophic changes are noted. 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 No findings to suggest acute hemorrhage, acute infarction or space-occupying mass lesion are noted. Vascular: No hyperdense vessel or unexpected calcification. Skull: Normal. Negative for fracture or focal lesion. Sinuses/Orbits: No acute finding. Other: None. IMPRESSION: Chronic changes stable from the previous exam. No acute abnormality noted. Electronically Signed   By: Inez Catalina M.D.   On: 12/12/2019 12:07    Assessment & Plan:   Varsha was seen today for headache.  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  Her CT scan is negative for mass, bleed, or CVA.  Her CBC is normal and her sed rate is normal which excludes temporal arteritis or vasculitis.  This may be a migraine equivalent.  I have asked  her to continue taking Tylenol as needed and to let me know if she develops any new or worsening symptoms. -     CT Head Wo Contrast; Future -     CBC with Differential/Platelet; Future -     Sedimentation rate; Future -     Sedimentation rate -     CBC with Differential/Platelet   I am having Lyman Bishop maintain her acetaminophen, nitroGLYCERIN, disopyramide, atorvastatin, alum & mag hydroxide-simeth, amLODipine, mirtazapine, metoprolol tartrate, lidocaine, metFORMIN, Xarelto, and lansoprazole.  No orders of the defined types were placed in this encounter.    Follow-up: Return in about 3 weeks (around 01/02/2020).  Scarlette Calico, MD

## 2020-01-02 ENCOUNTER — Encounter: Payer: Self-pay | Admitting: Internal Medicine

## 2020-01-02 ENCOUNTER — Other Ambulatory Visit: Payer: Self-pay

## 2020-01-02 ENCOUNTER — Ambulatory Visit (INDEPENDENT_AMBULATORY_CARE_PROVIDER_SITE_OTHER): Payer: Medicare Other | Admitting: Internal Medicine

## 2020-01-02 VITALS — BP 142/80 | HR 56 | Temp 98.3°F | Ht 66.0 in | Wt 137.6 lb

## 2020-01-02 DIAGNOSIS — M62838 Other muscle spasm: Secondary | ICD-10-CM

## 2020-01-02 LAB — CBC
HCT: 38.6 % (ref 36.0–46.0)
Hemoglobin: 12.8 g/dL (ref 12.0–15.0)
MCHC: 33.2 g/dL (ref 30.0–36.0)
MCV: 95.7 fl (ref 78.0–100.0)
Platelets: 178 10*3/uL (ref 150.0–400.0)
RBC: 4.03 Mil/uL (ref 3.87–5.11)
RDW: 13.5 % (ref 11.5–15.5)
WBC: 7.5 10*3/uL (ref 4.0–10.5)

## 2020-01-02 LAB — COMPREHENSIVE METABOLIC PANEL
ALT: 7 U/L (ref 0–35)
AST: 15 U/L (ref 0–37)
Albumin: 4 g/dL (ref 3.5–5.2)
Alkaline Phosphatase: 82 U/L (ref 39–117)
BUN: 23 mg/dL (ref 6–23)
CO2: 32 mEq/L (ref 19–32)
Calcium: 9.8 mg/dL (ref 8.4–10.5)
Chloride: 105 mEq/L (ref 96–112)
Creatinine, Ser: 1.3 mg/dL — ABNORMAL HIGH (ref 0.40–1.20)
GFR: 47.47 mL/min — ABNORMAL LOW (ref >60.00)
Glucose, Bld: 156 mg/dL — ABNORMAL HIGH (ref 70–99)
Potassium: 4 mEq/L (ref 3.5–5.1)
Sodium: 142 mEq/L (ref 135–145)
Total Bilirubin: 0.7 mg/dL (ref 0.2–1.2)
Total Protein: 6.8 g/dL (ref 6.0–8.3)

## 2020-01-02 LAB — MAGNESIUM: Magnesium: 2 mg/dL (ref 1.5–2.5)

## 2020-01-02 LAB — CK: Total CK: 49 U/L (ref 7–177)

## 2020-01-02 NOTE — Assessment & Plan Note (Signed)
She did ask if this could be associated with nervousness or worrying about things. It is possible. Checking CBC, CMP, magnesium, CK to rule out alternate etiology.

## 2020-01-02 NOTE — Progress Notes (Signed)
   Subjective:   Patient ID: Monique [name], female    DOB: 1935/10/07, 84 y.o.   MRN: [id number]  HPI The patient is an 84 YO female coming in for concerns about shaking in her arms. Feels like it is in the arm muscles and could be similar to a muscle cramp. She has taken tylenol for this and it did not help. Denies fevers or chills. There is no visible tremor or shaking when she experiences that. She denies numbness or weakness in her arms. Denies progression of symptoms. Happens a few times a week. Denies anything relieves symptoms. She denies an association with missing meals, eating. Denies food falling down when trying to eat or visible tremor. Denies numbness  Review of Systems  Constitutional: Negative.   HENT: Negative.   Eyes: Negative.   Respiratory: Negative for cough, chest tightness and shortness of breath.   Cardiovascular: Negative for chest pain, palpitations and leg swelling.  Gastrointestinal: Negative for abdominal distention, abdominal pain, constipation, diarrhea, nausea and vomiting.  Musculoskeletal: Negative.   Skin: Negative.   Neurological: Negative.        Shaking  Psychiatric/Behavioral: Negative.     Objective:  Physical Exam Constitutional:      Appearance: She is well-developed.  HENT:     Head: Normocephalic and atraumatic.  Cardiovascular:     Rate and Rhythm: Normal rate and regular rhythm.  Pulmonary:     Effort: Pulmonary effort is normal. No respiratory distress.     Breath sounds: Normal breath sounds. No wheezing or rales.  Abdominal:     General: Bowel sounds are normal. There is no distension.     Palpations: Abdomen is soft.     Tenderness: There is no abdominal tenderness. There is no rebound.  Musculoskeletal:        General: No tenderness.     Cervical back: Normal range of motion.  Skin:    General: Skin is warm and dry.  Neurological:     Mental Status: She is alert and oriented to person, place, and time.     Coordination:  Coordination normal.     Comments: No visible tremor or numbness on exam     Vitals:   01/02/20 1027  BP: (!) 150/84  Pulse: (!) 56  Temp: 98.3 F (36.8 C)  SpO2: 99%  Weight: 137 lb 9.6 oz (62.4 kg)  Height: 5\' 6"  (1.676 m)    This visit occurred during the SARS-CoV-2 public health emergency.  Safety protocols were in place, including screening questions prior to the visit, additional usage of staff PPE, and extensive cleaning of exam room while observing appropriate contact time as indicated for disinfecting solutions.   Assessment & Plan:

## 2020-01-02 NOTE — Patient Instructions (Signed)
We will check the labs work today to see if we can find the cause.

## 2020-01-23 DIAGNOSIS — Z961 Presence of intraocular lens: Secondary | ICD-10-CM | POA: Diagnosis not present

## 2020-01-23 DIAGNOSIS — H16143 Punctate keratitis, bilateral: Secondary | ICD-10-CM | POA: Diagnosis not present

## 2020-01-29 ENCOUNTER — Telehealth: Payer: Self-pay | Admitting: Internal Medicine

## 2020-01-29 NOTE — Telephone Encounter (Signed)
Please advise this 

## 2020-01-29 NOTE — Telephone Encounter (Signed)
New message:    Pt is calling and states she would like to know if something can be prescribed for her for the pain on the left side of her head. She states that Dr. Sharlet Salina is aware of it. She states she uses [id number] - [place], [place] - [address]. Please advise.

## 2020-01-30 NOTE — Telephone Encounter (Signed)
Appointment scheduled for 01-31-2020 at 940

## 2020-01-30 NOTE — Telephone Encounter (Signed)
It would appear she had a visit in early April with Dr. Ronnald Ramp for this. We would need to assess her at a visit to potentially prescribe something.

## 2020-01-31 ENCOUNTER — Other Ambulatory Visit: Payer: Self-pay

## 2020-01-31 ENCOUNTER — Ambulatory Visit (INDEPENDENT_AMBULATORY_CARE_PROVIDER_SITE_OTHER): Payer: Medicare Other | Admitting: Internal Medicine

## 2020-01-31 ENCOUNTER — Encounter: Payer: Self-pay | Admitting: Internal Medicine

## 2020-01-31 VITALS — BP 140/80 | HR 52 | Temp 98.4°F | Ht 66.0 in | Wt 141.6 lb

## 2020-01-31 DIAGNOSIS — R519 Headache, unspecified: Secondary | ICD-10-CM | POA: Diagnosis not present

## 2020-01-31 MED ORDER — SUMATRIPTAN SUCCINATE 50 MG PO TABS: 50.0000 mg | ORAL_TABLET | ORAL | 0 refills | Status: DC | PRN

## 2020-01-31 MED ORDER — METHYLPREDNISOLONE ACETATE 40 MG/ML IJ SUSP
40.0000 mg | Freq: Once | INTRAMUSCULAR | Status: AC
  Administered 2020-01-31: 40 mg via INTRAMUSCULAR

## 2020-01-31 NOTE — Progress Notes (Signed)
   Subjective:   Patient ID: Monique [name], female    DOB: Mar 26, 1936, 85 y.o.   MRN: [id number]  HPI The patient is an 84 YO female coming in for concerns about pain in her head. Previously she has had pain in the neck which went into her head for the last several years. These had previously been a couple times a week and she was taking tylenol for them. About 1-2 months ago she was seen here for 2 weeks of left temporal headache which she stated felt like a prior brain bleed. She was evaluated for this with CT and labs which were unrevealing. She is now having pain which is the same. She is not sure how long ago these left sided headaches started. She denies remembering anything specific which could have triggered them. No accidents or trauma. No rash or itching or numbness on the scalp. Headaches do not radiate to the neck. Is taking tylenol for them but this is not helping.  She is not having a headache today. Had one Tuesday, Thursday this week so far.   Review of Systems  Constitutional: Negative.   HENT: Negative.   Eyes: Negative.   Respiratory: Negative for cough, chest tightness and shortness of breath.   Cardiovascular: Negative for chest pain, palpitations and leg swelling.  Gastrointestinal: Negative for abdominal distention, abdominal pain, constipation, diarrhea, nausea and vomiting.  Musculoskeletal: Negative.   Skin: Negative.   Neurological: Positive for headaches.  Psychiatric/Behavioral: Negative.     Objective:  Physical Exam Constitutional:      Appearance: She is well-developed.  HENT:     Head: Normocephalic and atraumatic.  Cardiovascular:     Rate and Rhythm: Normal rate and regular rhythm.  Pulmonary:     Effort: Pulmonary effort is normal. No respiratory distress.     Breath sounds: Normal breath sounds. No wheezing or rales.  Abdominal:     General: Bowel sounds are normal. There is no distension.     Palpations: Abdomen is soft.     Tenderness: There  is no abdominal tenderness. There is no rebound.  Musculoskeletal:        General: No tenderness.     Cervical back: Normal range of motion.     Comments: No tenderness to palpation over the left scalp. Left temporal region pointed to by patient when asked where pain is typically with her headaches  Skin:    General: Skin is warm and dry.  Neurological:     Mental Status: She is alert and oriented to person, place, and time.     Coordination: Coordination normal.     Vitals:   01/31/20 0950  BP: 140/80  Pulse: (!) 52  Temp: 98.4 F (36.9 C)  SpO2: 98%  Weight: 141 lb 9.6 oz (64.2 kg)  Height: 5\' 6"  (1.676 m)    This visit occurred during the SARS-CoV-2 public health emergency.  Safety protocols were in place, including screening questions prior to the visit, additional usage of staff PPE, and extensive cleaning of exam room while observing appropriate contact time as indicated for disinfecting solutions.   Assessment & Plan:  Depo-medrol 40 mg IM given at visit

## 2020-01-31 NOTE — Patient Instructions (Signed)
We will give you a shot today to help stop the headaches.  We have sent in a medicine called sumatriptan which you can try instead of tylenol to stop a headache if it comes.

## 2020-01-31 NOTE — Assessment & Plan Note (Signed)
History is vague on when headaches started but at least 2 months ago without change in pattern. Sounds to be several times a week and she is not sure how long they last. Using tylenol without relief. Will try depo-medrol 40 mg IM today and rx sumatriptan for acute headaches.

## 2020-02-01 ENCOUNTER — Other Ambulatory Visit: Payer: Self-pay | Admitting: Internal Medicine

## 2020-02-04 ENCOUNTER — Other Ambulatory Visit: Payer: Self-pay

## 2020-02-04 MED ORDER — ATORVASTATIN CALCIUM 20 MG PO TABS: 20.0000 mg | ORAL_TABLET | Freq: Every day | ORAL | 1 refills | Status: DC

## 2020-02-12 ENCOUNTER — Other Ambulatory Visit: Payer: Self-pay | Admitting: Internal Medicine

## 2020-02-25 ENCOUNTER — Other Ambulatory Visit: Payer: Self-pay | Admitting: Internal Medicine

## 2020-03-13 ENCOUNTER — Encounter: Payer: Self-pay | Admitting: Internal Medicine

## 2020-03-13 ENCOUNTER — Ambulatory Visit: Payer: Medicare Other | Admitting: Internal Medicine

## 2020-03-13 ENCOUNTER — Other Ambulatory Visit: Payer: Self-pay

## 2020-03-13 VITALS — BP 142/84 | HR 56 | Ht 66.0 in | Wt 134.0 lb

## 2020-03-13 DIAGNOSIS — E782 Mixed hyperlipidemia: Secondary | ICD-10-CM

## 2020-03-13 DIAGNOSIS — I10 Essential (primary) hypertension: Secondary | ICD-10-CM

## 2020-03-13 NOTE — Patient Instructions (Signed)
Medication Instructions:  Your physician recommends that you continue on your current medications as directed. Please refer to the Current Medication list given to you today.  *If you need a refill on your cardiac medications before your next appointment, please call your pharmacy*   Lab Work: None Ordered If you have labs (blood work) drawn today and your tests are completely normal, you will receive your results only by: Marland Kitchen MyChart Message (if you have MyChart) OR . A paper copy in the mail If you have any lab test that is abnormal or we need to change your treatment, we will call you to review the results.   Testing/Procedures: None Ordered   Follow-Up: At [facility], you and your health needs are our priority.  As part of our continuing mission to provide you with exceptional heart care, we have created designated Provider Care Teams.  These Care Teams include your primary Cardiologist (physician) and Advanced Practice Providers (APPs -  Physician Assistants and Nurse Practitioners) who all work together to provide you with the care you need, when you need it.  We recommend signing up for the patient portal called "MyChart".  Sign up information is provided on this After Visit Summary.  MyChart is used to connect with patients for Virtual Visits (Telemedicine).  Patients are able to view lab/test results, encounter notes, upcoming appointments, etc.  Non-urgent messages can be sent to your provider as well.   To learn more about what you can do with MyChart, go to [email or web address].    Your next appointment:   7 month(s)  The format for your next appointment:   In Person  Provider:   You may see Dorris Carnes, MD or one of the following Advanced Practice Providers on your designated Care Team:    Richardson Dopp, PA-C  [place], [place]

## 2020-03-13 NOTE — Progress Notes (Signed)
Cardiology Office Note    Date:  03/13/2020   ID:  Monique, [name] Apr 12, 1936, MRN [id number]  PCP:  Hoyt Koch, MD  Cardiologist: Dr. Harrington Challenger  Chief Complaint: Follow up for HOCM & Afib  History of Present Illness:   Monique [name] is a 84 y.o. female with hx of HOCM, HTN, PAF, bilateral PE, DVT, CKD stage III and HLD presents for follow up.   Last echo 11/2014 showed LVEF of 65-70%, dynamic obstruction, severe LVH,  grade 1 DD, mild MR. Low risk stress test 11/2014.  Pt admitted in Jan 2019 with CP   Myovue showed no ischemia    I saw her in clinic 1 year ago  She was admitted in Dec 2020 with CP   Normal trop  Felt atypical   Did have an episode of SVT   Placed back on disopyramide and metoprolol increased.    Seen in clinic after   Myovue showed normal perfusion.   Echo in Jan 2021 LVEF 65 to 53% Grade II diastolic dysfunction.  She was seen by B Bhagat in Jan  Had intermitt CP  No changes made   I saw her in Feb and then she was seen by Kathleen Argue in April  The pt continues to have intermitt L sided CP   She points to L lateral chest   Not associate with activity    Also complains of L sided headaches  She denies palpitations       Past Medical History:  Diagnosis Date  . Abdominal pain 11/18/2015  . Acute respiratory failure with hypoxia ([place]) 06/27/2014  . Breast pain, left 10/11/2016  . Cerebral aneurysm 2000 06/27/2014  . Chest pain 10/30/2014  . CHF (congestive heart failure) ([place])   . Chronic anticoagulation 06/2014   Coumadin started after bilateral PE  . Chronic kidney disease, stage III (moderate) 11/21/2014  . Clotting disorder ([place])    right was removed, still have left cataract  . Diabetes ([place])   . Dizziness and giddiness 11/28/2014  . DVT of lower extremity (deep venous thrombosis) ([place]) 11/28/2014  . Dysuria 07/07/2014  . Elevated troponin 11/28/2014  . Fatty liver   . Gastritis 05/16/2012   Chronic dyspepsia on PPI daily.   Marland Kitchen Headache, acute  06/08/2010   Qualifier: Diagnosis of  By: Linda Hedges MD, Heinz Knuckles   . Heart murmur   . Hematochezia 11/18/2015  . History of fibromuscular dysplasia 06/27/2014  . HTN (hypertension)    Essential  . Hyperlipidemia   . Hypertrophic obstructive cardiomyopathy (HOCM) ([place]) 05/20/2008   Qualifier: Diagnosis of  By: Linda Hedges MD, Heinz Knuckles   . Kidney stones   . LVH (left ventricular hypertrophy)   . Orthostatic hypotension 11/28/2014  . PAF (paroxysmal atrial fibrillation) ([place]) 2000  . Palpitations 11/28/2014  . PAROXYSMAL ATRIAL FIBRILLATION 05/20/2008   Qualifier: Diagnosis of  By: Linda Hedges MD, Heinz Knuckles   . PE (pulmonary embolism) 06/2014   Bilateral  . Postmenopausal osteoporosis 04/2019   T score -3.3  . Pulmonary embolism, bilateral ([place]) 06/27/2014  . SOB (shortness of breath) 06/27/2014  . Subarachnoid hemorrhage ([place]) 2000  . UTI (urinary tract infection) 01/04/2015    Past Surgical History:  Procedure Laterality Date  . ABDOMINAL HYSTERECTOMY  2000  . Brain hemorrage     2000  . CARDIAC CATHETERIZATION  05/2008   Nl cors, no gradient, EF 60%    Current Medications: Prior to Admission medications   Medication Sig  Start Date End Date Taking? Authorizing Provider  amLODipine (NORVASC) 5 MG tablet Take 1 tablet (5 mg total) by mouth 2 (two) times daily. 12/02/16   Fay Records, MD  disopyramide (NORPACE) 150 MG capsule Take 1 capsule (150 mg total) by mouth 2 (two) times daily. 01/13/17   Fay Records, MD  metoprolol tartrate (LOPRESSOR) 25 MG tablet Take 1 tablet (25 mg total) by mouth 2 (two) times daily. 12/06/16   Fay Records, MD  mirtazapine (REMERON) 15 MG tablet TAKE 1 TABLET (15 MG TOTAL) BY MOUTH AT BEDTIME. 06/20/17   Hoyt Koch, MD  montelukast (SINGULAIR) 10 MG tablet Take 1 tablet (10 mg total) by mouth at bedtime. 06/20/17   Hoyt Koch, MD  nitroGLYCERIN (NITROSTAT) 0.4 MG SL tablet Place 1 tablet (0.4 mg total) under the tongue every 5 (five) minutes  as needed for chest pain. 03/30/15   Fay Records, MD  pantoprazole (PROTONIX) 40 MG tablet Take 1 tablet (40 mg total) by mouth daily. Need annual visit of further refills 06/07/17   Hoyt Koch, MD  Rivaroxaban (XARELTO) 15 MG TABS tablet Take 1 tablet (15 mg total) by mouth daily with supper. 01/17/17   Fay Records, MD  sertraline (ZOLOFT) 50 MG tablet TAKE 1 TABLET (50 MG TOTAL) BY MOUTH DAILY. 02/24/15   Hoyt Koch, MD  simvastatin (ZOCOR) 40 MG tablet TAKE 1 TABLET (40 MG TOTAL) BY MOUTH AT BEDTIME. 01/13/17   Fay Records, MD    Allergies:   Patient has no known allergies.   Social History   Socioeconomic History  . Marital status: Divorced    Spouse name: Not on file  . Number of children: 3  . Years of education: 48  . Highest education level: Not on file  Occupational History  . Occupation: Retired [employer]  . Smoking status: Former Smoker    Quit date: 09/06/1967    Years since quitting: 52.5  . Smokeless tobacco: Never Used  Vaping Use  . Vaping Use: Never used  Substance and Sexual Activity  . Alcohol use: No    Alcohol/week: 0.0 standard drinks  . Drug use: No  . Sexual activity: Not Currently    Comment: 1st intercourse 84yo-Fewer than 5 partners  Other Topics Concern  . Not on file  Social History Narrative   HSG. Married '61 - '88/divorced. 3 sons - '74, '59, '62, 1 daughter '63; 5 grandchildren. Lives alone in two story home. She does not drive.   Retired from [employer].   Social Determinants of Health   Financial Resource Strain:   . Difficulty of Paying Living Expenses:   Food Insecurity:   . Worried About Charity fundraiser in the Last Year:   . Arboriculturist in the Last Year:   Transportation Needs:   . Film/video editor (Medical):   Marland Kitchen Lack of Transportation (Non-Medical):   Physical Activity:   . Days of Exercise per Week:   . Minutes of Exercise per Session:   Stress:   .  Feeling of Stress :   Social Connections:   . Frequency of Communication with Friends and Family:   . Frequency of Social Gatherings with Friends and Family:   . Attends Religious Services:   . Active Member of Clubs or Organizations:   . Attends Archivist Meetings:   Marland Kitchen Marital Status:      Family History:  The patient's family history includes Arrhythmia in her mother and sister; Breast cancer (age of onset: 66) in her daughter; Breast cancer (age of onset: 33) in her mother; Heart disease in her mother; Stroke in her mother.   ROS:   Please see the history of present illness.    ROS All other systems reviewed and are negative.   PHYSICAL EXAM:   VS:  BP (!) 142/84   Pulse (!) 56   Ht 5\' 6"  (1.676 m)   Wt 134 lb (60.8 kg)   SpO2 95%   BMI 21.63 kg/m    GEN: Well nourished, well developed, in no acute distress  HEENT: normal  Neck: JVP is normal   Cardiac: RRR;No singif murmurs   No LE edema   2+ DP pulses Chest:  Tender on L lateral chest   Respiratory:  clear to auscultation bilaterally, normal work of breathing GI: soft, nontender, nondistended, + BS MS: no deformity or atrophy  Skin: warm and dry, no rash Neuro:  Alert and Oriented x 3, Strength and sensation are intact Psych: euthymic mood, full affect  Wt Readings from Last 3 Encounters:  03/13/20 134 lb (60.8 kg)  01/31/20 141 lb 9.6 oz (64.2 kg)  01/02/20 137 lb 9.6 oz (62.4 kg)      Studies/Labs Reviewed:   EKG:  EKG is not ordered today   Recent Labs: 04/29/2019: TSH 2.660 08/16/2019: Pro B Natriuretic peptide (BNP) 227.0 01/02/2020: ALT 7; BUN 23; Creatinine, Ser 1.30; Hemoglobin 12.8; Magnesium 2.0; Platelets 178.0; Potassium 4.0; Sodium 142   Lipid Panel    Component Value Date/Time   CHOL 117 07/16/2019 0907   CHOL 140 11/11/2016 0900   TRIG 65.0 07/16/2019 0907   HDL 49.80 07/16/2019 0907   HDL 51 11/11/2016 0900   CHOLHDL 2 07/16/2019 0907   VLDL 13.0 07/16/2019 0907   LDLCALC 54  07/16/2019 0907   LDLCALC 65 11/11/2016 0900    Additional studies/ records that were reviewed today include:   Echo   Jan 2021  1. Left ventricular ejection fraction, by visual estimation, is 65 to 70%. The left ventricle has normal function. There is mildly increased left ventricular hypertrophy. 2. Elevated left atrial pressure. 3. Left ventricular diastolic parameters are consistent with Grade II diastolic dysfunction (pseudonormalization). 4. The left ventricle has no regional wall motion abnormalities. 5. Intracavitary gradient up to 5 mmHG. 6. Global right ventricle has normal systolic function.The right ventricular size is normal. No increase in right ventricular wall thickness. 7. Left atrial size was mildly dilated. 8. The mitral valve is grossly normal. Trivial mitral valve regurgitation. 9. The tricuspid valve is grossly normal. 10. The aortic valve is tricuspid. Aortic valve regurgitation is not visualized. No evidence of aortic valve sclerosis or stenosis. 11. Mildly elevated pulmonary artery systolic pressure. 12. The tricuspid regurgitant velocity is 3.00 m/s, and with an assumed right atrial pressure of 10 mmHg, the estimated right ventricular systolic pressure is mildly elevated at 45.9 mmHg. In comparison to the previous echocardiogram(s): EF remains unchanged on this study. Intracavitary gradient ~5 mmHG on the present study, and noted to be up to 34 mmHG on the prior study. No obvious asymmetric hypertrophy or SAM on this study. Compared to 09/08/2017.  Myovue Dec 2020   The left ventricular ejection fraction is normal (55-65%).  Nuclear stress EF: 65%.  No T wave inversion was noted during stress.  There was no ST segment deviation noted during stress.  This is a low  risk study.   Normal perfusion. LVEF 65% with normal wall motion. This is a low risk study.   ASSESSMENT & PLAN:   1  Chest pain  The current CP she is experiencing appears more  musculoskeletal   I do not think cardiac    Follow    2  PAF . Denies palpitations  Continue metoprolol, Norpace and Xarelto 15mg  qd.   3. HTN BP is mildly increased   At home it is better   110s   /   Keep on same meds    I would not push for now     4. HOCM Echo noted above   Only mild LVH on echo with no obstruction   Follow    5   PE  Continue on Xarelto    6. HLD -  -Continue statin.  7     Headache   May be referred from back  Given names of Sharlene Motts PT and alos Z Tamala Julian  9   Hx SAH     Medication Adjustments/Labs and Tests Ordered: Current medicines are reviewed at length with the patient today.  Concerns regarding medicines are outlined above.  Medication changes, Labs and Tests ordered today are listed in the Patient Instructions below. There are no Patient Instructions on file for this visit.   Signed, Dorris Carnes, MD  03/13/2020 9:39 AM    [address], [place], [place]  [address] Phone: [phone]; Fax: [phone]

## 2020-04-09 ENCOUNTER — Other Ambulatory Visit: Payer: Self-pay | Admitting: Internal Medicine

## 2020-04-17 ENCOUNTER — Encounter: Payer: Self-pay | Admitting: Internal Medicine

## 2020-04-17 ENCOUNTER — Other Ambulatory Visit: Payer: Self-pay

## 2020-04-17 ENCOUNTER — Ambulatory Visit (INDEPENDENT_AMBULATORY_CARE_PROVIDER_SITE_OTHER): Payer: Medicare Other | Admitting: Internal Medicine

## 2020-04-17 VITALS — BP 118/76 | HR 64 | Temp 98.1°F | Ht 66.0 in | Wt 136.0 lb

## 2020-04-17 DIAGNOSIS — E785 Hyperlipidemia, unspecified: Secondary | ICD-10-CM | POA: Diagnosis not present

## 2020-04-17 DIAGNOSIS — E1169 Type 2 diabetes mellitus with other specified complication: Secondary | ICD-10-CM | POA: Diagnosis not present

## 2020-04-17 DIAGNOSIS — N3000 Acute cystitis without hematuria: Secondary | ICD-10-CM | POA: Diagnosis not present

## 2020-04-17 DIAGNOSIS — E119 Type 2 diabetes mellitus without complications: Secondary | ICD-10-CM

## 2020-04-17 MED ORDER — NITROFURANTOIN MONOHYD MACRO 100 MG PO CAPS
100.0000 mg | ORAL_CAPSULE | Freq: Two times a day (BID) | ORAL | 0 refills | Status: DC
Start: 2020-04-17 — End: 2020-06-19

## 2020-04-17 NOTE — Assessment & Plan Note (Signed)
Checking HgA1c and adjust metformin as needed. On statin but not ACE-I/ARB.

## 2020-04-17 NOTE — Assessment & Plan Note (Signed)
Rx macrobid, patient unable to leave sample during visit so treated empirically.

## 2020-04-17 NOTE — Progress Notes (Signed)
° °  Subjective:   Patient ID: Monique [name], female    DOB: 1935/10/09, 84 y.o.   MRN: [id number]  HPI The patient is an 84 YO female coming in for possible UTI (some suprapubic pain and urgency and frequency, started about 2-3 days ago, denies fevers or chills, denies nausea or vomiting) and diabetes follow up (taking metformin daily, taking statin not on ACE-I/ARB, complicated by hyperlipidemia) and cholesterol follow up (taking lipitor 20 mg daily, denies missing doses, denies chest pains or stroke symptoms).   Review of Systems  Constitutional: Negative.   HENT: Negative.   Eyes: Negative.   Respiratory: Negative.  Negative for cough, chest tightness and shortness of breath.   Cardiovascular: Negative.  Negative for chest pain, palpitations and leg swelling.  Gastrointestinal: Negative for abdominal distention, abdominal pain, constipation, diarrhea, nausea and vomiting.  Genitourinary: Positive for dysuria, frequency and urgency.  Musculoskeletal: Negative.   Skin: Negative.   Neurological: Negative.   Psychiatric/Behavioral: Negative.     Objective:  Physical Exam Constitutional:      Appearance: She is well-developed.  HENT:     Head: Normocephalic and atraumatic.  Cardiovascular:     Rate and Rhythm: Normal rate and regular rhythm.  Pulmonary:     Effort: Pulmonary effort is normal. No respiratory distress.     Breath sounds: Normal breath sounds. No wheezing or rales.  Abdominal:     General: Bowel sounds are normal. There is no distension.     Palpations: Abdomen is soft.     Tenderness: There is abdominal tenderness. There is no rebound.     Comments: Mild suprapubic tenderness  Musculoskeletal:     Cervical back: Normal range of motion.  Skin:    General: Skin is warm and dry.  Neurological:     Mental Status: She is alert and oriented to person, place, and time.     Coordination: Coordination normal.     Vitals:   04/17/20 1412  BP: 118/76  Pulse: 64    Temp: 98.1 F (36.7 C)  TempSrc: Oral  SpO2: 96%  Weight: 136 lb (61.7 kg)  Height: 5\' 6"  (1.676 m)   This visit occurred during the SARS-CoV-2 public health emergency.  Safety protocols were in place, including screening questions prior to the visit, additional usage of staff PPE, and extensive cleaning of exam room while observing appropriate contact time as indicated for disinfecting solutions.   Assessment & Plan:

## 2020-04-17 NOTE — Assessment & Plan Note (Signed)
Taking lipitor and checking lipid panel and adjust as needed.  

## 2020-04-17 NOTE — Patient Instructions (Addendum)
We have sent in macrobid to treat the likely urinary tract infection. Take 1 pill twice a day for 1 week.   We are checking the labs for the diabetes today.

## 2020-04-18 ENCOUNTER — Other Ambulatory Visit: Payer: Self-pay | Admitting: Internal Medicine

## 2020-04-18 LAB — COMPREHENSIVE METABOLIC PANEL
AG Ratio: 1.5 (calc) (ref 1.0–2.5)
ALT: 9 U/L (ref 6–29)
AST: 18 U/L (ref 10–35)
Albumin: 3.9 g/dL (ref 3.6–5.1)
Alkaline phosphatase (APISO): 87 U/L (ref 37–153)
BUN/Creatinine Ratio: 20 (calc) (ref 6–22)
BUN: 32 mg/dL — ABNORMAL HIGH (ref 7–25)
CO2: 30 mmol/L (ref 20–32)
Calcium: 10.1 mg/dL (ref 8.6–10.4)
Chloride: 106 mmol/L (ref 98–110)
Creat: 1.58 mg/dL — ABNORMAL HIGH (ref 0.60–0.88)
Globulin: 2.6 g/dL (calc) (ref 1.9–3.7)
Glucose, Bld: 69 mg/dL (ref 65–99)
Potassium: 4.3 mmol/L (ref 3.5–5.3)
Sodium: 141 mmol/L (ref 135–146)
Total Bilirubin: 0.4 mg/dL (ref 0.2–1.2)
Total Protein: 6.5 g/dL (ref 6.1–8.1)

## 2020-04-18 LAB — CBC
HCT: 40.3 % (ref 35.0–45.0)
Hemoglobin: 12.9 g/dL (ref 11.7–15.5)
MCH: 31 pg (ref 27.0–33.0)
MCHC: 32 g/dL (ref 32.0–36.0)
MCV: 96.9 fL (ref 80.0–100.0)
MPV: 11.2 fL (ref 7.5–12.5)
Platelets: 189 10*3/uL (ref 140–400)
RBC: 4.16 10*6/uL (ref 3.80–5.10)
RDW: 12.6 % (ref 11.0–15.0)
WBC: 8.2 10*3/uL (ref 3.8–10.8)

## 2020-04-18 LAB — HEMOGLOBIN A1C
Hgb A1c MFr Bld: 6.7 % of total Hgb — ABNORMAL HIGH (ref <5.7)
Mean Plasma Glucose: 146 (calc)
eAG (mmol/L): 8.1 (calc)

## 2020-04-18 LAB — LIPID PANEL
Cholesterol: 121 mg/dL (ref <200)
HDL: 49 mg/dL — ABNORMAL LOW (ref >=50)
LDL Cholesterol (Calc): 52 mg/dL (calc)
Non-HDL Cholesterol (Calc): 72 mg/dL (calc) (ref <130)
Total CHOL/HDL Ratio: 2.5 (calc) (ref <5.0)
Triglycerides: 122 mg/dL (ref <150)

## 2020-05-05 ENCOUNTER — Other Ambulatory Visit: Payer: Self-pay | Admitting: Internal Medicine

## 2020-05-08 ENCOUNTER — Telehealth: Payer: Self-pay | Admitting: Internal Medicine

## 2020-05-08 NOTE — Progress Notes (Signed)
  Chronic Care Management   Outreach Note  05/08/2020 Name: Monique [name] MRN: [id number] DOB: 22-Aug-1936  Referred by: Hoyt Koch, MD Reason for referral : No chief complaint on file.   An unsuccessful telephone outreach was attempted today. The patient was referred to the pharmacist for assistance with care management and care coordination.   Follow Up Plan:   Earney Hamburg Upstream Scheduler

## 2020-05-14 ENCOUNTER — Telehealth: Payer: Self-pay | Admitting: Internal Medicine

## 2020-05-14 NOTE — Progress Notes (Signed)
  Chronic Care Management   Note  05/14/2020 Name: Monique [name] MRN: [id number] DOB: 14-Nov-1935  Monique [name] is a 84 y.o. year old female who is a primary care patient of Hoyt Koch, MD. I reached out to Loma Boston by phone today in response to a referral sent by [name]'s PCP, Hoyt Koch, MD.   [name] was given information about Chronic Care Management services today including:  1. CCM service includes personalized support from designated clinical staff supervised by her physician, including individualized plan of care and coordination with other care providers 2. 24/7 contact phone numbers for assistance for urgent and routine care needs. 3. Service will only be billed when office clinical staff spend 20 minutes or more in a month to coordinate care. 4. Only one practitioner may furnish and bill the service in a calendar month. 5. The patient may stop CCM services at any time (effective at the end of the month) by phone call to the office staff.   Patient agreed to services and verbal consent obtained.   Follow up plan:   Earney Hamburg Upstream Scheduler

## 2020-05-23 DIAGNOSIS — M25552 Pain in left hip: Secondary | ICD-10-CM | POA: Diagnosis not present

## 2020-06-09 ENCOUNTER — Telehealth: Payer: Self-pay | Admitting: Pharmacist

## 2020-06-09 ENCOUNTER — Ambulatory Visit: Payer: Medicare Other | Admitting: Pharmacist

## 2020-06-09 ENCOUNTER — Other Ambulatory Visit: Payer: Self-pay

## 2020-06-09 DIAGNOSIS — I10 Essential (primary) hypertension: Secondary | ICD-10-CM

## 2020-06-09 DIAGNOSIS — E1169 Type 2 diabetes mellitus with other specified complication: Secondary | ICD-10-CM

## 2020-06-09 DIAGNOSIS — I480 Paroxysmal atrial fibrillation: Secondary | ICD-10-CM

## 2020-06-09 DIAGNOSIS — E119 Type 2 diabetes mellitus without complications: Secondary | ICD-10-CM

## 2020-06-09 DIAGNOSIS — R519 Headache, unspecified: Secondary | ICD-10-CM

## 2020-06-09 NOTE — Chronic Care Management (AMB) (Signed)
Chronic Care Management Pharmacy  Name: Monique [name]  MRN: [id number] DOB: [birth year]   Chief Complaint/ HPI  Monique [name],  84 y.o. , female presents for their Initial CCM visit with the clinical pharmacist via telephone due to COVID-19 Pandemic.  PCP : Hoyt Koch, MD Patient Care Team: Hoyt Koch, MD as PCP - General (Internal Medicine) Fay Records, MD as PCP - Cardiology (Cardiology) Fay Records, MD (Cardiology) Charlton Haws, [facility] as Pharmacist (Pharmacist)  Their chronic conditions include: Hypertension, Hyperlipidemia, Diabetes, Atrial Fibrillation, Chronic Kidney Disease and Allergic Rhinitis, Hx PE, hx SAH (2000)  Office Visits: 04/17/20 Dr Sharlet Salina OV: acute visit for possible UTI. Rx'd Macrobid empirically. A1c and cholesterol stable, no med changes.  01/31/20 Dr Sharlet Salina OV: acute visit for headaches x at least 2 months. Tylenol not helping. Given Depo Medrol in office and rx'd sumatriptan.  01/02/20 Dr Sharlet Salina OV: acute visit for muscle spasm. Labs WNL, may try OTC magnesium.  11/18/19 Dr Sharlet Salina OV: acute visit for chest tightness. May be GERD, switched pantoprazole to Prevacid.  Consult Visit: 03/13/20 Dr Harrington Challenger (cardiology): chest pain appears Musculoskeletal. No med changes. Headache may be referred pain from back, rec'd PT.  No Known Allergies  Medications: Outpatient Encounter Medications as of 06/09/2020  Medication Sig  . acetaminophen (TYLENOL) 500 MG tablet Take 1,000 mg by mouth.  Marland Kitchen alum & mag hydroxide-simeth (MAALOX/MYLANTA) 200-200-20 MG/5ML suspension Take 15 mLs by mouth every 4 (four) hours as needed for indigestion or heartburn.  Marland Kitchen amLODipine (NORVASC) 5 MG tablet TAKE 1 TABLET BY MOUTH 2 TIMES A DAY  . atorvastatin (LIPITOR) 20 MG tablet Take 1 tablet (20 mg total) by mouth daily at 6 PM.  . disopyramide (NORPACE) 150 MG capsule TAKE 1 CAPSULE (150 MG TOTAL) BY MOUTH 2 (TWO) TIMES DAILY.  Marland Kitchen lidocaine (LIDODERM)  5 % Place 1 patch onto the skin daily.  . metFORMIN (GLUCOPHAGE) 500 MG tablet TAKE 1 TABLET (500 MG TOTAL) BY MOUTH DAILY WITH BREAKFAST.  . metoprolol tartrate (LOPRESSOR) 50 MG tablet Take 1 tablet (50 mg total) by mouth 2 (two) times daily.  . mirtazapine (REMERON) 15 MG tablet TAKE 1 TABLET BY MOUTH EVERYDAY AT BEDTIME  . nitrofurantoin, macrocrystal-monohydrate, (MACROBID) 100 MG capsule Take 1 capsule (100 mg total) by mouth 2 (two) times daily.  . nitroGLYCERIN (NITROSTAT) 0.4 MG SL tablet PLACE 1 TABLET (0.4 MG TOTAL) UNDER THE TONGUE EVERY 5 (FIVE) MINUTES AS NEEDED FOR CHEST PAIN.  Marland Kitchen pantoprazole (PROTONIX) 40 MG tablet TAKE 1 TABLET BY MOUTH EVERY DAY  . SUMAtriptan (IMITREX) 50 MG tablet TAKE 1 TABLET BY MOUTH EVERY DAY AS NEEDED. MAY REPEAT IN 2 HOURS IF HEADACHE PERSISTS OR RECURS.  Marland Kitchen XARELTO 15 MG TABS tablet TAKE 1 TABLET BY MOUTH EVERY DAY WITH SUPPER  . lansoprazole (PREVACID) 30 MG capsule Take 1 capsule (30 mg total) by mouth daily at 12 noon. (Patient not taking: Reported on 06/09/2020)  . metoprolol tartrate (LOPRESSOR) 25 MG tablet TAKE 1 TABLET BY MOUTH 2 TIMES A DAY (Patient not taking: Reported on 06/09/2020)   No facility-administered encounter medications on file as of 06/09/2020.    Wt Readings from Last 3 Encounters:  04/17/20 136 lb (61.7 kg)  03/13/20 134 lb (60.8 kg)  01/31/20 141 lb 9.6 oz (64.2 kg)    Current Diagnosis/Assessment:  SDOH Interventions     Most Recent Value  SDOH Interventions  Financial Strain Interventions Intervention Not Indicated  Goals Addressed            This Visit's Progress   . Pharmacy Care Plan       CARE PLAN ENTRY (see longitudinal plan of care for additional care plan information)  Current Barriers:  . Chronic Disease Management support, education, and care coordination needs related to Hypertension, Hyperlipidemia, Diabetes, Atrial Fibrillation, and Headaches   Hypertension BP Readings from Last 3  Encounters:  04/17/20 118/76  03/13/20 (!) 142/84  01/31/20 140/80 .  Pharmacist Clinical Goal(s): o Over the next 180 days, patient will work with PharmD and providers to maintain BP goal <130/80 . Current regimen:  o Amlodipine 5 mg twice a day o Metoprolol tartrate 50 mg twice a day . Interventions: o Discussed BP goals and benefits of medications for prevention of heart attack / stroke . Patient self care activities - Over the next 180 days, patient will: o Check BP daily, document, and provide at future appointments o Ensure daily salt intake < 2300 mg/day  Hyperlipidemia Lab Results  Component Value Date/Time   LDLCALC 52 04/17/2020 02:51 PM .  Pharmacist Clinical Goal(s): o Over the next 180 days, patient will work with PharmD and providers to maintain LDL goal < 70 . Current regimen:  o Atorvastatin 20 mg daily at bedtime . Interventions: o Discussed cholesterol goals and benefits of medications for prevention of heart attack / stroke . Patient self care activities - Over the next 180 days, patient will: o Continue medication as prescribed  Diabetes Lab Results  Component Value Date/Time   HGBA1C 6.7 (H) 04/17/2020 02:51 PM   HGBA1C 7.1 (H) 11/18/2019 10:55 AM .  Pharmacist Clinical Goal(s): o Over the next 180 days, patient will work with PharmD and providers to maintain A1c goal <7% . Current regimen:  o Metformin 500 mg daily at breakfast . Interventions: o Discussed A1c goals and benefits of medications for prevention of diabetic complications . Patient self care activities - Over the next 180 days, patient will: o Continue medication as prescribed  Atrial Fibrillation . Pharmacist Clinical Goal(s) o Over the next 180 days, patient will work with PharmD and providers to optimize therapy . Current regimen:  o Metoprolol tartrate 50 mg twice a day o Disopyramide 150 mg twice a day o Xarelto 15 mg daily PM . Interventions: o Discussed importance of  medications for prevention of Afib and stroke o Discussed risk of bleeding with Xarelto; advised to avoid NSAIDs . Patient self care activities - Over the next 180 days, patient will: o Continue medications as prescribed o Monitor for signs/symptoms of bleeding  Headaches . Pharmacist Clinical Goal(s) o Over the next 180 days, patient will work with PharmD and providers to optimize therapy . Current regimen:  o Sumatriptan 100 mg as needed o Tylenol 500 mg 1-2 tablets as needed . Interventions: o Discussed types of headaches and benefits of sumatriptan for migraine headaches o Advised patient to make appointment with PCP to discuss further; may require neurology referral . Patient self care activities - Over the next 180 days, patient will: o Make appt with PCP to discuss headaches  Medication management . Pharmacist Clinical Goal(s): o Over the next 180 days, patient will work with PharmD and providers to maintain optimal medication adherence . Current pharmacy: [facility] . Interventions o Comprehensive medication review performed. o Continue current medication management strategy . Patient self care activities - Over the next 180 days, patient will: o Focus on medication adherence by fill date  o Take medications as prescribed o Report any questions or concerns to PharmD and/or provider(s)  Initial goal documentation       Hypertension   BP goal is:  <130/80  Office blood pressures are  BP Readings from Last 3 Encounters:  04/17/20 118/76  03/13/20 (!) 142/84  01/31/20 140/80   Pulse Readings from Last 3 Encounters:  04/17/20 64  03/13/20 (!) 56  01/31/20 (!) 52   Kidney Function Lab Results  Component Value Date/Time   CREATININE 1.58 (H) 04/17/2020 02:51 PM   CREATININE 1.30 (H) 01/02/2020 10:55 AM   CREATININE 1.25 (H) 11/18/2019 10:55 AM   CREATININE 1.44 (H) 09/17/2015 04:24 PM   GFR 47.47 (L) 01/02/2020 10:55 AM   GFRNONAA 43 (L) 08/22/2019 02:38 AM   GFRAA  50 (L) 08/22/2019 02:38 AM   K 4.3 04/17/2020 02:51 PM   K 4.0 01/02/2020 10:55 AM   Patient checks BP at home daily Patient home BP readings are ranging: 115/70s - 120s/80s, HR 50s  Patient has failed these meds in the past: n/a Patient is currently controlled on the following medications:  . Amlodipine 5 mg BID . Metoprolol tartrate 50 mg BID . Metoprolol tartrate 25 mg BID - not taking  We discussed diet and exercise extensively; BP goals; benefits of medications; pt has 2 active prescriptions for metoprolol 25 mg and 50 mg, however she reports she is only taking 50 mg right now. HR is on low side of normal, will monitor for now  Plan  Continue current medications  Continue to monitor BP/HR at home  AFIB   Patient is currently rate controlled. Office heart rates are  Pulse Readings from Last 3 Encounters:  04/17/20 64  03/13/20 (!) 56  01/31/20 (!) 52   CHA2DS2-VASc Score = 8  The patient's score is based upon: CHF History: 0 HTN History: 1 Age : 2 Diabetes History: 1 Stroke History: 2 Vascular Disease History: 1 Gender: 1  Patient has failed these meds in past: n/a Patient is currently controlled on the following medications:  Marland Kitchen Metoprolol tartrate 50 mg BID . Disopyramide 150 mg BID . Xarelto 15 mg daily PM  We discussed: benefits of medications for Afib/stroke prevention; pt denies bleeding issues; she can sometimes feel when she is in Afib; she reports it does not happen often; denies other issues or concerns  Plan  Continue current medications   Hyperlipidemia   LDL goal < 70 Cath 2009 no CAD. Myoview 2020 low risk.  Lipid Panel     Component Value Date/Time   CHOL 121 04/17/2020 1451   CHOL 140 11/11/2016 0900   TRIG 122 04/17/2020 1451   HDL 49 (L) 04/17/2020 1451   HDL 51 11/11/2016 0900   LDLCALC 52 04/17/2020 1451    Hepatic Function Latest Ref Rng & Units 04/17/2020 01/02/2020 11/18/2019  Total Protein 6.1 - 8.1 g/dL 6.5 6.8 7.0  Albumin  3.5 - 5.2 g/dL - 4.0 3.8  AST 10 - 35 U/L _0 ALT 6 - 29 U/L _1 Alk Phosphatase 39 - 117 U/L - 82 85  Total Bilirubin 0.2 - 1.2 mg/dL 0.4 0.7 0.5  Bilirubin, Direct 0.0 - 0.3 mg/dL - - -    The ASCVD Risk score (Cantrall., et al., 2013) failed to calculate for the following reasons:   The 2013 ASCVD risk score is only valid for ages 57 to 29   Patient has failed these meds in  past: n/a Patient is currently controlled on the following medications:  . Atorvastatin 20 mg daily HS . Nitroglycerin 0.4 mg SL prn  We discussed: Cholesterol goals; benefits of statin for ASCVD risk reduction; pt denies side effects; she has not used NTG in a "long time"  Plan  Continue current medications  Diabetes   A1c goal <7%  Recent Relevant Labs: Lab Results  Component Value Date/Time   HGBA1C 6.7 (H) 04/17/2020 02:51 PM   HGBA1C 7.1 (H) 11/18/2019 10:55 AM   GFR 47.47 (L) 01/02/2020 10:55 AM   GFR 49.69 (L) 11/18/2019 10:55 AM   MICROALBUR 80 02/12/2019 02:30 PM    Last diabetic Eye exam:  Lab Results  Component Value Date/Time   HMDIABEYEEXA No Retinopathy 11/21/2017 12:00 AM    Last diabetic Foot exam: No results found for: HMDIABFOOTEX   Checking BG: Never  Patient has failed these meds in past: n/a Patient is currently controlled on the following medications: Marland Kitchen Metformin 500 mg daily AM  We discussed: A1c goal; benefits of metformin  Plan  Continue current medications  GERD   Patient has failed these meds in past: n/a Patient is currently controlled on the following medications:  . Pantoprazole 40 mg HS  . Lansoprazole 30 mg daily  -not taking  . Maalox PRN  We discussed:  Pt has active rx's for bother pantoprazole and lansoprazole; pt reports she is only taking pantoprazole now; she denies issues with GERD; advised pt lansoprazole and pantoprazole should not be taken together d/t duplicate therapy  Plan  Continue current medications  Depression    Depression screen [facility] 2/9 04/17/2020 02/12/2019 07/16/2018  Decreased Interest 0 0 0  Down, Depressed, Hopeless 0 0 0  PHQ - 2 Score 0 0 0  Some recent data might be hidden   Patient has failed these meds in past: sertraline Patient is currently controlled on the following medications:  Marland Kitchen Mirtazapine 15 mg HS  Remeron originally started 2016 to help with mood and appetite.  We discussed: indication and benefits of mirtazapine; pt reports she sleeps well, except for waking up to go to the bathroom several times per night  Plan  Continue current medications  Nocturia   Patient has failed these meds in past: n/a Patient is currently uncontrolled on the following medications:  . No medications  We discussed:  Pt reports walking up to urinate 6-7 times per night; she reports she does not have issues with frequent urination in the daytime; she has never taken medication for overactive bladder; discussed avoiding drinking fluids after 6-7 pm; pt is not interested in pursuing medication at this time  Plan  Recommend to avoid drinking fluids after 6pm  Headache   Patient has failed these meds in past: n/a Patient is currently uncontrolled on the following medications:  . Sumatriptan 50 mg PRN . Tylenol 500 mg PRN  We discussed:  Pt reports sumatriptan helped very little with headaches; she still uses Tylenol for headaches; we discussed different types of headaches and their treatments; advised to avoid NSAIDs due to Xarelto/hx [facility]; advised pt to make appt to discuss headaches with PCP, may merit referral  Plan  Continue current medications  Pt to make appt with PCP to discuss  Health Maintenance   Patient is currently controlled on the following medications:  Marland Kitchen Magnesium  . Vitamin D  We discussed: Patient is satisfied with current OTC regimen and denies issues. Magnesium has helped with cramps  Plan  Continue current  medications   Medication Management   [facility] Uses pill box? No - keeps in bottles Pt endorses 100% compliance  We discussed: Current pharmacy is preferred with insurance plan and patient is satisfied with pharmacy services  Plan  Continue current medication management strategy    Follow up: 6 month phone visit  Charlene Brooke, PharmD, BCACP Clinical Pharmacist [facility] [phone]

## 2020-06-09 NOTE — Patient Instructions (Addendum)
Visit Information  Phone number for Pharmacist: [phone]  Thank you for meeting with me to discuss your medications! I look forward to working with you to achieve your health care goals. Below is a summary of what we talked about during the visit:  Goals Addressed            This Visit's Progress   . Pharmacy Care Plan       CARE PLAN ENTRY (see longitudinal plan of care for additional care plan information)  Current Barriers:  . Chronic Disease Management support, education, and care coordination needs related to Hypertension, Hyperlipidemia, Diabetes, Atrial Fibrillation, and Headaches   Hypertension BP Readings from Last 3 Encounters:  04/17/20 118/76  03/13/20 (!) 142/84  01/31/20 140/80 .  Pharmacist Clinical Goal(s): o Over the next 180 days, patient will work with PharmD and providers to maintain BP goal <130/80 . Current regimen:  o Amlodipine 5 mg twice a day o Metoprolol tartrate 50 mg twice a day . Interventions: o Discussed BP goals and benefits of medications for prevention of heart attack / stroke . Patient self care activities - Over the next 180 days, patient will: o Check BP daily, document, and provide at future appointments o Ensure daily salt intake < 2300 mg/day  Hyperlipidemia Lab Results  Component Value Date/Time   LDLCALC 52 04/17/2020 02:51 PM .  Pharmacist Clinical Goal(s): o Over the next 180 days, patient will work with PharmD and providers to maintain LDL goal < 70 . Current regimen:  o Atorvastatin 20 mg daily at bedtime . Interventions: o Discussed cholesterol goals and benefits of medications for prevention of heart attack / stroke . Patient self care activities - Over the next 180 days, patient will: o Continue medication as prescribed  Diabetes Lab Results  Component Value Date/Time   HGBA1C 6.7 (H) 04/17/2020 02:51 PM   HGBA1C 7.1 (H) 11/18/2019 10:55 AM .  Pharmacist Clinical Goal(s): o Over the next 180 days, patient will  work with PharmD and providers to maintain A1c goal <7% . Current regimen:  o Metformin 500 mg daily at breakfast . Interventions: o Discussed A1c goals and benefits of medications for prevention of diabetic complications . Patient self care activities - Over the next 180 days, patient will: o Continue medication as prescribed  Atrial Fibrillation . Pharmacist Clinical Goal(s) o Over the next 180 days, patient will work with PharmD and providers to optimize therapy . Current regimen:  o Metoprolol tartrate 50 mg twice a day o Disopyramide 150 mg twice a day o Xarelto 15 mg daily PM . Interventions: o Discussed importance of medications for prevention of Afib and stroke o Discussed risk of bleeding with Xarelto; advised to avoid NSAIDs . Patient self care activities - Over the next 180 days, patient will: o Continue medications as prescribed o Monitor for signs/symptoms of bleeding  Headaches . Pharmacist Clinical Goal(s) o Over the next 180 days, patient will work with PharmD and providers to optimize therapy . Current regimen:  o Sumatriptan 100 mg as needed o Tylenol 500 mg 1-2 tablets as needed . Interventions: o Discussed types of headaches and benefits of sumatriptan for migraine headaches o Advised patient to make appointment with PCP to discuss further; may require neurology referral . Patient self care activities - Over the next 180 days, patient will: o Make appt with PCP to discuss headaches  Medication management . Pharmacist Clinical Goal(s): o Over the next 180 days, patient will work with PharmD and  providers to maintain optimal medication adherence . Current [facility]: [facility] . Interventions o Comprehensive medication review performed. o Continue current medication management strategy . Patient self care activities - Over the next 180 days, patient will: o Focus on medication adherence by fill date o Take medications as prescribed o Report any questions or  concerns to PharmD and/or provider(s)  Initial goal documentation      Monique [name] was given information about Chronic Care Management services today including:  1. CCM service includes personalized support from designated clinical staff supervised by her physician, including individualized plan of care and coordination with other care providers 2. 24/7 contact phone numbers for assistance for urgent and routine care needs. 3. Standard insurance, coinsurance, copays and deductibles apply for chronic care management only during months in which we provide at least 20 minutes of these services. Most insurances cover these services at 100%, however patients may be responsible for any copay, coinsurance and/or deductible if applicable. This service may help you avoid the need for more expensive face-to-face services. 4. Only one practitioner may furnish and bill the service in a calendar month. 5. The patient may stop CCM services at any time (effective at the end of the month) by phone call to the office staff.  Patient agreed to services and verbal consent obtained.   Patient verbalizes understanding of instructions provided today.  Telephone follow up appointment with pharmacy team member scheduled for: 6 months  Charlene Brooke, PharmD, BCACP Clinical Pharmacist [facility] [phone]  Overactive Bladder, Adult  Overactive bladder refers to a condition in which a person has a sudden need to pass urine. The person may leak urine if he or she cannot get to the bathroom fast enough (urinary incontinence). A person with this condition may also wake up several times in the night to go to the bathroom. Overactive bladder is associated with poor nerve signals between your bladder and your brain. Your bladder may get the signal to empty before it is full. You may also have very sensitive muscles that make your bladder squeeze too soon. These symptoms might interfere with  daily work or social activities. What are the causes? This condition may be associated with or caused by:  Urinary tract infection.  Infection of nearby tissues, such as the prostate.  Prostate enlargement.  Surgery on the uterus or urethra.  Bladder stones, inflammation, or tumors.  Drinking too much caffeine or alcohol.  Certain medicines, especially medicines that get rid of extra fluid in the body (diuretics).  Muscle or nerve weakness, especially from: ? A spinal cord injury. ? Stroke. ? Multiple sclerosis. ? Parkinson's disease.  Diabetes.  Constipation. What increases the risk? You may be at greater risk for overactive bladder if you:  Are an older adult.  Smoke.  Are going through menopause.  Have prostate problems.  Have a neurological disease, such as stroke, dementia, Parkinson's disease, or multiple sclerosis (MS).  Eat or drink things that irritate the bladder. These include alcohol, spicy food, and caffeine.  Are overweight or obese. What are the signs or symptoms? Symptoms of this condition include:  Sudden, strong urge to urinate.  Leaking urine.  Urinating 8 or more times a day.  Waking up to urinate 2 or more times a night. How is this diagnosed? Your health care provider may suspect overactive bladder based on your symptoms. He or she will diagnose this condition by:  A physical exam and medical history.  Blood or  urine tests. You might need bladder or urine tests to help determine what is causing your overactive bladder. You might also need to see a health care provider who specializes in urinary tract problems (urologist). How is this treated? Treatment for overactive bladder depends on the cause of your condition and whether it is mild or severe. You can also make lifestyle changes at home. Options include:  Bladder training. This may include: ? Learning to control the urge to urinate by following a schedule that directs you to  urinate at regular intervals (timed voiding). ? Doing Kegel exercises to strengthen your pelvic floor muscles, which support your bladder. Toning these muscles can help you control urination, even if your bladder muscles are overactive.  Special devices. This may include: ? Biofeedback, which uses sensors to help you become aware of your body's signals. ? Electrical stimulation, which uses electrodes placed inside the body (implanted) or outside the body. These electrodes send gentle pulses of electricity to strengthen the nerves or muscles that control the bladder. ? Women may use a plastic device that fits into the vagina and supports the bladder (pessary).  Medicines. ? Antibiotics to treat bladder infection. ? Antispasmodics to stop the bladder from releasing urine at the wrong time. ? Tricyclic antidepressants to relax bladder muscles. ? Injections of botulinum toxin type A directly into the bladder tissue to relax bladder muscles.  Lifestyle changes. This may include: ? Weight loss. Talk to your health care provider about weight loss methods that would work best for you. ? Diet changes. This may include reducing how much alcohol and caffeine you consume, or drinking fluids at different times of the day. ? Not smoking. Do not use any products that contain nicotine or tobacco, such as cigarettes and e-cigarettes. If you need help quitting, ask your health care provider.  Surgery. ? A device may be implanted to help manage the nerve signals that control urination. ? An electrode may be implanted to stimulate electrical signals in the bladder. ? A procedure may be done to change the shape of the bladder. This is done only in very severe cases. Follow these instructions at home: Lifestyle  Make any diet or lifestyle changes that are recommended by your health care provider. These may include: ? Drinking less fluid or drinking fluids at different times of the day. ? Cutting down on  caffeine or alcohol. ? Doing Kegel exercises. ? Losing weight if needed. ? Eating a healthy and balanced diet to prevent constipation. This may include:  Eating foods that are high in fiber, such as fresh fruits and vegetables, whole grains, and beans.  Limiting foods that are high in fat and processed sugars, such as fried and sweet foods. General instructions  Take over-the-counter and prescription medicines only as told by your health care provider.  If you were prescribed an antibiotic medicine, take it as told by your health care provider. Do not stop taking the antibiotic even if you start to feel better.  Use any implants or pessary as told by your health care provider.  If needed, wear pads to absorb urine leakage.  Keep a journal or log to track how much and when you drink and when you feel the need to urinate. This will help your health care provider monitor your condition.  Keep all follow-up visits as told by your health care provider. This is important. Contact a health care provider if:  You have a fever.  Your symptoms do not get better  with treatment.  Your pain and discomfort get worse.  You have more frequent urges to urinate. Get help right away if:  You are not able to control your bladder. Summary  Overactive bladder refers to a condition in which a person has a sudden need to pass urine.  Several conditions may lead to an overactive bladder.  Treatment for overactive bladder depends on the cause and severity of your condition.  Follow your health care provider's instructions about lifestyle changes, doing Kegel exercises, keeping a journal, and taking medicines. This information is not intended to replace advice given to you by your health care provider. Make sure you discuss any questions you have with your health care provider. Document Revised: 12/13/2018 Document Reviewed: 09/07/2017 Elsevier Patient Education  [place].

## 2020-06-09 NOTE — Progress Notes (Signed)
Chronic Care Management Pharmacy Assistant   Name: Monique [name]  MRN: [id number] DOB: 06-14-1936  Reason for Encounter: Initial Questions     Monique [name],  84 y.o. , female presents for their Initial CCM visit with the clinical pharmacist via telephone.  PCP : Hoyt Koch, MD  Allergies:  No Known Allergies  Medications: Outpatient Encounter Medications as of 06/09/2020  Medication Sig   acetaminophen (TYLENOL) 500 MG tablet Take 1,000 mg by mouth.   alum & mag hydroxide-simeth (MAALOX/MYLANTA) 200-200-20 MG/5ML suspension Take 15 mLs by mouth every 4 (four) hours as needed for indigestion or heartburn.   amLODipine (NORVASC) 5 MG tablet TAKE 1 TABLET BY MOUTH 2 TIMES A DAY   atorvastatin (LIPITOR) 20 MG tablet Take 1 tablet (20 mg total) by mouth daily at 6 PM.   disopyramide (NORPACE) 150 MG capsule TAKE 1 CAPSULE (150 MG TOTAL) BY MOUTH 2 (TWO) TIMES DAILY.   lansoprazole (PREVACID) 30 MG capsule Take 1 capsule (30 mg total) by mouth daily at 12 noon.   lidocaine (LIDODERM) 5 % Place 1 patch onto the skin daily.   metFORMIN (GLUCOPHAGE) 500 MG tablet TAKE 1 TABLET (500 MG TOTAL) BY MOUTH DAILY WITH BREAKFAST.   metoprolol tartrate (LOPRESSOR) 25 MG tablet TAKE 1 TABLET BY MOUTH 2 TIMES A DAY   metoprolol tartrate (LOPRESSOR) 50 MG tablet Take 1 tablet (50 mg total) by mouth 2 (two) times daily.   mirtazapine (REMERON) 15 MG tablet TAKE 1 TABLET BY MOUTH EVERYDAY AT BEDTIME   nitrofurantoin, macrocrystal-monohydrate, (MACROBID) 100 MG capsule Take 1 capsule (100 mg total) by mouth 2 (two) times daily.   nitroGLYCERIN (NITROSTAT) 0.4 MG SL tablet PLACE 1 TABLET (0.4 MG TOTAL) UNDER THE TONGUE EVERY 5 (FIVE) MINUTES AS NEEDED FOR CHEST PAIN.   pantoprazole (PROTONIX) 40 MG tablet TAKE 1 TABLET BY MOUTH EVERY DAY   SUMAtriptan (IMITREX) 50 MG tablet TAKE 1 TABLET BY MOUTH EVERY DAY AS NEEDED. MAY REPEAT IN 2 HOURS IF HEADACHE PERSISTS OR RECURS.    XARELTO 15 MG TABS tablet TAKE 1 TABLET BY MOUTH EVERY DAY WITH SUPPER   No facility-administered encounter medications on file as of 06/09/2020.    Current Diagnosis: Patient Active Problem List   Diagnosis Date Noted   Muscle spasm 01/02/2020   Thoracic aortic aneurysm without rupture ([place]) 12/11/2019   PSVT (paroxysmal supraventricular tachycardia) ([place]) 08/22/2019   Chest pain 08/21/2019   Blood in stool 01/04/2018   Diabetes mellitus type 2, controlled, without complications ([place]) 26/37/8588   Allergic rhinitis 06/20/2017   Headache 10/23/2015   Chronic RLQ pain 04/24/2015   UTI (urinary tract infection) 01/04/2015   Chest tightness 11/28/2014   Chronic kidney disease, stage III (moderate) ([place]) 11/21/2014   Pulmonary embolism, bilateral ([place]) 06/27/2014   Cerebral aneurysm 2000 06/27/2014   Hyperlipidemia associated with type 2 diabetes mellitus ([place]) 05/20/2008   Essential hypertension 05/20/2008   Hypertrophic obstructive cardiomyopathy (HOCM) ([place]) 05/20/2008   PAROXYSMAL ATRIAL FIBRILLATION 05/20/2008   Subarachnoid hemorrhage 2000 05/20/2008    Goals Addressed   None     Follow-Up:  Pharmacist Review  Have you seen any other providers since your last visit? no Any changes in your medications or health? no Any side effects from any medications? no Do you have an symptoms or problems not managed by your medications? Yes, The patient is taking Tylenol 500mg  and Sumatriptan 50mg  for her headaches neither seems to help her.  Any concerns about  your health right now? Yes, She would like to find something to relive her headaches.  Has your provider asked that you check blood pressure, blood sugar, or follow special diet at home? no Do you get any type of exercise on a regular basis? Yes, The patient walks as much as she is able.  Can you think of a goal you would like to reach for your health? None  Do you have any problems getting your medications?  no Is there anything that you would like to discuss during the appointment? No  Please bring medications and supplements to appointment   Rosendo Gros, [facility]  Practice Team Manager/ CPA (Clinical Pharmacist Assistant) [phone]

## 2020-06-10 ENCOUNTER — Other Ambulatory Visit: Payer: Self-pay

## 2020-06-10 DIAGNOSIS — I10 Essential (primary) hypertension: Secondary | ICD-10-CM

## 2020-06-16 ENCOUNTER — Telehealth: Payer: Self-pay | Admitting: Internal Medicine

## 2020-06-16 NOTE — Telephone Encounter (Signed)
LVM for pt to rtn my call to schedule AWV-I with [facility] 

## 2020-06-19 ENCOUNTER — Other Ambulatory Visit: Payer: Self-pay

## 2020-06-19 ENCOUNTER — Ambulatory Visit (INDEPENDENT_AMBULATORY_CARE_PROVIDER_SITE_OTHER): Payer: Medicare Other | Admitting: Family

## 2020-06-19 VITALS — BP 116/78 | HR 58 | Temp 98.0°F | Ht 66.0 in | Wt 134.0 lb

## 2020-06-19 DIAGNOSIS — R519 Headache, unspecified: Secondary | ICD-10-CM | POA: Diagnosis not present

## 2020-06-19 DIAGNOSIS — N3281 Overactive bladder: Secondary | ICD-10-CM

## 2020-06-19 DIAGNOSIS — G8929 Other chronic pain: Secondary | ICD-10-CM | POA: Diagnosis not present

## 2020-06-19 DIAGNOSIS — Z23 Encounter for immunization: Secondary | ICD-10-CM

## 2020-06-19 NOTE — Progress Notes (Signed)
Monique [name] is a 84 y.o. female with the following history as recorded in EpicCare:  Patient Active Problem List   Diagnosis Date Noted  . Muscle spasm 01/02/2020  . Thoracic aortic aneurysm without rupture ([place]) 12/11/2019  . PSVT (paroxysmal supraventricular tachycardia) ([place]) 08/22/2019  . Chest pain 08/21/2019  . Blood in stool 01/04/2018  . Diabetes mellitus type 2, controlled, without complications ([place]) 38/06/1750  . Allergic rhinitis 06/20/2017  . Headache 10/23/2015  . Chronic RLQ pain 04/24/2015  . UTI (urinary tract infection) 01/04/2015  . Chest tightness 11/28/2014  . Chronic kidney disease, stage III (moderate) ([place]) 11/21/2014  . Pulmonary embolism, bilateral ([place]) 06/27/2014  . Cerebral aneurysm 2000 06/27/2014  . Hyperlipidemia associated with type 2 diabetes mellitus ([place]) 05/20/2008  . Essential hypertension 05/20/2008  . Hypertrophic obstructive cardiomyopathy (HOCM) ([place]) 05/20/2008  . PAROXYSMAL ATRIAL FIBRILLATION 05/20/2008  . Subarachnoid hemorrhage 2000 05/20/2008    Current Outpatient Medications  Medication Sig Dispense Refill  . acetaminophen (TYLENOL) 500 MG tablet Take 1,000 mg by mouth.    Marland Kitchen alum & mag hydroxide-simeth (MAALOX/MYLANTA) 200-200-20 MG/5ML suspension Take 15 mLs by mouth every 4 (four) hours as needed for indigestion or heartburn. 355 mL 0  . amLODipine (NORVASC) 5 MG tablet TAKE 1 TABLET BY MOUTH 2 TIMES A DAY 180 tablet 3  . atorvastatin (LIPITOR) 20 MG tablet Take 1 tablet (20 mg total) by mouth daily at 6 PM. 90 tablet 1  . disopyramide (NORPACE) 150 MG capsule TAKE 1 CAPSULE (150 MG TOTAL) BY MOUTH 2 (TWO) TIMES DAILY. 180 capsule 3  . lansoprazole (PREVACID) 30 MG capsule Take 1 capsule (30 mg total) by mouth daily at 12 noon. 90 capsule 3  . lidocaine (LIDODERM) 5 % Place 1 patch onto the skin daily. 30 patch 3  . metFORMIN (GLUCOPHAGE) 500 MG tablet TAKE 1 TABLET (500 MG TOTAL) BY MOUTH DAILY WITH BREAKFAST. 90 tablet 1  .  metoprolol tartrate (LOPRESSOR) 25 MG tablet TAKE 1 TABLET BY MOUTH 2 TIMES A DAY 180 tablet 0  . metoprolol tartrate (LOPRESSOR) 50 MG tablet Take 1 tablet (50 mg total) by mouth 2 (two) times daily. 180 tablet 3  . mirtazapine (REMERON) 15 MG tablet TAKE 1 TABLET BY MOUTH EVERYDAY AT BEDTIME 90 tablet 1  . nitroGLYCERIN (NITROSTAT) 0.4 MG SL tablet PLACE 1 TABLET (0.4 MG TOTAL) UNDER THE TONGUE EVERY 5 (FIVE) MINUTES AS NEEDED FOR CHEST PAIN. 75 tablet 1  . pantoprazole (PROTONIX) 40 MG tablet TAKE 1 TABLET BY MOUTH EVERY DAY 90 tablet 1  . SUMAtriptan (IMITREX) 50 MG tablet TAKE 1 TABLET BY MOUTH EVERY DAY AS NEEDED. MAY REPEAT IN 2 HOURS IF HEADACHE PERSISTS OR RECURS. 10 tablet 0  . XARELTO 15 MG TABS tablet TAKE 1 TABLET BY MOUTH EVERY DAY WITH SUPPER 90 tablet 2   No current facility-administered medications for this visit.    Allergies: Patient has no known allergies.  Past Medical History:  Diagnosis Date  . Abdominal pain 11/18/2015  . Acute respiratory failure with hypoxia ([place]) 06/27/2014  . Breast pain, left 10/11/2016  . Cerebral aneurysm 2000 06/27/2014  . Chest pain 10/30/2014  . CHF (congestive heart failure) ([place])   . Chronic anticoagulation 06/2014   Coumadin started after bilateral PE  . Chronic kidney disease, stage III (moderate) 11/21/2014  . Clotting disorder ([place])    right was removed, still have left cataract  . Diabetes ([place])   . Dizziness and giddiness 11/28/2014  .  DVT of lower extremity (deep venous thrombosis) ([place]) 11/28/2014  . Dysuria 07/07/2014  . Elevated troponin 11/28/2014  . Fatty liver   . Gastritis 05/16/2012   Chronic dyspepsia on PPI daily.   Marland Kitchen Headache, acute 06/08/2010   Qualifier: Diagnosis of  By: Linda Hedges MD, Heinz Knuckles   . Heart murmur   . Hematochezia 11/18/2015  . History of fibromuscular dysplasia 06/27/2014  . HTN (hypertension)    Essential  . Hyperlipidemia   . Hypertrophic obstructive cardiomyopathy (HOCM) ([place]) 05/20/2008   Qualifier:  Diagnosis of  By: Linda Hedges MD, Heinz Knuckles   . Kidney stones   . LVH (left ventricular hypertrophy)   . Orthostatic hypotension 11/28/2014  . PAF (paroxysmal atrial fibrillation) ([place]) 2000  . Palpitations 11/28/2014  . PAROXYSMAL ATRIAL FIBRILLATION 05/20/2008   Qualifier: Diagnosis of  By: Linda Hedges MD, Heinz Knuckles   . PE (pulmonary embolism) 06/2014   Bilateral  . Postmenopausal osteoporosis 04/2019   T score -3.3  . Pulmonary embolism, bilateral ([place]) 06/27/2014  . SOB (shortness of breath) 06/27/2014  . Subarachnoid hemorrhage ([place]) 2000  . UTI (urinary tract infection) 01/04/2015    Past Surgical History:  Procedure Laterality Date  . ABDOMINAL HYSTERECTOMY  2000  . Brain hemorrage     2000  . CARDIAC CATHETERIZATION  05/2008   Nl cors, no gradient, EF 60%    Family History  Problem Relation Age of Onset  . Breast cancer Mother 85       Deceased  . Stroke Mother   . Arrhythmia Mother   . Heart disease Mother   . Arrhythmia Sister   . Breast cancer Daughter 72  . Colon cancer Neg Hx   . Esophageal cancer Neg Hx   . Pancreatic cancer Neg Hx   . Rectal cancer Neg Hx   . Stomach cancer Neg Hx     Social History   Tobacco Use  . Smoking status: Former Smoker    Quit date: 09/06/1967    Years since quitting: 52.8  . Smokeless tobacco: Never Used  Substance Use Topics  . Alcohol use: No    Alcohol/week: 0.0 standard drinks    Subjective:  Chronic left sided head pain- notes pain is intermittent; normal CT earlier this year; is now requesting to see neurology; Also notes that having increased difficulty with night-time urination; would be open to seeing a specialist;      Objective:  Vitals:   06/19/20 1111  BP: 116/78  Pulse: (!) 58  Temp: 98 F (36.7 C)  TempSrc: Oral  SpO2: 97%  Weight: 134 lb (60.8 kg)  Height: 5\' 6"  (1.676 m)    General: Well developed, well nourished, in no acute distress  Head: Normocephalic and atraumatic  Lungs: Respirations unlabored;  clear to auscultation bilaterally without wheeze, rales, rhonchi  [facility] exam: normal rate and regular rhythm.  Neurologic: Alert and oriented; speech intact; face symmetrical; moves all extremities well; CNII-XII intact without focal deficit   Assessment:  1. Chronic left-sided headaches   2. Flu vaccine need   3. Overactive bladder     Plan:  1. Refer to neurology per patient request; 2. Flu vaccine given; 3. Refer to urogynecology per patient request;  This visit occurred during the SARS-CoV-2 public health emergency.  Safety protocols were in place, including screening questions prior to the visit, additional usage of staff PPE, and extensive cleaning of exam room while observing appropriate contact time as indicated for disinfecting solutions.  No follow-ups on file.  Orders Placed This Encounter  Procedures  . Flu Vaccine QUAD High Dose(Fluad)  . Ambulatory referral to Neurology    Referral Priority:   Routine    Referral Type:   Consultation    Referral Reason:   Specialty Services Required    Requested Specialty:   Neurology    Number of Visits Requested:   1  . Ambulatory referral to Urogynecology    Referral Priority:   Routine    Referral Type:   Consultation    Referral Reason:   Specialty Services Required    Requested Specialty:   Urology    Number of Visits Requested:   1    Requested Prescriptions    No prescriptions requested or ordered in this encounter

## 2020-07-01 ENCOUNTER — Other Ambulatory Visit: Payer: Self-pay

## 2020-07-01 ENCOUNTER — Encounter: Payer: Self-pay | Admitting: Obstetrics and Gynecology

## 2020-07-01 ENCOUNTER — Ambulatory Visit (INDEPENDENT_AMBULATORY_CARE_PROVIDER_SITE_OTHER): Payer: Medicare Other | Admitting: Obstetrics and Gynecology

## 2020-07-01 VITALS — BP 145/80 | Wt 130.0 lb

## 2020-07-01 DIAGNOSIS — K5900 Constipation, unspecified: Secondary | ICD-10-CM | POA: Diagnosis not present

## 2020-07-01 DIAGNOSIS — N3281 Overactive bladder: Secondary | ICD-10-CM | POA: Diagnosis not present

## 2020-07-01 DIAGNOSIS — R351 Nocturia: Secondary | ICD-10-CM

## 2020-07-01 MED ORDER — MIRABEGRON ER 25 MG PO TB24: 25.0000 mg | ORAL_TABLET | Freq: Every day | ORAL | 5 refills | Status: DC

## 2020-07-01 NOTE — Patient Instructions (Addendum)
We will start Myrbetriq 25mg , take at night before bed.   Increase amount of water to 12ml a day but do not drink at least 2 hours before bed.    Women should try to eat at least 21 to 25 grams of fiber a day, while men should aim for 30 to 38 grams a day. You can add fiber to your diet with food or a fiber supplement such as psyllium (metamucil), benefiber, citrucel, or fibercon. You can take this with miralax.   Here's a look at how much dietary fiber is found in some common foods. When buying packaged foods, check the Nutrition Facts label for fiber content. It can vary among brands.  Fruits Serving size Total fiber (grams)*  Raspberries 1 cup 8.0  Pear 1 medium 5.5  Apple, with skin 1 medium 4.5  Banana 1 medium 3.0  Orange 1 medium 3.0  Strawberries 1 cup 3.0   Vegetables Serving size Total fiber (grams)*  Green peas, boiled 1 cup 9.0  Broccoli, boiled 1 cup chopped 5.0  Turnip greens, boiled 1 cup 5.0  Brussels sprouts, boiled 1 cup 4.0  Potato, with skin, baked 1 medium 4.0  Sweet corn, boiled 1 cup 3.5  Cauliflower, raw 1 cup chopped 2.0  Carrot, raw 1 medium 1.5   Grains Serving size Total fiber (grams)*  Spaghetti, whole-wheat, cooked 1 cup 6.0  Barley, pearled, cooked 1 cup 6.0  Bran flakes 3/4 cup 5.5  Quinoa, cooked 1 cup 5.0  Oat bran muffin 1 medium 5.0  Oatmeal, instant, cooked 1 cup 5.0  Popcorn, air-popped 3 cups 3.5  Brown rice, cooked 1 cup 3.5  Bread, whole-wheat 1 slice 2.0  Bread, rye 1 slice 2.0   Legumes, nuts and seeds Serving size Total fiber (grams)*  Split peas, boiled 1 cup 16.0  Lentils, boiled 1 cup 15.5  Black beans, boiled 1 cup 15.0  Baked beans, canned 1 cup 10.0  Chia seeds 1 ounce 10.0  Almonds 1 ounce (23 nuts) 3.5  Pistachios 1 ounce (49 nuts) 3.0  Sunflower kernels 1 ounce 3.0  *Rounded to nearest 0.5 gram. Source: [employer] for [employer], [employer]

## 2020-07-01 NOTE — Progress Notes (Signed)
[facility] New Patient Evaluation and Consultation  Referring Provider: Marrian Salvage,* PCP: Hoyt Koch, MD Date of Service: 07/01/2020  SUBJECTIVE Chief Complaint: New Patient (Initial Visit) (Dr Valere Dross referred Overactive bladder)  History of Present Illness: Monique [name] is a 84 y.o. Black or African-American female seen in consultation at the request of NP [facility] for evaluation of overactive bladder.    Review of records significant for: Pt reports increased nighttime urination. She has DM with last Hg A1c 6.7.   Urinary Symptoms: Does not leak urine.   Day time voids 3.  Nocturia: 8 times per night to void. Voiding dysfunction: she empties her bladder well.  does not use a catheter to empty bladder.  When urinating, she feels difficulty starting urine stream and the need to urinate multiple times in a row Drinks: 1.5 16oz bottles water, occasional tea per day Does not drink before bed, occasionally will have a sip of water with meds (takes norvasc and metoprolol).  UTIs: 0 UTI's in the last year.   Reports history of kidney or bladder stones  Pelvic Organ Prolapse Symptoms:                  She Denies a feeling of a bulge the vaginal area.  Bowel Symptom: Bowel movements: 1 time(s) per day Stool consistency: hard Straining: yes.  Splinting: yes.  Incomplete evacuation: no.  She Denies accidental bowel leakage / fecal incontinence Bowel regimen: miralax, most days Last colonoscopy: She thinks 2 years ago.   Sexual Function Sexually active: no.   Pelvic Pain Denies pelvic pain   Past Medical History:  Past Medical History:  Diagnosis Date  . Abdominal pain 11/18/2015  . Acute respiratory failure with hypoxia ([place]) 06/27/2014  . Breast pain, left 10/11/2016  . Cerebral aneurysm 2000 06/27/2014  . Chest pain 10/30/2014  . CHF (congestive heart failure) ([place])   . Chronic anticoagulation 06/2014   Coumadin started after  bilateral PE  . Chronic kidney disease, stage III (moderate) ([place]) 11/21/2014  . Clotting disorder ([place])    right was removed, still have left cataract  . Diabetes ([place])   . Dizziness and giddiness 11/28/2014  . DVT of lower extremity (deep venous thrombosis) ([place]) 11/28/2014  . Dysuria 07/07/2014  . Elevated troponin 11/28/2014  . Fatty liver   . Gastritis 05/16/2012   Chronic dyspepsia on PPI daily.   Marland Kitchen Headache, acute 06/08/2010   Qualifier: Diagnosis of  By: Linda Hedges MD, Heinz Knuckles   . Heart murmur   . Hematochezia 11/18/2015  . History of fibromuscular dysplasia 06/27/2014  . HTN (hypertension)    Essential  . Hyperlipidemia   . Hypertrophic obstructive cardiomyopathy (HOCM) ([place]) 05/20/2008   Qualifier: Diagnosis of  By: Linda Hedges MD, Heinz Knuckles   . Kidney stones   . LVH (left ventricular hypertrophy)   . Orthostatic hypotension 11/28/2014  . PAF (paroxysmal atrial fibrillation) ([place]) 2000  . Palpitations 11/28/2014  . PAROXYSMAL ATRIAL FIBRILLATION 05/20/2008   Qualifier: Diagnosis of  By: Linda Hedges MD, Heinz Knuckles   . PE (pulmonary embolism) 06/2014   Bilateral  . Postmenopausal osteoporosis 04/2019   T score -3.3  . Pulmonary embolism, bilateral ([place]) 06/27/2014  . SOB (shortness of breath) 06/27/2014  . Subarachnoid hemorrhage ([place]) 2000  . UTI (urinary tract infection) 01/04/2015     Past Surgical History:   Past Surgical History:  Procedure Laterality Date  . ABDOMINAL HYSTERECTOMY  2000  . Brain hemorrage  2000  . CARDIAC CATHETERIZATION  05/2008   Nl cors, no gradient, EF 60%     Past OB/GYN History: G4 P4 Vaginal deliveries: 4,  Forceps/ Vacuum deliveries: 0, Cesarean section: 0 Menopausal: Yes,  Denies vaginal bleeding since menopause Contraception: n/a. Last pap smear was prior to hyst.  Any history of abnormal pap smears: no.   Medications: She has a current medication list which includes the following prescription(s): acetaminophen, alum & mag hydroxide-simeth,  amlodipine, atorvastatin, disopyramide, lansoprazole, lidocaine, metformin, metoprolol tartrate, metoprolol tartrate, mirtazapine, nitroglycerin, pantoprazole, sumatriptan, xarelto, and mirabegron er.   Allergies: Patient has No Known Allergies.   Social History:  Social History   Tobacco Use  . Smoking status: Former Smoker    Quit date: 09/06/1967    Years since quitting: 52.8  . Smokeless tobacco: Never Used  Vaping Use  . Vaping Use: Never used  Substance Use Topics  . Alcohol use: No    Alcohol/week: 0.0 standard drinks  . Drug use: No    Relationship status: single She lives alone.   She is not employed. Regular exercise: No History of abuse: No  Family History:   Family History  Problem Relation Age of Onset  . Breast cancer Mother 43       Deceased  . Stroke Mother   . Arrhythmia Mother   . Heart disease Mother   . Arrhythmia Sister   . Breast cancer Daughter 60  . Colon cancer Neg Hx   . Esophageal cancer Neg Hx   . Pancreatic cancer Neg Hx   . Rectal cancer Neg Hx   . Stomach cancer Neg Hx      Review of Systems: Review of Systems  Constitutional: Positive for weight loss. Negative for fever and malaise/fatigue.  Respiratory: Negative for cough, shortness of breath and wheezing.   Cardiovascular: Positive for palpitations. Negative for chest pain.  Gastrointestinal: Negative for abdominal pain and blood in stool.  Musculoskeletal: Negative for myalgias.  Skin: Negative for rash.  Neurological: Positive for headaches. Negative for dizziness.  Endo/Heme/Allergies: Does not bruise/bleed easily.  Psychiatric/Behavioral: Negative for depression. The patient is not nervous/anxious.      OBJECTIVE Physical Exam: Vitals:   07/01/20 1528  BP: (!) 145/80  Weight: 130 lb (59 kg)    Physical Exam Constitutional:      General: She is not in acute distress. Pulmonary:     Effort: Pulmonary effort is normal.  Abdominal:     General: There is no  distension.     Palpations: Abdomen is soft.     Tenderness: There is no abdominal tenderness. There is no rebound.  Musculoskeletal:        General: No swelling. Normal range of motion.  Skin:    General: Skin is warm and dry.     Findings: No rash.  Neurological:     Mental Status: She is alert and oriented to person, place, and time.  Psychiatric:        Mood and Affect: Mood normal.        Behavior: Behavior normal.      GU / Detailed Urogynecologic Evaluation:  Pelvic Exam: Normal external female genitalia; Bartholin's and Skene's glands normal in appearance; urethral meatus normal in appearance, no urethral masses or discharge.   CST: negative  s/p hysterectomy: Speculum exam reveals normal vaginal mucosa with  atrophy and normal vaginal cuff.  Adnexa no mass, fullness, tenderness.    Pelvic floor musculature: Right levator non-tender, Right obturator non-tender,  Left levator non-tender, Left obturator non-tender  POP-Q:   POP-Q  -3                                            Aa   -3                                           Ba  -8                                              C   2                                            Gh  3                                            Pb  8                                            tvl   -2                                            Ap  -2                                            Bp   (n/a)                                              D     Rectal Exam:  deferred  Post-Void Residual (PVR) by Bladder Scan: In order to evaluate bladder emptying, we discussed obtaining a postvoid residual and she agreed to this procedure.  She was unable to provide a urine sample.   Procedure: The ultrasound unit was placed on the patient's abdomen in the suprapubic region after the patient had voided. A PVR of 133 ml was obtained by bladder scan.   ASSESSMENT AND PLAN Monique [name] is a 84 y.o. with:  1. Nocturia   2.  Constipation, unspecified constipation type     1. Nocturia - We discussed the symptoms of overactive bladder (OAB), which include urinary urgency, urinary frequency, nocturia, with or without urge incontinence.  While we do not know the exact etiology of OAB, several treatment options exist. We discussed management including behavioral therapy (decreasing bladder irritants, urge suppression strategies, timed voids, bladder retraining), physical therapy, medication; and third line therapies for refractory cases.  For Beta-3 agonist  medication, we discussed the potential side effect of elevated blood pressure which is more likely to occur in individuals with uncontrolled hypertension. Her BP is well controlled today.  - Will start Myrbetriq 25mg  daily. Advised to take at night.  - She was unable to provide a urine sample today. Advised her that she should drink around 71ml of water a day, she is currently drinking closer to 20. She should also avoid drinking at least 2 hours before bedtime.   2. Constipation - For constipation, we reviewed the importance of a better bowel regimen.  Constipation can also exacerbate OAB symptoms.  -  We discussed initiating therapy with increasing fluid intake, fiber supplementation, stool softeners, and laxatives such as miralax. She currently takes miralax and will work to add in fiber supplementation. List of fiber supplements and fiber rich foods provided.   Return 6 weeks for follow up.   Jaquita Folds, MD    Medical Decision Making:   - Review and summation of prior records

## 2020-07-06 DIAGNOSIS — N644 Mastodynia: Secondary | ICD-10-CM | POA: Diagnosis not present

## 2020-07-06 DIAGNOSIS — N6082 Other benign mammary dysplasias of left breast: Secondary | ICD-10-CM | POA: Diagnosis not present

## 2020-07-06 DIAGNOSIS — R921 Mammographic calcification found on diagnostic imaging of breast: Secondary | ICD-10-CM | POA: Diagnosis not present

## 2020-07-10 ENCOUNTER — Other Ambulatory Visit: Payer: Self-pay | Admitting: Internal Medicine

## 2020-07-10 NOTE — Telephone Encounter (Signed)
Prescription refill request for Xarelto received.   Last office visit: Monique [name], 03/13/2020 Weight: 59 kg  Age: 84 yo Scr: 1.58, 04/17/2020 CrCl: 25 ml/min   Prescription refill sent.

## 2020-07-26 NOTE — Progress Notes (Unsigned)
{Choose 1 Note Type (Video or Telephone):[phone]}    Date:  07/26/2020   ID:  Monique [name], DOB 1936/05/11, MRN [id number] The patient was identified using 2 identifiers.  {Patient [phone]::"Home"} {Provider [phone]::"Home Office"}  PCP:  Hoyt Koch, MD  Cardiologist:  Dorris Carnes, MD *** Electrophysiologist:  None   Evaluation Performed:  {Choose Visit NWGN:[id number]::"QIONGE-XB Visit"}  Chief Complaint:  ***  Patient Profile: Monique [name] is a 84 y.o. female with:  Hypertrophic obstructive cardiomyopathy ? Echocardiogram 1/19: Gradient 34 mmHg ? Echocardiogram 09/2019: no asymmetric hypertrophy or SAM, gradient 5 mmHg  Hypertension  Paroxysmal atrial fibrillation  CHA2DS2-VASc=4 (HTN, age x 2, female) >> Rivaroxaban  CrCl (12/2019): 35 >> Rivaroxaban 15 mg   History of SVT  Thoracic aortic aneurysm ? CT 12/2019: 4 cm  History of DVT, bilateral pulmonary embolism  Chronic kidney disease stage III  Cerebral aneurysm, history of subarachnoid hemorrhage  Hyperlipidemia  Cardiac catheterization 2009: No CAD  Myoview 1/19: Normal perfusion   Myoview 08/2019: Low risk  Prior CV Studies: Chest CTA 12/05/2019 Ascending thoracic aortic aneurysm 4 cm, mild emphysema, aortic atherosclerosis  Echocardiogram 09/16/2019 EF 65-70, mild LVH, GRII DD, no RWMA, intracavitary gradient 5 mmHg, normal RV SF, mild LAE, trivial MR, RVSP 45.9 mmHg, no obvious asymmetric hypertrophy or SAM (prior intracavitary gradient 34 mmHg; 5 mmHg on this study)  Myoview 09/04/2019 EF 65, normal perfusion; Low Risk  Myoview 09/08/2017 EF 64, normal perfusion; low risk  Echocardiogram 09/09/2017 Moderate LVH, EF 65-70, intracavitary gradient 34 mmHg, asymmetric LVH, mild SAM, GR 1 DD, mean aortic valve gradient 21 mmHg  Cardiac catheterization 05/11/2008 No CAD EF 60; no gradient on pullback from mid LV to the LVOT to the aorta-no evidence for  LVOT gradient   History of Present Illness:   Monique [name] was last seen by Dr. Harrington Challenger in 7/21.  She is seen for f/u.  ***  Past Medical History:  Diagnosis Date  . Abdominal pain 11/18/2015  . Acute respiratory failure with hypoxia ([place]) 06/27/2014  . Breast pain, left 10/11/2016  . Cerebral aneurysm 2000 06/27/2014  . Chest pain 10/30/2014  . CHF (congestive heart failure) ([place])   . Chronic anticoagulation 06/2014   Coumadin started after bilateral PE  . Chronic kidney disease, stage III (moderate) ([place]) 11/21/2014  . Clotting disorder ([place])    right was removed, still have left cataract  . Diabetes ([place])   . Dizziness and giddiness 11/28/2014  . DVT of lower extremity (deep venous thrombosis) ([place]) 11/28/2014  . Dysuria 07/07/2014  . Elevated troponin 11/28/2014  . Fatty liver   . Gastritis 05/16/2012   Chronic dyspepsia on PPI daily.   Marland Kitchen Headache, acute 06/08/2010   Qualifier: Diagnosis of  By: Linda Hedges MD, Heinz Knuckles   . Heart murmur   . Hematochezia 11/18/2015  . History of fibromuscular dysplasia 06/27/2014  . HTN (hypertension)    Essential  . Hyperlipidemia   . Hypertrophic obstructive cardiomyopathy (HOCM) ([place]) 05/20/2008   Qualifier: Diagnosis of  By: Linda Hedges MD, Heinz Knuckles   . Kidney stones   . LVH (left ventricular hypertrophy)   . Orthostatic hypotension 11/28/2014  . PAF (paroxysmal atrial fibrillation) ([place]) 2000  . Palpitations 11/28/2014  . PAROXYSMAL ATRIAL FIBRILLATION 05/20/2008   Qualifier: Diagnosis of  By: Linda Hedges MD, Heinz Knuckles   . PE (pulmonary embolism) 06/2014   Bilateral  . Postmenopausal osteoporosis 04/2019   T score -3.3  . Pulmonary embolism, bilateral ([place]) 06/27/2014  .  SOB (shortness of breath) 06/27/2014  . Subarachnoid hemorrhage ([place]) 2000  . UTI (urinary tract infection) 01/04/2015   Past Surgical History:  Procedure Laterality Date  . ABDOMINAL HYSTERECTOMY  2000  . Brain hemorrage     2000  . CARDIAC CATHETERIZATION  05/2008   Nl cors, no  gradient, EF 60%     No outpatient medications have been marked as taking for the 07/27/20 encounter (Appointment) with Richardson Dopp T, PA-C.     Allergies:   Patient has no known allergies.   Social History   Tobacco Use  . Smoking status: Former Smoker    Quit date: 09/06/1967    Years since quitting: 52.9  . Smokeless tobacco: Never Used  Vaping Use  . Vaping Use: Never used  Substance Use Topics  . Alcohol use: No    Alcohol/week: 0.0 standard drinks  . Drug use: No     Family Hx: The patient's family history includes Arrhythmia in her mother and sister; Breast cancer (age of onset: 88) in her daughter; Breast cancer (age of onset: 26) in her mother; Heart disease in her mother; Stroke in her mother. There is no history of Colon cancer, Esophageal cancer, Pancreatic cancer, Rectal cancer, or Stomach cancer.  ROS:   Please see the history of present illness.    *** All other systems reviewed and are negative.   Prior CV studies:   The following studies were reviewed today:  ***  Labs/Other Tests and Data Reviewed:    EKG:  {EKG/Telemetry Strips [phone]}  Recent Labs: 08/16/2019: Pro B Natriuretic peptide (BNP) 227.0 01/02/2020: Magnesium 2.0 04/17/2020: ALT 9; BUN 32; Creat 1.58; Hemoglobin 12.9; Platelets 189; Potassium 4.3; Sodium 141   Recent Lipid Panel Lab Results  Component Value Date/Time   CHOL 121 04/17/2020 02:51 PM   CHOL 140 11/11/2016 09:00 AM   TRIG 122 04/17/2020 02:51 PM   HDL 49 (L) 04/17/2020 02:51 PM   HDL 51 11/11/2016 09:00 AM   CHOLHDL 2.5 04/17/2020 02:51 PM   LDLCALC 52 04/17/2020 02:51 PM    Wt Readings from Last 3 Encounters:  07/01/20 130 lb (59 kg)  06/19/20 134 lb (60.8 kg)  04/17/20 136 lb (61.7 kg)     Risk Assessment/Calculations:   {Does this patient have ATRIAL FIBRILLATION?:[phone]}  Objective:    Vital Signs:  There were no vitals taken for this visit.   {[facility] Virtual Exam  (Optional):[id number]::"VITAL SIGNS:  reviewed"}  ASSESSMENT & PLAN:    ***   1. Other chest pain Cardiac catheterization in 2009 demonstrated no coronary artery disease and a Myoview in 08/2019 was low risk.  She has not really had significant chest pain since last seen.  No further work up needed.   2. Paroxysmal atrial fibrillation (HCC) She is tolerating anticoagulation.  Creatinine clearance 35.  Continue Rivaroxaban 15 mg once daily. Recent Hgb and Creatinine stable.   3. PSVT (paroxysmal supraventricular tachycardia) ([place]) She has not had symptoms of rapid palpitations.  Continue disopyramide.    4. Hypertrophic obstructive cardiomyopathy (HOCM) (HCC) No significant gradient or SAM on last echocardiogram.    5. Essential hypertension The patient's blood pressure is controlled on her current regimen.  Continue current therapy.    6. Thoracic aortic aneurysm without rupture (HCC) 4 cm by recent CT.  I reviewed this with the patient.  She will need a repeat CTA in 1 year.    7. Headache I asked her to contact her PCP for evaluation.  Shared Decision Making/Informed Consent   {Are you ordering a CV Procedure (e.g. stress test, cath, DCCV, TEE, etc)?   Press F2        :[id number]}    COVID-19 Education: The signs and symptoms of COVID-19 were discussed with the patient and how to seek care for testing (follow up with PCP or arrange E-visit).  ***The importance of social distancing was discussed today.  Time:   Today, I have spent *** minutes with the patient with telehealth technology discussing the above problems.     Medication Adjustments/Labs and Tests Ordered: Current medicines are reviewed at length with the patient today.  Concerns regarding medicines are outlined above.   Tests Ordered: No orders of the defined types were placed in this encounter.   Medication Changes: No orders of the defined types were placed in this encounter.   Follow Up:  {F/U  Format:[phone]} {follow up:15908}  Signed, Richardson Dopp, PA-C  07/26/2020 9:03 PM    [facility]

## 2020-07-27 ENCOUNTER — Other Ambulatory Visit: Payer: Self-pay

## 2020-07-27 ENCOUNTER — Telehealth: Payer: Medicare Other | Admitting: Physician Assistant

## 2020-07-27 DIAGNOSIS — I480 Paroxysmal atrial fibrillation: Secondary | ICD-10-CM

## 2020-07-27 DIAGNOSIS — I421 Obstructive hypertrophic cardiomyopathy: Secondary | ICD-10-CM

## 2020-07-27 DIAGNOSIS — N1831 Chronic kidney disease, stage 3a: Secondary | ICD-10-CM

## 2020-07-27 DIAGNOSIS — I712 Thoracic aortic aneurysm, without rupture, unspecified: Secondary | ICD-10-CM

## 2020-07-27 DIAGNOSIS — I10 Essential (primary) hypertension: Secondary | ICD-10-CM

## 2020-07-27 DIAGNOSIS — I471 Supraventricular tachycardia: Secondary | ICD-10-CM

## 2020-07-31 ENCOUNTER — Other Ambulatory Visit: Payer: Self-pay | Admitting: Internal Medicine

## 2020-08-01 ENCOUNTER — Other Ambulatory Visit: Payer: Self-pay | Admitting: Internal Medicine

## 2020-08-10 ENCOUNTER — Other Ambulatory Visit: Payer: Self-pay | Admitting: Internal Medicine

## 2020-08-13 NOTE — Progress Notes (Signed)
[facility] Return Visit  SUBJECTIVE  History of Present Illness: Monique [name] is a 84 y.o. female seen in follow-up for OAB/ nocturia and constipation. Plan at last visit was to start Myrbetriq 25mg  and to add in fiber supplementation. Has not seen any improvement with the Myrbetriq. She also had to pay $110 for the prescription, which is too expensive for her. Drinking now 3 bottles of water of day (instead of 1.5). Constipation has improved somewhat with just miralax, has not added fiber supplement but trying to eat more fiber.   Past Medical History: Patient  has a past medical history of Abdominal pain (11/18/2015), Acute respiratory failure with hypoxia ([place]) (06/27/2014), Breast pain, left (10/11/2016), Cerebral aneurysm 2000 (06/27/2014), Chest pain (10/30/2014), CHF (congestive heart failure) ([place]), Chronic anticoagulation (06/2014), Chronic kidney disease, stage III (moderate) ([place]) (11/21/2014), Clotting disorder ([place]), Diabetes ([place]), Dizziness and giddiness (11/28/2014), DVT of lower extremity (deep venous thrombosis) ([place]) (11/28/2014), Dysuria (07/07/2014), Elevated troponin (11/28/2014), Fatty liver, Gastritis (05/16/2012), Headache, acute (06/08/2010), Heart murmur, Hematochezia (11/18/2015), History of fibromuscular dysplasia (06/27/2014), HTN (hypertension), Hyperlipidemia, Hypertrophic obstructive cardiomyopathy (HOCM) ([place]) (05/20/2008), Kidney stones, LVH (left ventricular hypertrophy), Orthostatic hypotension (11/28/2014), PAF (paroxysmal atrial fibrillation) ([place]) (2000), Palpitations (11/28/2014), PAROXYSMAL ATRIAL FIBRILLATION (05/20/2008), PE (pulmonary embolism) (06/2014), Postmenopausal osteoporosis (04/2019), Pulmonary embolism, bilateral ([place]) (06/27/2014), SOB (shortness of breath) (06/27/2014), Subarachnoid hemorrhage ([place]) (2000), and UTI (urinary tract infection) (01/04/2015).   Past Surgical History: She  has a past surgical history that includes Abdominal  hysterectomy (2000); Cardiac catheterization (05/2008); and Brain hemorrage.   Medications: She has a current medication list which includes the following prescription(s): acetaminophen, alum & mag hydroxide-simeth, amlodipine, atorvastatin, disopyramide, lansoprazole, lidocaine, metformin, metoprolol tartrate, metoprolol tartrate, mirabegron er, mirtazapine, nitroglycerin, pantoprazole, sumatriptan, and xarelto.   Allergies: Patient has No Known Allergies.   Social History: Patient  reports that she quit smoking about 52 years ago. She has never used smokeless tobacco. She reports that she does not drink alcohol and does not use drugs.      OBJECTIVE     Physical Exam: Vitals:   08/14/20 0924  BP: (!) 142/84  Pulse: 68  Weight: 135 lb (61.2 kg)  Height: 5\' 6"  (1.676 m)   Gen: No apparent distress, A&O x 3.  Detailed Urogynecologic Evaluation:  Deferred.      ASSESSMENT AND PLAN    Monique [name] is a 84 y.o. with:  1. Nocturia   2. Constipation, unspecified constipation type    1. Nocturia/ OAB - Since she did have improvement with the Myrbetriq and it was too costly, will change to Trospium 20mg  BID. This is not covered by her insurance but there is a GoodRx coupon for $17 at [employer]. Rx was sent to [facility] and coupon was printed for patient. She is not a candidate for other anticholinergic medications due to her age and risk of dementia.   2. Constipation - continue to increase water and fiber intake. Discussed adding in fiber supplementation in addition to taking the miralax.   Return 4-6 weeks for medication follow up  Jaquita Folds, MD  Time spent: 30 min in the care of this patient on this encounter.

## 2020-08-14 ENCOUNTER — Other Ambulatory Visit: Payer: Self-pay

## 2020-08-14 ENCOUNTER — Encounter: Payer: Self-pay | Admitting: Obstetrics and Gynecology

## 2020-08-14 ENCOUNTER — Ambulatory Visit (INDEPENDENT_AMBULATORY_CARE_PROVIDER_SITE_OTHER): Payer: Medicare Other | Admitting: Obstetrics and Gynecology

## 2020-08-14 VITALS — BP 142/84 | HR 68 | Ht 66.0 in | Wt 135.0 lb

## 2020-08-14 DIAGNOSIS — R351 Nocturia: Secondary | ICD-10-CM | POA: Diagnosis not present

## 2020-08-14 DIAGNOSIS — K5900 Constipation, unspecified: Secondary | ICD-10-CM

## 2020-08-14 MED ORDER — TROSPIUM CHLORIDE 20 MG PO TABS: 20.0000 mg | ORAL_TABLET | Freq: Two times a day (BID) | ORAL | 5 refills | Status: DC

## 2020-08-14 NOTE — Patient Instructions (Signed)
We have prescribed a new medication for urinating at night- Trospium 20mg  twice a day.    Women should try to eat at least 21 to 25 grams of fiber a day, while men should aim for 30 to 38 grams a day. You can add fiber to your diet with food or a fiber supplement such as psyllium (metamucil), benefiber, or fibercon.   Here's a look at how much dietary fiber is found in some common foods. When buying packaged foods, check the Nutrition Facts label for fiber content. It can vary among brands.  Fruits Serving size Total fiber (grams)*  Raspberries 1 cup 8.0  Pear 1 medium 5.5  Apple, with skin 1 medium 4.5  Banana 1 medium 3.0  Orange 1 medium 3.0  Strawberries 1 cup 3.0   Vegetables Serving size Total fiber (grams)*  Green peas, boiled 1 cup 9.0  Broccoli, boiled 1 cup chopped 5.0  Turnip greens, boiled 1 cup 5.0  Brussels sprouts, boiled 1 cup 4.0  Potato, with skin, baked 1 medium 4.0  Sweet corn, boiled 1 cup 3.5  Cauliflower, raw 1 cup chopped 2.0  Carrot, raw 1 medium 1.5   Grains Serving size Total fiber (grams)*  Spaghetti, whole-wheat, cooked 1 cup 6.0  Barley, pearled, cooked 1 cup 6.0  Bran flakes 3/4 cup 5.5  Quinoa, cooked 1 cup 5.0  Oat bran muffin 1 medium 5.0  Oatmeal, instant, cooked 1 cup 5.0  Popcorn, air-popped 3 cups 3.5  Brown rice, cooked 1 cup 3.5  Bread, whole-wheat 1 slice 2.0  Bread, rye 1 slice 2.0   Legumes, nuts and seeds Serving size Total fiber (grams)*  Split peas, boiled 1 cup 16.0  Lentils, boiled 1 cup 15.5  Black beans, boiled 1 cup 15.0  Baked beans, canned 1 cup 10.0  Chia seeds 1 ounce 10.0  Almonds 1 ounce (23 nuts) 3.5  Pistachios 1 ounce (49 nuts) 3.0  Sunflower kernels 1 ounce 3.0  *Rounded to nearest 0.5 gram. Source: [employer] for [employer]

## 2020-09-07 ENCOUNTER — Ambulatory Visit: Payer: Medicare Other | Admitting: Neurology

## 2020-09-07 ENCOUNTER — Ambulatory Visit: Payer: Medicare Other | Admitting: Internal Medicine

## 2020-09-10 ENCOUNTER — Ambulatory Visit: Payer: Medicare Other | Admitting: Internal Medicine

## 2020-09-11 NOTE — Progress Notes (Deleted)
[facility] Return Visit  SUBJECTIVE  History of Present Illness: Monique [name] is a 85 y.o. female seen in follow-up for Nocturia and constipation. Plan at last visit was to change Myrbetriq to Trospium 20mg  BID. For her constipation, she was going to add in fiber supplementation in addition to miralax.     Past Medical History: Patient  has a past medical history of Abdominal pain (11/18/2015), Acute respiratory failure with hypoxia ([place]) (06/27/2014), Breast pain, left (10/11/2016), Cerebral aneurysm 2000 (06/27/2014), Chest pain (10/30/2014), CHF (congestive heart failure) ([place]), Chronic anticoagulation (06/2014), Chronic kidney disease, stage III (moderate) ([place]) (11/21/2014), Clotting disorder ([place]), Diabetes ([place]), Dizziness and giddiness (11/28/2014), DVT of lower extremity (deep venous thrombosis) ([place]) (11/28/2014), Dysuria (07/07/2014), Elevated troponin (11/28/2014), Fatty liver, Gastritis (05/16/2012), Headache, acute (06/08/2010), Heart murmur, Hematochezia (11/18/2015), History of fibromuscular dysplasia (06/27/2014), HTN (hypertension), Hyperlipidemia, Hypertrophic obstructive cardiomyopathy (HOCM) ([place]) (05/20/2008), Kidney stones, LVH (left ventricular hypertrophy), Orthostatic hypotension (11/28/2014), PAF (paroxysmal atrial fibrillation) ([place]) (2000), Palpitations (11/28/2014), PAROXYSMAL ATRIAL FIBRILLATION (05/20/2008), PE (pulmonary embolism) (06/2014), Postmenopausal osteoporosis (04/2019), Pulmonary embolism, bilateral ([place]) (06/27/2014), SOB (shortness of breath) (06/27/2014), Subarachnoid hemorrhage ([place]) (2000), and UTI (urinary tract infection) (01/04/2015).   Past Surgical History: She  has a past surgical history that includes Abdominal hysterectomy (2000); Cardiac catheterization (05/2008); and Brain hemorrage.   Medications: She has a current medication list which includes the following prescription(s): acetaminophen, alum & mag hydroxide-simeth, amlodipine,  atorvastatin, disopyramide, lansoprazole, lidocaine, metformin, metoprolol tartrate, metoprolol tartrate, mirtazapine, nitroglycerin, pantoprazole, sumatriptan, trospium, xarelto, and [DISCONTINUED] mirabegron er.   Allergies: Patient has No Known Allergies.   Social History: Patient  reports that she quit smoking about 53 years ago. She has never used smokeless tobacco. She reports that she does not drink alcohol and does not use drugs.      OBJECTIVE     Physical Exam: There were no vitals filed for this visit. Gen: No apparent distress, A&O x 3.  Detailed Urogynecologic Evaluation:  Deferred. Prior exam showed:  No flowsheet data found.     ASSESSMENT AND PLAN    [name] is a 85 y.o. with:  No diagnosis found.

## 2020-09-16 ENCOUNTER — Ambulatory Visit: Payer: Medicare Other | Admitting: Obstetrics and Gynecology

## 2020-09-19 ENCOUNTER — Other Ambulatory Visit: Payer: Self-pay | Admitting: Nurse Practitioner

## 2020-09-19 DIAGNOSIS — E782 Mixed hyperlipidemia: Secondary | ICD-10-CM

## 2020-09-19 DIAGNOSIS — I259 Chronic ischemic heart disease, unspecified: Secondary | ICD-10-CM

## 2020-09-19 DIAGNOSIS — I421 Obstructive hypertrophic cardiomyopathy: Secondary | ICD-10-CM

## 2020-09-21 NOTE — Telephone Encounter (Signed)
Pt's pharm

## 2020-09-21 NOTE — Telephone Encounter (Signed)
Pt called back in and I confirmed that she is taking the 50 mg 2 times daily.    [address]

## 2020-09-21 NOTE — Telephone Encounter (Signed)
Metoprolol was increased to 50 mg BID in Dec 2020.  Both 50 mg and 25 mg orders were on chart.  Pt confirmed she is taking 50 mg BID.  This has bee refilled.  Medication list updated.

## 2020-09-21 NOTE — Telephone Encounter (Signed)
Attempted to reach pt to confirm which dose of metoprolol she is taking.  No answer.  Left message for her to call back.

## 2020-09-21 NOTE — Telephone Encounter (Signed)
The most recent dose that we have listed is metoprolol 25 mg twice a day.  APP prescribed 50 mg Dec of 2020.

## 2020-09-24 ENCOUNTER — Ambulatory Visit: Payer: Medicare Other | Admitting: Internal Medicine

## 2020-09-25 ENCOUNTER — Ambulatory Visit: Payer: Medicare Other | Admitting: Internal Medicine

## 2020-10-06 ENCOUNTER — Other Ambulatory Visit: Payer: Self-pay

## 2020-10-06 ENCOUNTER — Ambulatory Visit: Payer: Medicare Other | Admitting: Diagnostic Neuroimaging

## 2020-10-06 ENCOUNTER — Encounter: Payer: Self-pay | Admitting: Diagnostic Neuroimaging

## 2020-10-06 ENCOUNTER — Other Ambulatory Visit: Payer: Self-pay | Admitting: Internal Medicine

## 2020-10-06 VITALS — BP 137/82 | HR 61 | Ht 66.0 in | Wt 138.2 lb

## 2020-10-06 DIAGNOSIS — R519 Headache, unspecified: Secondary | ICD-10-CM | POA: Diagnosis not present

## 2020-10-06 DIAGNOSIS — E1142 Type 2 diabetes mellitus with diabetic polyneuropathy: Secondary | ICD-10-CM

## 2020-10-06 NOTE — Patient Instructions (Addendum)
  LEFT SIDED HEADACHES (tension vs migraine variant) - tried and failed sumatriptan - continue tylenol as needed - consider topiramate vs CGRP antagonists; patient wants to hold off on additional meds for now  DIABETIC NEUROPATHY - consider gabapentin or duloxetine

## 2020-10-06 NOTE — Progress Notes (Signed)
[facility]  PATIENT: Monique [name] DOB: Nov 16, 1935  REFERRING CLINICIAN: Marrian Salvage,* HISTORY FROM: PATIENT  REASON FOR VISIT: new consult    HISTORICAL  CHIEF COMPLAINT:  Chief Complaint  Patient presents with  . Headache    Rm 6 New Pt  "pain on left side of head where the blood vessels burst in 2000; only take Tylenol as needed but not helpful"    HISTORY OF PRESENT ILLNESS:   85 year old female here for evaluation of left-sided headaches.  History of pulmonary embolism, atrial fibrillation, cardiomyopathy, chronic kidney disease, diabetes, hyperlipidemia.  Symptoms started in April 2021.  She describes intermittent left parietal, posterior auricular pressure and tension without associated symptoms.  Headaches occur twice a week.  Headaches can last 10 to 15 minutes at a time.  Headaches aggravated by sunlight or heat on that area.  She is nervous because she had subarachnoid hemorrhage in the year 2000 and this reminds her of the symptoms.  Patient went to PCP and had CT of the head which showed no acute findings.  Patient has tried Tylenol and sumatriptan without relief.  Patient also had right-sided headaches in 2016, saw another neurologist Dr. Posey Pronto, and was doing better on Zoloft at that time.  Patient has remote history of migraine headaches with aura when she was younger.  She describes global headaches with photophobia and seeing spots and sparkles.  No nausea or vomiting.  Also mentions some numbness and burning in bilateral feet, for past few years.  Has history of diabetes.    REVIEW OF SYSTEMS: Full 14 system review of systems performed and negative with exception of: As per HPI.  ALLERGIES: No Known Allergies  HOME MEDICATIONS: Outpatient Medications Prior to Visit  Medication Sig Dispense Refill  . acetaminophen (TYLENOL) 500 MG tablet Take 1,000 mg by mouth.    Marland Kitchen amLODipine (NORVASC) 5 MG tablet TAKE 1 TABLET BY MOUTH  2 TIMES A DAY 180 tablet 3  . atorvastatin (LIPITOR) 20 MG tablet TAKE 1 TABLET (20 MG TOTAL) BY MOUTH DAILY AT 6 PM. 90 tablet 1  . disopyramide (NORPACE) 150 MG capsule TAKE 1 CAPSULE (150 MG TOTAL) BY MOUTH 2 (TWO) TIMES DAILY. 180 capsule 3  . lansoprazole (PREVACID) 30 MG capsule Take 1 capsule (30 mg total) by mouth daily at 12 noon. 90 capsule 3  . lidocaine (LIDODERM) 5 % Place 1 patch onto the skin daily. 30 patch 3  . metoprolol tartrate (LOPRESSOR) 50 MG tablet Take 1 tablet (50 mg total) by mouth 2 (two) times daily. 180 tablet 1  . mirtazapine (REMERON) 15 MG tablet TAKE 1 TABLET BY MOUTH EVERYDAY AT BEDTIME 90 tablet 1  . pantoprazole (PROTONIX) 40 MG tablet TAKE 1 TABLET BY MOUTH EVERY DAY 90 tablet 1  . trospium (SANCTURA) 20 MG tablet Take 1 tablet (20 mg total) by mouth 2 (two) times daily. 60 tablet 5  . XARELTO 15 MG TABS tablet TAKE 1 TABLET BY MOUTH EVERY DAY WITH SUPPER 90 tablet 1  . metFORMIN (GLUCOPHAGE) 500 MG tablet TAKE 1 TABLET (500 MG TOTAL) BY MOUTH DAILY WITH BREAKFAST. 90 tablet 1  . alum & mag hydroxide-simeth (MAALOX/MYLANTA) 200-200-20 MG/5ML suspension Take 15 mLs by mouth every 4 (four) hours as needed for indigestion or heartburn. (Patient not taking: Reported on 10/06/2020) 355 mL 0  . nitroGLYCERIN (NITROSTAT) 0.4 MG SL tablet PLACE 1 TABLET (0.4 MG TOTAL) UNDER THE TONGUE EVERY 5 (FIVE) MINUTES AS NEEDED FOR CHEST  PAIN. (Patient not taking: Reported on 10/06/2020) 75 tablet 1  . SUMAtriptan (IMITREX) 50 MG tablet TAKE 1 TABLET BY MOUTH EVERY DAY AS NEEDED. MAY REPEAT IN 2 HOURS IF HEADACHE PERSISTS OR RECURS. (Patient not taking: Reported on 10/06/2020) 10 tablet 0   No facility-administered medications prior to visit.    PAST MEDICAL HISTORY: Past Medical History:  Diagnosis Date  . Abdominal pain 11/18/2015  . Acute respiratory failure with hypoxia ([place]) 06/27/2014  . Breast pain, left 10/11/2016  . Cerebral aneurysm 2000 06/27/2014  . Chest pain 10/30/2014   . CHF (congestive heart failure) ([place])   . Chronic anticoagulation 06/2014   Coumadin started after bilateral PE  . Chronic kidney disease, stage III (moderate) ([place]) 11/21/2014  . Clotting disorder ([place])    right was removed, still have left cataract  . Diabetes ([place])   . Dizziness and giddiness 11/28/2014  . DVT of lower extremity (deep venous thrombosis) ([place]) 11/28/2014  . Dysuria 07/07/2014  . Elevated troponin 11/28/2014  . Fatty liver   . Gastritis 05/16/2012   Chronic dyspepsia on PPI daily.   Marland Kitchen Headache, acute 06/08/2010   Qualifier: Diagnosis of  By: Linda Hedges MD, Heinz Knuckles   . Heart murmur   . Hematochezia 11/18/2015  . History of fibromuscular dysplasia 06/27/2014  . HTN (hypertension)    Essential  . Hyperlipidemia   . Hypertrophic obstructive cardiomyopathy (HOCM) ([place]) 05/20/2008   Qualifier: Diagnosis of  By: Linda Hedges MD, Heinz Knuckles   . Kidney stones   . LVH (left ventricular hypertrophy)   . Orthostatic hypotension 11/28/2014  . PAF (paroxysmal atrial fibrillation) ([place]) 2000  . Palpitations 11/28/2014  . PAROXYSMAL ATRIAL FIBRILLATION 05/20/2008   Qualifier: Diagnosis of  By: Linda Hedges MD, Heinz Knuckles   . PE (pulmonary embolism) 06/2014   Bilateral  . Postmenopausal osteoporosis 04/2019   T score -3.3  . Pulmonary embolism, bilateral ([place]) 06/27/2014  . SOB (shortness of breath) 06/27/2014  . Subarachnoid hemorrhage ([place]) 2000  . UTI (urinary tract infection) 01/04/2015    PAST SURGICAL HISTORY: Past Surgical History:  Procedure Laterality Date  . ABDOMINAL HYSTERECTOMY  2000  . Brain hemorrage     2000  . CARDIAC CATHETERIZATION  05/2008   Nl cors, no gradient, EF 60%    FAMILY HISTORY: Family History  Problem Relation Age of Onset  . Breast cancer Mother 22       Deceased  . Stroke Mother   . Arrhythmia Mother   . Heart disease Mother   . Colon cancer Sister   . Cervical cancer Sister   . Arrhythmia Sister   . Breast cancer Daughter 67  . Esophageal cancer  Neg Hx   . Pancreatic cancer Neg Hx   . Rectal cancer Neg Hx   . Stomach cancer Neg Hx     SOCIAL HISTORY: Social History   Socioeconomic History  . Marital status: Divorced    Spouse name: Not on file  . Number of children: 4  . Years of education: 70  . Highest education level: Not on file  Occupational History  . Occupation: Retired [employer]  . Smoking status: Former Smoker    Quit date: 09/06/1967    Years since quitting: 53.1  . Smokeless tobacco: Never Used  Vaping Use  . Vaping Use: Never used  Substance and Sexual Activity  . Alcohol use: No    Alcohol/week: 0.0 standard drinks  . Drug use: No  . Sexual activity:  Not Currently    Comment: 1st intercourse 85yo-Fewer than 5 partners  Other Topics Concern  . Not on file  Social History Narrative   HSG. Married '61 - '88/divorced. 3 sons - '74, '59, '62, 1 daughter '63; 5 grandchildren. 10/06/20 Lives alone in two story home.    She does not drive.   Retired from [employer].   Social Determinants of Health   Financial Resource Strain: Low Risk   . Difficulty of Paying Living Expenses: Not very hard  Food Insecurity: Not on file  Transportation Needs: Not on file  Physical Activity: Not on file  Stress: Not on file  Social Connections: Not on file  Intimate Partner Violence: Not on file     PHYSICAL EXAM  GENERAL EXAM/CONSTITUTIONAL: Vitals:  Vitals:   10/06/20 1310  BP: 137/82  Pulse: 61  Weight: 138 lb 3.2 oz (62.7 kg)  Height: 5\' 6"  (1.676 m)     Body mass index is 22.31 kg/m. Wt Readings from Last 3 Encounters:  10/06/20 138 lb 3.2 oz (62.7 kg)  08/14/20 135 lb (61.2 kg)  07/01/20 130 lb (59 kg)     Patient is in no distress; well developed, nourished and groomed; neck is supple  CARDIOVASCULAR:  Examination of carotid arteries is normal; no carotid bruits  Regular rate and rhythm, no murmurs  Examination of peripheral vascular system by  observation and palpation is normal  EYES:  Ophthalmoscopic exam of optic discs and posterior segments is normal; no papilledema or hemorrhages  No exam data present  MUSCULOSKELETAL:  Gait, strength, tone, movements noted in Neurologic exam below  NEUROLOGIC: MENTAL STATUS:  No flowsheet data found.  awake, alert, oriented to person, place and time  recent and remote memory intact  normal attention and concentration  language fluent, comprehension intact, naming intact  fund of knowledge appropriate  CRANIAL NERVE:   2nd - no papilledema on fundoscopic exam  2nd, 3rd, 4th, 6th - pupils equal and reactive to light, visual fields full to confrontation, extraocular muscles intact, no nystagmus  5th - facial sensation symmetric  7th - facial strength symmetric  8th - hearing intact  9th - palate elevates symmetrically, uvula midline  11th - shoulder shrug symmetric  12th - tongue protrusion midline  MOTOR:   normal bulk and tone  BUE 4+  BLE 4 PROX, 5 DISTAL  SENSORY:   normal and symmetric to light touch, temperature, vibration; EXCEPT DECR IN TOES  COORDINATION:   finger-nose-finger, fine finger movements normal  REFLEXES:   deep tendon reflexes TRACE and symmetric; ABSENT AT ANKLES  GAIT/STATION:   narrow based gait     DIAGNOSTIC DATA (LABS, IMAGING, TESTING) - I reviewed patient records, labs, notes, testing and imaging myself where available.  Lab Results  Component Value Date   WBC 8.2 04/17/2020   HGB 12.9 04/17/2020   HCT 40.3 04/17/2020   MCV 96.9 04/17/2020   PLT 189 04/17/2020      Component Value Date/Time   NA 141 04/17/2020 1451   NA 142 04/29/2019 1654   K 4.3 04/17/2020 1451   CL 106 04/17/2020 1451   CO2 30 04/17/2020 1451   GLUCOSE 69 04/17/2020 1451   BUN 32 (H) 04/17/2020 1451   BUN 22 04/29/2019 1654   CREATININE 1.58 (H) 04/17/2020 1451   CALCIUM 10.1 04/17/2020 1451   PROT 6.5 04/17/2020 1451   ALBUMIN  4.0 01/02/2020 1055   AST 18 04/17/2020 1451   ALT 9  04/17/2020 1451   ALKPHOS 82 01/02/2020 1055   BILITOT 0.4 04/17/2020 1451   GFRNONAA 43 (L) 08/22/2019 0238   GFRAA 50 (L) 08/22/2019 0238   Lab Results  Component Value Date   CHOL 121 04/17/2020   HDL 49 (L) 04/17/2020   LDLCALC 52 04/17/2020   TRIG 122 04/17/2020   CHOLHDL 2.5 04/17/2020   Lab Results  Component Value Date   HGBA1C 6.7 (H) 04/17/2020   Lab Results  Component Value Date   JQZESPQZ30 076 12/04/2014   Lab Results  Component Value Date   TSH 2.660 04/29/2019    03/23/1999 CT head  MASSIVE SUBARACHNOID HEMORRHAGE.  FINDINGS ARE MOST LIKELY ON THE BASIS OF ANEURYSM RUPTURE.  GIVEN THE LARGE AMOUNT OF BLOOD, IT IS DIFFICULT TO LOCALIZE WHERE THE POTENTIAL ANEURYSM RUPTURE  IS LOCATED ALTHOUGH IT IS THOUGHT MORE LIKELY TO BE REPRESENTATIVE OF POSTERIOR COMMUNICATOR  ANEURYSM RUPTURE GIVEN LOCATION.  04/02/1999 cerebral angiogram IMPRESSION  2-2.5 MM SACRAL ANEURYSM WITH A NARROW NECK IN THE CAVERNOUS SEGMENT OF THE LEFT INTERNAL CAROTID  ARTERY.  SEGMENTAL AREAS OF NARROWING INTERSPERSED WITH NORMAL DIAMETER  INVOLVING THE LEFT INTERNAL  CAROTID ARTERY, THE RIGHT INTERNAL CAROTID ARTERY AND THE RIGHT VERTEBRAL ARTERY AND THE DISTAL  CERVICAL SEGMENTS. THESE FINDINGS ARE MOST CONSISTENT WITH FIBROMUSCULAR DYSPLASIA.  A FUSIFORM DILATATION OF THE LEFT INTERNAL CAROTID ARTERY PROXIMAL TO THE PETROUS SEGMENT IS ALSO  MOST LIKELY RELATED TO THE FIBROMUSCULAR DYSPLASIA.  NO ANGIOGRAPHIC EVIDENCE OF SPASM IS IDENTIFIED AT THIS TIME.   09/15/17 MRI HEAD: 1. No acute intracranial process. 2. 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 3. Mild chronic small vessel ischemic disease. Old small LEFT cerebellar infarct.   09/23/17 MRA HEAD: 1. No emergent large vessel occlusion or flow limiting stenosis. Stable examination.   12/12/19 CT head  - Bilateral subdural hygromas are again  seen stable in appearance from the prior exam. - Chronic changes stable from the previous exam. No acute abnormality noted.    ASSESSMENT AND PLAN  85 y.o. year old female here with left-sided headaches with tension headache features versus migraine variant; since April 2021.  Neurologic examination unremarkable.  Dx:  1. Left-sided headache   2. Diabetic polyneuropathy associated with type 2 diabetes mellitus (HCC)       PLAN:  LEFT SIDED HEADACHES (tension vs migraine variant) - tried and failed sumatriptan - continue tylenol as needed - consider topiramate vs CGRP antagonists; patient wants to hold off on additional meds for now  DIABETIC NEUROPATHY - consider gabapentin or duloxetine  No orders of the defined types were placed in this encounter.   No orders of the defined types were placed in this encounter.   No follow-ups on file.    Penni Bombard, MD 10/08/6331, 5:45 PM Certified in Neurology, Neurophysiology and Neuroimaging  [facility] [address] [place], [place] [address] [phone]

## 2020-10-26 ENCOUNTER — Ambulatory Visit (INDEPENDENT_AMBULATORY_CARE_PROVIDER_SITE_OTHER): Payer: Medicare Other | Admitting: Family

## 2020-10-26 ENCOUNTER — Telehealth: Payer: Self-pay | Admitting: Family

## 2020-10-26 ENCOUNTER — Telehealth: Payer: Self-pay | Admitting: Internal Medicine

## 2020-10-26 ENCOUNTER — Other Ambulatory Visit: Payer: Self-pay

## 2020-10-26 ENCOUNTER — Encounter: Payer: Self-pay | Admitting: Family

## 2020-10-26 VITALS — BP 138/90 | HR 71 | Temp 98.2°F | Resp 18 | Ht 66.0 in | Wt 136.8 lb

## 2020-10-26 DIAGNOSIS — R0789 Other chest pain: Secondary | ICD-10-CM | POA: Diagnosis not present

## 2020-10-26 DIAGNOSIS — E119 Type 2 diabetes mellitus without complications: Secondary | ICD-10-CM

## 2020-10-26 DIAGNOSIS — R002 Palpitations: Secondary | ICD-10-CM | POA: Diagnosis not present

## 2020-10-26 LAB — CBC WITH DIFFERENTIAL/PLATELET
Basophils Absolute: 0 10*3/uL (ref 0.0–0.1)
Basophils Relative: 0.2 % (ref 0.0–3.0)
Eosinophils Absolute: 0 10*3/uL (ref 0.0–0.7)
Eosinophils Relative: 0.3 % (ref 0.0–5.0)
HCT: 39.4 % (ref 36.0–46.0)
Hemoglobin: 13.1 g/dL (ref 12.0–15.0)
Lymphocytes Relative: 36.3 % (ref 12.0–46.0)
Lymphs Abs: 3.3 10*3/uL (ref 0.7–4.0)
MCHC: 33.1 g/dL (ref 30.0–36.0)
MCV: 94.1 fl (ref 78.0–100.0)
Monocytes Absolute: 0.5 10*3/uL (ref 0.1–1.0)
Monocytes Relative: 5.1 % (ref 3.0–12.0)
Neutro Abs: 5.3 10*3/uL (ref 1.4–7.7)
Neutrophils Relative %: 58.1 % (ref 43.0–77.0)
Platelets: 194 10*3/uL (ref 150.0–400.0)
RBC: 4.19 Mil/uL (ref 3.87–5.11)
RDW: 13.6 % (ref 11.5–15.5)
WBC: 9.2 10*3/uL (ref 4.0–10.5)

## 2020-10-26 LAB — COMPREHENSIVE METABOLIC PANEL
ALT: 10 U/L (ref 0–35)
AST: 22 U/L (ref 0–37)
Albumin: 4.1 g/dL (ref 3.5–5.2)
Alkaline Phosphatase: 100 U/L (ref 39–117)
BUN: 25 mg/dL — ABNORMAL HIGH (ref 6–23)
CO2: 29 mEq/L (ref 19–32)
Calcium: 10 mg/dL (ref 8.4–10.5)
Chloride: 104 mEq/L (ref 96–112)
Creatinine, Ser: 1.24 mg/dL — ABNORMAL HIGH (ref 0.40–1.20)
GFR: 40.5 mL/min — ABNORMAL LOW (ref >60.00)
Glucose, Bld: 81 mg/dL (ref 70–99)
Potassium: 4.4 mEq/L (ref 3.5–5.1)
Sodium: 143 mEq/L (ref 135–145)
Total Bilirubin: 0.6 mg/dL (ref 0.2–1.2)
Total Protein: 7 g/dL (ref 6.0–8.3)

## 2020-10-26 LAB — HEMOGLOBIN A1C: Hgb A1c MFr Bld: 6.8 % — ABNORMAL HIGH (ref 4.6–6.5)

## 2020-10-26 LAB — TROPONIN I (HIGH SENSITIVITY): High Sens Troponin I: 13 ng/L (ref 2–17)

## 2020-10-26 NOTE — Telephone Encounter (Signed)
error 

## 2020-10-26 NOTE — Telephone Encounter (Signed)
Patient called and said that at night she has a fast heart rate, chest pain and pain around her heart. She said that it wake her up at night and she has to sit up. Transferred to [facility]

## 2020-10-26 NOTE — Progress Notes (Signed)
Monique [name] is a 85 y.o. female with the following history as recorded in EpicCare:  Patient Active Problem List   Diagnosis Date Noted  . Muscle spasm 01/02/2020  . Thoracic aortic aneurysm without rupture ([place]) 12/11/2019  . PSVT (paroxysmal supraventricular tachycardia) ([place]) 08/22/2019  . Chest pain 08/21/2019  . Blood in stool 01/04/2018  . Diabetes mellitus type 2, controlled, without complications ([place]) 85/27/7824  . Allergic rhinitis 06/20/2017  . Headache 10/23/2015  . Chronic RLQ pain 04/24/2015  . UTI (urinary tract infection) 01/04/2015  . Chest tightness 11/28/2014  . Chronic kidney disease, stage III (moderate) ([place]) 11/21/2014  . Pulmonary embolism, bilateral ([place]) 06/27/2014  . Cerebral aneurysm 2000 06/27/2014  . Hyperlipidemia associated with type 2 diabetes mellitus ([place]) 05/20/2008  . Essential hypertension 05/20/2008  . Hypertrophic obstructive cardiomyopathy (HOCM) ([place]) 05/20/2008  . PAROXYSMAL ATRIAL FIBRILLATION 05/20/2008  . Subarachnoid hemorrhage 2000 05/20/2008    Current Outpatient Medications  Medication Sig Dispense Refill  . acetaminophen (TYLENOL) 500 MG tablet Take 1,000 mg by mouth.    Marland Kitchen amLODipine (NORVASC) 5 MG tablet TAKE 1 TABLET BY MOUTH 2 TIMES A DAY 180 tablet 3  . atorvastatin (LIPITOR) 20 MG tablet TAKE 1 TABLET (20 MG TOTAL) BY MOUTH DAILY AT 6 PM. 90 tablet 1  . disopyramide (NORPACE) 150 MG capsule TAKE 1 CAPSULE (150 MG TOTAL) BY MOUTH 2 (TWO) TIMES DAILY. 180 capsule 3  . lansoprazole (PREVACID) 30 MG capsule Take 1 capsule (30 mg total) by mouth daily at 12 noon. 90 capsule 3  . lidocaine (LIDODERM) 5 % Place 1 patch onto the skin daily. 30 patch 3  . metFORMIN (GLUCOPHAGE) 500 MG tablet TAKE 1 TABLET (500 MG TOTAL) BY MOUTH DAILY WITH BREAKFAST. 90 tablet 1  . metoprolol tartrate (LOPRESSOR) 50 MG tablet Take 1 tablet (50 mg total) by mouth 2 (two) times daily. 180 tablet 1  . mirtazapine (REMERON) 15 MG tablet TAKE 1 TABLET  BY MOUTH EVERYDAY AT BEDTIME 90 tablet 1  . nitroGLYCERIN (NITROSTAT) 0.4 MG SL tablet PLACE 1 TABLET (0.4 MG TOTAL) UNDER THE TONGUE EVERY 5 (FIVE) MINUTES AS NEEDED FOR CHEST PAIN. 75 tablet 1  . pantoprazole (PROTONIX) 40 MG tablet TAKE 1 TABLET BY MOUTH EVERY DAY 90 tablet 1  . SUMAtriptan (IMITREX) 50 MG tablet TAKE 1 TABLET BY MOUTH EVERY DAY AS NEEDED. MAY REPEAT IN 2 HOURS IF HEADACHE PERSISTS OR RECURS. 10 tablet 0  . trospium (SANCTURA) 20 MG tablet Take 1 tablet (20 mg total) by mouth 2 (two) times daily. 60 tablet 5  . XARELTO 15 MG TABS tablet TAKE 1 TABLET BY MOUTH EVERY DAY WITH SUPPER 90 tablet 1  . alum & mag hydroxide-simeth (MAALOX/MYLANTA) 200-200-20 MG/5ML suspension Take 15 mLs by mouth every 4 (four) hours as needed for indigestion or heartburn. (Patient not taking: No sig reported) 355 mL 0   No current facility-administered medications for this visit.    Allergies: Patient has no known allergies.  Past Medical History:  Diagnosis Date  . Abdominal pain 11/18/2015  . Acute respiratory failure with hypoxia ([place]) 06/27/2014  . Breast pain, left 10/11/2016  . Cerebral aneurysm 2000 06/27/2014  . Chest pain 10/30/2014  . CHF (congestive heart failure) ([place])   . Chronic anticoagulation 06/2014   Coumadin started after bilateral PE  . Chronic kidney disease, stage III (moderate) ([place]) 11/21/2014  . Clotting disorder ([place])    right was removed, still have left cataract  . Diabetes ([place])   .  Dizziness and giddiness 11/28/2014  . DVT of lower extremity (deep venous thrombosis) ([place]) 11/28/2014  . Dysuria 07/07/2014  . Elevated troponin 11/28/2014  . Fatty liver   . Gastritis 05/16/2012   Chronic dyspepsia on PPI daily.   Marland Kitchen Headache, acute 06/08/2010   Qualifier: Diagnosis of  By: Linda Hedges MD, Heinz Knuckles   . Heart murmur   . Hematochezia 11/18/2015  . History of fibromuscular dysplasia 06/27/2014  . HTN (hypertension)    Essential  . Hyperlipidemia   . Hypertrophic obstructive  cardiomyopathy (HOCM) ([place]) 05/20/2008   Qualifier: Diagnosis of  By: Linda Hedges MD, Heinz Knuckles   . Kidney stones   . LVH (left ventricular hypertrophy)   . Orthostatic hypotension 11/28/2014  . PAF (paroxysmal atrial fibrillation) ([place]) 2000  . Palpitations 11/28/2014  . PAROXYSMAL ATRIAL FIBRILLATION 05/20/2008   Qualifier: Diagnosis of  By: Linda Hedges MD, Heinz Knuckles   . PE (pulmonary embolism) 06/2014   Bilateral  . Postmenopausal osteoporosis 04/2019   T score -3.3  . Pulmonary embolism, bilateral ([place]) 06/27/2014  . SOB (shortness of breath) 06/27/2014  . Subarachnoid hemorrhage ([place]) 2000  . UTI (urinary tract infection) 01/04/2015    Past Surgical History:  Procedure Laterality Date  . ABDOMINAL HYSTERECTOMY  2000  . Brain hemorrage     2000  . CARDIAC CATHETERIZATION  05/2008   Nl cors, no gradient, EF 60%    Family History  Problem Relation Age of Onset  . Breast cancer Mother 28       Deceased  . Stroke Mother   . Arrhythmia Mother   . Heart disease Mother   . Colon cancer Sister   . Cervical cancer Sister   . Arrhythmia Sister   . Breast cancer Daughter 75  . Esophageal cancer Neg Hx   . Pancreatic cancer Neg Hx   . Rectal cancer Neg Hx   . Stomach cancer Neg Hx     Social History   Tobacco Use  . Smoking status: Former Smoker    Quit date: 09/06/1967    Years since quitting: 53.1  . Smokeless tobacco: Never Used  Substance Use Topics  . Alcohol use: No    Alcohol/week: 0.0 standard drinks    Subjective:  Patient notes that on Friday she noticed that her heart was "beating a little fast"- did not check actual pulse however; by Saturday, seemed to have stopped; notes the symptoms lasted for "a while" but could not give definite timeline;  Felt this morning that her chest was "tight" for just a few minutes but now feels completely back to normal; Has not reached out to her cardiologist- overdue to see them in office;   Objective:  Vitals:   10/26/20 1422  BP:  138/90  Pulse: 71  Resp: 18  Temp: 98.2 F (36.8 C)  TempSrc: Oral  SpO2: 97%  Weight: 136 lb 12.8 oz (62.1 kg)  Height: 5' 6"  (1.676 m)    General: Well developed, well nourished, in no acute distress  Skin : Warm and dry.  Head: Normocephalic and atraumatic  Eyes: Sclera and conjunctiva clear; pupils round and reactive to light; extraocular movements intact  Ears: External normal; canals clear; tympanic membranes normal  Oropharynx: Pink, supple. No suspicious lesions  Neck: Supple without thyromegaly, adenopathy  Lungs: Respirations unlabored; clear to auscultation bilaterally without wheeze, rales, rhonchi  CVS exam: normal rate and regular rhythm.  Musculoskeletal: No deformities; no active joint inflammation  Extremities: No edema, cyanosis, clubbing  Vessels:  Symmetric bilaterally  Neurologic: Alert and oriented; speech intact; face symmetrical; moves all extremities well; CNII-XII intact without focal deficit   Assessment:  1. Palpitations   2. Sensation of chest tightness   3. Controlled type 2 diabetes mellitus without complication, without long-term current use of insulin ([place])     Plan:  EKG shows NSR in office today; pulse is reassuring- no tachycardia noted; will check CBC, CMP, troponin in office; emergent referral back to patient's cardiologist- do not feel she has to go to ER at this time but strict ER precautions discussed; follow-up to be determined;  This visit occurred during the SARS-CoV-2 public health emergency.  Safety protocols were in place, including screening questions prior to the visit, additional usage of staff PPE, and extensive cleaning of exam room while observing appropriate contact time as indicated for disinfecting solutions.      No follow-ups on file.  Orders Placed This Encounter  Procedures  . CBC with Differential/Platelet    Standing Status:   Future    Standing Expiration Date:   10/26/2021  . Comp Met (CMET)    Standing Status:    Future    Standing Expiration Date:   10/26/2021  . Troponin I    Standing Status:   Future    Standing Expiration Date:   10/26/2021  . Hemoglobin A1c    Standing Status:   Future    Standing Expiration Date:   10/26/2021  . Ambulatory referral to Cardiology    Referral Priority:   Emergency    Referral Type:   Consultation    Referral Reason:   Specialty Services Required    Referred to Provider:   Fay Records, MD    Requested Specialty:   Cardiology    Number of Visits Requested:   1  . EKG 12-Lead    Requested Prescriptions    No prescriptions requested or ordered in this encounter

## 2020-11-06 ENCOUNTER — Other Ambulatory Visit: Payer: Self-pay | Admitting: Internal Medicine

## 2020-11-09 ENCOUNTER — Other Ambulatory Visit: Payer: Self-pay

## 2020-11-09 MED ORDER — MIRTAZAPINE 15 MG PO TABS: ORAL_TABLET | ORAL | 1 refills | Status: DC

## 2020-11-17 ENCOUNTER — Other Ambulatory Visit: Payer: Self-pay | Admitting: Obstetrics and Gynecology

## 2020-11-17 DIAGNOSIS — R351 Nocturia: Secondary | ICD-10-CM

## 2020-11-18 ENCOUNTER — Ambulatory Visit: Payer: Medicare Other | Admitting: Physician Assistant

## 2020-11-18 ENCOUNTER — Other Ambulatory Visit: Payer: Self-pay

## 2020-11-18 ENCOUNTER — Encounter: Payer: Self-pay | Admitting: Physician Assistant

## 2020-11-18 VITALS — BP 122/72 | HR 58 | Ht 66.0 in | Wt 136.4 lb

## 2020-11-18 DIAGNOSIS — I712 Thoracic aortic aneurysm, without rupture, unspecified: Secondary | ICD-10-CM

## 2020-11-18 DIAGNOSIS — I421 Obstructive hypertrophic cardiomyopathy: Secondary | ICD-10-CM

## 2020-11-18 DIAGNOSIS — I471 Supraventricular tachycardia: Secondary | ICD-10-CM | POA: Diagnosis not present

## 2020-11-18 DIAGNOSIS — I10 Essential (primary) hypertension: Secondary | ICD-10-CM | POA: Diagnosis not present

## 2020-11-18 DIAGNOSIS — I480 Paroxysmal atrial fibrillation: Secondary | ICD-10-CM | POA: Diagnosis not present

## 2020-11-18 DIAGNOSIS — R072 Precordial pain: Secondary | ICD-10-CM

## 2020-11-18 NOTE — Progress Notes (Signed)
Cardiology Office Note:    Date:  11/18/2020   ID:  Nashla Althoff, DOB 20-May-1936, MRN [id number]  PCP:  Hoyt Koch, [place]  Cardiologist:  Dorris Carnes, MD   Electrophysiologist:  None       Referring MD: Marrian Salvage,*   Chief Complaint:  Chest Pain    Patient Profile:    Shakya Sebring is a 85 y.o. female with:   Hypertrophic obstructive cardiomyopathy ? Echocardiogram 1/19: Gradient 34 mmHg ? Echocardiogram 09/2019: no asymmetric hypertrophy or SAM, gradient 5 mmHg  Hypertension  Paroxysmal atrial fibrillation  CHA2DS2-VASc=4 (HTN, age x 2, female) >> Rivaroxaban  CrCl (12/2019): 35 >> Rivaroxaban 15 mg   Supraventricular Tachycardia   Disopyramide, Metoprolol   Thoracic aortic aneurysm ? CT 12/2019: 4 cm  History of DVT   Hx of bilateral pulmonary embolism  Chronic kidney disease stage III  Cerebral aneurysm  Hx of subarachnoid hemorrhage  Hyperlipidemia  Cardiac catheterization 2009: No CAD  Myoview 1/19: Normal perfusion   Myoview 08/2019: Low risk  Prior CV Studies: Chest CTA 12/05/2019 Ascending thoracic aortic aneurysm 4 cm, mild emphysema, aortic atherosclerosis  Echocardiogram 09/16/2019 EF 65-70, mild LVH, GRII DD, no RWMA, intracavitary gradient 5 mmHg, normal RV SF, mild LAE, trivial MR, RVSP 45.9 mmHg, no obvious asymmetric hypertrophy or SAM (prior intracavitary gradient 34 mmHg; 5 mmHg on this study)  Myoview 09/04/2019 EF 65, normal perfusion; Low Risk  Myoview 09/08/2017 EF 64, normal perfusion; low risk  Echocardiogram 09/09/2017 Moderate LVH, EF 65-70, intracavitary gradient 34 mmHg, asymmetric LVH, mild SAM, GR 1 DD, mean aortic valve gradient 21 mmHg  Cardiac catheterization 05/11/2008 No CAD EF 60; no gradient on pullback from mid LV to the LVOT to the aorta-no evidence for LVOT gradient  History of Present Illness:    Ms. Heiden was last seen by Dr. Harrington Challenger in  7/21.  She was seen in primary care last month with chest pain.  A hs-Troponin was obtained and this was normal.  She was asked to f/u with Cardiology.  She is here alone today.  She has noted L sided and substernal chest pain for the last several weeks.  It is not really associated with exertion.  She has not had associated shortness of breath, nausea or diaphoresis.  She does have L arm pain associated with this sometimes.  She has occasional palpitations.  She has not had orthopnea, paroxysmal nocturnal dyspnea, leg edema. She has not had syncope.  She thought it may have been something with her breast but notes a recent mammogram was normal.        Past Medical History:  Diagnosis Date  . Abdominal pain 11/18/2015  . Acute respiratory failure with hypoxia ([place]) 06/27/2014  . Breast pain, left 10/11/2016  . Cerebral aneurysm 2000 06/27/2014  . Chest pain 10/30/2014  . CHF (congestive heart failure) ([place])   . Chronic anticoagulation 06/2014   Coumadin started after bilateral PE  . Chronic kidney disease, stage III (moderate) ([place]) 11/21/2014  . Clotting disorder ([place])    right was removed, still have left cataract  . Diabetes ([place])   . Dizziness and giddiness 11/28/2014  . DVT of lower extremity (deep venous thrombosis) ([place]) 11/28/2014  . Dysuria 07/07/2014  . Elevated troponin 11/28/2014  . Fatty liver   . Gastritis 05/16/2012   Chronic dyspepsia on PPI daily.   Marland Kitchen Headache, acute 06/08/2010   Qualifier: Diagnosis of  By:  [place] MD, Heinz Knuckles   . Heart murmur   . Hematochezia 11/18/2015  . History of fibromuscular dysplasia 06/27/2014  . HTN (hypertension)    Essential  . Hyperlipidemia   . Hypertrophic obstructive cardiomyopathy (HOCM) ([place]) 05/20/2008   Qualifier: Diagnosis of  By: Linda Hedges MD, Heinz Knuckles   . Kidney stones   . LVH (left ventricular hypertrophy)   . Orthostatic hypotension 11/28/2014  . PAF (paroxysmal atrial fibrillation) ([place]) 2000  . Palpitations 11/28/2014  . PAROXYSMAL  ATRIAL FIBRILLATION 05/20/2008   Qualifier: Diagnosis of  By: Linda Hedges MD, Heinz Knuckles   . PE (pulmonary embolism) 06/2014   Bilateral  . Postmenopausal osteoporosis 04/2019   T score -3.3  . Pulmonary embolism, bilateral ([place]) 06/27/2014  . SOB (shortness of breath) 06/27/2014  . Subarachnoid hemorrhage ([place]) 2000  . UTI (urinary tract infection) 01/04/2015    Current Medications: Current Meds  Medication Sig  . acetaminophen (TYLENOL) 500 MG tablet Take 1,000 mg by mouth every 6 (six) hours as needed for moderate pain or headache.  Marland Kitchen alum & mag hydroxide-simeth (MAALOX/MYLANTA) 200-200-20 MG/5ML suspension Take 15 mLs by mouth every 4 (four) hours as needed for indigestion or heartburn.  Marland Kitchen amLODipine (NORVASC) 5 MG tablet TAKE 1 TABLET BY MOUTH 2 TIMES A DAY  . atorvastatin (LIPITOR) 20 MG tablet TAKE 1 TABLET (20 MG TOTAL) BY MOUTH DAILY AT 6 PM.  . disopyramide (NORPACE) 150 MG capsule TAKE 1 CAPSULE (150 MG TOTAL) BY MOUTH 2 (TWO) TIMES DAILY.  Marland Kitchen lansoprazole (PREVACID) 30 MG capsule TAKE 1 CAPSULE (30 MG TOTAL) BY MOUTH DAILY AT 12 NOON.  Marland Kitchen lidocaine (LIDODERM) 5 % Place 1 patch onto the skin daily.  . metFORMIN (GLUCOPHAGE) 500 MG tablet TAKE 1 TABLET (500 MG TOTAL) BY MOUTH DAILY WITH BREAKFAST.  . metoprolol tartrate (LOPRESSOR) 50 MG tablet Take 1 tablet (50 mg total) by mouth 2 (two) times daily.  . mirtazapine (REMERON) 15 MG tablet TAKE 1 TABLET BY MOUTH EVERYDAY AT BEDTIME  . nitroGLYCERIN (NITROSTAT) 0.4 MG SL tablet PLACE 1 TABLET (0.4 MG TOTAL) UNDER THE TONGUE EVERY 5 (FIVE) MINUTES AS NEEDED FOR CHEST PAIN.  Marland Kitchen pantoprazole (PROTONIX) 40 MG tablet TAKE 1 TABLET BY MOUTH EVERY DAY  . trospium (SANCTURA) 20 MG tablet Take 1 tablet (20 mg total) by mouth 2 (two) times daily.  Alveda Reasons 15 MG TABS tablet TAKE 1 TABLET BY MOUTH EVERY DAY WITH SUPPER     Allergies:   Patient has no known allergies.   Social History   Tobacco Use  . Smoking status: Former Smoker    Quit date:  09/06/1967    Years since quitting: 53.2  . Smokeless tobacco: Never Used  Vaping Use  . Vaping Use: Never used  Substance Use Topics  . Alcohol use: No    Alcohol/week: 0.0 standard drinks  . Drug use: No     Family Hx: The patient's family history includes Arrhythmia in her mother and sister; Breast cancer (age of onset: 32) in her daughter; Breast cancer (age of onset: 53) in her mother; Cervical cancer in her sister; Colon cancer in her sister; Heart disease in her mother; Stroke in her mother. There is no history of Esophageal cancer, Pancreatic cancer, Rectal cancer, or Stomach cancer.  ROS   EKGs/Labs/Other Test Reviewed:    EKG:  EKG is   ordered today.  The ekg ordered today demonstrates sinus bradycardia, HR 58, LAD, TW inversions in 1, aVL, V1-2, QTc 467,  no change from prior tracing.   Recent Labs: 01/02/2020: Magnesium 2.0 10/26/2020: ALT 10; BUN 25; Creatinine, Ser 1.24; Hemoglobin 13.1; Platelets 194.0; Potassium 4.4; Sodium 143   Recent Lipid Panel Lab Results  Component Value Date/Time   CHOL 121 04/17/2020 02:51 PM   CHOL 140 11/11/2016 09:00 AM   TRIG 122 04/17/2020 02:51 PM   HDL 49 (L) 04/17/2020 02:51 PM   HDL 51 11/11/2016 09:00 AM   CHOLHDL 2.5 04/17/2020 02:51 PM   LDLCALC 52 04/17/2020 02:51 PM      Risk Assessment/Calculations:    CHA2DS2-VASc Score = 8  This indicates a 10.8% annual risk of stroke. The patient's score is based upon: CHF History: No HTN History: Yes Diabetes History: Yes Stroke History: Yes Vascular Disease History: Yes     Physical Exam:    VS:  BP 122/72   Pulse (!) 58   Ht 5\' 6"  (1.676 m)   Wt 136 lb 6.4 oz (61.9 kg)   SpO2 97%   BMI 22.02 kg/m     Wt Readings from Last 3 Encounters:  11/18/20 136 lb 6.4 oz (61.9 kg)  10/26/20 136 lb 12.8 oz (62.1 kg)  10/06/20 138 lb 3.2 oz (62.7 kg)     Constitutional:      Appearance: Healthy appearance. Not in distress.  Neck:     Vascular: JVD normal.   Lymphadenopathy:      Upper Body:     Left upper body: No axillary adenopathy.  Pulmonary:     Effort: Pulmonary effort is normal.     Breath sounds: No wheezing. No rales.  Chest:     Chest wall: Not tender to palpatation.  Breasts:     Left: No axillary adenopathy.    Cardiovascular:     Normal rate. Regular rhythm. Normal S1. Normal S2.     Murmurs: There is a grade 1/6 early systolic murmur at the URSB.  Edema:    Peripheral edema absent.  Abdominal:     Palpations: Abdomen is soft. There is no hepatomegaly.  Skin:    General: Skin is warm and dry.  Neurological:     Mental Status: Alert and oriented to person, place and time.     Cranial Nerves: Cranial nerves are intact.       ASSESSMENT & PLAN:    1. Precordial pain 2. HOCM (hypertrophic obstructive cardiomyopathy) ([place]) 3. Thoracic aortic aneurysm without rupture ([place]) She presents with left-sided and substernal chest discomfort over the past several weeks.  Her electrocardiogram does not demonstrate any significant changes.  Her high-sensitivity troponin at primary care last month was normal.  She had a cardiac catheterization in 2009 that showed no CAD.  She has been seen over the years with recurrent chest pain.  Her last nuclear stress test was in December 2020 and demonstrated normal perfusion.  She does have an ascending thoracic aortic aneurysm.  This was measured at 4 cm by CT in 4/21.  She is due for a follow-up CT in April.  She also has a history of hypertrophic obstructive cardiomyopathy and her most recent echocardiogram 1/21 demonstrated no SAM and her intracavitary gradient was 5 mmHg.  Her symptoms are somewhat typical for cardiac pain as she has substernal pain and sometimes has associated L arm pain.  I recommend proceeding with a stress MRI to further evaluate her symptoms and rule out ischemia, follow-up on her hypertrophic obstructive cardiomyopathy and get an idea of how large her ascending thoracic  aortic aneurysm is now.   -Stress MRI  -F/u 4-6 weeks   4. Paroxysmal atrial fibrillation (HCC) Maintaining sinus rhythm.  Continue current management which includes disopyramide, metoprolol tartrate, rivaroxaban.  5. PSVT (paroxysmal supraventricular tachycardia) (HCC) She has occasional palpitations but no sustained arrhythmias.  Continue current management.  6. Essential hypertension The patient's blood pressure is controlled on her current regimen.  Continue current therapy.     Shared Decision Making/Informed Consent The risks [chest pain, shortness of breath, cardiac arrhythmias, dizziness, blood pressure fluctuations, myocardial infarction, stroke/transient ischemic attack, nausea, vomiting, allergic reaction (estimated to be 1 in 10,000)], benefits (risk stratification, diagnosing coronary artery disease, treatment guidance) and alternatives of a stress MRI were discussed in detail with Ms. Borak and she agrees to proceed.   Dispo:  Return in about 6 weeks (around 12/30/2020) for Routine follow 4-6 week follow up in person with Dr. Harrington Challenger or Richardson Dopp, PA..   Medication Adjustments/Labs and Tests Ordered: Current medicines are reviewed at length with the patient today.  Concerns regarding medicines are outlined above.  Tests Ordered: Orders Placed This Encounter  Procedures  . MR CARDIAC STRESS TEST  . Cardiac Stress Test: Informed Consent Details: Physician/Practitioner Attestation; Transcribe to consent form and obtain patient signature  . EKG 12-Lead   Medication Changes: No orders of the defined types were placed in this encounter.   Signed, Richardson Dopp, PA-C  11/18/2020 3:34 PM    [facility] [place], [place], [place]  [address] Phone: [phone]; Fax: [phone]

## 2020-11-18 NOTE — Patient Instructions (Signed)
Medication Instructions:  Your physician recommends that you continue on your current medications as directed. Please refer to the Current Medication list given to you today.  *If you need a refill on your cardiac medications before your next appointment, please call your pharmacy*   Lab Work: -None  If you have labs (blood work) drawn today and your tests are completely normal, you will receive your results only by: Marland Kitchen MyChart Message (if you have MyChart) OR . A paper copy in the mail If you have any lab test that is abnormal or we need to change your treatment, we will call you to review the results.   Testing/Procedures: Stress MRI - a nurse navigator will call you from the office to set you up and go over instructions.    Follow-Up: At [facility], you and your health needs are our priority.  As part of our continuing mission to provide you with exceptional heart care, we have created designated Provider Care Teams.  These Care Teams include your primary Cardiologist (physician) and Advanced Practice Providers (APPs -  Physician Assistants and Nurse Practitioners) who all work together to provide you with the care you need, when you need it.  We recommend signing up for the patient portal called "MyChart".  Sign up information is provided on this After Visit Summary.  MyChart is used to connect with patients for Virtual Visits (Telemedicine).  Patients are able to view lab/test results, encounter notes, upcoming appointments, etc.  Non-urgent messages can be sent to your provider as well.   To learn more about what you can do with MyChart, go to [email or web address].    Your next appointment:   6 week(s)  The format for your next appointment:   In Person  Provider:   You may see Dorris Carnes, MD or one of the following Advanced Practice Providers on your designated Care Team:    Richardson Dopp, [place]

## 2020-11-23 ENCOUNTER — Telehealth: Payer: Self-pay | Admitting: Physician Assistant

## 2020-11-23 ENCOUNTER — Telehealth: Payer: Self-pay

## 2020-11-23 NOTE — Telephone Encounter (Signed)
Monique [name] is a 85 y.o. female was called and contacted re: message below.  Pt said she has no transportation at this time and will call when she can schedule a ride to the office.

## 2020-11-23 NOTE — Telephone Encounter (Signed)
Spoke with patient regarding preferred weekdays and times for scheduling the MR Cardiac Stress ordered by Richardson Dopp, PA.  Informed patient as soon as we heart regarding the insurance prior authorization, I will be in touch with her appointment.  Patient voiced her understanding.

## 2020-11-23 NOTE — Telephone Encounter (Signed)
-----   Message from Theone Murdoch sent at 11/23/2020  1:28 PM EDT ----- Regarding: FW: schedule follow up This patient needs to schedule a medication follow up in order to get her rx refilled as she requested. See if she can come in this week for appt. Thanks. ----- Message ----- From: Jaquita Folds, MD Sent: 11/17/2020   3:24 PM EDT To: Theone Murdoch Subject: schedule follow up                             Hi Alycia,   Can you schedule a follow up with this patient? I received a request for medication refill but need her to come back before refilling.   Thanks!

## 2020-11-24 ENCOUNTER — Other Ambulatory Visit: Payer: Self-pay | Admitting: *Deleted

## 2020-11-24 DIAGNOSIS — I712 Thoracic aortic aneurysm, without rupture, unspecified: Secondary | ICD-10-CM

## 2020-11-25 NOTE — Telephone Encounter (Signed)
Follow up:     Patient was returning a call back concering a CT

## 2020-11-30 NOTE — Telephone Encounter (Signed)
Pt is scheduled for 12/31/2020.

## 2020-12-01 ENCOUNTER — Telehealth: Payer: Self-pay | Admitting: Physician Assistant

## 2020-12-01 NOTE — Telephone Encounter (Signed)
Called to discuss the Thursday 12/31/20 2:00pm Cardiac Stress test at [facility]---arrival time is 1:00pm 1st floor admissions office for check in---line was busy--will continue to try and reach patient se we can discuss.

## 2020-12-02 NOTE — Telephone Encounter (Signed)
Spoke with patient regarding the Thursday 12/31/20 2:00pm Cardiac Stress appointment at [facility]---arrival time is 1:00pm 1st floor admissions office.  Patient states that the MRI department called and informed her of the appontment.

## 2020-12-02 NOTE — Telephone Encounter (Signed)
Left message for patient to call and discuss the scheduled appointment for the Cardiac Stress test ordered by Richardson Dopp, PA

## 2020-12-03 ENCOUNTER — Other Ambulatory Visit: Payer: Self-pay

## 2020-12-03 ENCOUNTER — Encounter: Payer: Self-pay | Admitting: Internal Medicine

## 2020-12-03 ENCOUNTER — Ambulatory Visit (INDEPENDENT_AMBULATORY_CARE_PROVIDER_SITE_OTHER): Payer: Medicare Other | Admitting: Internal Medicine

## 2020-12-03 VITALS — BP 118/78 | HR 56 | Temp 97.7°F | Ht 66.0 in | Wt 134.0 lb

## 2020-12-03 DIAGNOSIS — R350 Frequency of micturition: Secondary | ICD-10-CM

## 2020-12-03 MED ORDER — NITROFURANTOIN MONOHYD MACRO 100 MG PO CAPS: 100.0000 mg | ORAL_CAPSULE | Freq: Two times a day (BID) | ORAL | 0 refills | Status: DC

## 2020-12-03 NOTE — Assessment & Plan Note (Signed)
Patient unable to give sample during visit. Ordered U/A and culture. Starting macrobid for presumptive cystitis.

## 2020-12-03 NOTE — Patient Instructions (Signed)
We have sent in macrobid to take for urine infection 1 pill twice a day for 1 week.   We have given you a sample cup to bring back to the lab.

## 2020-12-03 NOTE — Progress Notes (Signed)
   Subjective:   Patient ID: Monique [name], female    DOB: 11/28/1935, 85 y.o.   MRN: [id number]  HPI The patient is an 85 YO female coming in for frequency with urination and mild low abdomen pain. Denies pain with urination but she feels as though she has to go again right away. Denies constipation or diarrhea. Denies N/V. Denies back pain.   Review of Systems  Constitutional: Negative.   Respiratory: Negative.   Cardiovascular: Negative.   Gastrointestinal: Positive for abdominal pain. Negative for abdominal distention, constipation, diarrhea, nausea and vomiting.  Genitourinary: Positive for frequency and urgency. Negative for dysuria.  Musculoskeletal: Negative.   Skin: Negative.     Objective:  Physical Exam Constitutional:      Appearance: She is well-developed.  HENT:     Head: Normocephalic and atraumatic.  Cardiovascular:     Rate and Rhythm: Normal rate and regular rhythm.  Pulmonary:     Effort: Pulmonary effort is normal. No respiratory distress.     Breath sounds: Normal breath sounds. No wheezing or rales.  Abdominal:     General: Bowel sounds are normal. There is no distension.     Palpations: Abdomen is soft.     Tenderness: There is abdominal tenderness. There is no rebound.  Musculoskeletal:     Cervical back: Normal range of motion.  Skin:    General: Skin is warm and dry.  Neurological:     Mental Status: She is alert and oriented to person, place, and time.     Coordination: Coordination normal.     Vitals:   12/03/20 1333  BP: 118/78  Pulse: (!) 56  Temp: 97.7 F (36.5 C)  TempSrc: Axillary  SpO2: 98%  Weight: 134 lb (60.8 kg)  Height: 5\' 6"  (1.676 m)    This visit occurred during the SARS-CoV-2 public health emergency.  Safety protocols were in place, including screening questions prior to the visit, additional usage of staff PPE, and extensive cleaning of exam room while observing appropriate contact time as indicated for disinfecting  solutions.   Assessment & Plan:

## 2020-12-04 ENCOUNTER — Ambulatory Visit (INDEPENDENT_AMBULATORY_CARE_PROVIDER_SITE_OTHER): Payer: Medicare Other | Admitting: Pharmacist

## 2020-12-04 DIAGNOSIS — E785 Hyperlipidemia, unspecified: Secondary | ICD-10-CM

## 2020-12-04 DIAGNOSIS — I480 Paroxysmal atrial fibrillation: Secondary | ICD-10-CM | POA: Diagnosis not present

## 2020-12-04 DIAGNOSIS — E1169 Type 2 diabetes mellitus with other specified complication: Secondary | ICD-10-CM

## 2020-12-04 DIAGNOSIS — N1832 Chronic kidney disease, stage 3b: Secondary | ICD-10-CM

## 2020-12-04 DIAGNOSIS — I10 Essential (primary) hypertension: Secondary | ICD-10-CM

## 2020-12-04 DIAGNOSIS — N3281 Overactive bladder: Secondary | ICD-10-CM

## 2020-12-04 DIAGNOSIS — R350 Frequency of micturition: Secondary | ICD-10-CM | POA: Diagnosis not present

## 2020-12-04 LAB — URINALYSIS, ROUTINE W REFLEX MICROSCOPIC
Bilirubin Urine: NEGATIVE
Hgb urine dipstick: NEGATIVE
Ketones, ur: NEGATIVE
Leukocytes,Ua: NEGATIVE
Nitrite: NEGATIVE
RBC / HPF: NONE SEEN (ref 0–?)
Specific Gravity, Urine: 1.015 (ref 1.000–1.030)
Total Protein, Urine: NEGATIVE
Urine Glucose: NEGATIVE
Urobilinogen, UA: 0.2 (ref 0.0–1.0)
pH: 6.5 (ref 5.0–8.0)

## 2020-12-04 NOTE — Addendum Note (Signed)
Addended by: Jacob Moores on: 12/04/2020 12:56 PM   Modules accepted: Orders

## 2020-12-04 NOTE — Patient Instructions (Signed)
Visit Information  Phone number for Pharmacist: [phone]  Goals Addressed   None    Patient Care Plan: CCM Pharmacy Care Plan    Problem Identified: Hypertension, Hyperlipidemia, Diabetes, Atrial Fibrillation, GERD and Overactive Bladder, Headaches   Priority: High    Long-Range Goal: Disease management   Start Date: 12/04/2020  Expected End Date: 12/04/2021  This Visit's Progress: On track  Priority: High  Note:   Current Barriers:  . Unable to independently monitor therapeutic efficacy  Pharmacist Clinical Goal(s):  Marland Kitchen Patient will achieve adherence to monitoring guidelines and medication adherence to achieve therapeutic efficacy through collaboration with PharmD and provider.   Interventions: . 1:1 collaboration with Hoyt Koch, MD regarding development and update of comprehensive plan of care as evidenced by provider attestation and co-signature . Inter-disciplinary care team collaboration (see longitudinal plan of care) . Comprehensive medication review performed; medication list updated in electronic medical record   Hypertension    BP goal is:  <130/80 Patient checks BP at home daily Patient home BP readings are ranging: 115/70s - 120s/80s, HR 50s   Patient has failed these meds in the past: n/a Patient is currently controlled on the following medications:   Amlodipine 5 mg BID  Metoprolol tartrate 50 mg BID   We discussed: BP controlled and HR on low side of normal; counseled on importance of home monitoring   Plan Continue current medications  Continue to monitor BP/HR at home   AFIB    Patient is currently rate controlled.  Patient has failed these meds in past: n/a Patient is currently controlled on the following medications:   Metoprolol tartrate 50 mg BID  Disopyramide 150 mg BID  Xarelto 15 mg daily PM   We discussed: importance of compliance with Xarelto   Plan: Continue current medications   Hyperlipidemia    LDL goal <  70 Cath 2009 no CAD. Myoview 2020 low risk.  Patient has failed these meds in past: n/a Patient is currently controlled on the following medications:   Atorvastatin 20 mg daily HS  Nitroglycerin 0.4 mg SL prn   We discussed: cholesterol is at goal; pt has cardiac MRI this month to evaluate recent chest pain and cardiac risk   Plan: Continue current medications   Diabetes    A1c goal <7% Checking BG: Never   Patient has failed these meds in past: n/a Patient is currently controlled on the following medications:  Metformin 500 mg daily AM   We discussed: A1c goal; benefits of metformin   Plan: Continue current medications   GERD    Patient has failed these meds in past: lansoprazole Patient is currently controlled on the following medications:   Pantoprazole 40 mg HS   Maalox PRN   We discussed:  Pt has active rx's for both pantoprazole and lansoprazole; pt reports she is only taking pantoprazole now; she denies issues with GERD; advised pt lansoprazole and pantoprazole should not be taken together d/t duplicate therapy   Plan: Continue current medications   Nocturia    Patient has failed these meds in past: Myrbetriq Patient is currently uncontrolled on the following medications:   Trospium 20 mg BID (Good rx)   We discussed:  Pt denies improvement in symptoms since starting trospium; she did take it for over 2 months which is an adequate trial; advised pt she can stop taking it if truly no benefit. Per gynecology notes, pt is not a candidate for other anticholinergic medications due to age and risk  for dementia   Plan: Avoid drinking fluids after 6pm Follow up with urogynecology as needed   Headache    Patient has failed these meds in past: sumatriptan Patient is currently uncontrolled on the following medications:   Tylenol 500 mg PRN   We discussed:  Pt has seen neurology for headaches, advised topiramate or CGRP antagonists but pt declined additional  meds at the time;    Plan Continue current medications  Consider topiramate or CGRP antagonist in future if needed  Patient Goals/Self-Care Activities . Patient will:  - take medications as prescribed focus on medication adherence by pill box check blood pressure daily, document, and provide at future appointments  -Keep MRI appt and follow up with cardiology as scheduled -Follow up with gynecologist if needed for bladder issues  Follow Up Plan: Telephone follow up appointment with care management team member scheduled for: 1 year      The patient verbalized understanding of instructions, educational materials, and care plan provided today and declined offer to receive copy of patient instructions, educational materials, and care plan.  Telephone follow up appointment with pharmacy team member scheduled for:1 year  Charlene Brooke, PharmD, [place], CPP Clinical Pharmacist [facility] [phone]

## 2020-12-04 NOTE — Progress Notes (Signed)
Chronic Care Management Pharmacy Note  12/04/2020 Name:  Monique [name] MRN:  [id number] DOB:  [birth year]  Subjective: Monique [name] is an 85 y.o. year old female who is a primary patient of Hoyt Koch, MD.  The CCM team was consulted for assistance with disease management and care coordination needs.    Engaged with patient by telephone for follow up visit in response to provider referral for pharmacy case management and/or care coordination services.   Consent to Services:  The patient was given the following information about Chronic Care Management services today, agreed to services, and gave verbal consent: 1. CCM service includes personalized support from designated clinical staff supervised by the primary care provider, including individualized plan of care and coordination with other care providers 2. 24/7 contact phone numbers for assistance for urgent and routine care needs. 3. Service will only be billed when office clinical staff spend 20 minutes or more in a month to coordinate care. 4. Only one practitioner may furnish and bill the service in a calendar month. 5.The patient may stop CCM services at any time (effective at the end of the month) by phone call to the office staff. 6. The patient will be responsible for cost sharing (co-pay) of up to 20% of the service fee (after annual deductible is met). Patient agreed to services and consent obtained.  Patient Care Team: Hoyt Koch, MD as PCP - General (Internal Medicine) Fay Records, MD as PCP - Cardiology (Cardiology) Fay Records, MD (Cardiology) Charlton Haws, [facility] as Pharmacist (Pharmacist)  Recent office visits: 12/03/20 Dr Sharlet Salina OV: UTI, rx'd macrobid. Unable to provide sample, given sample cup to bring back.  10/26/20 NP Jodi Mourning OV: c/o palpitations, chest tightness. EKG NSR, troponin normal. Referred back to cardiology  06/19/20 NP Jodi Mourning OV: c/o chronic headaches, OAB.  Referred to neurology, urogynecology.  Recent consult visits: 11/18/20 Richardson Dopp (cardiology): f/u chest pain. Rec'd stress MRI (scheduled for 4/28)  10/06/20 Dr Leta Baptist (neurology): new consult for headaches. Recent CT no acute changes. Dx tension vs migraine. Considered topamax vs CGRP, pt declined additional meds. Consider gabapentin or duloxetine for neuropathy.  08/14/20 Dr Wannetta Sender (OB/GYN): f/u OAB/nocturia, started myrbetriq and fiber supplement last visit. No improvement. Changed to trospium 20 mg BID (Good rx $17). Not a candidate for other anticholinergic meds d/t age/risk for dementia.  Hospital visits: None in previous 6 months  Objective:  Lab Results  Component Value Date   CREATININE 1.24 (H) 10/26/2020   BUN 25 (H) 10/26/2020   GFR 40.50 (L) 10/26/2020   GFRNONAA 43 (L) 08/22/2019   GFRAA 50 (L) 08/22/2019   NA 143 10/26/2020   K 4.4 10/26/2020   CALCIUM 10.0 10/26/2020   CO2 29 10/26/2020   GLUCOSE 81 10/26/2020    Lab Results  Component Value Date/Time   HGBA1C 6.8 (H) 10/26/2020 02:57 PM   HGBA1C 6.7 (H) 04/17/2020 02:51 PM   GFR 40.50 (L) 10/26/2020 02:57 PM   GFR 47.47 (L) 01/02/2020 10:55 AM   MICROALBUR 80 02/12/2019 02:30 PM    Last diabetic Eye exam:  Lab Results  Component Value Date/Time   HMDIABEYEEXA No Retinopathy 11/21/2017 12:00 AM    Last diabetic Foot exam: No results found for: HMDIABFOOTEX   Lab Results  Component Value Date   CHOL 121 04/17/2020   HDL 49 (L) 04/17/2020   LDLCALC 52 04/17/2020   TRIG 122 04/17/2020   CHOLHDL 2.5 04/17/2020  Hepatic Function Latest Ref Rng & Units 10/26/2020 04/17/2020 01/02/2020  Total Protein 6.0 - 8.3 g/dL 7.0 6.5 6.8  Albumin 3.5 - 5.2 g/dL 4.1 - 4.0  AST 0 - 37 U/L _0 ALT 0 - 35 U/L _1 Alk Phosphatase 39 - 117 U/L 100 - 82  Total Bilirubin 0.2 - 1.2 mg/dL 0.6 0.4 0.7  Bilirubin, Direct 0.0 - 0.3 mg/dL - - -    Lab Results  Component Value Date/Time   TSH 2.660  04/29/2019 04:54 PM   TSH 2.829 09/08/2017 12:23 AM   TSH 0.91 11/20/2014 05:18 PM   FREET4 0.97 11/20/2014 05:18 PM    CBC Latest Ref Rng & Units 10/26/2020 04/17/2020 01/02/2020  WBC 4.0 - 10.5 K/uL 9.2 8.2 7.5  Hemoglobin 12.0 - 15.0 g/dL 13.1 12.9 12.8  Hematocrit 36.0 - 46.0 % 39.4 40.3 38.6  Platelets 150.0 - 400.0 K/uL 194.0 189 178.0    No results found for: VD25OH  Clinical ASCVD: No  The ASCVD Risk score Mikey Bussing DC Jr., et al., 2013) failed to calculate for the following reasons:   The 2013 ASCVD risk score is only valid for ages 65 to 9    Depression screen PHQ 2/9 04/17/2020 02/12/2019 07/16/2018  Decreased Interest 0 0 0  Down, Depressed, Hopeless 0 0 0  PHQ - 2 Score 0 0 0  Some recent data might be hidden    CHA2DS2-VASc Score = 8  The patient's score is based upon: CHF History: No HTN History: Yes Diabetes History: Yes Stroke History: Yes Vascular Disease History: Yes     Social History   Tobacco Use  Smoking Status Former Smoker  . Quit date: 09/06/1967  . Years since quitting: 53.2  Smokeless Tobacco Never Used   BP Readings from Last 3 Encounters:  12/03/20 118/78  11/18/20 122/72  10/26/20 138/90   Pulse Readings from Last 3 Encounters:  12/03/20 (!) 56  11/18/20 (!) 58  10/26/20 71   Wt Readings from Last 3 Encounters:  12/03/20 134 lb (60.8 kg)  11/18/20 136 lb 6.4 oz (61.9 kg)  10/26/20 136 lb 12.8 oz (62.1 kg)   BMI Readings from Last 3 Encounters:  12/03/20 21.63 kg/m  11/18/20 22.02 kg/m  10/26/20 22.08 kg/m    Assessment/Interventions: Review of patient past medical history, allergies, medications, health status, including review of consultants reports, laboratory and other test data, was performed as part of comprehensive evaluation and provision of chronic care management services.   SDOH:  (Social Determinants of Health) assessments and interventions performed: Yes  SDOH Screenings   Alcohol Screen: Not on file  Depression  (PHQ2-9): Low Risk   . PHQ-2 Score: 0  Financial Resource Strain: Low Risk   . Difficulty of Paying Living Expenses: Not very hard  Food Insecurity: Not on file  Housing: Not on file  Physical Activity: Not on file  Social Connections: Not on file  Stress: Not on file  Tobacco Use: Medium Risk  . Smoking Tobacco Use: Former Smoker  . Smokeless Tobacco Use: Never Used  Transportation Needs: Not on file    [place]  No Known Allergies  Medications Reviewed Today    Reviewed by Charlton Haws, [facility] (Pharmacist) on 12/04/20 at 0950  Med List Status: <None>  Medication Order Taking? Sig Documenting Provider Last Dose Status Informant  acetaminophen (TYLENOL) 500 MG tablet [id number] Yes Take 1,000 mg by mouth every 6 (six) hours as needed for  moderate pain or headache. [provider] Taking Active Self           Med Note Briant Cedar   Wed Nov 18, 2020  2:24 PM)    alum & mag hydroxide-simeth (MAALOX/MYLANTA) 200-200-20 MG/5ML suspension [id number] Yes Take 15 mLs by mouth every 4 (four) hours as needed for indigestion or heartburn. Aline August, MD Taking Active   amLODipine (NORVASC) 5 MG tablet [id number] Yes TAKE 1 TABLET BY MOUTH 2 TIMES A DAY Fay Records, MD Taking Active   atorvastatin (LIPITOR) 20 MG tablet [id number] Yes TAKE 1 TABLET (20 MG TOTAL) BY MOUTH DAILY AT 6 PM. Hoyt Koch, MD Taking Active   disopyramide (NORPACE) 150 MG capsule [id number] Yes TAKE 1 CAPSULE (150 MG TOTAL) BY MOUTH 2 (TWO) TIMES DAILY. Fay Records, MD Taking Active   lidocaine (LIDODERM) 5 % [id number] Yes Place 1 patch onto the skin daily. Hoyt Koch, MD Taking Active            Med Note Briant Cedar   Wed Nov 18, 2020  2:24 PM)    metFORMIN (GLUCOPHAGE) 500 MG tablet [id number] Yes TAKE 1 TABLET (500 MG TOTAL) BY MOUTH DAILY WITH BREAKFAST. Hoyt Koch, MD Taking Active   metoprolol tartrate (LOPRESSOR) 50 MG tablet [id number] Yes Take 1  tablet (50 mg total) by mouth 2 (two) times daily. Fay Records, MD Taking Active         Discontinued 08/14/20 0929   mirtazapine (REMERON) 15 MG tablet [id number] Yes TAKE 1 TABLET BY MOUTH EVERYDAY AT BEDTIME Hoyt Koch, MD Taking Active   nitrofurantoin, macrocrystal-monohydrate, (MACROBID) 100 MG capsule [id number] Yes Take 1 capsule (100 mg total) by mouth 2 (two) times daily. Hoyt Koch, MD Taking Active   nitroGLYCERIN (NITROSTAT) 0.4 MG SL tablet [id number] Yes PLACE 1 TABLET (0.4 MG TOTAL) UNDER THE TONGUE EVERY 5 (FIVE) MINUTES AS NEEDED FOR CHEST PAIN. [place], Crista Luria, [place] Taking Active   pantoprazole (PROTONIX) 40 MG tablet [id number] Yes TAKE 1 TABLET BY MOUTH EVERY DAY Hoyt Koch, MD Taking Active   trospium (SANCTURA) 20 MG tablet [id number] Yes Take 1 tablet (20 mg total) by mouth 2 (two) times daily. Jaquita Folds, MD Taking Active   XARELTO 15 MG TABS tablet [id number] Yes TAKE 1 TABLET BY MOUTH EVERY DAY WITH SUPPER Fay Records, MD Taking Active           Patient Active Problem List   Diagnosis Date Noted  . Muscle spasm 01/02/2020  . Thoracic aortic aneurysm without rupture ([place]) 12/11/2019  . PSVT (paroxysmal supraventricular tachycardia) ([place]) 08/22/2019  . Chest pain 08/21/2019  . Blood in stool 01/04/2018  . Diabetes mellitus type 2, controlled, without complications ([place]) 81/09/7508  . Allergic rhinitis 06/20/2017  . Headache 10/23/2015  . Frequent urination 05/06/2015  . Chronic RLQ pain 04/24/2015  . UTI (urinary tract infection) 01/04/2015  . Chest tightness 11/28/2014  . Chronic kidney disease, stage III (moderate) ([place]) 11/21/2014  . Pulmonary embolism, bilateral ([place]) 06/27/2014  . Cerebral aneurysm 2000 06/27/2014  . Hyperlipidemia associated with type 2 diabetes mellitus ([place]) 05/20/2008  . Essential hypertension 05/20/2008  . Hypertrophic obstructive cardiomyopathy (HOCM) ([place]) 05/20/2008  . PAROXYSMAL ATRIAL  FIBRILLATION 05/20/2008  . Subarachnoid hemorrhage 2000 05/20/2008    Immunization History  Administered Date(s) Administered  . Fluad Quad(high Dose 65+) 07/16/2019, 06/19/2020  . Influenza Split 05/15/2012  . Influenza Whole 09/03/2009  . Influenza, High Dose  Seasonal PF 10/11/2016, 06/20/2017, 07/16/2018  . Influenza,inj,Quad PF,6+ Mos 06/28/2014, 09/17/2015  . PFIZER(Purple Top)SARS-COV-2 Vaccination 11/10/2019, 12/09/2019, 08/27/2020  . Pneumococcal Conjugate-13 04/24/2015  . Pneumococcal Polysaccharide-23 05/15/2012  . Td 11/08/2002  . Tdap 06/20/2017  . Zoster 04/09/2013    Conditions to be addressed/monitored:  Hypertension, Hyperlipidemia, Diabetes, Atrial Fibrillation, GERD and Overactive Bladder, Headaches  Care Plan : [place]  Updates made by Charlton Haws, [place] since 12/04/2020 12:00 AM    Problem: Hypertension, Hyperlipidemia, Diabetes, Atrial Fibrillation, GERD and Overactive Bladder, Headaches   Priority: High    Long-Range Goal: Disease management   Start Date: 12/04/2020  Expected End Date: 12/04/2021  This Visit's Progress: On track  Priority: High  Note:   Current Barriers:  . Unable to independently monitor therapeutic efficacy  Pharmacist Clinical Goal(s):  Marland Kitchen Patient will achieve adherence to monitoring guidelines and medication adherence to achieve therapeutic efficacy through collaboration with PharmD and provider.   Interventions: . 1:1 collaboration with Hoyt Koch, MD regarding development and update of comprehensive plan of care as evidenced by provider attestation and co-signature . Inter-disciplinary care team collaboration (see longitudinal plan of care) . Comprehensive medication review performed; medication list updated in electronic medical record   Hypertension    BP goal is:  <130/80 Patient checks BP at home daily Patient home BP readings are ranging: 115/70s - 120s/80s, HR 50s   Patient has failed these  meds in the past: n/a Patient is currently controlled on the following medications:   Amlodipine 5 mg BID  Metoprolol tartrate 50 mg BID   We discussed: BP controlled and HR on low side of normal; counseled on importance of home monitoring   Plan Continue current medications  Continue to monitor BP/HR at home   AFIB    Patient is currently rate controlled.  Patient has failed these meds in past: n/a Patient is currently controlled on the following medications:   Metoprolol tartrate 50 mg BID  Disopyramide 150 mg BID  Xarelto 15 mg daily PM   We discussed: importance of compliance with Xarelto   Plan: Continue current medications   Hyperlipidemia    LDL goal < 70 Cath 2009 no CAD. Myoview 2020 low risk.  Patient has failed these meds in past: n/a Patient is currently controlled on the following medications:   Atorvastatin 20 mg daily HS  Nitroglycerin 0.4 mg SL prn   We discussed: cholesterol is at goal; pt has cardiac MRI this month to evaluate recent chest pain and cardiac risk   Plan: Continue current medications   Diabetes    A1c goal <7% Checking BG: Never   Patient has failed these meds in past: n/a Patient is currently controlled on the following medications:  Metformin 500 mg daily AM   We discussed: A1c goal; benefits of metformin   Plan: Continue current medications   GERD    Patient has failed these meds in past: lansoprazole Patient is currently controlled on the following medications:   Pantoprazole 40 mg HS   Maalox PRN   We discussed:  Pt has active rx's for both pantoprazole and lansoprazole; pt reports she is only taking pantoprazole now; she denies issues with GERD; advised pt lansoprazole and pantoprazole should not be taken together d/t duplicate therapy   Plan: Continue current medications   Nocturia    Patient has failed these meds in past: Myrbetriq Patient is currently uncontrolled on the following medications:    Trospium 20 mg BID (  Good rx)   We discussed:  Pt denies improvement in symptoms since starting trospium; she did take it for over 2 months which is an adequate trial; advised pt she can stop taking it if truly no benefit. Per gynecology notes, pt is not a candidate for other anticholinergic medications due to age and risk for dementia   Plan: Avoid drinking fluids after 6pm Follow up with urogynecology as needed   Headache    Patient has failed these meds in past: sumatriptan Patient is currently uncontrolled on the following medications:   Tylenol 500 mg PRN   We discussed:  Pt has seen neurology for headaches, advised topiramate or CGRP antagonists but pt declined additional meds at the time;    Plan Continue current medications  Consider topiramate or CGRP antagonist in future if needed  Patient Goals/Self-Care Activities . Patient will:  - take medications as prescribed focus on medication adherence by pill box check blood pressure daily, document, and provide at future appointments  -Keep MRI appt and follow up with cardiology as scheduled -Follow up with gynecologist if needed for bladder issues  Follow Up Plan: Telephone follow up appointment with care management team member scheduled for: 1 year      Medication Assistance: None required.  Patient affirms current coverage meets needs.  Patient's preferred pharmacy is:  [id number] [id number]: [phone]: 3[place] [address]Phone: [phone]: [phone] Uses pill box? Yes Pt endorses 100% compliance  We discussed: Current pharmacy is preferred with insurance plan and patient is satisfied with pharmacy services Patient decided to: Continue current medication management strategy  Care Plan  and Follow Up Patient Decision:  Patient agrees to Care Plan and Follow-up.  Plan: Telephone follow up appointment with care management team member scheduled for:  1 year  LCharlene Brooke PharmD, [place] CPP Clinical Pharmacist [phone]

## 2020-12-06 LAB — URINE CULTURE: Result:: NO GROWTH

## 2020-12-30 ENCOUNTER — Other Ambulatory Visit (HOSPITAL_COMMUNITY): Payer: Self-pay | Admitting: Emergency Medicine

## 2020-12-30 ENCOUNTER — Telehealth (HOSPITAL_COMMUNITY): Payer: Self-pay | Admitting: Emergency Medicine

## 2020-12-30 DIAGNOSIS — I421 Obstructive hypertrophic cardiomyopathy: Secondary | ICD-10-CM

## 2020-12-30 NOTE — Telephone Encounter (Signed)
Reaching out to patient to offer assistance regarding upcoming cardiac imaging study; pt verbalizes understanding of appt date/time, parking situation and where to check in, and verified current allergies; name and call back number provided for further questions should they arise Monique Bond RN Navigator [facility] [phone] office [phone] cell    Pt to arrive at [facility] entrance for pre-test EKG at 1:15pm Monique [name]

## 2020-12-31 ENCOUNTER — Ambulatory Visit (HOSPITAL_COMMUNITY)
Admission: RE | Admit: 2020-12-31 | Discharge: 2020-12-31 | Disposition: A | Discharge status: Home / Self Care | Payer: Medicare Other | Source: Ambulatory Visit | Attending: Cardiology | Admitting: Cardiology

## 2020-12-31 ENCOUNTER — Encounter: Payer: Self-pay | Admitting: Cardiology

## 2020-12-31 ENCOUNTER — Other Ambulatory Visit: Payer: Self-pay

## 2020-12-31 ENCOUNTER — Ambulatory Visit (HOSPITAL_COMMUNITY)
Admission: RE | Admit: 2020-12-31 | Discharge: 2020-12-31 | Disposition: A | Discharge status: Home / Self Care | Payer: Medicare Other | Source: Ambulatory Visit | Attending: Physician Assistant | Admitting: Physician Assistant

## 2020-12-31 DIAGNOSIS — R072 Precordial pain: Secondary | ICD-10-CM | POA: Insufficient documentation

## 2020-12-31 DIAGNOSIS — I421 Obstructive hypertrophic cardiomyopathy: Secondary | ICD-10-CM | POA: Diagnosis not present

## 2020-12-31 DIAGNOSIS — I712 Thoracic aortic aneurysm, without rupture, unspecified: Secondary | ICD-10-CM

## 2020-12-31 MED ORDER — METOPROLOL TARTRATE 5 MG/5ML IV SOLN
INTRAVENOUS | Status: AC
  Filled 2020-12-31: qty 5

## 2020-12-31 MED ORDER — AMINOPHYLLINE 25 MG/ML IV SOLN
INTRAVENOUS | Status: AC
  Filled 2020-12-31: qty 10

## 2020-12-31 MED ORDER — REGADENOSON 0.4 MG/5ML IV SOLN
INTRAVENOUS | Status: AC
  Filled 2020-12-31: qty 5

## 2020-12-31 MED ORDER — NITROGLYCERIN 0.4 MG SL SUBL
SUBLINGUAL_TABLET | SUBLINGUAL | Status: AC
  Filled 2020-12-31: qty 3

## 2020-12-31 MED ORDER — ALBUTEROL SULFATE HFA 108 (90 BASE) MCG/ACT IN AERS
INHALATION_SPRAY | RESPIRATORY_TRACT | Status: AC
  Filled 2020-12-31: qty 6.7

## 2020-12-31 MED ORDER — GADOBUTROL 1 MMOL/ML IV SOLN
8.0000 mL | Freq: Once | INTRAVENOUS | Status: AC | PRN
  Administered 2020-12-31: 8 mL via INTRAVENOUS

## 2020-12-31 NOTE — Progress Notes (Addendum)
6 minutes post lexiscan administration. Dr. Wynelle Beckmann at [place]. Pt denies any symptoms.

## 2020-12-31 NOTE — Progress Notes (Signed)
3 minutes post lexiscan

## 2020-12-31 NOTE — Progress Notes (Signed)
Pre-stress vital signs

## 2020-12-31 NOTE — Progress Notes (Signed)
Patient presented for stress MRI today.  EKG obtained shows sinus bradycardia with rate 54 bpm.  BP 152/71.  Lungs CTAB.  The risks [chest pain, shortness of breath, cardiac arrhythmias, dizziness, blood pressure fluctuations, myocardial infarction, stroke/transient ischemic attack, nausea, vomiting, allergic reaction, radiation exposure, metallic taste sensation and life-threatening complications (estimated to be 1 in 10,000)], benefits (risk stratification, diagnosing coronary artery disease, treatment guidance) and alternatives of a cardiac MRI stress test were discussed in detail with Monique [name] and she agrees to proceed.

## 2020-12-31 NOTE — Progress Notes (Signed)
VS at time of lexiscan administration. Dr. Gardiner Rhyme at [facility].

## 2020-12-31 NOTE — Progress Notes (Signed)
2 minutes post lexiscan admnistration

## 2021-01-04 ENCOUNTER — Ambulatory Visit: Payer: Medicare Other | Admitting: Internal Medicine

## 2021-01-05 ENCOUNTER — Encounter: Payer: Self-pay | Admitting: Physician Assistant

## 2021-01-09 ENCOUNTER — Other Ambulatory Visit: Payer: Self-pay | Admitting: Internal Medicine

## 2021-01-11 NOTE — Telephone Encounter (Signed)
Pt's age 85, wt 60.8 kg, SCr 1.24, CrCl 33.57, last ov w/ SW 11/18/20.

## 2021-01-30 ENCOUNTER — Other Ambulatory Visit: Payer: Self-pay | Admitting: Internal Medicine

## 2021-02-16 ENCOUNTER — Encounter: Payer: Self-pay | Admitting: *Deleted

## 2021-03-19 ENCOUNTER — Other Ambulatory Visit: Payer: Self-pay

## 2021-03-19 ENCOUNTER — Ambulatory Visit (INDEPENDENT_AMBULATORY_CARE_PROVIDER_SITE_OTHER): Payer: Medicare Other | Admitting: Internal Medicine

## 2021-03-19 ENCOUNTER — Encounter: Payer: Self-pay | Admitting: Internal Medicine

## 2021-03-19 VITALS — BP 138/90 | HR 58 | Temp 98.4°F | Resp 18 | Ht 66.0 in | Wt 131.6 lb

## 2021-03-19 DIAGNOSIS — N1832 Chronic kidney disease, stage 3b: Secondary | ICD-10-CM | POA: Diagnosis not present

## 2021-03-19 DIAGNOSIS — R0789 Other chest pain: Secondary | ICD-10-CM | POA: Diagnosis not present

## 2021-03-19 DIAGNOSIS — M810 Age-related osteoporosis without current pathological fracture: Secondary | ICD-10-CM | POA: Insufficient documentation

## 2021-03-19 DIAGNOSIS — E785 Hyperlipidemia, unspecified: Secondary | ICD-10-CM | POA: Diagnosis not present

## 2021-03-19 DIAGNOSIS — E1169 Type 2 diabetes mellitus with other specified complication: Secondary | ICD-10-CM

## 2021-03-19 DIAGNOSIS — E118 Type 2 diabetes mellitus with unspecified complications: Secondary | ICD-10-CM | POA: Diagnosis not present

## 2021-03-19 DIAGNOSIS — I10 Essential (primary) hypertension: Secondary | ICD-10-CM

## 2021-03-19 DIAGNOSIS — R350 Frequency of micturition: Secondary | ICD-10-CM | POA: Diagnosis not present

## 2021-03-19 LAB — CBC
HCT: 37.8 % (ref 36.0–46.0)
Hemoglobin: 12.5 g/dL (ref 12.0–15.0)
MCHC: 33.2 g/dL (ref 30.0–36.0)
MCV: 92.5 fl (ref 78.0–100.0)
Platelets: 179 10*3/uL (ref 150.0–400.0)
RBC: 4.08 Mil/uL (ref 3.87–5.11)
RDW: 13.9 % (ref 11.5–15.5)
WBC: 7 10*3/uL (ref 4.0–10.5)

## 2021-03-19 LAB — POCT URINALYSIS DIPSTICK
Bilirubin, UA: NEGATIVE
Blood, UA: NEGATIVE
Glucose, UA: NEGATIVE
Ketones, UA: NEGATIVE
Leukocytes, UA: NEGATIVE
Nitrite, UA: NEGATIVE
Protein, UA: NEGATIVE
Spec Grav, UA: 1.015 (ref 1.010–1.025)
Urobilinogen, UA: 0.2 E.U./dL
pH, UA: 7.5 (ref 5.0–8.0)

## 2021-03-19 LAB — HEMOGLOBIN A1C: Hgb A1c MFr Bld: 7 % — ABNORMAL HIGH (ref 4.6–6.5)

## 2021-03-19 LAB — LIPID PANEL
Cholesterol: 127 mg/dL (ref 0–200)
HDL: 57.6 mg/dL (ref >39.00)
LDL Cholesterol: 56 mg/dL (ref 0–99)
NonHDL: 69.11
Total CHOL/HDL Ratio: 2
Triglycerides: 65 mg/dL (ref 0.0–149.0)
VLDL: 13 mg/dL (ref 0.0–40.0)

## 2021-03-19 LAB — COMPREHENSIVE METABOLIC PANEL
ALT: 10 U/L (ref 0–35)
AST: 20 U/L (ref 0–37)
Albumin: 4.1 g/dL (ref 3.5–5.2)
Alkaline Phosphatase: 90 U/L (ref 39–117)
BUN: 25 mg/dL — ABNORMAL HIGH (ref 6–23)
CO2: 32 mEq/L (ref 19–32)
Calcium: 10.1 mg/dL (ref 8.4–10.5)
Chloride: 104 mEq/L (ref 96–112)
Creatinine, Ser: 1.63 mg/dL — ABNORMAL HIGH (ref 0.40–1.20)
GFR: 29.09 mL/min — ABNORMAL LOW (ref >60.00)
Glucose, Bld: 133 mg/dL — ABNORMAL HIGH (ref 70–99)
Potassium: 4.1 mEq/L (ref 3.5–5.1)
Sodium: 142 mEq/L (ref 135–145)
Total Bilirubin: 0.5 mg/dL (ref 0.2–1.2)
Total Protein: 7 g/dL (ref 6.0–8.3)

## 2021-03-19 MED ORDER — LIDOCAINE 5 % EX PTCH: 1.0000 | MEDICATED_PATCH | CUTANEOUS | 11 refills | Status: DC

## 2021-03-19 NOTE — Assessment & Plan Note (Signed)
Checking HgA1c today. Complicated by CKD.

## 2021-03-19 NOTE — Assessment & Plan Note (Signed)
POC U/A done today without infection. Advised to drink extra fluids.

## 2021-03-19 NOTE — Assessment & Plan Note (Signed)
Checking CMP and BP at goal and diabetes at goal.

## 2021-03-19 NOTE — Progress Notes (Signed)
   Subjective:   Patient ID: Monique [name], female    DOB: 08/13/1936, 85 y.o.   MRN: [id number]  HPI The patient is an 85 YO female coming in for chest pain and possible UTI and needing follow up labs. Chest pain off and on worse since the weekend.   Review of Systems  Constitutional:  Positive for fatigue.  HENT: Negative.    Eyes: Negative.   Respiratory:  Negative for cough, chest tightness and shortness of breath.   Cardiovascular:  Positive for chest pain. Negative for palpitations and leg swelling.  Gastrointestinal:  Negative for abdominal distention, abdominal pain, constipation, diarrhea, nausea and vomiting.  Genitourinary:  Positive for dysuria and urgency.  Musculoskeletal: Negative.   Skin: Negative.   Neurological: Negative.   Psychiatric/Behavioral: Negative.     Objective:  Physical Exam Constitutional:      Appearance: She is well-developed.  HENT:     Head: Normocephalic and atraumatic.  Cardiovascular:     Rate and Rhythm: Normal rate and regular rhythm.  Pulmonary:     Effort: Pulmonary effort is normal. No respiratory distress.     Breath sounds: Normal breath sounds. No wheezing or rales.  Abdominal:     General: Bowel sounds are normal. There is no distension.     Palpations: Abdomen is soft.     Tenderness: There is no abdominal tenderness. There is no rebound.  Musculoskeletal:     Cervical back: Normal range of motion.  Skin:    General: Skin is warm and dry.  Neurological:     Mental Status: She is alert and oriented to person, place, and time.     Coordination: Coordination normal.    Vitals:   03/19/21 0911  BP: 138/90  Pulse: (!) 58  Resp: 18  Temp: 98.4 F (36.9 C)  TempSrc: Oral  SpO2: 99%  Weight: 131 lb 9.6 oz (59.7 kg)  Height: 5\' 6"  (1.676 m)   EKG: Rate 53, axis left, interval normal, sinus bradycardia, LVH with repolarization change, no st or t wave changes, no significant change compared to prior 12/2020  This visit  occurred during the SARS-CoV-2 public health emergency.  Safety protocols were in place, including screening questions prior to the visit, additional usage of staff PPE, and extensive cleaning of exam room while observing appropriate contact time as indicated for disinfecting solutions.   Assessment & Plan:

## 2021-03-19 NOTE — Patient Instructions (Signed)
We will check the urine and the blood work today.   The EKG looks normal.   We will get the prolia shots approved and call you to use to strengthen the bones.

## 2021-03-19 NOTE — Assessment & Plan Note (Signed)
Checking CMP and lipid panel. Taking amlodipine and metoprolol and disopyramide and adjust as needed. BP at goal. Complicated by CKD.

## 2021-03-19 NOTE — Assessment & Plan Note (Signed)
EKG done today which is not changed from prior. She uses lidocaine patch for this and is out. Refilled today. Reassurance given.

## 2021-03-19 NOTE — Assessment & Plan Note (Signed)
-  3.3 on last DEXA 2020. Would like to pursue prolia so we will get approved and start. Checking CMP today for calcium level.

## 2021-03-19 NOTE — Assessment & Plan Note (Signed)
Checking lipid panel and adjust lipitor 20 mg daily as needed. 

## 2021-03-23 ENCOUNTER — Telehealth: Payer: Self-pay | Admitting: Internal Medicine

## 2021-03-23 NOTE — Telephone Encounter (Signed)
New prolia start [place] Medicare advantage Request entered in [place] portal on 7.19.22

## 2021-03-29 ENCOUNTER — Other Ambulatory Visit: Payer: Self-pay | Admitting: Internal Medicine

## 2021-03-29 DIAGNOSIS — E782 Mixed hyperlipidemia: Secondary | ICD-10-CM

## 2021-03-29 DIAGNOSIS — I259 Chronic ischemic heart disease, unspecified: Secondary | ICD-10-CM

## 2021-03-29 DIAGNOSIS — I421 Obstructive hypertrophic cardiomyopathy: Secondary | ICD-10-CM

## 2021-04-08 ENCOUNTER — Other Ambulatory Visit: Payer: Self-pay | Admitting: Internal Medicine

## 2021-04-21 ENCOUNTER — Telehealth: Payer: Self-pay | Admitting: Internal Medicine

## 2021-04-21 NOTE — Telephone Encounter (Signed)
Left message for patient to call me back at [phone] to schedule Medicare Annual Wellness Visit   No hx of AWV eligible as of 09/06/15  Please schedule at anytime with [facility] Advisor if patient calls the office back.    38 Minutes appointment   Any questions, please call me at [phone]

## 2021-04-28 ENCOUNTER — Ambulatory Visit (INDEPENDENT_AMBULATORY_CARE_PROVIDER_SITE_OTHER): Payer: Medicare Other

## 2021-04-28 ENCOUNTER — Other Ambulatory Visit: Payer: Self-pay

## 2021-04-28 VITALS — BP 120/60 | HR 55 | Ht 66.0 in | Wt 131.6 lb

## 2021-04-28 DIAGNOSIS — Z0000 Encounter for general adult medical examination without abnormal findings: Secondary | ICD-10-CM | POA: Diagnosis not present

## 2021-04-28 NOTE — Patient Instructions (Signed)
Monique [name] , Thank you for taking time to come for your Medicare Wellness Visit. I appreciate your ongoing commitment to your health goals. Please review the following plan we discussed and let me know if I can assist you in the future.   Screening recommendations/referrals: Colonoscopy: last done 01/26/2016; no repeat due to age 85: last done 07/06/2020; due every year Bone Density: last done 04/16/2019; due every 2 years Recommended yearly ophthalmology/optometry visit for glaucoma screening and checkup Recommended yearly dental visit for hygiene and checkup  Vaccinations: Influenza vaccine: 06/19/2020; due 05/06/2021 Pneumococcal vaccine: 05/15/2012, 04/24/2015 Tdap vaccine: 06/20/2017; due every 10 years Shingles vaccine: never done   Covid-19: 11/10/2019, 12/09/2019, 08/27/2020  Advanced directives: Advance directive discussed with you today. Even though you declined this today please call our office should you change your mind and we can give you the proper paperwork for you to fill out.  Conditions/risks identified: Yes; Client understands the importance of follow-up with providers by attending scheduled visits and discussed goals to eat healthier, increase physical activity, exercise the brain, socialize more, get enough sleep and make time for laughter.  Next appointment: Please schedule your next Medicare Wellness Visit with your Nurse Health Advisor in 1 year by calling [phone].   Preventive Care 48 Years and Older, Female Preventive care refers to lifestyle choices and visits with your health care provider that can promote health and wellness. What does preventive care include? A yearly physical exam. This is also called an annual well check. Dental exams once or twice a year. Routine eye exams. Ask your health care provider how often you should have your eyes checked. Personal lifestyle choices, including: Daily care of your teeth and gums. Regular physical  activity. Eating a healthy diet. Avoiding tobacco and drug use. Limiting alcohol use. Practicing safe sex. Taking low-dose aspirin every day. Taking vitamin and mineral supplements as recommended by your health care provider. What happens during an annual well check? The services and screenings done by your health care provider during your annual well check will depend on your age, overall health, lifestyle risk factors, and family history of disease. Counseling  Your health care provider may ask you questions about your: Alcohol use. Tobacco use. Drug use. Emotional well-being. Home and relationship well-being. Sexual activity. Eating habits. History of falls. Memory and ability to understand (cognition). Work and work Statistician. Reproductive health. Screening  You may have the following tests or measurements: Height, weight, and BMI. Blood pressure. Lipid and cholesterol levels. These may be checked every 5 years, or more frequently if you are over 48 years old. Skin check. Lung cancer screening. You may have this screening every year starting at age 70 if you have a 30-pack-year history of smoking and currently smoke or have quit within the past 15 years. Fecal occult blood test (FOBT) of the stool. You may have this test every year starting at age 41. Flexible sigmoidoscopy or colonoscopy. You may have a sigmoidoscopy every 5 years or a colonoscopy every 10 years starting at age 64. Hepatitis C blood test. Hepatitis B blood test. Sexually transmitted disease (STD) testing. Diabetes screening. This is done by checking your blood sugar (glucose) after you have not eaten for a while (fasting). You may have this done every 1-3 years. Bone density scan. This is done to screen for osteoporosis. You may have this done starting at age 46. Mammogram. This may be done every 1-2 years. Talk to your health care provider about how often you should  have regular mammograms. Talk with your  health care provider about your test results, treatment options, and if necessary, the need for more tests. Vaccines  Your health care provider may recommend certain vaccines, such as: Influenza vaccine. This is recommended every year. Tetanus, diphtheria, and acellular pertussis (Tdap, Td) vaccine. You may need a Td booster every 10 years. Zoster vaccine. You may need this after age 29. Pneumococcal 13-valent conjugate (PCV13) vaccine. One dose is recommended after age 103. Pneumococcal polysaccharide (PPSV23) vaccine. One dose is recommended after age 50. Talk to your health care provider about which screenings and vaccines you need and how often you need them. This information is not intended to replace advice given to you by your health care provider. Make sure you discuss any questions you have with your health care provider. Document Released: 09/18/2015 Document Revised: 05/11/2016 Document Reviewed: 06/23/2015 Elsevier Interactive Patient Education  2017 [place] Prevention in the Home Falls can cause injuries. They can happen to people of all ages. There are many things you can do to make your home safe and to help prevent falls. What can I do on the outside of my home? Regularly fix the edges of walkways and driveways and fix any cracks. Remove anything that might make you trip as you walk through a door, such as a raised step or threshold. Trim any bushes or trees on the path to your home. Use bright outdoor lighting. Clear any walking paths of anything that might make someone trip, such as rocks or tools. Regularly check to see if handrails are loose or broken. Make sure that both sides of any steps have handrails. Any raised decks and porches should have guardrails on the edges. Have any leaves, snow, or ice cleared regularly. Use sand or salt on walking paths during winter. Clean up any spills in your garage right away. This includes oil or grease spills. What can I  do in the bathroom? Use night lights. Install grab bars by the toilet and in the tub and shower. Do not use towel bars as grab bars. Use non-skid mats or decals in the tub or shower. If you need to sit down in the shower, use a plastic, non-slip stool. Keep the floor dry. Clean up any water that spills on the floor as soon as it happens. Remove soap buildup in the tub or shower regularly. Attach bath mats securely with double-sided non-slip rug tape. Do not have throw rugs and other things on the floor that can make you trip. What can I do in the bedroom? Use night lights. Make sure that you have a light by your bed that is easy to reach. Do not use any sheets or blankets that are too big for your bed. They should not hang down onto the floor. Have a firm chair that has side arms. You can use this for support while you get dressed. Do not have throw rugs and other things on the floor that can make you trip. What can I do in the kitchen? Clean up any spills right away. Avoid walking on wet floors. Keep items that you use a lot in easy-to-reach places. If you need to reach something above you, use a strong step stool that has a grab bar. Keep electrical cords out of the way. Do not use floor polish or wax that makes floors slippery. If you must use wax, use non-skid floor wax. Do not have throw rugs and other things on the floor  that can make you trip. What can I do with my stairs? Do not leave any items on the stairs. Make sure that there are handrails on both sides of the stairs and use them. Fix handrails that are broken or loose. Make sure that handrails are as long as the stairways. Check any carpeting to make sure that it is firmly attached to the stairs. Fix any carpet that is loose or worn. Avoid having throw rugs at the top or bottom of the stairs. If you do have throw rugs, attach them to the floor with carpet tape. Make sure that you have a light switch at the top of the stairs  and the bottom of the stairs. If you do not have them, ask someone to add them for you. What else can I do to help prevent falls? Wear shoes that: Do not have high heels. Have rubber bottoms. Are comfortable and fit you well. Are closed at the toe. Do not wear sandals. If you use a stepladder: Make sure that it is fully opened. Do not climb a closed stepladder. Make sure that both sides of the stepladder are locked into place. Ask someone to hold it for you, if possible. Clearly mark and make sure that you can see: Any grab bars or handrails. First and last steps. Where the edge of each step is. Use tools that help you move around (mobility aids) if they are needed. These include: Canes. Walkers. Scooters. Crutches. Turn on the lights when you go into a dark area. Replace any light bulbs as soon as they burn out. Set up your furniture so you have a clear path. Avoid moving your furniture around. If any of your floors are uneven, fix them. If there are any pets around you, be aware of where they are. Review your medicines with your doctor. Some medicines can make you feel dizzy. This can increase your chance of falling. Ask your doctor what other things that you can do to help prevent falls. This information is not intended to replace advice given to you by your health care provider. Make sure you discuss any questions you have with your health care provider. Document Released: 06/18/2009 Document Revised: 01/28/2016 Document Reviewed: 09/26/2014 Elsevier Interactive Patient Education  2017 [employer].

## 2021-04-28 NOTE — Progress Notes (Signed)
Subjective:   Monique [name] is a 85 y.o. female who presents for Medicare Annual (Subsequent) preventive examination.  Review of Systems     Cardiac Risk Factors include: advanced age (>54mn, >>76women);diabetes mellitus;dyslipidemia;family history of premature cardiovascular disease;hypertension     Objective:    Today's Vitals   04/28/21 1544  BP: 120/60  Pulse: (!) 55  SpO2: 98%  Weight: 131 lb 9.6 oz (59.7 kg)  Height: '5\' 6"'$  (1.676 m)  PainSc: 8   PainLoc: Shoulder   Body mass index is 21.24 kg/m.  Advanced Directives 04/28/2021 08/21/2019 09/07/2017 09/07/2017 11/28/2014 11/21/2014 06/27/2014  Does Patient Have a Medical Advance Directive? No No No No No No No  Would patient like information on creating a medical advance directive? No - Patient declined No - Patient declined No - Patient declined - No - patient declined information No - patient declined information No - patient declined information    Current Medications (verified) Outpatient Encounter Medications as of 04/28/2021  Medication Sig   acetaminophen (TYLENOL) 500 MG tablet Take 1,000 mg by mouth every 6 (six) hours as needed for moderate pain or headache.   alum & mag hydroxide-simeth (MAALOX/MYLANTA) 200-200-20 MG/5ML suspension Take 15 mLs by mouth every 4 (four) hours as needed for indigestion or heartburn.   amLODipine (NORVASC) 5 MG tablet TAKE 1 TABLET BY MOUTH 2 TIMES A DAY   atorvastatin (LIPITOR) 20 MG tablet TAKE 1 TABLET (20 MG TOTAL) BY MOUTH DAILY AT 6 PM.   disopyramide (NORPACE) 150 MG capsule TAKE 1 CAPSULE (150 MG TOTAL) BY MOUTH 2 (TWO) TIMES DAILY.   lidocaine (LIDODERM) 5 % Place 1 patch onto the skin daily.   metFORMIN (GLUCOPHAGE) 500 MG tablet TAKE 1 TABLET BY MOUTH EVERY DAY WITH BREAKFAST   metoprolol tartrate (LOPRESSOR) 50 MG tablet TAKE 1 TABLET BY MOUTH TWICE A DAY   mirtazapine (REMERON) 15 MG tablet TAKE 1 TABLET BY MOUTH EVERYDAY AT BEDTIME   nitroGLYCERIN (NITROSTAT) 0.4 MG SL  tablet PLACE 1 TABLET (0.4 MG TOTAL) UNDER THE TONGUE EVERY 5 (FIVE) MINUTES AS NEEDED FOR CHEST PAIN.   pantoprazole (PROTONIX) 40 MG tablet TAKE 1 TABLET BY MOUTH EVERY DAY   trospium (SANCTURA) 20 MG tablet Take 1 tablet (20 mg total) by mouth 2 (two) times daily.   XARELTO 15 MG TABS tablet TAKE 1 TABLET BY MOUTH EVERY DAY WITH SUPPER   [DISCONTINUED] mirabegron ER (MYRBETRIQ) 25 MG TB24 tablet Take 1 tablet (25 mg total) by mouth daily.   No facility-administered encounter medications on file as of 04/28/2021.    Allergies (verified) Patient has no known allergies.   History: Past Medical History:  Diagnosis Date   Abdominal pain 11/18/2015   Acute respiratory failure with hypoxia ([place] 06/27/2014   Breast pain, left 10/11/2016   Cerebral aneurysm 2000 06/27/2014   Chest pain 10/30/2014   CHF (congestive heart failure) ([facility]    Chronic anticoagulation 06/2014   Coumadin started after bilateral PE   Chronic kidney disease, stage III (moderate) ([facility] 11/21/2014   Clotting disorder ([facility]    right was removed, still have left cataract   Diabetes ([facility] [place]    Dizziness and giddiness 11/28/2014   DVT of lower extremity (deep venous thrombosis) ([facility] 11/28/2014   Dysuria 07/07/2014   Elevated troponin 11/28/2014   Fatty liver    Gastritis 05/16/2012   Chronic dyspepsia on PPI daily.    Headache, acute 06/08/2010   Qualifier: Diagnosis of  By: NLinda HedgesMD,  Heinz Knuckles    Heart murmur    Hematochezia 11/18/2015   History of fibromuscular dysplasia 06/27/2014   HTN (hypertension)    Essential   Hyperlipidemia    Hypertrophic obstructive cardiomyopathy (HOCM) ([place]) 05/20/2008   CMR 4/22: EF 72, small amount of patchy LGE in basal septum c/w HCM, asymmetric basal septal hypertrophy (15 mm) c/w HCM, no ischemia, ascending aorta 38 mm   Kidney stones    LVH (left ventricular hypertrophy)    Orthostatic hypotension 11/28/2014   PAF (paroxysmal atrial fibrillation) ([place]) 2000   Palpitations 11/28/2014    PAROXYSMAL ATRIAL FIBRILLATION 05/20/2008   Qualifier: Diagnosis of  By: Linda Hedges MD, Heinz Knuckles    PE (pulmonary embolism) 06/2014   Bilateral   Postmenopausal osteoporosis 04/2019   T score -3.3   Pulmonary embolism, bilateral ([place]) 06/27/2014   SOB (shortness of breath) 06/27/2014   Subarachnoid hemorrhage ([place]) 2000   UTI (urinary tract infection) 01/04/2015   Past Surgical History:  Procedure Laterality Date   ABDOMINAL HYSTERECTOMY  2000   Brain hemorrage     2000   CARDIAC CATHETERIZATION  05/2008   Nl cors, no gradient, EF 60%   Family History  Problem Relation Age of Onset   Breast cancer Mother 51       Deceased   Stroke Mother    Arrhythmia Mother    Heart disease Mother    Colon cancer Sister    Cervical cancer Sister    Arrhythmia Sister    Breast cancer Daughter 75   Esophageal cancer Neg Hx    Pancreatic cancer Neg Hx    Rectal cancer Neg Hx    Stomach cancer Neg Hx    Social History   Socioeconomic History   Marital status: Divorced    Spouse name: Not on file   Number of children: 4   Years of education: 12   Highest education level: Not on file  Occupational History   Occupation: Retired Doctor, hospital  Tobacco Use   Smoking status: Former    Types: Cigarettes    Quit date: 09/06/1967    Years since quitting: 53.6   Smokeless tobacco: Never  Vaping Use   Vaping Use: Never used  Substance and Sexual Activity   Alcohol use: No    Alcohol/week: 0.0 standard drinks   Drug use: No   Sexual activity: Not Currently    Comment: 1st intercourse 85yo-Fewer than 5 partners  Other Topics Concern   Not on file  Social History Narrative   HSG. Married '61 - '88/divorced. 3 sons - '74, '59, '62, 1 daughter '63; 5 grandchildren. 10/06/20 Lives alone in two story home.    She does not drive.   Retired from [employer].   Social Determinants of Health   Financial Resource Strain: Low Risk    Difficulty of Paying Living Expenses: Not  hard at all  Food Insecurity: No Food Insecurity   Worried About Charity fundraiser in the Last Year: Never true   [place] in the Last Year: Never true  Transportation Needs: No Transportation Needs   Lack of Transportation (Medical): No   Lack of Transportation (Non-Medical): No  Physical Activity: Sufficiently Active   Days of Exercise per Week: 5 days   Minutes of Exercise per Session: 30 min  Stress: No Stress Concern Present   Feeling of Stress : Not at all  Social Connections: Moderately Integrated   Frequency of Communication with Friends and  Family: More than three times a week   Frequency of Social Gatherings with Friends and Family: More than three times a week   Attends Religious Services: More than 4 times per year   Active Member of [employer] or Organizations: Yes   Attends Music therapist: More than 4 times per year   Marital Status: Divorced    Tobacco Counseling Counseling given: Not Answered   Clinical Intake:  Pre-visit preparation completed: Yes  Pain : 0-10 Pain Score: 8  Pain Type: Acute pain Pain Location: Shoulder Pain Orientation: Left Pain Radiating Towards: none Pain Descriptors / Indicators: Constant Pain Onset: In the past 7 days Pain Frequency: Constant Pain Relieving Factors: Tylenol  Pain Relieving Factors: Tylenol  BMI - recorded: 24.24 Nutritional Status: BMI of 19-24  Normal Nutritional Risks: None Diabetes: Yes CBG done?: No Did pt. bring in CBG monitor from home?: No  How often do you need to have someone help you when you read instructions, pamphlets, or other written materials from your doctor or pharmacy?: 1 - Never What is the last grade level you completed in school?: HSG  Diabetic? yes  Interpreter Needed?: No  Information entered by :: Lisette Abu, LPN   Activities of Daily Living In your present state of health, do you have any difficulty performing the following activities: 04/28/2021  06/19/2020  Hearing? N N  Vision? N N  Difficulty concentrating or making decisions? N N  Walking or climbing stairs? N N  Dressing or bathing? N N  Doing errands, shopping? N N  Preparing Food and eating ? N -  Using the Toilet? N -  In the past six months, have you accidently leaked urine? N -  Do you have problems with loss of bowel control? N -  Managing your Medications? N -  Managing your Finances? N -  Housekeeping or managing your Housekeeping? N -  Some recent data might be hidden    Patient Care Team: Hoyt Koch, MD as PCP - General (Internal Medicine) Fay Records, MD as PCP - Cardiology (Cardiology) Fay Records, MD (Cardiology) Charlton Haws, [facility] as Pharmacist (Pharmacist) Monna Fam, MD as Consulting Physician (Ophthalmology)  Indicate any recent Medical Services you may have received from other than [facility] providers in the past year (date may be approximate).     Assessment:   This is a routine wellness examination for Shanen.  Hearing/Vision screen Hearing Screening - Comments:: Patient denied any hearing difficulty.  No hearing aids needed. Vision Screening - Comments:: Patient has annual eye visits done by Dr. Monna Fam.  Dietary issues and exercise activities discussed: Current Exercise Habits: Home exercise routine, Type of exercise: walking, Time (Minutes): 30, Frequency (Times/Week): 5, Weekly Exercise (Minutes/Week): 150, Intensity: Mild, Exercise limited by: orthopedic condition(s);cardiac condition(s)   Goals Addressed               This Visit's Progress     Client understands the importance of follow-up with providers by attending scheduled visits (pt-stated)        Continue to be independent and active.      Depression Screen PHQ 2/9 Scores 04/28/2021 04/17/2020 02/12/2019 07/16/2018 06/20/2017 01/01/2016 11/18/2015  PHQ - 2 Score 0 0 0 0 0 0 0    Fall Risk Fall Risk  04/28/2021 03/19/2021 01/02/2020 02/12/2019  07/16/2018  Falls in the past year? 0 0 0 0 0  Number falls in past yr: 0 0 0 - -  Injury with  Fall? 0 0 0 - -  Risk for fall due to : No Fall Risks - - - -  Follow up Falls evaluation completed - - - -    FALL RISK PREVENTION PERTAINING TO THE HOME:  Any stairs in or around the home? Yes  If so, are there any without handrails? No  Home free of loose throw rugs in walkways, pet beds, electrical cords, etc? Yes  Adequate lighting in your home to reduce risk of falls? Yes   ASSISTIVE DEVICES UTILIZED TO PREVENT FALLS:  Life alert? No  Use of a cane, walker or w/c? No  Grab bars in the bathroom? No  Shower chair or bench in shower? No  Elevated toilet seat or a handicapped toilet? No   TIMED UP AND GO:  Was the test performed? Yes .  Length of time to ambulate 10 feet: 6 sec.   Gait steady and fast without use of assistive device  Cognitive Function: Normal cognitive status assessed by direct observation by this Nurse Health Advisor. No abnormalities found.          Immunizations Immunization History  Administered Date(s) Administered   Fluad Quad(high Dose 65+) 07/16/2019, 06/19/2020   Influenza Split 05/15/2012   Influenza Whole 09/03/2009   Influenza, High Dose Seasonal PF 10/11/2016, 06/20/2017, 07/16/2018   Influenza,inj,Quad PF,6+ Mos 06/28/2014, 09/17/2015   PFIZER(Purple Top)SARS-COV-2 Vaccination 11/10/2019, 12/09/2019, 08/27/2020   Pneumococcal Conjugate-13 04/24/2015   Pneumococcal Polysaccharide-23 05/15/2012   Td 11/08/2002   Tdap 06/20/2017   Zoster, Live 04/09/2013    TDAP status: Up to date  Flu Vaccine status: Up to date  Pneumococcal vaccine status: Up to date  Covid-19 vaccine status: Completed vaccines  Qualifies for Shingles Vaccine? Yes   Zostavax completed Yes   Shingrix Completed?: No.    Education has been provided regarding the importance of this vaccine. Patient has been advised to call insurance company to determine out of pocket  expense if they have not yet received this vaccine. Advised may also receive vaccine at local pharmacy or Health Dept. Verbalized acceptance and understanding.  Screening Tests Health Maintenance  Topic Date Due   Zoster Vaccines- Shingrix (1 of 2) Never done   OPHTHALMOLOGY EXAM  11/22/2018   URINE MICROALBUMIN  02/12/2020   COVID-19 Vaccine (4 - Booster for Pfizer series) 12/26/2020   INFLUENZA VACCINE  04/05/2021   FOOT EXAM  12/03/2021 (Originally 07/15/2020)   HEMOGLOBIN A1C  09/19/2021   TETANUS/TDAP  06/21/2027   DEXA SCAN  Completed   PNA vac Low Risk Adult  Completed   HPV VACCINES  Aged Out    Health Maintenance  Health Maintenance Due  Topic Date Due   Zoster Vaccines- Shingrix (1 of 2) Never done   OPHTHALMOLOGY EXAM  11/22/2018   URINE MICROALBUMIN  02/12/2020   COVID-19 Vaccine (4 - Booster for Pfizer series) 12/26/2020   INFLUENZA VACCINE  04/05/2021    Colorectal cancer screening: No longer required.   Mammogram status: Completed 07/06/2020. Repeat every year  Bone Density status: Completed 04/16/2019. Results reflect: Bone density results: OSTEOPOROSIS. Repeat every 2 years.  Lung Cancer Screening: (Low Dose CT Chest recommended if Age 42-80 years, 30 pack-year currently smoking OR have quit w/in 15years.) does not qualify.   Lung Cancer Screening Referral: no  Additional Screening:  Hepatitis C Screening: does not qualify; Completed no  Vision Screening: Recommended annual ophthalmology exams for early detection of glaucoma and other disorders of the eye. Is the patient up  to date with their annual eye exam?  Yes  Who is the provider or what is the name of the office in which the patient attends annual eye exams? Kandy Garrison, MD. If pt is not established with a provider, would they like to be referred to a provider to establish care? No .   Dental Screening: Recommended annual dental exams for proper oral hygiene  Community Resource Referral /  Chronic Care Management: CRR required this visit?  No   CCM required this visit?  No      Plan:     I have personally reviewed and noted the following in the patient's chart:   Medical and social history Use of alcohol, tobacco or illicit drugs  Current medications and supplements including opioid prescriptions.  Functional ability and status Nutritional status Physical activity Advanced directives List of other physicians Hospitalizations, surgeries, and ER visits in previous 12 months Vitals Screenings to include cognitive, depression, and falls Referrals and appointments  In addition, I have reviewed and discussed with patient certain preventive protocols, quality metrics, and best practice recommendations. A written personalized care plan for preventive services as well as general preventive health recommendations were provided to patient.     Sheral Flow, LPN   [id number]   Nurse Notes: n/a

## 2021-04-30 ENCOUNTER — Other Ambulatory Visit: Payer: Self-pay | Admitting: Internal Medicine

## 2021-05-31 ENCOUNTER — Other Ambulatory Visit: Payer: Self-pay | Admitting: Internal Medicine

## 2021-06-10 ENCOUNTER — Telehealth: Payer: Self-pay | Admitting: Pharmacist

## 2021-06-10 NOTE — Progress Notes (Signed)
    Chronic Care Management Pharmacy Assistant   Name: Monique [name]  MRN: [id number] DOB: 1935/11/06   Reason for Encounter: Disease State   Conditions to be addressed/monitored: General   Recent office visits:  03/19/21 Hoyt Koch, MD-PCP (BP Issues/ Chest Pain) no med changes  Recent consult visits:  None ID  Hospital visits:  None in previous 6 months  Medications: Outpatient Encounter Medications as of 06/10/2021  Medication Sig   acetaminophen (TYLENOL) 500 MG tablet Take 1,000 mg by mouth every 6 (six) hours as needed for moderate pain or headache.   alum & mag hydroxide-simeth (MAALOX/MYLANTA) 200-200-20 MG/5ML suspension Take 15 mLs by mouth every 4 (four) hours as needed for indigestion or heartburn.   amLODipine (NORVASC) 5 MG tablet TAKE 1 TABLET BY MOUTH 2 TIMES A DAY   atorvastatin (LIPITOR) 20 MG tablet TAKE 1 TABLET (20 MG TOTAL) BY MOUTH DAILY AT 6 PM.   disopyramide (NORPACE) 150 MG capsule TAKE 1 CAPSULE (150 MG TOTAL) BY MOUTH 2 (TWO) TIMES DAILY.   lidocaine (LIDODERM) 5 % Place 1 patch onto the skin daily.   metFORMIN (GLUCOPHAGE) 500 MG tablet TAKE 1 TABLET BY MOUTH EVERY DAY WITH BREAKFAST   metoprolol tartrate (LOPRESSOR) 50 MG tablet TAKE 1 TABLET BY MOUTH TWICE A DAY   mirtazapine (REMERON) 15 MG tablet TAKE 1 TABLET BY MOUTH EVERYDAY AT BEDTIME   nitroGLYCERIN (NITROSTAT) 0.4 MG SL tablet PLACE 1 TABLET (0.4 MG TOTAL) UNDER THE TONGUE EVERY 5 (FIVE) MINUTES AS NEEDED FOR CHEST PAIN.   pantoprazole (PROTONIX) 40 MG tablet TAKE 1 TABLET BY MOUTH EVERY DAY   trospium (SANCTURA) 20 MG tablet Take 1 tablet (20 mg total) by mouth 2 (two) times daily.   XARELTO 15 MG TABS tablet TAKE 1 TABLET BY MOUTH EVERY DAY WITH SUPPER   [DISCONTINUED] mirabegron ER (MYRBETRIQ) 25 MG TB24 tablet Take 1 tablet (25 mg total) by mouth daily.   No facility-administered encounter medications on file as of 06/10/2021.    Contacted Apphia Cropley on 06/10/21 for  general disease state and medication adherence call.   Patient is not > 5 days past due for refill on the following medications per chart history:  [facility] Medications: Medication Name/mg Last Fill Days Supply Metformin 500 mg  04/09/21  90 Atorvastatin 20 mg  05/04/21 90  What concerns do you have about your medications? Patient does not have any concerns about medications  The patient denies side effects with her medications.   How often do you forget or accidentally miss a dose? Never  Do you use a pillbox? No  Are you having any problems getting your medications from your pharmacy? No  Has the cost of your medications been a concern? No If yes, what medication and is patient assistance available or has it been applied for?  Since last visit with CPP, no interventions have been made:   The patient has not had an ED visit since last contact.   The patient denies problems with their health.   she denies  concerns or questions for [employer], Pharm. D at this time.     Care Gaps: Annual wellness visit in last year? No Most Recent BP reading and date:120/60, 04/28/21  If Diabetic: Most recent A1C reading: Last eye exam / retinopathy screening: Last diabetic foot exam:  CCM appointment on 12/01/21    [place] Pharmacist Assistant [phone]

## 2021-06-13 ENCOUNTER — Other Ambulatory Visit: Payer: Self-pay | Admitting: Internal Medicine

## 2021-06-16 ENCOUNTER — Telehealth: Payer: Self-pay | Admitting: Internal Medicine

## 2021-06-16 DIAGNOSIS — R079 Chest pain, unspecified: Secondary | ICD-10-CM

## 2021-06-16 MED ORDER — NITROGLYCERIN 0.4 MG SL SUBL: 0.4000 mg | SUBLINGUAL_TABLET | SUBLINGUAL | 1 refills | Status: DC | PRN

## 2021-06-16 NOTE — Telephone Encounter (Signed)
Left a message for the pt to call back.  

## 2021-06-16 NOTE — Telephone Encounter (Signed)
Pt c/o of Chest Pain: STAT if CP now or developed within 24 hours  1. Are you having CP right now? no  2. Are you experiencing any other symptoms (ex. SOB, nausea, vomiting, sweating)? no  3. How long have you been experiencing CP? no  4. Is your CP continuous or coming and going? Comes and goes   5. Have you taken Nitroglycerin? no ?

## 2021-06-16 NOTE — Telephone Encounter (Signed)
Attempted to call the pt but unable to get through due to busy signal.. will try again this afternoon.    Liliane Shi, PA-C  01/05/2021  1:21 PM EDT     The stress portion of her MRI is normal.  The abnormal thickening of her heart (hypertrophic cardiomyopathy) appears stable. The ascending aorta size appears stable on this study.

## 2021-06-16 NOTE — Telephone Encounter (Signed)
Spoke with the patient who reports that yesterday morning she had an episode of chest pain. She describes it as tightness behind her left breast. She was just waking up when it occurred. Pain was relieved within a couple of hours. She had no associated symptoms such as SOB, dizziness, N/V, or diaphoresis. She states she is no longer having any chest pain and she is feeling good. Patient did not take any nitroglycerin yesterday. She states that her prescription is expired. Advised that I will send her in a new prescription. Educated patient on how to use nitro for chest pain. She will call back if symptoms reoccur. Will route to Dr. Harrington Challenger to make her aware and for any further advisement.

## 2021-06-16 NOTE — Telephone Encounter (Signed)
Patient was returning call 

## 2021-06-21 NOTE — Telephone Encounter (Signed)
Agree with plan   Monitor if recurs  Has NTG

## 2021-06-28 ENCOUNTER — Ambulatory Visit (INDEPENDENT_AMBULATORY_CARE_PROVIDER_SITE_OTHER): Payer: Medicare Other | Admitting: Internal Medicine

## 2021-06-28 ENCOUNTER — Encounter: Payer: Self-pay | Admitting: Internal Medicine

## 2021-06-28 ENCOUNTER — Other Ambulatory Visit: Payer: Self-pay

## 2021-06-28 VITALS — BP 126/90 | HR 64 | Resp 18 | Ht 66.0 in | Wt 134.2 lb

## 2021-06-28 DIAGNOSIS — R0789 Other chest pain: Secondary | ICD-10-CM | POA: Diagnosis not present

## 2021-06-28 DIAGNOSIS — E118 Type 2 diabetes mellitus with unspecified complications: Secondary | ICD-10-CM | POA: Diagnosis not present

## 2021-06-28 DIAGNOSIS — N1832 Chronic kidney disease, stage 3b: Secondary | ICD-10-CM

## 2021-06-28 DIAGNOSIS — Z23 Encounter for immunization: Secondary | ICD-10-CM | POA: Diagnosis not present

## 2021-06-28 LAB — COMPREHENSIVE METABOLIC PANEL
ALT: 10 U/L (ref 0–35)
AST: 22 U/L (ref 0–37)
Albumin: 3.9 g/dL (ref 3.5–5.2)
Alkaline Phosphatase: 83 U/L (ref 39–117)
BUN: 25 mg/dL — ABNORMAL HIGH (ref 6–23)
CO2: 30 mEq/L (ref 19–32)
Calcium: 9.8 mg/dL (ref 8.4–10.5)
Chloride: 104 mEq/L (ref 96–112)
Creatinine, Ser: 1.21 mg/dL — ABNORMAL HIGH (ref 0.40–1.20)
GFR: 41.51 mL/min — ABNORMAL LOW (ref >60.00)
Glucose, Bld: 222 mg/dL — ABNORMAL HIGH (ref 70–99)
Potassium: 4.1 mEq/L (ref 3.5–5.1)
Sodium: 140 mEq/L (ref 135–145)
Total Bilirubin: 0.5 mg/dL (ref 0.2–1.2)
Total Protein: 6.7 g/dL (ref 6.0–8.3)

## 2021-06-28 LAB — HEMOGLOBIN A1C: Hgb A1c MFr Bld: 7 % — ABNORMAL HIGH (ref 4.6–6.5)

## 2021-06-28 NOTE — Progress Notes (Signed)
   Subjective:   Patient ID: Monique [name], female    DOB: 12-07-1935, 85 y.o.   MRN: [id number]  HPI The patient is an 85 YO female coming in for follow up medical conditions.   Review of Systems  Constitutional: Negative.   HENT: Negative.    Eyes: Negative.   Respiratory:  Positive for chest tightness. Negative for cough and shortness of breath.   Cardiovascular:  Negative for chest pain, palpitations and leg swelling.  Gastrointestinal:  Negative for abdominal distention, abdominal pain, constipation, diarrhea, nausea and vomiting.  Musculoskeletal: Negative.   Skin: Negative.   Neurological: Negative.   Psychiatric/Behavioral: Negative.     Objective:  Physical Exam Constitutional:      Appearance: She is well-developed.  HENT:     Head: Normocephalic and atraumatic.  Cardiovascular:     Rate and Rhythm: Normal rate and regular rhythm.  Pulmonary:     Effort: Pulmonary effort is normal. No respiratory distress.     Breath sounds: Normal breath sounds. No wheezing or rales.  Abdominal:     General: Bowel sounds are normal. There is no distension.     Palpations: Abdomen is soft.     Tenderness: There is no abdominal tenderness. There is no rebound.  Musculoskeletal:     Cervical back: Normal range of motion.  Skin:    General: Skin is warm and dry.  Neurological:     Mental Status: She is alert and oriented to person, place, and time.     Coordination: Coordination normal.    Vitals:   06/28/21 1000  BP: 126/90  Pulse: 64  Resp: 18  SpO2: 99%  Weight: 134 lb 3.2 oz (60.9 kg)  Height: 5\' 6"  (1.676 m)    This visit occurred during the SARS-CoV-2 public health emergency.  Safety protocols were in place, including screening questions prior to the visit, additional usage of staff PPE, and extensive cleaning of exam room while observing appropriate contact time as indicated for disinfecting solutions.   Assessment & Plan:  Flu shot given at visit

## 2021-06-28 NOTE — Assessment & Plan Note (Signed)
Still having symptoms as recently as last night. Did not try nitro for this. Called cardiology to inform and they did refill this. She does have it now and asked her to use for symptoms. Recent stress testing April 2022 without indication of ischemic cause. She does have stable hypertrophic cardiomyopathy. Taking metoprolol 50 mg BID.

## 2021-06-28 NOTE — Assessment & Plan Note (Signed)
Checking HgA1c and CMP and adjust as needed. Previous check had some worsening of renal function and if persistent may need to stop or change metformin 500 mg daily. Adjust as needed.

## 2021-06-28 NOTE — Assessment & Plan Note (Signed)
Checking CMP as she has some worsening last lab tests. Does have HTN and DM both controlled. She is not on ACE-I/ARB.

## 2021-06-28 NOTE — Patient Instructions (Signed)
We will check the labs today. 

## 2021-07-04 ENCOUNTER — Other Ambulatory Visit: Payer: Self-pay | Admitting: Internal Medicine

## 2021-07-04 DIAGNOSIS — R079 Chest pain, unspecified: Secondary | ICD-10-CM

## 2021-07-21 DIAGNOSIS — Z1231 Encounter for screening mammogram for malignant neoplasm of breast: Secondary | ICD-10-CM | POA: Diagnosis not present

## 2021-07-23 ENCOUNTER — Other Ambulatory Visit: Payer: Self-pay | Admitting: Physician Assistant

## 2021-07-23 NOTE — Telephone Encounter (Signed)
Prescription refill request for Xarelto received.  Indication:Afib Last office visit:3/22 Weight:60.9 kg Age:85 Scr:1.2 CrCl:34.15 ml/min  Prescription refilled

## 2021-07-31 ENCOUNTER — Other Ambulatory Visit: Payer: Self-pay | Admitting: Internal Medicine

## 2021-08-01 ENCOUNTER — Other Ambulatory Visit: Payer: Self-pay | Admitting: Internal Medicine

## 2021-08-07 NOTE — Telephone Encounter (Signed)
Prolia VOB initiated via [email or web address]  Last OV:  Next OV:  Last Prolia inj: NEW START Next Prolia inj DUE:   

## 2021-08-07 NOTE — Telephone Encounter (Addendum)
Attempted to process benefits, unable to process at this time.

## 2021-08-12 NOTE — Telephone Encounter (Signed)
Prior Auth required for Prolia  PA PROCESS DETAILS: Please complete the prior authorization form located at [email or web address] or call [phone].

## 2021-08-26 NOTE — Telephone Encounter (Signed)
Pt ready for scheduling on or after 08/26/21 IF scheduled AFTER 09/04/21, benefits will need to be re-run prior to Prolia inj.   Out-of-pocket cost due at time of visit: $305  Primary: [employer] Medicare Prolia co-insurance: 20% (approximately $270) Admin fee co-insurance: $35  Secondary: n/a Prolia co-insurance:  Admin fee co-insurance:   Deductible: does not apply  Prior Auth: not required Reference #: [id number]   ** This summary of benefits is an estimation of the patient's out-of-pocket cost. Exact cost may vary based on individual plan coverage.

## 2021-08-26 NOTE — Telephone Encounter (Signed)
Prior auth not required per [facility] provider portal Reference #: [id number]

## 2021-08-31 ENCOUNTER — Encounter: Payer: Self-pay | Admitting: Internal Medicine

## 2021-08-31 DIAGNOSIS — R928 Other abnormal and inconclusive findings on diagnostic imaging of breast: Secondary | ICD-10-CM | POA: Diagnosis not present

## 2021-09-09 NOTE — Telephone Encounter (Signed)
LVM to schedule Prolia injection. If pt calls office, please schedule appt.

## 2021-09-10 ENCOUNTER — Telehealth: Payer: Self-pay

## 2021-09-10 NOTE — Telephone Encounter (Signed)
Error

## 2021-09-10 NOTE — Telephone Encounter (Signed)
Pt called back in to get scheduled for the Prolia inj. She is scheduled for 10/06/21 8:40

## 2021-09-13 ENCOUNTER — Other Ambulatory Visit: Payer: Self-pay | Admitting: Radiology

## 2021-09-13 ENCOUNTER — Encounter: Payer: Self-pay | Admitting: Internal Medicine

## 2021-09-13 DIAGNOSIS — R928 Other abnormal and inconclusive findings on diagnostic imaging of breast: Secondary | ICD-10-CM | POA: Diagnosis not present

## 2021-09-13 DIAGNOSIS — N6489 Other specified disorders of breast: Secondary | ICD-10-CM | POA: Diagnosis not present

## 2021-09-13 DIAGNOSIS — N6011 Diffuse cystic mastopathy of right breast: Secondary | ICD-10-CM | POA: Diagnosis not present

## 2021-09-13 DIAGNOSIS — N6311 Unspecified lump in the right breast, upper outer quadrant: Secondary | ICD-10-CM | POA: Diagnosis not present

## 2021-09-13 DIAGNOSIS — N6042 Mammary duct ectasia of left breast: Secondary | ICD-10-CM | POA: Diagnosis not present

## 2021-09-13 NOTE — Telephone Encounter (Signed)
VOB initiated

## 2021-09-21 NOTE — Telephone Encounter (Signed)
Prior auth required for PROLIA  PA PROCESS DETAILS: Please complete the prior authorization form located at [email or web address] or call [phone]  

## 2021-10-04 ENCOUNTER — Other Ambulatory Visit: Payer: Self-pay | Admitting: Internal Medicine

## 2021-10-04 DIAGNOSIS — I421 Obstructive hypertrophic cardiomyopathy: Secondary | ICD-10-CM

## 2021-10-04 DIAGNOSIS — I259 Chronic ischemic heart disease, unspecified: Secondary | ICD-10-CM

## 2021-10-04 DIAGNOSIS — E782 Mixed hyperlipidemia: Secondary | ICD-10-CM

## 2021-10-06 ENCOUNTER — Ambulatory Visit (INDEPENDENT_AMBULATORY_CARE_PROVIDER_SITE_OTHER): Payer: Medicare Other

## 2021-10-06 ENCOUNTER — Other Ambulatory Visit: Payer: Self-pay

## 2021-10-06 DIAGNOSIS — M810 Age-related osteoporosis without current pathological fracture: Secondary | ICD-10-CM

## 2021-10-06 MED ORDER — DENOSUMAB 60 MG/ML ~~LOC~~ SOSY
60.0000 mg | PREFILLED_SYRINGE | Freq: Once | SUBCUTANEOUS | Status: AC
  Administered 2021-10-06: 60 mg via SUBCUTANEOUS

## 2021-10-06 NOTE — Progress Notes (Signed)
Pt here for Prolia injection  given Sub Q and pt tolerated injection well.

## 2021-10-07 NOTE — Telephone Encounter (Signed)
Prior auth initiated via [facility] provider portal PA# [id number]

## 2021-10-09 NOTE — Telephone Encounter (Signed)
Prior [employer] APPROVED PA# [id number]

## 2021-10-09 NOTE — Telephone Encounter (Signed)
Pt ready for scheduling on or after 10/07/21  Out-of-pocket cost due at time of visit: $306  Primary: [facility] Medicare Prolia co-insurance: 20% (approximately $276) Admin fee co-insurance: $30  Secondary: n/a Prolia co-insurance:  Admin fee co-insurance:   Deductible: does not apply  Prior Auth: APPROVED PA# [id number] Valid: 10/07/21-10/07/22  ** This summary of benefits is an estimation of the patient's out-of-pocket cost. Exact cost may vary based on individual plan coverage.

## 2021-10-09 NOTE — Telephone Encounter (Signed)
Last Prolia inj 10/06/21 °Next Prolia inj due 04/06/22 °

## 2021-10-29 ENCOUNTER — Other Ambulatory Visit: Payer: Self-pay | Admitting: Internal Medicine

## 2021-10-31 ENCOUNTER — Other Ambulatory Visit: Payer: Self-pay | Admitting: Internal Medicine

## 2021-10-31 DIAGNOSIS — I259 Chronic ischemic heart disease, unspecified: Secondary | ICD-10-CM

## 2021-10-31 DIAGNOSIS — E782 Mixed hyperlipidemia: Secondary | ICD-10-CM

## 2021-10-31 DIAGNOSIS — I421 Obstructive hypertrophic cardiomyopathy: Secondary | ICD-10-CM

## 2021-11-02 ENCOUNTER — Ambulatory Visit: Payer: Medicare Other | Admitting: Internal Medicine

## 2021-11-04 ENCOUNTER — Encounter: Payer: Self-pay | Admitting: Internal Medicine

## 2021-11-04 ENCOUNTER — Ambulatory Visit (INDEPENDENT_AMBULATORY_CARE_PROVIDER_SITE_OTHER): Payer: Medicare Other | Admitting: Internal Medicine

## 2021-11-04 ENCOUNTER — Other Ambulatory Visit: Payer: Self-pay

## 2021-11-04 VITALS — BP 124/70 | HR 58 | Resp 18 | Ht 66.0 in | Wt 132.4 lb

## 2021-11-04 DIAGNOSIS — R0789 Other chest pain: Secondary | ICD-10-CM | POA: Diagnosis not present

## 2021-11-04 DIAGNOSIS — M79602 Pain in left arm: Secondary | ICD-10-CM

## 2021-11-04 DIAGNOSIS — I10 Essential (primary) hypertension: Secondary | ICD-10-CM | POA: Diagnosis not present

## 2021-11-04 DIAGNOSIS — E118 Type 2 diabetes mellitus with unspecified complications: Secondary | ICD-10-CM

## 2021-11-04 DIAGNOSIS — E1169 Type 2 diabetes mellitus with other specified complication: Secondary | ICD-10-CM | POA: Diagnosis not present

## 2021-11-04 DIAGNOSIS — N1832 Chronic kidney disease, stage 3b: Secondary | ICD-10-CM | POA: Diagnosis not present

## 2021-11-04 DIAGNOSIS — G8929 Other chronic pain: Secondary | ICD-10-CM | POA: Insufficient documentation

## 2021-11-04 DIAGNOSIS — E785 Hyperlipidemia, unspecified: Secondary | ICD-10-CM

## 2021-11-04 LAB — COMPREHENSIVE METABOLIC PANEL
ALT: 10 U/L (ref 0–35)
AST: 20 U/L (ref 0–37)
Albumin: 4 g/dL (ref 3.5–5.2)
Alkaline Phosphatase: 88 U/L (ref 39–117)
BUN: 27 mg/dL — ABNORMAL HIGH (ref 6–23)
CO2: 29 mEq/L (ref 19–32)
Calcium: 9.3 mg/dL (ref 8.4–10.5)
Chloride: 104 mEq/L (ref 96–112)
Creatinine, Ser: 1.33 mg/dL — ABNORMAL HIGH (ref 0.40–1.20)
GFR: 36.97 mL/min — ABNORMAL LOW (ref >60.00)
Glucose, Bld: 159 mg/dL — ABNORMAL HIGH (ref 70–99)
Potassium: 3.9 mEq/L (ref 3.5–5.1)
Sodium: 140 mEq/L (ref 135–145)
Total Bilirubin: 0.5 mg/dL (ref 0.2–1.2)
Total Protein: 6.9 g/dL (ref 6.0–8.3)

## 2021-11-04 LAB — MICROALBUMIN / CREATININE URINE RATIO
Creatinine,U: 78.4 mg/dL
Microalb Creat Ratio: 2.2 mg/g (ref 0.0–30.0)
Microalb, Ur: 1.7 mg/dL (ref 0.0–1.9)

## 2021-11-04 LAB — CBC
HCT: 39.3 % (ref 36.0–46.0)
Hemoglobin: 13 g/dL (ref 12.0–15.0)
MCHC: 33.1 g/dL (ref 30.0–36.0)
MCV: 93.9 fl (ref 78.0–100.0)
Platelets: 188 10*3/uL (ref 150.0–400.0)
RBC: 4.19 Mil/uL (ref 3.87–5.11)
RDW: 14.3 % (ref 11.5–15.5)
WBC: 7.4 10*3/uL (ref 4.0–10.5)

## 2021-11-04 LAB — LIPID PANEL
Cholesterol: 125 mg/dL (ref 0–200)
HDL: 60.1 mg/dL (ref >39.00)
LDL Cholesterol: 53 mg/dL (ref 0–99)
NonHDL: 65.06
Total CHOL/HDL Ratio: 2
Triglycerides: 60 mg/dL (ref 0.0–149.0)
VLDL: 12 mg/dL (ref 0.0–40.0)

## 2021-11-04 LAB — HEMOGLOBIN A1C: Hgb A1c MFr Bld: 7.1 % — ABNORMAL HIGH (ref 4.6–6.5)

## 2021-11-04 NOTE — Assessment & Plan Note (Signed)
Needs updated labs today. Checking CMP and lipid panel. Adjust lipitor 20 mg daily for LDL goal <100. ?

## 2021-11-04 NOTE — Patient Instructions (Addendum)
We have checked the EKG today which is not changed. I would recommend to call and make a visit with your heart doctor for follow up. ? ? ?

## 2021-11-04 NOTE — Assessment & Plan Note (Signed)
Foot exam done, checking HgA1c, lipid panel and microalbumin to creatinine ratio. She is taking metformin 500 mg BID. Needs CMP and depending on GFR may need adjustment. She is not on ACE-I/ARB so adjust as needed. She is on a statin lipitor 20 mg daily. Reminded about yearly eye exam. ?

## 2021-11-04 NOTE — Assessment & Plan Note (Signed)
BP is at goal today on amlodipine 5 mg BID and metoprolol 50 mg BID and is due for CMP given CKD so this is ordered today as well as microalbumin ratio. Adjust as needed.  ?

## 2021-11-04 NOTE — Progress Notes (Signed)
? ?  Subjective:  ? ?Patient ID: Monique [name], female    DOB: [birth year], 86 y.o.   MRN: [id number] ? ?HPI ?The patient is an 86 YO female coming in for pain.  ? ?Review of Systems  ?Constitutional: Negative.   ?HENT: Negative.    ?Eyes: Negative.   ?Respiratory:  Negative for cough, chest tightness and shortness of breath.   ?Cardiovascular:  Positive for chest pain. Negative for palpitations and leg swelling.  ?Gastrointestinal:  Negative for abdominal distention, abdominal pain, constipation, diarrhea, nausea and vomiting.  ?Musculoskeletal:  Positive for myalgias.  ?Skin: Negative.   ?Neurological: Negative.   ?Psychiatric/Behavioral: Negative.    ? ?Objective:  ?Physical Exam ?Constitutional:   ?   Appearance: She is well-developed.  ?HENT:  ?   Head: Normocephalic and atraumatic.  ?Cardiovascular:  ?   Rate and Rhythm: Normal rate and regular rhythm.  ?Pulmonary:  ?   Effort: Pulmonary effort is normal. No respiratory distress.  ?   Breath sounds: Normal breath sounds. No wheezing or rales.  ?Chest:  ?   Chest wall: No tenderness.  ?Abdominal:  ?   General: Bowel sounds are normal. There is no distension.  ?   Palpations: Abdomen is soft.  ?   Tenderness: There is no abdominal tenderness. There is no rebound.  ?Musculoskeletal:  ?   Cervical back: Normal range of motion.  ?Skin: ?   General: Skin is warm and dry.  ?Neurological:  ?   Mental Status: She is alert and oriented to person, place, and time.  ?   Coordination: Coordination normal.  ? ? ?Vitals:  ? 11/04/21 0836  ?BP: 124/70  ?Pulse: (!) 58  ?Resp: 18  ?SpO2: 99%  ?Weight: 132 lb 6.4 oz (60.1 kg)  ?Height: 5\' 6"  (1.676 m)  ? ?EKG: Rate 54, axis left, interval normal, sinus brady, repolarization changes stable with LVH, no significant change compared to prior 2022 ? ? ?This visit occurred during the SARS-CoV-2 public health emergency.  Safety protocols were in place, including screening questions prior to the visit, additional usage of staff PPE, and  extensive cleaning of exam room while observing appropriate contact time as indicated for disinfecting solutions.  ? ?Assessment & Plan:  ? ?

## 2021-11-04 NOTE — Assessment & Plan Note (Signed)
She did take nitro for this episode and this did not help make cardiac origin less likely. Reviewed normal stress test results from 12/2020. She is overdue for cardiology follow up and asked her to schedule this. EKG done today which is normal. Given that this episode was at least 2 weeks ago we will not check labs today.  ?

## 2021-11-04 NOTE — Assessment & Plan Note (Signed)
Needs follow up CMP today given CKD. Diabetes and hypertension both controlled.  ?

## 2021-11-04 NOTE — Assessment & Plan Note (Signed)
EKG done to rule out cardiac changes, muscles tight on exam in the left biceps region which is likely cause. She cannot recall lifting anything. Advised on stretching exercises. EKG stable today.  ?

## 2021-11-08 ENCOUNTER — Telehealth: Payer: Self-pay | Admitting: Internal Medicine

## 2021-11-08 NOTE — Telephone Encounter (Signed)
Pt c/o of Chest Pain: STAT if CP now or developed within 24 hours ? ?1. Are you having CP right now? Yes  ? ?2. Are you experiencing any other symptoms (ex. SOB, nausea, vomiting, sweating)? No  ? ?3. How long have you been experiencing CP? Since last month  ? ?4. Is your CP continuous or coming and going? Coming and going ? ?5. Have you taken Nitroglycerin? Yes ? ?Have dull pain under left breast and left arm  ??  ?

## 2021-11-08 NOTE — Telephone Encounter (Signed)
Spoke with the patient who reports intermittent chest pain behind her left breast. She reports it as a dull pain. Denies any shortness of breath. Reports pain in her left arm at times. Pain is similar to what she has had in the past. Pain is not associated with exertion. She has used nitroglycerin once for her pain without any relief. Denies current chest pain.  ?She also reports some swelling in her ankles. She denies increased sodium in her diet. She does elevate her legs during the day.  ?

## 2021-11-08 NOTE — Telephone Encounter (Signed)
Spoke with the pt and made her an appt for 11/10/21. She will have someone take her to the  ED if her pain returns and or worsens she will not wait until her appt.  ?

## 2021-11-09 NOTE — Progress Notes (Signed)
Cardiology Office Note    Date:  11/10/2021   ID:  Monique [name], Monique [name] Nov 12, 1935, MRN [id number]  PCP:  Hoyt Koch, MD  Cardiologist: Dr. Harrington Challenger  Chief Complaint: Follow up for HOCM & Afib  History of Present Illness:   Monique [name] is a 86 y.o. female with hx of HOCM, HTN, PAF, bilateral PE, DVT, CKD stage III and HLD presents for follow up.  The patient has had many episodes of chest pain in the past.  She has undergone several stress test (nuclear, 2016, 2019).  In April 2022 she had an MRI stress test ordered by Richardson Dopp.  This showed a normal hyperdynamic response to stress.  MRI showed a ventricular septum measuring at 15 mm.  There was less than 1% late gadolinium enhancement noted at the septum base.  LVEF 72%.  In 2020 the patient was admitted with chest pain.  Note she developed SVT during the admission.  She was placed back down to is a provide and metoprolol.  The patient was last in clinic back last spring.  She says about a month ago she started having intermittent chest pains.  Episodes occur with and without activity.  They can come and go throughout the day.  They do not occur every day.  Again there is nothing that makes them better or worse.  No specific time of day that they happen.  IPt still gets CP   Occurs with and without activity Comes and goes     Can go and com  Last month started  Didn't hurt a all yesterda   Diet Breakfast: Eggs, toast with jelly, bacon.\ Lunch: Salad.  No protein.\ Dinner: Carrots, green beans, chicken breast. Drinks: Occasional sweet tea.  Otherwise water. Past Medical History:  Diagnosis Date   Abdominal pain 11/18/2015   Acute respiratory failure with hypoxia ([place]) 06/27/2014   Breast pain, left 10/11/2016   Cerebral aneurysm 2000 06/27/2014   Chest pain 10/30/2014   CHF (congestive heart failure) ([place])    Chronic anticoagulation 06/2014   Coumadin started after bilateral PE   Chronic kidney disease, stage III  (moderate) ([place]) 11/21/2014   Clotting disorder ([place])    right was removed, still have left cataract   Diabetes ([place])    Dizziness and giddiness 11/28/2014   DVT of lower extremity (deep venous thrombosis) ([place]) 11/28/2014   Dysuria 07/07/2014   Elevated troponin 11/28/2014   Fatty liver    Gastritis 05/16/2012   Chronic dyspepsia on PPI daily.    Headache, acute 06/08/2010   Qualifier: Diagnosis of  By: Linda Hedges MD, Heinz Knuckles    Heart murmur    Hematochezia 11/18/2015   History of fibromuscular dysplasia 06/27/2014   HTN (hypertension)    Essential   Hyperlipidemia    Hypertrophic obstructive cardiomyopathy (HOCM) ([place]) 05/20/2008   CMR 4/22: EF 72, small amount of patchy LGE in basal septum c/w HCM, asymmetric basal septal hypertrophy (15 mm) c/w HCM, no ischemia, ascending aorta 38 mm   Kidney stones    LVH (left ventricular hypertrophy)    Orthostatic hypotension 11/28/2014   PAF (paroxysmal atrial fibrillation) ([place]) 2000   Palpitations 11/28/2014   PAROXYSMAL ATRIAL FIBRILLATION 05/20/2008   Qualifier: Diagnosis of  By: Linda Hedges MD, Heinz Knuckles    PE (pulmonary embolism) 06/2014   Bilateral   Postmenopausal osteoporosis 04/2019   T score -3.3   Pulmonary embolism, bilateral ([place]) 06/27/2014   SOB (shortness of breath) 06/27/2014   Subarachnoid  hemorrhage ([place]) 2000   UTI (urinary tract infection) 01/04/2015    Past Surgical History:  Procedure Laterality Date   ABDOMINAL HYSTERECTOMY  2000   Brain hemorrage     2000   CARDIAC CATHETERIZATION  05/2008   Nl cors, no gradient, EF 60%    Current Medications: Prior to Admission medications   Medication Sig Start Date End Date Taking? Authorizing Provider  amLODipine (NORVASC) 5 MG tablet Take 1 tablet (5 mg total) by mouth 2 (two) times daily. 12/02/16   Fay Records, MD  disopyramide (NORPACE) 150 MG capsule Take 1 capsule (150 mg total) by mouth 2 (two) times daily. 01/13/17   Fay Records, MD  metoprolol tartrate (LOPRESSOR) 25  MG tablet Take 1 tablet (25 mg total) by mouth 2 (two) times daily. 12/06/16   Fay Records, MD  mirtazapine (REMERON) 15 MG tablet TAKE 1 TABLET (15 MG TOTAL) BY MOUTH AT BEDTIME. 06/20/17   Hoyt Koch, MD  montelukast (SINGULAIR) 10 MG tablet Take 1 tablet (10 mg total) by mouth at bedtime. 06/20/17   Hoyt Koch, MD  nitroGLYCERIN (NITROSTAT) 0.4 MG SL tablet Place 1 tablet (0.4 mg total) under the tongue every 5 (five) minutes as needed for chest pain. 03/30/15   Fay Records, MD  pantoprazole (PROTONIX) 40 MG tablet Take 1 tablet (40 mg total) by mouth daily. Need annual visit of further refills 06/07/17   Hoyt Koch, MD  Rivaroxaban (XARELTO) 15 MG TABS tablet Take 1 tablet (15 mg total) by mouth daily with supper. 01/17/17   Fay Records, MD  sertraline (ZOLOFT) 50 MG tablet TAKE 1 TABLET (50 MG TOTAL) BY MOUTH DAILY. 02/24/15   Hoyt Koch, MD  simvastatin (ZOCOR) 40 MG tablet TAKE 1 TABLET (40 MG TOTAL) BY MOUTH AT BEDTIME. 01/13/17   Fay Records, MD    Allergies:   Patient has no known allergies.   Social History   Socioeconomic History   Marital status: Divorced    Spouse name: Not on file   Number of children: 4   Years of education: 12   Highest education level: Not on file  Occupational History   Occupation: Retired Doctor, hospital  Tobacco Use   Smoking status: Former    Types: Cigarettes    Quit date: 09/06/1967    Years since quitting: 54.2   Smokeless tobacco: Never  Vaping Use   Vaping Use: Never used  Substance and Sexual Activity   Alcohol use: No    Alcohol/week: 0.0 standard drinks   Drug use: No   Sexual activity: Not Currently    Comment: 1st intercourse 86yo-Fewer than 5 partners  Other Topics Concern   Not on file  Social History Narrative   HSG. Married '61 - '88/divorced. 3 sons - '74, '59, '62, 1 daughter '63; 5 grandchildren. 10/06/20 Lives alone in two story home.    She does not drive.   Retired from  [employer].   Social Determinants of Health   Financial Resource Strain: Low Risk    Difficulty of Paying Living Expenses: Not hard at all  Food Insecurity: No Food Insecurity   Worried About Charity fundraiser in the Last Year: Never true   [place] in the Last Year: Never true  Transportation Needs: No Transportation Needs   Lack of Transportation (Medical): No   Lack of Transportation (Non-Medical): No  Physical Activity: Sufficiently Active   Days of  Exercise per Week: 5 days   Minutes of Exercise per Session: 30 min  Stress: No Stress Concern Present   Feeling of Stress : Not at all  Social Connections: Moderately Integrated   Frequency of Communication with Friends and Family: More than three times a week   Frequency of Social Gatherings with Friends and Family: More than three times a week   Attends Religious Services: More than 4 times per year   Active Member of [employer] or Organizations: Yes   Attends Music therapist: More than 4 times per year   Marital Status: Divorced     Family History:  The patient's family history includes Arrhythmia in her mother and sister; Breast cancer (age of onset: 61) in her daughter; Breast cancer (age of onset: 53) in her mother; Cervical cancer in her sister; Colon cancer in her sister; Heart disease in her mother; Stroke in her mother.   ROS:   Please see the history of present illness.    ROS All other systems reviewed and are negative.   PHYSICAL EXAM:   VS:  BP 140/80    Pulse 60    Ht '5\' 6"'$  (1.676 m)    Wt 132 lb (59.9 kg)    SpO2 97%    BMI 21.31 kg/m    GEN: Thin 86 year old in no acute distress  HEENT: normal  Neck: JVP is normal   Cardiac: RRR;No singif murmurs   No LE edema   2+ DP pulses Chest: Slightly tender on L lateral chest area of chest pain is right under her bra line Respiratory:  clear to auscultation bilaterally, normal work of breathing GI: soft, nontender, nondistended, +  BS MS: no deformity or atrophy  Skin: warm and dry, no rash Neuro:  Alert and Oriented x 3, Strength and sensation are intact Psych: euthymic mood, full affect  Wt Readings from Last 3 Encounters:  11/10/21 132 lb (59.9 kg)  11/04/21 132 lb 6.4 oz (60.1 kg)  06/28/21 134 lb 3.2 oz (60.9 kg)      Studies/Labs Reviewed:   EKG:  EKG is ordered today   NSR   60 bpm   LVH   T wave inversion consistent with strain, cannot exlude ishcemia  Recent Labs: 11/04/2021: ALT 10; BUN 27; Creatinine, Ser 1.33; Hemoglobin 13.0; Platelets 188.0; Potassium 3.9; Sodium 140   Lipid Panel    Component Value Date/Time   CHOL 125 11/04/2021 0915   CHOL 140 11/11/2016 0900   TRIG 60.0 11/04/2021 0915   HDL 60.10 11/04/2021 0915   HDL 51 11/11/2016 0900   CHOLHDL 2 11/04/2021 0915   VLDL 12.0 11/04/2021 0915   LDLCALC 53 11/04/2021 0915   LDLCALC 52 04/17/2020 1451    Additional studies/ records that were reviewed today include:   Echo   Jan 2021  1. Left ventricular ejection fraction, by visual estimation, is 65 to 70%. The left ventricle has normal function. There is mildly increased left ventricular hypertrophy. 2. Elevated left atrial pressure. 3. Left ventricular diastolic parameters are consistent with Grade II diastolic dysfunction (pseudonormalization). 4. The left ventricle has no regional wall motion abnormalities. 5. Intracavitary gradient up to 5 mmHG. 6. Global right ventricle has normal systolic function.The right ventricular size is normal. No increase in right ventricular wall thickness. 7. Left atrial size was mildly dilated. 8. The mitral valve is grossly normal. Trivial mitral valve regurgitation. 9. The tricuspid valve is grossly normal. 10. The aortic valve is tricuspid. Aortic valve  regurgitation is not visualized. No evidence of aortic valve sclerosis or stenosis. 11. Mildly elevated pulmonary artery systolic pressure. 12. The tricuspid regurgitant velocity is 3.00  m/s, and with an assumed right atrial pressure of 10 mmHg, the estimated right ventricular systolic pressure is mildly elevated at 45.9 mmHg. In comparison to the previous echocardiogram(s): EF remains unchanged on this study. Intracavitary gradient ~5 mmHG on the present study, and noted to be up to 34 mmHG on the prior study. No obvious asymmetric hypertrophy or SAM on this study. Compared to 09/08/2017.  Myovue Dec 2020  The left ventricular ejection fraction is normal (55-65%). Nuclear stress EF: 65%. No T wave inversion was noted during stress. There was no ST segment deviation noted during stress. This is a low risk study.   Normal perfusion. LVEF 65% with normal wall motion. This is a low risk study.   ASSESSMENT & PLAN:   1  Chest pain atypical.  I do not think cardiac in origin.  She is anxious.  I would follow.  Try to loose bra a bit.  2 SVT.  Patient without palpitaitons   Follow   3. HTN BP is fair.  Keep on current regimen \  4. HOCM MRI as noted above  .  Very minor involvement of the myocardium.(<1% LGE)  5   PE  Continue Xarelto    6. HLD  Keep on current regimen   7  DM  Discussed diet  Cut back on carbs    F/U next winter   Medication Adjustments/Labs and Tests Ordered: Current medicines are reviewed at length with the patient today.  Concerns regarding medicines are outlined above.  Medication changes, Labs and Tests ordered today are listed in the Patient Instructions below. There are no Patient Instructions on file for this visit.   Signed, Dorris Carnes, MD  11/10/2021 10:04 AM    [address], [place], [place]  [address] Phone: [phone]; Fax: [phone]

## 2021-11-10 ENCOUNTER — Encounter: Payer: Self-pay | Admitting: Internal Medicine

## 2021-11-10 ENCOUNTER — Ambulatory Visit: Payer: Medicare Other | Admitting: Internal Medicine

## 2021-11-10 ENCOUNTER — Other Ambulatory Visit: Payer: Self-pay

## 2021-11-10 DIAGNOSIS — I421 Obstructive hypertrophic cardiomyopathy: Secondary | ICD-10-CM | POA: Diagnosis not present

## 2021-11-10 DIAGNOSIS — E782 Mixed hyperlipidemia: Secondary | ICD-10-CM | POA: Diagnosis not present

## 2021-11-10 DIAGNOSIS — I259 Chronic ischemic heart disease, unspecified: Secondary | ICD-10-CM | POA: Diagnosis not present

## 2021-11-10 MED ORDER — METOPROLOL TARTRATE 50 MG PO TABS: 50.0000 mg | ORAL_TABLET | Freq: Two times a day (BID) | ORAL | 3 refills | Status: DC

## 2021-11-10 NOTE — Patient Instructions (Signed)

## 2021-11-26 ENCOUNTER — Other Ambulatory Visit: Payer: Self-pay | Admitting: Physician Assistant

## 2021-12-01 ENCOUNTER — Telehealth: Payer: Self-pay | Admitting: Internal Medicine

## 2021-12-01 ENCOUNTER — Telehealth: Payer: Medicare Other

## 2021-12-01 NOTE — Telephone Encounter (Signed)
Patient states she still feel tightness behind her left breast. She can feel her heart beating.  ?Was able to schd her for with [place] on 4/5 at 10:55. Patient wanted to make Dr. Harrington Challenger aware. Please advise ?

## 2021-12-01 NOTE — Telephone Encounter (Signed)
Attempted to call the pt several times but keep getting a busy signal.. will continue to try the pt back.  ?

## 2021-12-05 NOTE — Progress Notes (Signed)
? ? ?Office Visit  ?  ?Patient Name: Monique [name] ?Date of Encounter: 12/07/2021 ? ?Primary Care Provider:  Hoyt Koch, MD ?Primary Cardiologist:  Dorris Carnes, MD ?Primary Electrophysiologist: None ?Chief Complaint  ?  ?Patient presents today for complaint of tightness behind the left breast ? ? Patient Profile: ?HOCM ?Paroxysmal atrial fibrillation ?HTN ?CKD stage III ?Bilateral PE ?DVT ?HLD ? ? Recent Studies: ?09/2017>2D echo: EF 65-70%, with asymmetrical LVH, mild S.A.M. and hyperdynamic systolic function.  Grade 1 DD, aortic valve with mean gradient 21 mmHg, ?N1/2019>uclear stress test: Low risk study with normal perfusion EF 64%, small defect of mild severity at apical lateral location. ?09/2019>2D echo: EF 65-70%, normal LV function, mildly increased LVH, no RWMA and LV, mildly dilated LA, mildly elevated pulmonary artery systolic pressure, TV regurg velocity 4.4I/H, RV systolic function elevated at 45.9 mmHg ?12/2020>MRI cardiac stress test: EF of 72%, normal stress and rest perfusion, no evidence of ischemia on stress ? ?History of Present Illness  ?  ?Monique [name] is a 86 y.o. female with PMH of HOCM, HTN, PAF, bilateral PE, DVT, HLD, and CKD stage III presents for complaint of tightness behind left breast.  She was last seen by Dr. Harrington Challenger on 3/23 and had complaints of intermittent chest pain and sensation of feeling heartbeat.  This was thought to be not cardiac in origin and patient was advised to try looser undergarments.  Patient contacted office on 3/29 with continued complaints and presents today for follow-up. ? ?Since last being seen in our clinic [name] reports that she is doing well but has complaint of occasional left-sided chest pain under breast and mid sternum.  She describes the pain as coming and going back is like a pinch at times.  The pain is with or without activity and is sore to palpitation.  She denies any stressors at home currently.  Pain is alleviated with Tylenol  and with nitroglycerin..She denies palpitations, dyspnea, PND, orthopnea, nausea, vomiting, dizziness, syncope, edema, weight gain, or early satiety.  She walks daily to her mailbox and states that the pain does not prevent her from completing activity. ?Past Medical History  ?  ?Past Medical History:  ?Diagnosis Date  ? Abdominal pain 11/18/2015  ? Acute respiratory failure with hypoxia ([place]) 06/27/2014  ? Breast pain, left 10/11/2016  ? Cerebral aneurysm 2000 06/27/2014  ? Chest pain 10/30/2014  ? CHF (congestive heart failure) ([place])   ? Chronic anticoagulation 06/2014  ? Coumadin started after bilateral PE  ? Chronic kidney disease, stage III (moderate) ([place]) 11/21/2014  ? Clotting disorder ([place])   ? right was removed, still have left cataract  ? Diabetes ([place])   ? Dizziness and giddiness 11/28/2014  ? DVT of lower extremity (deep venous thrombosis) ([place]) 11/28/2014  ? Dysuria 07/07/2014  ? Elevated troponin 11/28/2014  ? Fatty liver   ? Gastritis 05/16/2012  ? Chronic dyspepsia on PPI daily.   ? Headache, acute 06/08/2010  ? Qualifier: Diagnosis of  By: Linda Hedges MD, Heinz Knuckles   ? Heart murmur   ? Hematochezia 11/18/2015  ? History of fibromuscular dysplasia 06/27/2014  ? HTN (hypertension)   ? Essential  ? Hyperlipidemia   ? Hypertrophic obstructive cardiomyopathy (HOCM) ([place]) 05/20/2008  ? CMR 4/22: EF 72, small amount of patchy LGE in basal septum c/w HCM, asymmetric basal septal hypertrophy (15 mm) c/w HCM, no ischemia, ascending aorta 38 mm  ? Kidney stones   ? LVH (left ventricular hypertrophy)   ?  Orthostatic hypotension 11/28/2014  ? PAF (paroxysmal atrial fibrillation) ([place]) 2000  ? Palpitations 11/28/2014  ? PAROXYSMAL ATRIAL FIBRILLATION 05/20/2008  ? Qualifier: Diagnosis of  By: Linda Hedges MD, Heinz Knuckles   ? PE (pulmonary embolism) 06/2014  ? Bilateral  ? Postmenopausal osteoporosis 04/2019  ? T score -3.3  ? Pulmonary embolism, bilateral ([place]) 06/27/2014  ? SOB (shortness of breath) 06/27/2014  ? Subarachnoid hemorrhage  ([place]) 2000  ? UTI (urinary tract infection) 01/04/2015  ? ?Past Surgical History:  ?Procedure Laterality Date  ? ABDOMINAL HYSTERECTOMY  2000  ? Brain hemorrage    ? 2000  ? CARDIAC CATHETERIZATION  05/2008  ? Nl cors, no gradient, EF 60%  ? ? ?Allergies ? ?No Known Allergies ? ?Home Medications  ?  ?Current Outpatient Medications  ?Medication Sig Dispense Refill  ? acetaminophen (TYLENOL) 500 MG tablet Take 1,000 mg by mouth every 6 (six) hours as needed for moderate pain or headache.    ? amLODipine (NORVASC) 5 MG tablet TAKE 1 TABLET BY MOUTH TWICE A DAY 180 tablet 3  ? atorvastatin (LIPITOR) 20 MG tablet TAKE 1 TABLET BY MOUTH DAILY AT 6 PM. 90 tablet 1  ? disopyramide (NORPACE) 150 MG capsule TAKE 1 CAPSULE BY MOUTH TWICE A DAY 180 capsule 1  ? lidocaine (LIDODERM) 5 % Place 1 patch onto the skin daily. 30 patch 11  ? metFORMIN (GLUCOPHAGE) 500 MG tablet TAKE 1 TABLET BY MOUTH EVERY DAY WITH BREAKFAST 90 tablet 1  ? metoprolol tartrate (LOPRESSOR) 50 MG tablet Take 1 tablet (50 mg total) by mouth 2 (two) times daily. 180 tablet 3  ? mirtazapine (REMERON) 15 MG tablet TAKE 1 TABLET BY MOUTH EVERYDAY AT BEDTIME 90 tablet 1  ? nitroGLYCERIN (NITROSTAT) 0.4 MG SL tablet Place 1 tablet (0.4 mg total) under the tongue every 5 (five) minutes as needed for chest pain. 75 tablet 1  ? pantoprazole (PROTONIX) 40 MG tablet TAKE 1 TABLET BY MOUTH EVERY DAY 90 tablet 1  ? trospium (SANCTURA) 20 MG tablet Take 1 tablet (20 mg total) by mouth 2 (two) times daily. 60 tablet 5  ? XARELTO 15 MG TABS tablet TAKE 1 TABLET BY MOUTH EVERY DAY WITH SUPPER 30 tablet 5  ? ?No current facility-administered medications for this visit.  ?  ? ?Review of Systems  ?Please see the history of present illness.    ?(+) Left-sided substernal chest pain ? ?All other systems reviewed and are otherwise negative except as noted above. ? ?Physical Exam  ?  ?Wt Readings from Last 3 Encounters:  ?12/07/21 131 lb (59.4 kg)  ?11/10/21 132 lb (59.9 kg)   ?11/04/21 132 lb 6.4 oz (60.1 kg)  ? ?VS: ?Vitals:  ? 12/07/21 1103  ?BP: 132/80  ?Pulse: (!) 56  ?SpO2: 99%  ?,Body mass index is 21.14 kg/m?. ? ?Constitutional:   ?   Appearance: Healthy appearance. Not in distress.  ?Neck:  ?   Vascular: JVD normal.  ?Pulmonary:  ?   Effort: Pulmonary effort is normal.  ?   Breath sounds: No wheezing. No rales.  ?Cardiovascular:  ?   Normal rate. Regular rhythm. Normal S1. Normal S2.   ?   Murmurs: There is no murmur.  ?Edema: ?   Peripheral edema absent.  ?Abdominal:  ?   Palpations: Abdomen is soft. There is no hepatomegaly.  ?Skin: ?   General: Skin is warm and dry.  ?Neurological:  ?   General: No focal deficit present.  ?  Mental Status: Alert and oriented to person, place and time.  ?   Cranial Nerves: Cranial nerves are intact.  ?EKG/LABS/Other Studies Reviewed  ?  ?ECG personally reviewed by me today -sinus bradycardia with left axis deviation and left ventricular hypertrophy with rate of 54- no acute changes. ? ?Risk Assessment/Calculations:   ? ?CHA2DS2-VASc Score = 4  ? This indicates a 4.8% annual risk of stroke. ?The patient's score is based upon: ?CHF History: 0 ?HTN History: 1 ?Diabetes History: 0 ?Stroke History: 0 ?Vascular Disease History: 0 ?Age Score: 2 ?Gender Score: 1 ?  ? ? ?Lab Results  ?Component Value Date  ? WBC 7.4 11/04/2021  ? HGB 13.0 11/04/2021  ? HCT 39.3 11/04/2021  ? MCV 93.9 11/04/2021  ? PLT 188.0 11/04/2021  ? ?Lab Results  ?Component Value Date  ? CREATININE 1.33 (H) 11/04/2021  ? BUN 27 (H) 11/04/2021  ? NA 140 11/04/2021  ? K 3.9 11/04/2021  ? CL 104 11/04/2021  ? CO2 29 11/04/2021  ? ?Lab Results  ?Component Value Date  ? ALT 10 11/04/2021  ? AST 20 11/04/2021  ? ALKPHOS 88 11/04/2021  ? BILITOT 0.5 11/04/2021  ? ?Lab Results  ?Component Value Date  ? CHOL 125 11/04/2021  ? HDL 60.10 11/04/2021  ? [place] 53 11/04/2021  ? TRIG 60.0 11/04/2021  ? CHOLHDL 2 11/04/2021  ?  ?Lab Results  ?Component Value Date  ? HGBA1C 7.1 (H) 11/04/2021   ? ? ?Assessment & Plan  ?  ?1.  Atypical chest pain: ?-Patient's current pain described as tightness behind left breast.  She describes the pain as bothersome and comes and goes without any particular activity

## 2021-12-07 ENCOUNTER — Encounter (HOSPITAL_BASED_OUTPATIENT_CLINIC_OR_DEPARTMENT_OTHER): Payer: Self-pay | Admitting: Nurse Practitioner

## 2021-12-07 ENCOUNTER — Ambulatory Visit (HOSPITAL_BASED_OUTPATIENT_CLINIC_OR_DEPARTMENT_OTHER): Payer: Medicare Other | Admitting: Nurse Practitioner

## 2021-12-07 VITALS — BP 132/80 | HR 56 | Ht 66.0 in | Wt 131.0 lb

## 2021-12-07 DIAGNOSIS — E782 Mixed hyperlipidemia: Secondary | ICD-10-CM

## 2021-12-07 DIAGNOSIS — I421 Obstructive hypertrophic cardiomyopathy: Secondary | ICD-10-CM | POA: Diagnosis not present

## 2021-12-07 DIAGNOSIS — I10 Essential (primary) hypertension: Secondary | ICD-10-CM | POA: Diagnosis not present

## 2021-12-07 DIAGNOSIS — R072 Precordial pain: Secondary | ICD-10-CM

## 2021-12-07 DIAGNOSIS — I480 Paroxysmal atrial fibrillation: Secondary | ICD-10-CM

## 2021-12-07 DIAGNOSIS — I471 Supraventricular tachycardia, unspecified: Secondary | ICD-10-CM

## 2021-12-07 DIAGNOSIS — I2699 Other pulmonary embolism without acute cor pulmonale: Secondary | ICD-10-CM | POA: Diagnosis not present

## 2021-12-07 DIAGNOSIS — E118 Type 2 diabetes mellitus with unspecified complications: Secondary | ICD-10-CM

## 2021-12-07 NOTE — Patient Instructions (Addendum)
Medication Instructions:  ?Your Physician recommend you continue on your current medication as directed.   ? ? ?*If you need a refill on your cardiac medications before your next appointment, please call your pharmacy* ? ? ?Lab Work: ?Your physician recommends that you return for lab work today- BMET and sed. Rate  ? ?If you have labs (blood work) drawn today and your tests are completely normal, you will receive your results only by: ?MyChart Message (if you have MyChart) OR ?A paper copy in the mail ?If you have any lab test that is abnormal or we need to change your treatment, we will call you to review the results. ? ? ?Testing/Procedures: ? ? ?Your cardiac CT will be scheduled at one of the below locations:  ? ?[facility] ?[address] ?[place], [place] [address] ?[phone] ? ? ?If scheduled at [facility], please arrive at the [facility] and Children's Entrance (Entrance C2) of [facility] 30 minutes prior to test start time. ?You can use the FREE valet parking offered at entrance C (encouraged to control the heart rate for the test)  ?Proceed to the [facility] Radiology Department (first floor) to check-in and test prep. ? ?All radiology patients and guests should use entrance C2 at [facility], accessed from [facility], even though the hospital's physical address listed is [address] ? ? ? ?Please follow these instructions carefully (unless otherwise directed): ? ?On the Night Before the Test: ?Be sure to Drink plenty of water. ?Do not consume any caffeinated/decaffeinated beverages or chocolate 12 hours prior to your test. ?Do not take any antihistamines 12 hours prior to your test. ? ?On the Day of the Test: ?Drink plenty of water until 1 hour prior to the test. ?Do not eat any food 4 hours prior to the test. ?You may take your regular medications prior to the test.  ?FEMALES- please wear underwire-free bra if available, avoid dresses &  tight clothing ? ?After the Test: ?Drink plenty of water. ?After receiving IV contrast, you may experience a mild flushed feeling. This is normal. ?On occasion, you may experience a mild rash up to 24 hours after the test. This is not dangerous. If this occurs, you can take Benadryl 25 mg and increase your fluid intake. ?If you experience trouble breathing, this can be serious. If it is severe call 911 IMMEDIATELY. If it is mild, please call our office. ?If you take any of these medications: Glipizide/Metformin, Avandament, Glucavance, please do not take 48 hours after completing test unless otherwise instructed. ? ?We will call to schedule your test 2-4 weeks out understanding that some insurance companies will need an authorization prior to the service being performed.  ? ?For non-scheduling related questions, please contact the cardiac imaging nurse navigator should you have any questions/concerns: ?Marchia Bond, Cardiac Imaging Nurse Navigator ?Gordy Clement, Cardiac Imaging Nurse Navigator ?[facility] ?Direct Office Dial: [phone]  ? ?For scheduling needs, including cancellations and rescheduling, please call [place], [phone]. ? ? ? ?Follow-Up: ?At [facility], you and your health needs are our priority.  As part of our continuing mission to provide you with exceptional heart care, we have created designated Provider Care Teams.  These Care Teams include your primary Cardiologist (physician) and Advanced Practice Providers (APPs -  Physician Assistants and Nurse Practitioners) who all work together to provide you with the care you need, when you need it. ? ?We recommend signing up  for the patient portal called "MyChart".  Sign up information is provided on this After Visit Summary.  MyChart is used to connect with patients for Virtual Visits (Telemedicine).  Patients are able to view lab/test results, encounter notes, upcoming appointments, etc.  Non-urgent messages can  be sent to your provider as well.   ?To learn more about what you can do with MyChart, go to [email or web address].   ? ?Your next appointment:   ?2 month(s) ? ?The format for your next appointment:   ?In Person ? ?Provider:  Dorris Carnes, MD  ? ? ?Other instructions:  ?Your chest pain is concerning for muscle related pain, testing ordered today to ensure nothing more serious. Recommend tylenol and heat pack as needed for discomfort.  ? ?

## 2021-12-07 NOTE — Telephone Encounter (Signed)
Pt has an appt with Ambrose Pancoast PA 12/07/21.  ?

## 2021-12-08 ENCOUNTER — Telehealth (HOSPITAL_BASED_OUTPATIENT_CLINIC_OR_DEPARTMENT_OTHER): Payer: Self-pay

## 2021-12-08 LAB — C-REACTIVE PROTEIN: CRP: <1 mg/L (ref 0–10)

## 2021-12-08 LAB — BASIC METABOLIC PANEL
BUN/Creatinine Ratio: 19 (ref 12–28)
BUN: 23 mg/dL (ref 8–27)
CO2: 24 mmol/L (ref 20–29)
Calcium: 9.7 mg/dL (ref 8.7–10.3)
Chloride: 109 mmol/L — ABNORMAL HIGH (ref 96–106)
Creatinine, Ser: 1.19 mg/dL — ABNORMAL HIGH (ref 0.57–1.00)
Glucose: 107 mg/dL — ABNORMAL HIGH (ref 70–99)
Potassium: 5 mmol/L (ref 3.5–5.2)
Sodium: 147 mmol/L — ABNORMAL HIGH (ref 134–144)
eGFR: 45 mL/min/{1.73_m2} — ABNORMAL LOW (ref >59)

## 2021-12-08 LAB — SEDIMENTATION RATE: Sed Rate: 3 mm/hr (ref 0–40)

## 2021-12-08 NOTE — Telephone Encounter (Addendum)
Results called to patient who verbalizes understanding!  ? ? ? ?----- Message from Marylu Lund., NP sent at 12/08/2021  9:34 AM EDT ----- ?Good morning, ? ?Please let Ms. Veltre know that her inflammatory markers were normal and that her renal function is stable since last tested. She should continue hydration and use Tylenol, and heat pack to area as needed for any pain and discomfort. We will continue with cardiac CTA as planned.  Let us know if you have any questions. ? ?Thank you, ? ?Ambrose Pancoast, NP ?

## 2021-12-17 ENCOUNTER — Other Ambulatory Visit (HOSPITAL_COMMUNITY): Payer: Self-pay | Admitting: *Deleted

## 2021-12-17 ENCOUNTER — Telehealth (HOSPITAL_COMMUNITY): Payer: Self-pay | Admitting: *Deleted

## 2021-12-17 DIAGNOSIS — I7121 Aneurysm of the ascending aorta, without rupture: Secondary | ICD-10-CM

## 2021-12-17 NOTE — Telephone Encounter (Signed)
Attempted to call patient regarding upcoming cardiac CT appointment. °Left message on voicemail with name and callback number ° °Elhadj Girton RN Navigator Cardiac Imaging °[facility] °[phone] Office °[phone] Cell ° °

## 2021-12-17 NOTE — Progress Notes (Signed)
Adding a CT chest aorta angio to CCTA order to adequately obtain images to evaluate ascending aorta. ? ?Gordy Clement RN Navigator Cardiac Imaging ?[facility] ?[phone] Office ?[phone] Cell ? ?

## 2021-12-21 ENCOUNTER — Ambulatory Visit (HOSPITAL_COMMUNITY)
Admission: RE | Admit: 2021-12-21 | Discharge: 2021-12-21 | Disposition: A | Discharge status: Home / Self Care | Payer: Medicare Other | Source: Ambulatory Visit | Attending: Nurse Practitioner | Admitting: Nurse Practitioner

## 2021-12-21 ENCOUNTER — Other Ambulatory Visit: Payer: Self-pay | Admitting: Internal Medicine

## 2021-12-21 ENCOUNTER — Encounter (HOSPITAL_COMMUNITY): Payer: Self-pay

## 2021-12-21 ENCOUNTER — Telehealth (HOSPITAL_BASED_OUTPATIENT_CLINIC_OR_DEPARTMENT_OTHER): Payer: Self-pay

## 2021-12-21 DIAGNOSIS — I3139 Other pericardial effusion (noninflammatory): Secondary | ICD-10-CM | POA: Diagnosis not present

## 2021-12-21 DIAGNOSIS — R072 Precordial pain: Secondary | ICD-10-CM | POA: Diagnosis not present

## 2021-12-21 DIAGNOSIS — I712 Thoracic aortic aneurysm, without rupture, unspecified: Secondary | ICD-10-CM | POA: Diagnosis not present

## 2021-12-21 DIAGNOSIS — I7121 Aneurysm of the ascending aorta, without rupture: Secondary | ICD-10-CM | POA: Diagnosis not present

## 2021-12-21 DIAGNOSIS — M47814 Spondylosis without myelopathy or radiculopathy, thoracic region: Secondary | ICD-10-CM | POA: Diagnosis not present

## 2021-12-21 DIAGNOSIS — I281 Aneurysm of pulmonary artery: Secondary | ICD-10-CM | POA: Diagnosis not present

## 2021-12-21 MED ORDER — NITROGLYCERIN 0.4 MG SL SUBL
SUBLINGUAL_TABLET | SUBLINGUAL | Status: AC
  Filled 2021-12-21: qty 2

## 2021-12-21 MED ORDER — NITROGLYCERIN 0.4 MG SL SUBL
0.8000 mg | SUBLINGUAL_TABLET | Freq: Once | SUBLINGUAL | Status: AC
Start: 2021-12-21 — End: 2021-12-21
  Administered 2021-12-21: 0.8 mg via SUBLINGUAL

## 2021-12-21 MED ORDER — IOHEXOL 350 MG/ML SOLN
100.0000 mL | Freq: Once | INTRAVENOUS | Status: AC | PRN
  Administered 2021-12-21: 100 mL via INTRAVENOUS

## 2021-12-21 NOTE — Telephone Encounter (Addendum)
Called results to patient and left results on VM (ok per DPR), instructions left to call office back if patient has any questions!  ? ? ? ?----- Message from Marylu Lund., NP sent at 12/21/2021  1:55 PM EDT ----- ?Please let Ms. Rampersaud know that the results of her coronary CTA were all within normal limits.  Her calcium score is a 0 and no significant plaque found in any coronary arteries.  Please continue amlodipine 5 mg twice daily and nitroglycerin 0.4 mg as needed.  Continue warm compress and as needed Tylenol for pain as well. ? ?Thank you ?Ambrose Pancoast, NP ?

## 2022-01-04 ENCOUNTER — Other Ambulatory Visit: Payer: Self-pay | Admitting: Internal Medicine

## 2022-01-06 ENCOUNTER — Ambulatory Visit (INDEPENDENT_AMBULATORY_CARE_PROVIDER_SITE_OTHER): Payer: Medicare Other | Admitting: Family Medicine

## 2022-01-06 ENCOUNTER — Encounter: Payer: Self-pay | Admitting: Family Medicine

## 2022-01-06 VITALS — BP 140/80 | HR 60 | Temp 97.5°F | Ht 66.0 in | Wt 128.0 lb

## 2022-01-06 DIAGNOSIS — R0789 Other chest pain: Secondary | ICD-10-CM | POA: Diagnosis not present

## 2022-01-06 DIAGNOSIS — R079 Chest pain, unspecified: Secondary | ICD-10-CM

## 2022-01-06 NOTE — Progress Notes (Signed)
? ?Subjective:  ? ? ? Patient ID: Monique [name], female    DOB: 09/30/1935, 86 y.o.   MRN: [id number] ? ?Chief Complaint  ?Patient presents with  ? Chest Pain  ?  Started last night. Pt states she had an aneurism in her chest last year.   ? ? ?HPI ?Patient is in today for chest pain. Pain was sharp and started yesterday at 3pm and then it went away after she put a lidocaine patch on her chest. Chest pain returned last night around 7 pm while she was resting and it did not resolve until this morning an hour after taking a nitroglycerin.  ?Denies pain currently. Feels at baseline now.  ? ?No palpitations or shortness of breath.  ? ?States she was burping a lot yesterday and took her usual acid reflux medication.  ? ?Recent cardiovascular work up for the same pain per patient. No new symptoms.  ? ? ? ?Health Maintenance Due  ?Topic Date Due  ? OPHTHALMOLOGY EXAM  11/22/2018  ? COVID-19 Vaccine (4 - Booster for Pfizer series) 10/22/2020  ? ? ?Past Medical History:  ?Diagnosis Date  ? Abdominal pain 11/18/2015  ? Acute respiratory failure with hypoxia ([place]) 06/27/2014  ? Breast pain, left 10/11/2016  ? Cerebral aneurysm 2000 06/27/2014  ? Chest pain 10/30/2014  ? CHF (congestive heart failure) ([place])   ? Chronic anticoagulation 06/2014  ? Coumadin started after bilateral PE  ? Chronic kidney disease, stage III (moderate) ([place]) 11/21/2014  ? Clotting disorder ([place])   ? right was removed, still have left cataract  ? Diabetes ([place])   ? Dizziness and giddiness 11/28/2014  ? DVT of lower extremity (deep venous thrombosis) ([place]) 11/28/2014  ? Dysuria 07/07/2014  ? Elevated troponin 11/28/2014  ? Fatty liver   ? Gastritis 05/16/2012  ? Chronic dyspepsia on PPI daily.   ? Headache, acute 06/08/2010  ? Qualifier: Diagnosis of  By: Linda Hedges MD, Heinz Knuckles   ? Heart murmur   ? Hematochezia 11/18/2015  ? History of fibromuscular dysplasia 06/27/2014  ? HTN (hypertension)   ? Essential  ? Hyperlipidemia   ? Hypertrophic obstructive cardiomyopathy  (HOCM) ([place]) 05/20/2008  ? CMR 4/22: EF 72, small amount of patchy LGE in basal septum c/w HCM, asymmetric basal septal hypertrophy (15 mm) c/w HCM, no ischemia, ascending aorta 38 mm  ? Kidney stones   ? LVH (left ventricular hypertrophy)   ? Orthostatic hypotension 11/28/2014  ? PAF (paroxysmal atrial fibrillation) ([place]) 2000  ? Palpitations 11/28/2014  ? PAROXYSMAL ATRIAL FIBRILLATION 05/20/2008  ? Qualifier: Diagnosis of  By: Linda Hedges MD, Heinz Knuckles   ? PE (pulmonary embolism) 06/2014  ? Bilateral  ? Postmenopausal osteoporosis 04/2019  ? T score -3.3  ? Pulmonary embolism, bilateral ([place]) 06/27/2014  ? SOB (shortness of breath) 06/27/2014  ? Subarachnoid hemorrhage ([place]) 2000  ? UTI (urinary tract infection) 01/04/2015  ? ? ?Past Surgical History:  ?Procedure Laterality Date  ? ABDOMINAL HYSTERECTOMY  2000  ? Brain hemorrage    ? 2000  ? CARDIAC CATHETERIZATION  05/2008  ? Nl cors, no gradient, EF 60%  ? ? ?Family History  ?Problem Relation Age of Onset  ? Breast cancer Mother 34  ?     Deceased  ? Stroke Mother   ? Arrhythmia Mother   ? Heart disease Mother   ? Colon cancer Sister   ? Cervical cancer Sister   ? Arrhythmia Sister   ? Breast cancer Daughter 30  ?  Esophageal cancer Neg Hx   ? Pancreatic cancer Neg Hx   ? Rectal cancer Neg Hx   ? Stomach cancer Neg Hx   ? ? ?Social History  ? ?Socioeconomic History  ? Marital status: Divorced  ?  Spouse name: Not on file  ? Number of children: 4  ? Years of education: 1  ? Highest education level: Not on file  ?Occupational History  ? Occupation: Retired [employer]  ?Tobacco Use  ? Smoking status: Former  ?  Types: Cigarettes  ?  Quit date: 09/06/1967  ?  Years since quitting: 54.3  ? Smokeless tobacco: Never  ?Vaping Use  ? Vaping Use: Never used  ?Substance and Sexual Activity  ? Alcohol use: No  ?  Alcohol/week: 0.0 standard drinks  ? Drug use: No  ? Sexual activity: Not Currently  ?  Comment: 1st intercourse 86yo-Fewer than 5 partners  ?Other Topics Concern  ?  Not on file  ?Social History Narrative  ? HSG. Married '61 - '88/divorced. 3 sons - '74, '59, '62, 1 daughter '63; 5 grandchildren. 10/06/20 Lives alone in two story home.   ? She does not drive.  ? Retired from [employer].  ? ?Social Determinants of Health  ? ?Financial Resource Strain: Low Risk   ? Difficulty of Paying Living Expenses: Not hard at all  ?Food Insecurity: No Food Insecurity  ? Worried About Charity fundraiser in the Last Year: Never true  ? Ran Out of Food in the Last Year: Never true  ?Transportation Needs: No Transportation Needs  ? Lack of Transportation (Medical): No  ? Lack of Transportation (Non-Medical): No  ?Physical Activity: Sufficiently Active  ? Days of Exercise per Week: 5 days  ? Minutes of Exercise per Session: 30 min  ?Stress: No Stress Concern Present  ? Feeling of Stress : Not at all  ?Social Connections: Moderately Integrated  ? Frequency of Communication with Friends and Family: More than three times a week  ? Frequency of Social Gatherings with Friends and Family: More than three times a week  ? Attends Religious Services: More than 4 times per year  ? Active Member of Clubs or Organizations: Yes  ? Attends Archivist Meetings: More than 4 times per year  ? Marital Status: Divorced  ?Intimate Partner Violence: Not At Risk  ? Fear of Current or Ex-Partner: No  ? Emotionally Abused: No  ? Physically Abused: No  ? Sexually Abused: No  ? ? ?Outpatient Medications Prior to Visit  ?Medication Sig Dispense Refill  ? acetaminophen (TYLENOL) 500 MG tablet Take 1,000 mg by mouth every 6 (six) hours as needed for moderate pain or headache.    ? amLODipine (NORVASC) 5 MG tablet TAKE 1 TABLET BY MOUTH TWICE A DAY 180 tablet 3  ? atorvastatin (LIPITOR) 20 MG tablet TAKE 1 TABLET BY MOUTH DAILY AT 6 PM. 90 tablet 1  ? disopyramide (NORPACE) 150 MG capsule TAKE 1 CAPSULE BY MOUTH TWICE A DAY 180 capsule 1  ? lidocaine (LIDODERM) 5 % Place 1 patch onto the skin daily.  30 patch 11  ? metFORMIN (GLUCOPHAGE) 500 MG tablet TAKE 1 TABLET BY MOUTH EVERY DAY WITH BREAKFAST 90 tablet 1  ? metoprolol tartrate (LOPRESSOR) 50 MG tablet Take 1 tablet (50 mg total) by mouth 2 (two) times daily. 180 tablet 3  ? mirtazapine (REMERON) 15 MG tablet TAKE 1 TABLET BY MOUTH EVERYDAY AT BEDTIME 90 tablet 1  ? nitroGLYCERIN (  NITROSTAT) 0.4 MG SL tablet Place 1 tablet (0.4 mg total) under the tongue every 5 (five) minutes as needed for chest pain. 75 tablet 1  ? pantoprazole (PROTONIX) 40 MG tablet TAKE 1 TABLET BY MOUTH EVERY DAY 90 tablet 1  ? trospium (SANCTURA) 20 MG tablet Take 1 tablet (20 mg total) by mouth 2 (two) times daily. 60 tablet 5  ? XARELTO 15 MG TABS tablet TAKE 1 TABLET BY MOUTH EVERY DAY WITH SUPPER 30 tablet 5  ? ?No facility-administered medications prior to visit.  ? ? ?No Known Allergies ? ?ROS ?Pertinent positives and negatives in the history of present illness. ? ?   ?Objective:  ?  ?Physical Exam ?Constitutional:   ?   General: She is not in acute distress. ?   Appearance: She is not ill-appearing or diaphoretic.  ?Eyes:  ?   Extraocular Movements: Extraocular movements intact.  ?   Pupils: Pupils are equal, round, and reactive to light.  ?Cardiovascular:  ?   Rate and Rhythm: Normal rate and regular rhythm.  ?   Heart sounds: Normal heart sounds.  ?Pulmonary:  ?   Effort: Pulmonary effort is normal.  ?   Breath sounds: Normal breath sounds.  ?Chest:  ?   Chest wall: Tenderness present.  ? ? ?   Comments: Focal area of chest wall tenderness to palpation  ?Abdominal:  ?   Palpations: Abdomen is soft.  ?   Tenderness: There is no abdominal tenderness. There is no guarding.  ?Musculoskeletal:     ?   General: Normal range of motion.  ?   Cervical back: Normal range of motion and neck supple.  ?Skin: ?   General: Skin is warm and dry.  ?Neurological:  ?   General: No focal deficit present.  ?   Mental Status: She is alert.  ?Psychiatric:     ?   Mood and Affect: Mood normal.      ?   Behavior: Behavior normal.  ? ? ?BP 140/80 (BP Location: Left Arm, Patient Position: Sitting, Cuff Size: Large)   Pulse 60   Temp (!) 97.5 ?F (36.4 ?C) (Temporal)   Ht '5\' 6"'$  (1.676 m)   Wt 128 lb (

## 2022-01-06 NOTE — Patient Instructions (Signed)
Thank you for coming in today. ? ?Your chest pain does not appear to be related to your heart at this time. ? ?I recommend taking it easy and avoiding any strenuous upper body activity since your chest wall is tender. ? ?Take Tylenol and use heating pad as recommended by your cardiologist. ? ?If you develop any severe chest pain that does not resolve with rest, Tylenol and heat or if you develop shortness of breath or any new concerning symptoms then I recommend calling 911 or going to the emergency department. ?

## 2022-01-25 ENCOUNTER — Ambulatory Visit (INDEPENDENT_AMBULATORY_CARE_PROVIDER_SITE_OTHER): Payer: Medicare Other

## 2022-01-25 DIAGNOSIS — E1169 Type 2 diabetes mellitus with other specified complication: Secondary | ICD-10-CM

## 2022-01-25 DIAGNOSIS — N1832 Chronic kidney disease, stage 3b: Secondary | ICD-10-CM

## 2022-01-25 DIAGNOSIS — E119 Type 2 diabetes mellitus without complications: Secondary | ICD-10-CM

## 2022-01-25 DIAGNOSIS — I480 Paroxysmal atrial fibrillation: Secondary | ICD-10-CM

## 2022-01-25 DIAGNOSIS — I10 Essential (primary) hypertension: Secondary | ICD-10-CM

## 2022-01-25 NOTE — Patient Instructions (Signed)
Visit Information  Following are the goals we discussed today:   Manage My Medicine   Timeframe:  Long-Range Goal Priority:  Medium Start Date:    01/25/2022                         Expected End Date:  01/26/2023                     Follow Up Date 01/2023   - call for medicine refill 2 or 3 days before it runs out - call if I am sick and can't take my medicine - keep a list of all the medicines I take; vitamins and herbals too - learn to read medicine labels    Why is this important?   These steps will help you keep on track with your medicines.  Plan: Telephone follow up appointment with care management team member scheduled for:  1 year  The patient has been provided with contact information for the care management team and has been advised to call with any health related questions or concerns.   Tomasa Blase, PharmD Clinical Pharmacist, [facility]   Please call the care guide team at [phone] if you need to cancel or reschedule your appointment.   The patient verbalized understanding of instructions, educational materials, and care plan provided today and DECLINED offer to receive copy of patient instructions, educational materials, and care plan.

## 2022-01-25 NOTE — Progress Notes (Signed)
Chronic Care Management Pharmacy Note  01/25/2022 Name:  Monique [name] MRN:  [id number] DOB:  1935-10-21  Summary: -Pt endorses compliance to current medications, denies any issues with her medications at this time  -Monitoring BP at home, typically averaging 120-130/80 -Pt denies any issues or concerns at this time   Recommendations/Changes made from today's visit: -Recommending to continue current medications -Pt to continue to monitor BP and HR at home, to make office aware should current control be lost   Plan: -F/u in 1 year   Subjective: Monique [name] is an 86 y.o. year old female who is a primary patient of Hoyt Koch, MD.  The CCM team was consulted for assistance with disease management and care coordination needs.    Engaged with patient by telephone for follow up visit in response to provider referral for pharmacy case management and/or care coordination services.   Consent to Services:  The patient was given the following information about Chronic Care Management services today, agreed to services, and gave verbal consent: 1. CCM service includes personalized support from designated clinical staff supervised by the primary care provider, including individualized plan of care and coordination with other care providers 2. 24/7 contact phone numbers for assistance for urgent and routine care needs. 3. Service will only be billed when office clinical staff spend 20 minutes or more in a month to coordinate care. 4. Only one practitioner may furnish and bill the service in a calendar month. 5.The patient may stop CCM services at any time (effective at the end of the month) by phone call to the office staff. 6. The patient will be responsible for cost sharing (co-pay) of up to 20% of the service fee (after annual deductible is met). Patient agreed to services and consent obtained.  Patient Care Team: Hoyt Koch, MD as PCP - General (Internal  Medicine) Fay Records, MD as PCP - Cardiology (Cardiology) Fay Records, MD (Cardiology) Charlton Haws, [facility] as Pharmacist (Pharmacist) Monna Fam, MD as Consulting Physician (Ophthalmology)  Recent office visits: 01/06/2022 - Harland Dingwall PA-C - chest pains - not thought to be related to heart at this time - continue APAP and heat as needed  11/04/2021 - Dr. Sharlet Salina - Chest tightness  - EKG stable - follow up with cardiology regarding chest tightness (nitro taken for chest tightness - did not relieve - less likely tightness is cardiac in origin)  Recent consult visits: 12/07/2021 - Ambrose Pancoast NP - Cardiology - complaint for tightness of chest - cardiac CTA ordered - nitroglycerin prn, warm compress, APAP prn - f/u in 2 months  11/10/2021 - Dr. Harrington Challenger - Cardiology - chest pains - not believed to be cardiac in origin, continue current medications   Hospital visits: None in previous 6 months  Objective:  Lab Results  Component Value Date   CREATININE 1.19 (H) 12/07/2021   BUN 23 12/07/2021   GFR 36.97 (L) 11/04/2021   GFRNONAA 43 (L) 08/22/2019   GFRAA 50 (L) 08/22/2019   NA 147 (H) 12/07/2021   K 5.0 12/07/2021   CALCIUM 9.7 12/07/2021   CO2 24 12/07/2021   GLUCOSE 107 (H) 12/07/2021    Lab Results  Component Value Date/Time   HGBA1C 7.1 (H) 11/04/2021 09:15 AM   HGBA1C 7.0 (H) 06/28/2021 10:22 AM   GFR 36.97 (L) 11/04/2021 09:15 AM   GFR 41.51 (L) 06/28/2021 10:22 AM   MICROALBUR 1.7 11/04/2021 09:15 AM   MICROALBUR 80 02/12/2019 02:30  PM    Last diabetic Eye exam:  Lab Results  Component Value Date/Time   HMDIABEYEEXA No Retinopathy 11/21/2017 12:00 AM    Last diabetic Foot exam: No results found for: HMDIABFOOTEX   Lab Results  Component Value Date   CHOL 125 11/04/2021   HDL 60.10 11/04/2021   LDLCALC 53 11/04/2021   TRIG 60.0 11/04/2021   CHOLHDL 2 11/04/2021       Latest Ref Rng & Units 11/04/2021    9:15 AM 06/28/2021   10:22 AM 03/19/2021     9:40 AM  Hepatic Function  Total Protein 6.0 - 8.3 g/dL 6.9   6.7   7.0    Albumin 3.5 - 5.2 g/dL 4.0   3.9   4.1    AST 0 - 37 U/L 20   22   20     ALT 0 - 35 U/L 10   10   10     Alk Phosphatase 39 - 117 U/L 88   83   90    Total Bilirubin 0.2 - 1.2 mg/dL 0.5   0.5   0.5      Lab Results  Component Value Date/Time   TSH 2.660 04/29/2019 04:54 PM   TSH 2.829 09/08/2017 12:23 AM   TSH 0.91 11/20/2014 05:18 PM   FREET4 0.97 11/20/2014 05:18 PM       Latest Ref Rng & Units 11/04/2021    9:15 AM 03/19/2021    9:40 AM 10/26/2020    2:57 PM  CBC  WBC 4.0 - 10.5 K/uL 7.4   7.0   9.2    Hemoglobin 12.0 - 15.0 g/dL 13.0   12.5   13.1    Hematocrit 36.0 - 46.0 % 39.3   37.8   39.4    Platelets 150.0 - 400.0 K/uL 188.0   179.0   194.0      No results found for: VD25OH  Clinical ASCVD: No  The ASCVD Risk score (Arnett DK, et al., 2019) failed to calculate for the following reasons:   The 2019 ASCVD risk score is only valid for ages 52 to 47       01/06/2022   10:46 AM 04/28/2021    4:16 PM 04/17/2020    2:19 PM  Depression screen PHQ 2/9  Decreased Interest 0 0 0  Down, Depressed, Hopeless 0 0 0  PHQ - 2 Score 0 0 0    CHA2DS2-VASc Score = 4  The patient's score is based upon: CHF History: 0 HTN History: 1 Diabetes History: 0 Stroke History: 0 Vascular Disease History: 0 Age Score: 2 Gender Score: 1     Social History   Tobacco Use  Smoking Status Former   Types: Cigarettes   Quit date: 09/06/1967   Years since quitting: 54.4  Smokeless Tobacco Never   BP Readings from Last 3 Encounters:  01/06/22 140/80  12/21/21 133/66  12/07/21 132/80   Pulse Readings from Last 3 Encounters:  01/06/22 60  12/21/21 66  12/07/21 (!) 56   Wt Readings from Last 3 Encounters:  01/06/22 128 lb (58.1 kg)  12/07/21 131 lb (59.4 kg)  11/10/21 132 lb (59.9 kg)   BMI Readings from Last 3 Encounters:  01/06/22 20.66 kg/m  12/07/21 21.14 kg/m  11/10/21 21.31 kg/m     Assessment/Interventions: Review of patient past medical history, allergies, medications, health status, including review of consultants reports, laboratory and other test data, was performed as part of comprehensive evaluation and provision of chronic  care management services.   SDOH:  (Social Determinants of Health) assessments and interventions performed: Yes  SDOH Screenings   Alcohol Screen: Low Risk    Last Alcohol Screening Score (AUDIT): 0  Depression (PHQ2-9): Low Risk    PHQ-2 Score: 0  Financial Resource Strain: Low Risk    Difficulty of Paying Living Expenses: Not hard at all  Food Insecurity: No Food Insecurity   Worried About Charity fundraiser in the Last Year: Never true   Ran Out of Food in the Last Year: Never true  Housing: Low Risk    Last Housing Risk Score: 0  Physical Activity: Sufficiently Active   Days of Exercise per Week: 5 days   Minutes of Exercise per Session: 30 min  Social Connections: Moderately Integrated   Frequency of Communication with Friends and Family: More than three times a week   Frequency of Social Gatherings with Friends and Family: More than three times a week   Attends Religious Services: More than 4 times per year   Active Member of [employer] or Organizations: Yes   Attends Music therapist: More than 4 times per year   Marital Status: Divorced  Stress: No Stress Concern Present   Feeling of Stress : Not at all  Tobacco Use: Medium Risk   Smoking Tobacco Use: Former   Smokeless Tobacco Use: Never   Passive Exposure: Not on [employer] consultant Needs: No Transportation Needs   Lack of Transportation (Medical): No   Lack of Transportation (Non-Medical): No    CCM Care Plan  No Known Allergies  Medications Reviewed Today     Reviewed by Tomasa Blase, [facility] (Pharmacist) on 01/25/22 at 1326  Med List Status: <None>   Medication Order Taking? Sig Documenting Provider Last Dose Status Informant  acetaminophen  (TYLENOL) 500 MG tablet [id number] Yes Take 1,000 mg by mouth every 6 (six) hours as needed for moderate pain or headache. [provider] Taking Active Self           Med Note Allene Dillon Nov 18, 2020  2:24 PM)    amLODipine (NORVASC) 5 MG tablet [id number] Yes TAKE 1 TABLET BY MOUTH TWICE A DAY Fay Records, MD Taking Active   atorvastatin (LIPITOR) 20 MG tablet [id number] Yes TAKE 1 TABLET BY MOUTH DAILY AT 6 PM. Hoyt Koch, MD Taking Active   disopyramide (NORPACE) 150 MG capsule [id number] Yes TAKE 1 CAPSULE BY MOUTH TWICE A DAY Fay Records, MD Taking Active   lidocaine (LIDODERM) 5 % [id number] Yes Place 1 patch onto the skin daily. Hoyt Koch, MD Taking Active   metFORMIN (GLUCOPHAGE) 500 MG tablet [id number] Yes TAKE 1 TABLET BY MOUTH EVERY DAY WITH BREAKFAST Hoyt Koch, MD Taking Active   metoprolol tartrate (LOPRESSOR) 50 MG tablet [id number] Yes Take 1 tablet (50 mg total) by mouth 2 (two) times daily. Fay Records, MD Taking Active     Discontinued 08/14/20 0929   mirtazapine (REMERON) 15 MG tablet [id number] Yes TAKE 1 TABLET BY MOUTH EVERYDAY AT BEDTIME Hoyt Koch, MD Taking Active   nitroGLYCERIN (NITROSTAT) 0.4 MG SL tablet [id number] Yes Place 1 tablet (0.4 mg total) under the tongue every 5 (five) minutes as needed for chest pain. Fay Records, MD Taking Active   pantoprazole (PROTONIX) 40 MG tablet [id number] Yes TAKE 1 TABLET BY MOUTH EVERY DAY Hoyt Koch, MD Taking Active   trospium (SANCTURA) 20 MG tablet  [id number] No Take 1 tablet (20 mg total) by mouth 2 (two) times daily.  Patient not taking: Reported on 01/25/2022   Jaquita Folds, MD Not Taking Active   XARELTO 15 MG TABS tablet [id number] Yes TAKE 1 TABLET BY MOUTH EVERY DAY WITH SUPPER Liliane Shi, [place] Taking Active             Patient Active Problem List   Diagnosis Date Noted   Left arm pain 11/04/2021   Osteoporosis 03/19/2021    Muscle spasm 01/02/2020   Thoracic aortic aneurysm without rupture ([place]) 12/11/2019   PSVT (paroxysmal supraventricular tachycardia) ([place]) 08/22/2019   Chest pain 08/21/2019   Blood in stool 01/04/2018   Diabetes mellitus type 2 with complications ([place]) 44/81/8563   Allergic rhinitis 06/20/2017   Headache 10/23/2015   Frequent urination 05/06/2015   Chronic RLQ pain 04/24/2015   Chest tightness 11/28/2014   Chronic kidney disease, stage III (moderate) ([place]) 11/21/2014   Pulmonary embolism, bilateral ([place]) 06/27/2014   Cerebral aneurysm 2000 06/27/2014   Hyperlipidemia associated with type 2 diabetes mellitus ([place]) 05/20/2008   Essential hypertension 05/20/2008   Hypertrophic obstructive cardiomyopathy (HOCM) ([place]) 05/20/2008   PAROXYSMAL ATRIAL FIBRILLATION 05/20/2008   Subarachnoid hemorrhage 2000 05/20/2008    Immunization History  Administered Date(s) Administered   Fluad Quad(high Dose 65+) 07/16/2019, 06/19/2020, 06/28/2021   Influenza Split 05/15/2012   Influenza Whole 09/03/2009   Influenza, High Dose Seasonal PF 10/11/2016, 06/20/2017, 07/16/2018   Influenza,inj,Quad PF,6+ Mos 06/28/2014, 09/17/2015   PFIZER(Purple Top)SARS-COV-2 Vaccination 11/10/2019, 12/09/2019, 08/27/2020   Pneumococcal Conjugate-13 04/24/2015   Pneumococcal Polysaccharide-23 05/15/2012   Td 11/08/2002   Tdap 06/20/2017   Zoster, Live 04/09/2013    Conditions to be addressed/monitored:  Hypertension, Hyperlipidemia, Diabetes, Atrial Fibrillation, GERD, and Headaches  Care Plan : [place]  Updates made by Tomasa Blase, RPH since 01/25/2022 12:00 AM     Problem: Hypertension, Hyperlipidemia, Diabetes, Atrial Fibrillation, GERD, and Headaches   Priority: High     Long-Range Goal: Disease management   Start Date: 12/04/2020  Expected End Date: 01/26/2023  This Visit's Progress: On track  Recent Progress: On track  Priority: High  Note:   Current Barriers:  Unable to  independently monitor therapeutic efficacy  Pharmacist Clinical Goal(s):  Patient will achieve adherence to monitoring guidelines and medication adherence to achieve therapeutic efficacy through collaboration with PharmD and provider.   Interventions: 1:1 collaboration with Hoyt Koch, MD regarding development and update of comprehensive plan of care as evidenced by provider attestation and co-signature Inter-disciplinary care team collaboration (see longitudinal plan of care) Comprehensive medication review performed; medication list updated in electronic medical record   Hypertension    BP goal is:  <130/80 Patient checks BP at home daily Patient home BP readings are ranging: 120-130/80's BP Readings from Last 3 Encounters:  01/06/22 140/80  12/21/21 133/66  12/07/21 132/80    Patient has failed these meds in the past: n/a Patient is currently controlled on the following medications:  Amlodipine 5 mg BID Metoprolol tartrate 50 mg BID   We discussed: BP controlled and HR on low side of normal; counseled on importance of home monitoring   Plan Continue current medications  Continue to monitor BP/HR at home   AFIB    Patient is currently rate controlled.  Patient has failed these meds in past: n/a Patient is currently controlled on the following medications:  Metoprolol tartrate 50 mg BID Disopyramide 150 mg BID Xarelto 15  mg daily PM   We discussed: importance of compliance with Xarelto - current dose appropriate based on latest kidney function    Plan: Continue current medications   Hyperlipidemia    LDL goal < 70 Lab Results  Component Value Date   LDLCALC 53 11/04/2021  Cath 2009 no CAD. Myoview 2020 low risk.  Patient has failed these meds in past: n/a Patient is currently controlled on the following medications:  Atorvastatin 20 mg daily HS Nitroglycerin 0.4 mg SL prn   We discussed: cholesterol is at goal; pt has cardiac MRI this month to  evaluate recent chest pain and cardiac risk   Plan: Continue current medications   Diabetes    A1c goal <7% Lab Results  Component Value Date   HGBA1C 7.1 (H) 11/04/2021  Checking BG: Never   Patient has failed these meds in past: n/a Patient is currently controlled on the following medications: Metformin 500 mg daily AM   We discussed: A1c goal; benefits of metformin   Plan: Continue current medications, consider intensifying therapy should A1c remain elevated with next check    GERD    Patient has failed these meds in past: lansoprazole Patient is currently controlled on the following medications:  Pantoprazole 40 mg HS    Plan: Continue current medications    Headache    Patient has failed these meds in past: sumatriptan Patient is currently uncontrolled on the following medications:  Tylenol 500 mg PRN   We discussed:  Limiting APAP use to no more than 3075m daily - pt using at most once daily at this time   Plan Continue current medications   Patient Goals/Self-Care Activities Patient will:  - take medications as prescribed focus on medication adherence by pill box check blood pressure daily, document, and provide at future appointments   Follow Up Plan: Telephone follow up appointment with care management team member scheduled for: 1 year       Medication Assistance: None required.  Patient affirms current coverage meets needs.  Patient's preferred pharmacy is:  [id number] [address] [place]: [phone]: [address] [id number]: [phone]: [phone] pill box? Yes Pt endorses 100% compliance  Care Plan and Follow Up Patient Decision:  Patient agrees to Care Plan and Follow-up.  Plan: Telephone follow up appointment with care  management team member scheduled for:  1 year  DaTomasa BlasePharmD Clinical Pharmacist, [place]

## 2022-02-02 DIAGNOSIS — Z87891 Personal history of nicotine dependence: Secondary | ICD-10-CM

## 2022-02-02 DIAGNOSIS — Z7984 Long term (current) use of oral hypoglycemic drugs: Secondary | ICD-10-CM | POA: Diagnosis not present

## 2022-02-02 DIAGNOSIS — I4891 Unspecified atrial fibrillation: Secondary | ICD-10-CM | POA: Diagnosis not present

## 2022-02-02 DIAGNOSIS — I10 Essential (primary) hypertension: Secondary | ICD-10-CM

## 2022-02-02 DIAGNOSIS — E785 Hyperlipidemia, unspecified: Secondary | ICD-10-CM

## 2022-02-02 DIAGNOSIS — E1169 Type 2 diabetes mellitus with other specified complication: Secondary | ICD-10-CM | POA: Diagnosis not present

## 2022-02-04 ENCOUNTER — Other Ambulatory Visit: Payer: Self-pay | Admitting: Physician Assistant

## 2022-02-04 NOTE — Telephone Encounter (Signed)
Prescription refill request for Xarelto received.  Indication: PAF Last office visit:  12/07/21  Elvin So NP Weight: 59.4kg Age: 86 Scr: 1.19 on 12/07/21 CrCl: 33.59  Based on above findings Xarelto '15mg'$  once daily is the appropriate dose.  Refill approved.

## 2022-02-13 ENCOUNTER — Other Ambulatory Visit: Payer: Self-pay | Admitting: Internal Medicine

## 2022-03-02 ENCOUNTER — Ambulatory Visit: Payer: Medicare Other | Admitting: Internal Medicine

## 2022-03-02 ENCOUNTER — Encounter: Payer: Self-pay | Admitting: Internal Medicine

## 2022-03-02 VITALS — BP 144/80 | HR 58 | Wt 126.0 lb

## 2022-03-02 DIAGNOSIS — I480 Paroxysmal atrial fibrillation: Secondary | ICD-10-CM

## 2022-03-02 MED ORDER — ATORVASTATIN CALCIUM 10 MG PO TABS: 10.0000 mg | ORAL_TABLET | Freq: Every day | ORAL | 3 refills | Status: DC

## 2022-03-02 NOTE — Progress Notes (Unsigned)
Cardiology Office Note    Date:  03/02/2022   ID:  Monique, [name] 23-Jul-1936, MRN [id number]  PCP:  Hoyt Koch, MD  Cardiologist: Dr. Harrington Challenger  Chief Complaint: Follow up for HOCM & Afib  History of Present Illness:   Monique [name] is a 86 y.o. female with hx of HOCM, HTN, PAF, bilateral PE, DVT, CKD stage III and HLD presents for follow up.  The patient has had many episodes of chest pain in the past.  She has undergone several stress test (nuclear, 2016, 2019).  In April 2022 she had an MRI stress test ordered by Richardson Dopp.  This showed a normal hyperdynamic response to stress.  MRI showed a ventricular septum measuring at 15 mm.  There was less than 1% late gadolinium enhancement noted at the septum base.  LVEF 72%.  In 2020 the patient was admitted with chest pain.  Note she developed SVT during the admission.  She was placed back down to is a provide and metoprolol.  The patient was last in clinic back last spring.  She says about a month ago she started having intermittent chest pains.  Episodes occur with and without activity.  They can come and go throughout the day.  They do not occur every day.  Again there is nothing that makes them better or worse.  No specific time of day that they happen.  IPt still gets CP   Occurs with and without activity Comes and goes     Can go and com  Last month started  Didn't hurt a all yesterda   Diet Breakfast: Eggs, toast with jelly, bacon.\ Lunch: Salad.  No protein.\ Dinner: Carrots, green beans, chicken breast. Drinks: Occasional sweet tea.  Otherwise water. Past Medical History:  Diagnosis Date   Abdominal pain 11/18/2015   Acute respiratory failure with hypoxia ([place]) 06/27/2014   Breast pain, left 10/11/2016   Cerebral aneurysm 2000 06/27/2014   Chest pain 10/30/2014   CHF (congestive heart failure) ([place])    Chronic anticoagulation 06/2014   Coumadin started after bilateral PE   Chronic kidney disease, stage III  (moderate) ([place]) 11/21/2014   Clotting disorder ([place])    right was removed, still have left cataract   Diabetes ([place])    Dizziness and giddiness 11/28/2014   DVT of lower extremity (deep venous thrombosis) ([place]) 11/28/2014   Dysuria 07/07/2014   Elevated troponin 11/28/2014   Fatty liver    Gastritis 05/16/2012   Chronic dyspepsia on PPI daily.    Headache, acute 06/08/2010   Qualifier: Diagnosis of  By: Linda Hedges MD, Heinz Knuckles    Heart murmur    Hematochezia 11/18/2015   History of fibromuscular dysplasia 06/27/2014   HTN (hypertension)    Essential   Hyperlipidemia    Hypertrophic obstructive cardiomyopathy (HOCM) ([place]) 05/20/2008   CMR 4/22: EF 72, small amount of patchy LGE in basal septum c/w HCM, asymmetric basal septal hypertrophy (15 mm) c/w HCM, no ischemia, ascending aorta 38 mm   Kidney stones    LVH (left ventricular hypertrophy)    Orthostatic hypotension 11/28/2014   PAF (paroxysmal atrial fibrillation) ([place]) 2000   Palpitations 11/28/2014   PAROXYSMAL ATRIAL FIBRILLATION 05/20/2008   Qualifier: Diagnosis of  By: Linda Hedges MD, Heinz Knuckles    PE (pulmonary embolism) 06/2014   Bilateral   Postmenopausal osteoporosis 04/2019   T score -3.3   Pulmonary embolism, bilateral ([place]) 06/27/2014   SOB (shortness of breath) 06/27/2014   Subarachnoid  hemorrhage ([place]) 2000   UTI (urinary tract infection) 01/04/2015    Past Surgical History:  Procedure Laterality Date   ABDOMINAL HYSTERECTOMY  2000   Brain hemorrage     2000   CARDIAC CATHETERIZATION  05/2008   Nl cors, no gradient, EF 60%    Current Medications: Prior to Admission medications   Medication Sig Start Date End Date Taking? Authorizing Provider  amLODipine (NORVASC) 5 MG tablet Take 1 tablet (5 mg total) by mouth 2 (two) times daily. 12/02/16   Fay Records, MD  disopyramide (NORPACE) 150 MG capsule Take 1 capsule (150 mg total) by mouth 2 (two) times daily. 01/13/17   Fay Records, MD  metoprolol tartrate (LOPRESSOR) 25  MG tablet Take 1 tablet (25 mg total) by mouth 2 (two) times daily. 12/06/16   Fay Records, MD  mirtazapine (REMERON) 15 MG tablet TAKE 1 TABLET (15 MG TOTAL) BY MOUTH AT BEDTIME. 06/20/17   Hoyt Koch, MD  montelukast (SINGULAIR) 10 MG tablet Take 1 tablet (10 mg total) by mouth at bedtime. 06/20/17   Hoyt Koch, MD  nitroGLYCERIN (NITROSTAT) 0.4 MG SL tablet Place 1 tablet (0.4 mg total) under the tongue every 5 (five) minutes as needed for chest pain. 03/30/15   Fay Records, MD  pantoprazole (PROTONIX) 40 MG tablet Take 1 tablet (40 mg total) by mouth daily. Need annual visit of further refills 06/07/17   Hoyt Koch, MD  Rivaroxaban (XARELTO) 15 MG TABS tablet Take 1 tablet (15 mg total) by mouth daily with supper. 01/17/17   Fay Records, MD  sertraline (ZOLOFT) 50 MG tablet TAKE 1 TABLET (50 MG TOTAL) BY MOUTH DAILY. 02/24/15   Hoyt Koch, MD  simvastatin (ZOCOR) 40 MG tablet TAKE 1 TABLET (40 MG TOTAL) BY MOUTH AT BEDTIME. 01/13/17   Fay Records, MD    Allergies:   Patient has no known allergies.   Social History   Socioeconomic History   Marital status: Divorced    Spouse name: Not on file   Number of children: 4   Years of education: 12   Highest education level: Not on file  Occupational History   Occupation: Retired Doctor, hospital  Tobacco Use   Smoking status: Former    Types: Cigarettes    Quit date: 09/06/1967    Years since quitting: 54.5   Smokeless tobacco: Never  Vaping Use   Vaping Use: Never used  Substance and Sexual Activity   Alcohol use: No    Alcohol/week: 0.0 standard drinks of alcohol   Drug use: No   Sexual activity: Not Currently    Comment: 1st intercourse 86yo-Fewer than 5 partners  Other Topics Concern   Not on file  Social History Narrative   HSG. Married '61 - '88/divorced. 3 sons - '74, '59, '62, 1 daughter '63; 5 grandchildren. 10/06/20 Lives alone in two story home.    She does not drive.   Retired  from [employer].   Social Determinants of Health   Financial Resource Strain: Low Risk  (04/28/2021)   Overall Financial Resource Strain (CARDIA)    Difficulty of Paying Living Expenses: Not hard at all  Food Insecurity: No Food Insecurity (04/28/2021)   Hunger Vital Sign    Worried About Running Out of Food in the Last Year: Never true    Ran Out of Food in the Last Year: Never true  Transportation Needs: No Transportation Needs (04/28/2021)   PRAPARE -  Hydrologist (Medical): No    Lack of Transportation (Non-Medical): No  Physical Activity: Sufficiently Active (04/28/2021)   Exercise Vital Sign    Days of Exercise per Week: 5 days    Minutes of Exercise per Session: 30 min  Stress: No Stress Concern Present (04/28/2021)   [place]    Feeling of Stress : Not at all  Social Connections: Moderately Integrated (04/28/2021)   Social Connection and Isolation Panel [NHANES]    Frequency of Communication with Friends and Family: More than three times a week    Frequency of Social Gatherings with Friends and Family: More than three times a week    Attends Religious Services: More than 4 times per year    Active Member of [employer] or Organizations: Yes    Attends Music therapist: More than 4 times per year    Marital Status: Divorced     Family History:  The patient's family history includes Arrhythmia in her mother and sister; Breast cancer (age of onset: 41) in her daughter; Breast cancer (age of onset: 71) in her mother; Cervical cancer in her sister; Colon cancer in her sister; Heart disease in her mother; Stroke in her mother.   ROS:   Please see the history of present illness.    ROS All other systems reviewed and are negative.   PHYSICAL EXAM:   VS:  BP (!) 144/80 (BP Location: Left Arm, Patient Position: Sitting, Cuff Size: Normal)   Pulse (!) 58   Wt 126  lb (57.2 kg)   SpO2 98%   BMI 20.34 kg/m    GEN: Thin 86 year old in no acute distress  HEENT: normal  Neck: JVP is normal   Cardiac: RRR;No singif murmurs   No LE edema   2+ DP pulses Chest: Slightly tender on L lateral chest area of chest pain is right under her bra line Respiratory:  clear to auscultation bilaterally, normal work of breathing GI: soft, nontender, nondistended, + BS MS: no deformity or atrophy  Skin: warm and dry, no rash Neuro:  Alert and Oriented x 3, Strength and sensation are intact Psych: euthymic mood, full affect  Wt Readings from Last 3 Encounters:  03/02/22 126 lb (57.2 kg)  01/06/22 128 lb (58.1 kg)  12/07/21 131 lb (59.4 kg)      Studies/Labs Reviewed:   EKG:  EKG is ordered today   NSR   60 bpm   LVH   T wave inversion consistent with strain, cannot exlude ishcemia  Recent Labs: 11/04/2021: ALT 10; Hemoglobin 13.0; Platelets 188.0 12/07/2021: BUN 23; Creatinine, Ser 1.19; Potassium 5.0; Sodium 147   Lipid Panel    Component Value Date/Time   CHOL 125 11/04/2021 0915   CHOL 140 11/11/2016 0900   TRIG 60.0 11/04/2021 0915   HDL 60.10 11/04/2021 0915   HDL 51 11/11/2016 0900   CHOLHDL 2 11/04/2021 0915   VLDL 12.0 11/04/2021 0915   LDLCALC 53 11/04/2021 0915   LDLCALC 52 04/17/2020 1451    Additional studies/ records that were reviewed today include:   Echo   Jan 2021  1. Left ventricular ejection fraction, by visual estimation, is 65 to 70%. The left ventricle has normal function. There is mildly increased left ventricular hypertrophy. 2. Elevated left atrial pressure. 3. Left ventricular diastolic parameters are consistent with Grade II diastolic dysfunction (pseudonormalization). 4. The left ventricle has no regional wall motion abnormalities. 5.  Intracavitary gradient up to 5 mmHG. 6. Global right ventricle has normal systolic function.The right ventricular size is normal. No increase in right ventricular wall thickness. 7. Left  atrial size was mildly dilated. 8. The mitral valve is grossly normal. Trivial mitral valve regurgitation. 9. The tricuspid valve is grossly normal. 10. The aortic valve is tricuspid. Aortic valve regurgitation is not visualized. No evidence of aortic valve sclerosis or stenosis. 11. Mildly elevated pulmonary artery systolic pressure. 12. The tricuspid regurgitant velocity is 3.00 m/s, and with an assumed right atrial pressure of 10 mmHg, the estimated right ventricular systolic pressure is mildly elevated at 45.9 mmHg. In comparison to the previous echocardiogram(s): EF remains unchanged on this study. Intracavitary gradient ~5 mmHG on the present study, and noted to be up to 34 mmHG on the prior study. No obvious asymmetric hypertrophy or SAM on this study. Compared to 09/08/2017.  Myovue Dec 2020  The left ventricular ejection fraction is normal (55-65%). Nuclear stress EF: 65%. No T wave inversion was noted during stress. There was no ST segment deviation noted during stress. This is a low risk study.   Normal perfusion. LVEF 65% with normal wall motion. This is a low risk study.   ASSESSMENT & PLAN:   1  Chest pain atypical.  I do not think cardiac in origin.  She is anxious.  I would follow.  Try to loose bra a bit.  2 SVT.  Patient without palpitaitons   Follow   3. HTN BP is fair.  Keep on current regimen \  4. HOCM MRI as noted above  .  Very minor involvement of the myocardium.(<1% LGE)  5   PE  Continue Xarelto    6. HLD  Keep on current regimen   7  DM  Discussed diet  Cut back on carbs    F/U next winter   Medication Adjustments/Labs and Tests Ordered: Current medicines are reviewed at length with the patient today.  Concerns regarding medicines are outlined above.  Medication changes, Labs and Tests ordered today are listed in the Patient Instructions below. There are no Patient Instructions on file for this visit.   Signed, Dorris Carnes, MD  03/02/2022 4:54  PM    [address], [place], [place]  [address] Phone: [phone]; Fax: [phone]

## 2022-03-02 NOTE — Patient Instructions (Signed)
Medication Instructions:  Cut atorvastatin to 10 mg daily *If you need a refill on your cardiac medications before your next appointment, please call your pharmacy*   Lab Work:  If you have labs (blood work) drawn today and your tests are completely normal, you will receive your results only by: [place] (if you have MyChart) OR A paper copy in the mail If you have any lab test that is abnormal or we need to change your treatment, we will call you to review the results.   Testing/Procedures:    Follow-Up: At [facility], you and your health needs are our priority.  As part of our continuing mission to provide you with exceptional heart care, we have created designated Provider Care Teams.  These Care Teams include your primary Cardiologist (physician) and Advanced Practice Providers (APPs -  Physician Assistants and Nurse Practitioners) who all work together to provide you with the care you need, when you need it.  We recommend signing up for the patient portal called "MyChart".  Sign up information is provided on this After Visit Summary.  MyChart is used to connect with patients for Virtual Visits (Telemedicine).  Patients are able to view lab/test results, encounter notes, upcoming appointments, etc.  Non-urgent messages can be sent to your provider as well.   To learn more about what you can do with MyChart, go to [email or web address].    Your next appointment:   8 month(s)  The format for your next appointment:   In Person  Provider:   Dorris Carnes, MD     Other Instructions   Important Information About Sugar

## 2022-03-05 NOTE — Telephone Encounter (Signed)
Prolia VOB initiated via [email or web address]  Last Prolia inj 10/06/21 Next Prolia inj due 04/06/22  Prior Auth: APPROVED PA# [id number] Valid: 10/07/21-10/07/22

## 2022-03-18 NOTE — Telephone Encounter (Signed)
Pt ready for scheduling on or after 04/06/22  Out-of-pocket cost due at time of visit: $306  Primary: [facility] Medicare Prolia co-insurance: 20% (approximately $276) Admin fee co-insurance: 20% (approximately $25)  Secondary: n/a Prolia co-insurance:  Admin fee co-insurance:   Deductible: does not apply  Prior Auth: APPROVED PA# [id number] Valid: 10/07/21-10/07/22  ** This summary of benefits is an estimation of the patient's out-of-pocket cost. Exact cost may vary based on individual plan coverage.

## 2022-03-22 NOTE — Telephone Encounter (Signed)
I believe she is a patient at a different office.

## 2022-04-13 ENCOUNTER — Other Ambulatory Visit: Payer: Self-pay | Admitting: Internal Medicine

## 2022-04-29 ENCOUNTER — Ambulatory Visit (INDEPENDENT_AMBULATORY_CARE_PROVIDER_SITE_OTHER): Payer: Medicare Other

## 2022-04-29 ENCOUNTER — Telehealth: Payer: Self-pay

## 2022-04-29 VITALS — BP 140/70 | HR 57 | Temp 97.1°F | Ht 66.0 in | Wt 127.0 lb

## 2022-04-29 DIAGNOSIS — Z0000 Encounter for general adult medical examination without abnormal findings: Secondary | ICD-10-CM | POA: Diagnosis not present

## 2022-04-29 NOTE — Patient Instructions (Signed)
Monique [name] , Thank you for taking time to come for your Medicare Wellness Visit. I appreciate your ongoing commitment to your health goals. Please review the following plan we discussed and let me know if I can assist you in the future.   Screening recommendations/referrals: Colonoscopy: Not a candidate for screening due to age 86: 09/13/2021; due every year Bone Density: 04/16/2019; done at OB/GYN office Recommended yearly ophthalmology/optometry visit for glaucoma screening and checkup Recommended yearly dental visit for hygiene and checkup  Vaccinations: Influenza vaccine: 06/28/2021 Pneumococcal vaccine: 05/15/2012, 04/24/2015 Tdap vaccine: 06/20/2017; due every 10 years Shingles vaccine: due; recommend Shingrix which is 2 doses 2-6 months apart and over 90% effective    Covid-19:11/10/2019, 12/09/2019, 08/27/2020  Advanced directives: No  Conditions/risks identified: Yes  Next appointment: Please schedule your next Medicare Wellness Visit with your Nurse Health Advisor in 1 year by calling [phone].   Preventive Care 31 Years and Older, Female Preventive care refers to lifestyle choices and visits with your health care provider that can promote health and wellness. What does preventive care include? A yearly physical exam. This is also called an annual well check. Dental exams once or twice a year. Routine eye exams. Ask your health care provider how often you should have your eyes checked. Personal lifestyle choices, including: Daily care of your teeth and gums. Regular physical activity. Eating a healthy diet. Avoiding tobacco and drug use. Limiting alcohol use. Practicing safe sex. Taking low-dose aspirin every day. Taking vitamin and mineral supplements as recommended by your health care provider. What happens during an annual well check? The services and screenings done by your health care provider during your annual well check will depend on your age, overall  health, lifestyle risk factors, and family history of disease. Counseling  Your health care provider may ask you questions about your: Alcohol use. Tobacco use. Drug use. Emotional well-being. Home and relationship well-being. Sexual activity. Eating habits. History of falls. Memory and ability to understand (cognition). Work and work Statistician. Reproductive health. Screening  You may have the following tests or measurements: Height, weight, and BMI. Blood pressure. Lipid and cholesterol levels. These may be checked every 5 years, or more frequently if you are over 26 years old. Skin check. Lung cancer screening. You may have this screening every year starting at age 54 if you have a 30-pack-year history of smoking and currently smoke or have quit within the past 15 years. Fecal occult blood test (FOBT) of the stool. You may have this test every year starting at age 53. Flexible sigmoidoscopy or colonoscopy. You may have a sigmoidoscopy every 5 years or a colonoscopy every 10 years starting at age 53. Hepatitis C blood test. Hepatitis B blood test. Sexually transmitted disease (STD) testing. Diabetes screening. This is done by checking your blood sugar (glucose) after you have not eaten for a while (fasting). You may have this done every 1-3 years. Bone density scan. This is done to screen for osteoporosis. You may have this done starting at age 15. Mammogram. This may be done every 1-2 years. Talk to your health care provider about how often you should have regular mammograms. Talk with your health care provider about your test results, treatment options, and if necessary, the need for more tests. Vaccines  Your health care provider may recommend certain vaccines, such as: Influenza vaccine. This is recommended every year. Tetanus, diphtheria, and acellular pertussis (Tdap, Td) vaccine. You may need a Td booster every 10 years. Zoster vaccine.  You may need this after age  5. Pneumococcal 13-valent conjugate (PCV13) vaccine. One dose is recommended after age 38. Pneumococcal polysaccharide (PPSV23) vaccine. One dose is recommended after age 93. Talk to your health care provider about which screenings and vaccines you need and how often you need them. This information is not intended to replace advice given to you by your health care provider. Make sure you discuss any questions you have with your health care provider. Document Released: 09/18/2015 Document Revised: 05/11/2016 Document Reviewed: 06/23/2015 Elsevier Interactive Patient Education  2017 [place] Prevention in the Home Falls can cause injuries. They can happen to people of all ages. There are many things you can do to make your home safe and to help prevent falls. What can I do on the outside of my home? Regularly fix the edges of walkways and driveways and fix any cracks. Remove anything that might make you trip as you walk through a door, such as a raised step or threshold. Trim any bushes or trees on the path to your home. Use bright outdoor lighting. Clear any walking paths of anything that might make someone trip, such as rocks or tools. Regularly check to see if handrails are loose or broken. Make sure that both sides of any steps have handrails. Any raised decks and porches should have guardrails on the edges. Have any leaves, snow, or ice cleared regularly. Use sand or salt on walking paths during winter. Clean up any spills in your garage right away. This includes oil or grease spills. What can I do in the bathroom? Use night lights. Install grab bars by the toilet and in the tub and shower. Do not use towel bars as grab bars. Use non-skid mats or decals in the tub or shower. If you need to sit down in the shower, use a plastic, non-slip stool. Keep the floor dry. Clean up any water that spills on the floor as soon as it happens. Remove soap buildup in the tub or shower  regularly. Attach bath mats securely with double-sided non-slip rug tape. Do not have throw rugs and other things on the floor that can make you trip. What can I do in the bedroom? Use night lights. Make sure that you have a light by your bed that is easy to reach. Do not use any sheets or blankets that are too big for your bed. They should not hang down onto the floor. Have a firm chair that has side arms. You can use this for support while you get dressed. Do not have throw rugs and other things on the floor that can make you trip. What can I do in the kitchen? Clean up any spills right away. Avoid walking on wet floors. Keep items that you use a lot in easy-to-reach places. If you need to reach something above you, use a strong step stool that has a grab bar. Keep electrical cords out of the way. Do not use floor polish or wax that makes floors slippery. If you must use wax, use non-skid floor wax. Do not have throw rugs and other things on the floor that can make you trip. What can I do with my stairs? Do not leave any items on the stairs. Make sure that there are handrails on both sides of the stairs and use them. Fix handrails that are broken or loose. Make sure that handrails are as long as the stairways. Check any carpeting to make sure that it is  firmly attached to the stairs. Fix any carpet that is loose or worn. Avoid having throw rugs at the top or bottom of the stairs. If you do have throw rugs, attach them to the floor with carpet tape. Make sure that you have a light switch at the top of the stairs and the bottom of the stairs. If you do not have them, ask someone to add them for you. What else can I do to help prevent falls? Wear shoes that: Do not have high heels. Have rubber bottoms. Are comfortable and fit you well. Are closed at the toe. Do not wear sandals. If you use a stepladder: Make sure that it is fully opened. Do not climb a closed stepladder. Make sure that  both sides of the stepladder are locked into place. Ask someone to hold it for you, if possible. Clearly mark and make sure that you can see: Any grab bars or handrails. First and last steps. Where the edge of each step is. Use tools that help you move around (mobility aids) if they are needed. These include: Canes. Walkers. Scooters. Crutches. Turn on the lights when you go into a dark area. Replace any light bulbs as soon as they burn out. Set up your furniture so you have a clear path. Avoid moving your furniture around. If any of your floors are uneven, fix them. If there are any pets around you, be aware of where they are. Review your medicines with your doctor. Some medicines can make you feel dizzy. This can increase your chance of falling. Ask your doctor what other things that you can do to help prevent falls. This information is not intended to replace advice given to you by your health care provider. Make sure you discuss any questions you have with your health care provider. Document Released: 06/18/2009 Document Revised: 01/28/2016 Document Reviewed: 09/26/2014 Elsevier Interactive Patient Education  2017 [employer].

## 2022-04-29 NOTE — Telephone Encounter (Signed)
Patient is in need of refills: Pantoprazole and Mirtazapine to be sent to [facility].

## 2022-04-29 NOTE — Telephone Encounter (Signed)
Due for physical. Please schedule then okay for 90 day no refill

## 2022-04-29 NOTE — Progress Notes (Signed)
Subjective:   Monique [name] is a 86 y.o. female who presents for Medicare Annual (Subsequent) preventive examination.  Review of Systems     Cardiac Risk Factors include: advanced age (>70mn, >>47women);diabetes mellitus;dyslipidemia;family history of premature cardiovascular disease;hypertension     Objective:    Today's Vitals   04/29/22 1542  BP: (!) 140/70  Pulse: (!) 57  Temp: (!) 97.1 F (36.2 C)  SpO2: 97%  Weight: 127 lb (57.6 kg)  Height: '5\' 6"'$  (1.676 m)  PainSc: 0-No pain   Body mass index is 20.5 kg/m.     04/29/2022    4:27 PM 04/28/2021    4:18 PM 08/21/2019    6:01 PM 09/07/2017    1:55 PM 09/07/2017   11:06 AM 11/28/2014   10:59 PM 11/21/2014   10:29 AM  Advanced Directives  Does Patient Have a Medical Advance Directive? No No No No No No No  Would patient like information on creating a medical advance directive? No - Patient declined No - Patient declined No - Patient declined No - Patient declined  No - patient declined information No - patient declined information    Current Medications (verified) Outpatient Encounter Medications as of 04/29/2022  Medication Sig   acetaminophen (TYLENOL) 500 MG tablet Take 1,000 mg by mouth every 6 (six) hours as needed for moderate pain or headache.   amLODipine (NORVASC) 5 MG tablet TAKE 1 TABLET BY MOUTH TWICE A DAY   atorvastatin (LIPITOR) 10 MG tablet Take 1 tablet (10 mg total) by mouth daily.   disopyramide (NORPACE) 150 MG capsule TAKE 1 CAPSULE BY MOUTH TWICE A DAY   lidocaine (LIDODERM) 5 % Place 1 patch onto the skin daily.   metFORMIN (GLUCOPHAGE) 500 MG tablet TAKE 1 TABLET BY MOUTH EVERY DAY WITH BREAKFAST   metoprolol tartrate (LOPRESSOR) 50 MG tablet Take 1 tablet (50 mg total) by mouth 2 (two) times daily.   mirtazapine (REMERON) 15 MG tablet TAKE 1 TABLET BY MOUTH EVERYDAY AT BEDTIME   nitroGLYCERIN (NITROSTAT) 0.4 MG SL tablet Place 1 tablet (0.4 mg total) under the tongue every 5 (five) minutes as  needed for chest pain.   pantoprazole (PROTONIX) 40 MG tablet TAKE 1 TABLET BY MOUTH EVERY DAY   XARELTO 15 MG TABS tablet TAKE 1 TABLET BY MOUTH EVERY DAY WITH SUPPER   trospium (SANCTURA) 20 MG tablet Take 1 tablet (20 mg total) by mouth 2 (two) times daily. (Patient not taking: Reported on 03/02/2022)   [DISCONTINUED] mirabegron ER (MYRBETRIQ) 25 MG TB24 tablet Take 1 tablet (25 mg total) by mouth daily.   No facility-administered encounter medications on file as of 04/29/2022.    Allergies (verified) Patient has no known allergies.   History: Past Medical History:  Diagnosis Date   Abdominal pain 11/18/2015   Acute respiratory failure with hypoxia ([facility] 06/27/2014   Breast pain, left 10/11/2016   Cerebral aneurysm 2000 06/27/2014   Chest pain 10/30/2014   CHF (congestive heart failure) ([facility]    Chronic anticoagulation 06/2014   Coumadin started after bilateral PE   Chronic kidney disease, stage III (moderate) ([facility] 11/21/2014   Clotting disorder ([facility]    right was removed, still have left cataract   Diabetes ([facility]    Dizziness and giddiness 11/28/2014   DVT of lower extremity (deep venous thrombosis) ([facility] 11/28/2014   Dysuria 07/07/2014   Elevated troponin 11/28/2014   Fatty liver    Gastritis 05/16/2012   Chronic dyspepsia on PPI daily.  Headache, acute 06/08/2010   Qualifier: Diagnosis of  By: Linda Hedges MD, Heinz Knuckles    Heart murmur    Hematochezia 11/18/2015   History of fibromuscular dysplasia 06/27/2014   HTN (hypertension)    Essential   Hyperlipidemia    Hypertrophic obstructive cardiomyopathy (HOCM) ([place]) 05/20/2008   CMR 4/22: EF 72, small amount of patchy LGE in basal septum c/w HCM, asymmetric basal septal hypertrophy (15 mm) c/w HCM, no ischemia, ascending aorta 38 mm   Kidney stones    LVH (left ventricular hypertrophy)    Orthostatic hypotension 11/28/2014   PAF (paroxysmal atrial fibrillation) ([place]) 2000   Palpitations 11/28/2014   PAROXYSMAL ATRIAL FIBRILLATION  05/20/2008   Qualifier: Diagnosis of  By: Linda Hedges MD, Heinz Knuckles    PE (pulmonary embolism) 06/2014   Bilateral   Postmenopausal osteoporosis 04/2019   T score -3.3   Pulmonary embolism, bilateral ([place]) 06/27/2014   SOB (shortness of breath) 06/27/2014   Subarachnoid hemorrhage ([place]) 2000   UTI (urinary tract infection) 01/04/2015   Past Surgical History:  Procedure Laterality Date   ABDOMINAL HYSTERECTOMY  2000   Brain hemorrage     2000   CARDIAC CATHETERIZATION  05/2008   Nl cors, no gradient, EF 60%   Family History  Problem Relation Age of Onset   Breast cancer Mother 30       Deceased   Stroke Mother    Arrhythmia Mother    Heart disease Mother    Colon cancer Sister    Cervical cancer Sister    Arrhythmia Sister    Breast cancer Daughter 43   Esophageal cancer Neg Hx    Pancreatic cancer Neg Hx    Rectal cancer Neg Hx    Stomach cancer Neg Hx    Social History   Socioeconomic History   Marital status: Divorced    Spouse name: Not on file   Number of children: 4   Years of education: 12   Highest education level: Not on file  Occupational History   Occupation: Retired Doctor, hospital  Tobacco Use   Smoking status: Former    Types: Cigarettes    Quit date: 09/06/1967    Years since quitting: 17.6   Smokeless tobacco: Never  Vaping Use   Vaping Use: Never used  Substance and Sexual Activity   Alcohol use: No    Alcohol/week: 0.0 standard drinks of alcohol   Drug use: No   Sexual activity: Not Currently    Comment: 1st intercourse 86yo-Fewer than 5 partners  Other Topics Concern   Not on file  Social History Narrative   HSG. Married '61 - '88/divorced. 3 sons - '74, '59, '62, 1 daughter '63; 5 grandchildren. 10/06/20 Lives alone in two story home.    She does not drive.   Retired from [employer].   Social Determinants of Health   Financial Resource Strain: Low Risk  (04/29/2022)   Overall Financial Resource Strain (CARDIA)     Difficulty of Paying Living Expenses: Not hard at all  Food Insecurity: No Food Insecurity (04/29/2022)   Hunger Vital Sign    Worried About Running Out of Food in the Last Year: Never true    Ran Out of Food in the Last Year: Never true  Transportation Needs: No Transportation Needs (04/29/2022)   PRAPARE - Hydrologist (Medical): No    Lack of Transportation (Non-Medical): No  Physical Activity: Sufficiently Active (04/29/2022)   Exercise Vital Sign  Days of Exercise per Week: 5 days    Minutes of Exercise per Session: 30 min  Stress: No Stress Concern Present (04/29/2022)   [place]    Feeling of Stress : Not at all  Social Connections: Moderately Integrated (04/29/2022)   Social Connection and Isolation Panel [NHANES]    Frequency of Communication with Friends and Family: More than three times a week    Frequency of Social Gatherings with Friends and Family: More than three times a week    Attends Religious Services: More than 4 times per year    Active Member of [employer] or Organizations: Yes    Attends Music therapist: More than 4 times per year    Marital Status: Divorced    Tobacco Counseling Counseling given: Not Answered   Clinical Intake:  Pre-visit preparation completed: Yes  Pain : No/denies pain Pain Score: 0-No pain     BMI - recorded: 20.5 Nutritional Status: BMI of 19-24  Normal Nutritional Risks: None Diabetes: Yes CBG done?: No Did pt. bring in CBG monitor from home?: No  How often do you need to have someone help you when you read instructions, pamphlets, or other written materials from your doctor or pharmacy?: 1 - Never What is the last grade level you completed in school?: HSG  Diabetic? yes  Interpreter Needed?: No  Information entered by :: Lisette Abu, LPN.   Activities of Daily Living    04/29/2022    4:29 PM  In your  present state of health, do you have any difficulty performing the following activities:  Hearing? 0  Vision? 0  Difficulty concentrating or making decisions? 0  Walking or climbing stairs? 0  Dressing or bathing? 0  Doing errands, shopping? 0  Preparing Food and eating ? N  Using the Toilet? N  In the past six months, have you accidently leaked urine? N  Do you have problems with loss of bowel control? N  Managing your Medications? N  Managing your Finances? N  Housekeeping or managing your Housekeeping? N    Patient Care Team: Hoyt Koch, MD as PCP - General (Internal Medicine) Fay Records, MD as PCP - Cardiology (Cardiology) Fay Records, MD (Cardiology) Charlton Haws, [facility] as Pharmacist (Pharmacist) Monna Fam, MD as Consulting Physician (Ophthalmology)  Indicate any recent Medical Services you may have received from other than [facility] providers in the past year (date may be approximate).     Assessment:   This is a routine wellness examination for Monique [name].  Hearing/Vision screen Hearing Screening - Comments:: Patient denied any hearing difficulty.   No hearing aids.  Vision Screening - Comments:: Patient does wear readers.  Eye exam done by: Monna Fam, MD.   Dietary issues and exercise activities discussed: Current Exercise Habits: Home exercise routine, Type of exercise: walking, Time (Minutes): 30, Frequency (Times/Week): 5, Weekly Exercise (Minutes/Week): 150, Intensity: Moderate, Exercise limited by: None identified   Goals Addressed             This Visit's Progress    Patient declined health goal at this time.        Depression Screen    04/29/2022    4:26 PM 01/06/2022   10:46 AM 04/28/2021    4:16 PM 04/17/2020    2:19 PM 02/12/2019    2:08 PM 07/16/2018    1:05 PM 06/20/2017   11:09 AM  PHQ 2/9 Scores  PHQ - 2  Score 0 0 0 0 0 0 0    Fall Risk    04/29/2022    4:28 PM 01/06/2022   10:46 AM 04/28/2021    4:20 PM 03/19/2021     9:12 AM 01/02/2020   10:26 AM  Fall Risk   Falls in the past year? 0 0 0 0 0  Number falls in past yr: 0 0 0 0 0  Injury with Fall? 0 0 0 0 0  Risk for fall due to : No Fall Risks No Fall Risks No Fall Risks    Follow up Falls evaluation completed Falls evaluation completed Falls evaluation completed      FALL RISK PREVENTION PERTAINING TO THE HOME:  Any stairs in or around the home? No  If so, are there any without handrails? No  Home free of loose throw rugs in walkways, pet beds, electrical cords, etc? Yes  Adequate lighting in your home to reduce risk of falls? Yes   ASSISTIVE DEVICES UTILIZED TO PREVENT FALLS:  Life alert? No  Use of a cane, walker or w/c? No  Grab bars in the bathroom? No  Shower chair or bench in shower? No  Elevated toilet seat or a handicapped toilet? Yes   TIMED UP AND GO:  Was the test performed? Yes .  Length of time to ambulate 10 feet: 8 sec.   Gait steady and fast without use of assistive device  Cognitive Function:        04/29/2022    4:29 PM  6CIT Screen  What Year? 0 points  What month? 0 points  What time? 0 points  Count back from 20 0 points  Months in reverse 0 points  Repeat phrase 0 points  Total Score 0 points    Immunizations Immunization History  Administered Date(s) Administered   Fluad Quad(high Dose 65+) 07/16/2019, 06/19/2020, 06/28/2021   Influenza Split 05/15/2012   Influenza Whole 09/03/2009   Influenza, High Dose Seasonal PF 10/11/2016, 06/20/2017, 07/16/2018   Influenza,inj,Quad PF,6+ Mos 06/28/2014, 09/17/2015   PFIZER(Purple Top)SARS-COV-2 Vaccination 11/10/2019, 12/09/2019, 08/27/2020   Pneumococcal Conjugate-13 04/24/2015   Pneumococcal Polysaccharide-23 05/15/2012   Td 11/08/2002   Tdap 06/20/2017   Zoster, Live 04/09/2013    TDAP status: Up to date  Flu Vaccine status: Up to date  Pneumococcal vaccine status: Up to date  Covid-19 vaccine status: Completed vaccines  Qualifies for  Shingles Vaccine? Yes   Zostavax completed Yes   Shingrix Completed?: No.    Education has been provided regarding the importance of this vaccine. Patient has been advised to call insurance company to determine out of pocket expense if they have not yet received this vaccine. Advised may also receive vaccine at local pharmacy or Health Dept. Verbalized acceptance and understanding.  Screening Tests Health Maintenance  Topic Date Due   Zoster Vaccines- Shingrix (1 of 2) Never done   OPHTHALMOLOGY EXAM  11/22/2018   COVID-19 Vaccine (4 - Pfizer series) 10/22/2020   INFLUENZA VACCINE  04/05/2022   HEMOGLOBIN A1C  05/07/2022   MAMMOGRAM  09/13/2022   Diabetic kidney evaluation - Urine ACR  11/05/2022   FOOT EXAM  11/05/2022   Diabetic kidney evaluation - GFR measurement  12/08/2022   TETANUS/TDAP  06/21/2027   Pneumonia Vaccine 58+ Years old  Completed   DEXA SCAN  Completed   HPV VACCINES  Aged Out    Health Maintenance  Health Maintenance Due  Topic Date Due   Zoster Vaccines- Shingrix (1 of 2)  Never done   OPHTHALMOLOGY EXAM  11/22/2018   COVID-19 Vaccine (4 - Pfizer series) 10/22/2020   INFLUENZA VACCINE  04/05/2022    Colorectal cancer screening: No longer required.   Mammogram status: Completed 09/13/2021. Repeat every year  Bone Density: last done at OB/GYN on 04/16/2019  Lung Cancer Screening: (Low Dose CT Chest recommended if Age 41-80 years, 30 pack-year currently smoking OR have quit w/in 15years.) does not qualify.   Lung Cancer Screening Referral: no  Additional Screening:  Hepatitis C Screening: does not qualify; Completed no  Vision Screening: Recommended annual ophthalmology exams for early detection of glaucoma and other disorders of the eye. Is the patient up to date with their annual eye exam?  Yes  Who is the provider or what is the name of the office in which the patient attends annual eye exams? Monna Fam, MD. If pt is not established with a  provider, would they like to be referred to a provider to establish care? No .   Dental Screening: Recommended annual dental exams for proper oral hygiene  Community Resource Referral / Chronic Care Management: CRR required this visit?  No   CCM required this visit?  No      Plan:     I have personally reviewed and noted the following in the patient's chart:   Medical and social history Use of alcohol, tobacco or illicit drugs  Current medications and supplements including opioid prescriptions. Patient is not currently taking opioid prescriptions. Functional ability and status Nutritional status Physical activity Advanced directives List of other physicians Hospitalizations, surgeries, and ER visits in previous 12 months Vitals Screenings to include cognitive, depression, and falls Referrals and appointments  In addition, I have reviewed and discussed with patient certain preventive protocols, quality metrics, and best practice recommendations. A written personalized care plan for preventive services as well as general preventive health recommendations were provided to patient.     Sheral Flow, LPN   5/85/2778   Nurse Notes:  Normal cognitive status assessed by direct observation by this Nurse Health Advisor. No abnormalities found.  Hearing Screening - Comments:: Patient denied any hearing difficulty.   No hearing aids.  Vision Screening - Comments:: Patient does wear readers.  Eye exam done by: Monna Fam, MD.

## 2022-05-06 ENCOUNTER — Encounter: Payer: Self-pay | Admitting: Internal Medicine

## 2022-05-06 ENCOUNTER — Ambulatory Visit (INDEPENDENT_AMBULATORY_CARE_PROVIDER_SITE_OTHER): Payer: Medicare Other | Admitting: Internal Medicine

## 2022-05-06 VITALS — BP 138/72 | HR 55 | Temp 97.6°F | Ht 66.0 in | Wt 127.0 lb

## 2022-05-06 DIAGNOSIS — I2699 Other pulmonary embolism without acute cor pulmonale: Secondary | ICD-10-CM

## 2022-05-06 DIAGNOSIS — R0789 Other chest pain: Secondary | ICD-10-CM | POA: Diagnosis not present

## 2022-05-06 DIAGNOSIS — Z0000 Encounter for general adult medical examination without abnormal findings: Secondary | ICD-10-CM

## 2022-05-06 DIAGNOSIS — E118 Type 2 diabetes mellitus with unspecified complications: Secondary | ICD-10-CM

## 2022-05-06 DIAGNOSIS — N1832 Chronic kidney disease, stage 3b: Secondary | ICD-10-CM

## 2022-05-06 LAB — COMPREHENSIVE METABOLIC PANEL
ALT: 12 U/L (ref 0–35)
AST: 24 U/L (ref 0–37)
Albumin: 4.1 g/dL (ref 3.5–5.2)
Alkaline Phosphatase: 50 U/L (ref 39–117)
BUN: 30 mg/dL — ABNORMAL HIGH (ref 6–23)
CO2: 30 mEq/L (ref 19–32)
Calcium: 10.1 mg/dL (ref 8.4–10.5)
Chloride: 106 mEq/L (ref 96–112)
Creatinine, Ser: 1.31 mg/dL — ABNORMAL HIGH (ref 0.40–1.20)
GFR: 37.51 mL/min — ABNORMAL LOW (ref >60.00)
Glucose, Bld: 84 mg/dL (ref 70–99)
Potassium: 4.5 mEq/L (ref 3.5–5.1)
Sodium: 142 mEq/L (ref 135–145)
Total Bilirubin: 0.5 mg/dL (ref 0.2–1.2)
Total Protein: 7.4 g/dL (ref 6.0–8.3)

## 2022-05-06 LAB — HEMOGLOBIN A1C: Hgb A1c MFr Bld: 6.9 % — ABNORMAL HIGH (ref 4.6–6.5)

## 2022-05-06 MED ORDER — PANTOPRAZOLE SODIUM 40 MG PO TBEC: 40.0000 mg | DELAYED_RELEASE_TABLET | Freq: Every day | ORAL | 3 refills | Status: DC

## 2022-05-06 MED ORDER — MIRTAZAPINE 15 MG PO TABS: 15.0000 mg | ORAL_TABLET | Freq: Every day | ORAL | 3 refills | Status: DC

## 2022-05-06 NOTE — Assessment & Plan Note (Signed)
Takes xarelto to prevent recurrence and has not missed doses.

## 2022-05-06 NOTE — Assessment & Plan Note (Signed)
Checking CMP to check for stability.

## 2022-05-06 NOTE — Assessment & Plan Note (Signed)
Still going on and will refer to cardiology. History is less typical for cardiac but has risk factors with diabetes and blood pressure and hyperlipidemia for consideration of stress testing.

## 2022-05-06 NOTE — Assessment & Plan Note (Signed)
Checking HgA1c and CMP and adjust as needed.

## 2022-05-06 NOTE — Patient Instructions (Addendum)
We will check the sugars today.  You can get the shingles vaccine at the pharmacy if you want.  Make sure to see the eye doctor.

## 2022-05-06 NOTE — Assessment & Plan Note (Signed)
Flu shot yearly. Covid-19 counseled. Pneumonia complete. Shingrix counseled to get at pharmacy. Tetanus up to date. Colonoscopy aged out. Mammogram up to date, pap smear up to date and dexa complete. Counseled about sun safety and mole surveillance. Counseled about the dangers of distracted driving. Given 10 year screening recommendations.

## 2022-05-06 NOTE — Progress Notes (Signed)
   Subjective:   Patient ID: Monique [name], female    DOB: August 15, 1936, 86 y.o.   MRN: [id number]  HPI The patient is here for physical.  PMH, [facility], social history reviewed and updated  Review of Systems  Constitutional: Negative.   HENT: Negative.    Eyes: Negative.   Respiratory:  Negative for cough, chest tightness and shortness of breath.   Cardiovascular:  Negative for chest pain, palpitations and leg swelling.  Gastrointestinal:  Negative for abdominal distention, abdominal pain, constipation, diarrhea, nausea and vomiting.  Musculoskeletal: Negative.   Skin: Negative.   Neurological: Negative.   Psychiatric/Behavioral: Negative.      Objective:  Physical Exam Constitutional:      Appearance: She is well-developed.  HENT:     Head: Normocephalic and atraumatic.  Cardiovascular:     Rate and Rhythm: Normal rate and regular rhythm.  Pulmonary:     Effort: Pulmonary effort is normal. No respiratory distress.     Breath sounds: Normal breath sounds. No wheezing or rales.  Abdominal:     General: Bowel sounds are normal. There is no distension.     Palpations: Abdomen is soft.     Tenderness: There is no abdominal tenderness. There is no rebound.  Musculoskeletal:     Cervical back: Normal range of motion.  Skin:    General: Skin is warm and dry.  Neurological:     Mental Status: She is alert and oriented to person, place, and time.     Coordination: Coordination normal.     Vitals:   05/06/22 1334 05/06/22 1354  BP: (!) 140/72 138/72  Pulse: (!) 55   Temp: 97.6 F (36.4 C)   TempSrc: Temporal   SpO2: 98%   Weight: 127 lb (57.6 kg)   Height: '5\' 6"'$  (1.676 m)     Assessment & Plan:

## 2022-06-07 ENCOUNTER — Other Ambulatory Visit: Payer: Self-pay | Admitting: Internal Medicine

## 2022-06-17 ENCOUNTER — Ambulatory Visit: Payer: Medicare Other | Attending: Physician Assistant | Admitting: Physician Assistant

## 2022-06-17 ENCOUNTER — Encounter: Payer: Self-pay | Admitting: Physician Assistant

## 2022-06-17 VITALS — BP 124/84 | HR 63 | Ht 66.0 in | Wt 127.0 lb

## 2022-06-17 DIAGNOSIS — I10 Essential (primary) hypertension: Secondary | ICD-10-CM

## 2022-06-17 DIAGNOSIS — I7121 Aneurysm of the ascending aorta, without rupture: Secondary | ICD-10-CM

## 2022-06-17 DIAGNOSIS — E782 Mixed hyperlipidemia: Secondary | ICD-10-CM | POA: Diagnosis not present

## 2022-06-17 DIAGNOSIS — I480 Paroxysmal atrial fibrillation: Secondary | ICD-10-CM | POA: Diagnosis not present

## 2022-06-17 DIAGNOSIS — I421 Obstructive hypertrophic cardiomyopathy: Secondary | ICD-10-CM | POA: Diagnosis not present

## 2022-06-17 NOTE — Patient Instructions (Signed)
Medication Instructions:  Your physician recommends that you continue on your current medications as directed. Please refer to the Current Medication list given to you today. *If you need a refill on your cardiac medications before your next appointment, please call your pharmacy*   Lab Work: None ordered   Testing/Procedures: None ordered   Follow-Up: At [facility] you and your health needs are our priority.  As part of our continuing mission to provide you with exceptional heart care, we have created designated Provider Care Teams.  These Care Teams include your primary Cardiologist (physician) and Advanced Practice Providers (APPs -  Physician Assistants and Nurse Practitioners) who all work together to provide you with the care you need, when you need it.  We recommend signing up for the patient portal called "MyChart".  Sign up information is provided on this After Visit Summary.  MyChart is used to connect with patients for Virtual Visits (Telemedicine).  Patients are able to view lab/test results, encounter notes, upcoming appointments, etc.  Non-urgent messages can be sent to your provider as well.   To learn more about what you can do with MyChart, go to [email or web address].    Your next appointment:   6 month(s)  The format for your next appointment:   In Person  Provider:   Dorris Carnes, MD     Other Instructions   Important Information About Sugar

## 2022-06-17 NOTE — Progress Notes (Signed)
Cardiology Office Note:    Date:  06/17/2022   ID:  Monique [name], DOB [birth year], MRN [id number]  PCP:  Hoyt Koch, MD  [facility] Cardiologist:  Dorris Carnes, MD  [facility] Electrophysiologist:  None   Chief Complaint: 4 months follow up   History of Present Illness:    Monique [name] is a 86 y.o. female with a hx of  Hypertrophic obstructive cardiomyopathy Echocardiogram 1/19: Gradient 34 mmHg Echocardiogram 09/2019: no asymmetric hypertrophy or SAM, gradient 5 mmHg CMRI 12/2020: Asymmetric hypertrophy of basal septum measuring up to 43m (669min inferolateral wall), meeting criteria for   hypertrophic cardiomyopathy; Small amount of patchy LGE (<1% of total myocardial mass) in basal septum, consistent with HCM Hypertension Paroxysmal atrial fibrillation CHA2DS2-VASc=4 (HTN, age x 2, female) >> Rivaroxaban CrCl (12/2019): 35 >> Rivaroxaban 15 mg  Supraventricular Tachycardia  Disopyramide, Metoprolol  Thoracic aortic aneurysm CT 12/2019: 4 cm History of DVT  Hx of bilateral pulmonary embolism Chronic kidney disease stage III DM Cerebral aneurysm Hx of subarachnoid hemorrhage Hyperlipidemia Atypical chest pain --> Cardiac catheterization 2009: No CAD Myoview 1/19: Normal perfusion  Myoview 08/2019: Low risk Coronary CT 12/2021: Calcium score of 0.    Last seen by Dr. RoHarrington Challenger/2023.   Patient is here for follow-up.  She has intermittent left axillary pain.  Worse with laying on left side and also reproducible with palpation on exam.  She denies shortness of breath, orthopnea, PND, syncope, dizziness or melena.  Intermittent palpitation but not long-lasting.  No bleeding issue.  Takes her medication.  Mostly sedentary lifestyle.  Past Medical History:  Diagnosis Date   Abdominal pain 11/18/2015   Acute respiratory failure with hypoxia (HCMarietta10/23/2015   Breast pain, left 10/11/2016   Cerebral aneurysm 2000 06/27/2014   Chest pain 10/30/2014   CHF  (congestive heart failure) (HCLagrange   Chronic anticoagulation 06/2014   Coumadin started after bilateral PE   Chronic kidney disease, stage III (moderate) (HCCosmopolis3/18/2016   Clotting disorder (HCVian   right was removed, still have left cataract   Diabetes (HCMcMullen   Dizziness and giddiness 11/28/2014   DVT of lower extremity (deep venous thrombosis) (HCHenryville3/25/2016   Dysuria 07/07/2014   Elevated troponin 11/28/2014   Fatty liver    Gastritis 05/16/2012   Chronic dyspepsia on PPI daily.    Headache, acute 06/08/2010   Qualifier: Diagnosis of  By: NoLinda HedgesD, MiHeinz Knuckles  Heart murmur    Hematochezia 11/18/2015   History of fibromuscular dysplasia 06/27/2014   HTN (hypertension)    Essential   Hyperlipidemia    Hypertrophic obstructive cardiomyopathy (HOCM) (HCAlta09/15/2009   CMR 4/22: EF 72, small amount of patchy LGE in basal septum c/w HCM, asymmetric basal septal hypertrophy (15 mm) c/w HCM, no ischemia, ascending aorta 38 mm   Kidney stones    LVH (left ventricular hypertrophy)    Orthostatic hypotension 11/28/2014   PAF (paroxysmal atrial fibrillation) (HCWestport2000   Palpitations 11/28/2014   PAROXYSMAL ATRIAL FIBRILLATION 05/20/2008   Qualifier: Diagnosis of  By: NoLinda HedgesD, MiHeinz Knuckles  PE (pulmonary embolism) 06/2014   Bilateral   Postmenopausal osteoporosis 04/2019   T score -3.3   Pulmonary embolism, bilateral (HCTaylor Landing10/23/2015   SOB (shortness of breath) 06/27/2014   Subarachnoid hemorrhage (HCOrr2000   UTI (urinary tract infection) 01/04/2015    Past Surgical History:  Procedure Laterality Date   ABDOMINAL HYSTERECTOMY  2000  Brain hemorrage     2000   CARDIAC CATHETERIZATION  05/2008   Nl cors, no gradient, EF 60%    Current Medications: Current Meds  Medication Sig   acetaminophen (TYLENOL) 500 MG tablet Take 1,000 mg by mouth every 6 (six) hours as needed for moderate pain or headache.   amLODipine (NORVASC) 5 MG tablet TAKE 1 TABLET BY MOUTH TWICE A DAY    atorvastatin (LIPITOR) 10 MG tablet Take 1 tablet (10 mg total) by mouth daily.   atorvastatin (LIPITOR) 20 MG tablet Take 1 tablet by mouth daily.   disopyramide (NORPACE) 150 MG capsule Take 1 capsule (150 mg total) by mouth 2 (two) times daily. Appointment needed for future refills. Thank you.   lidocaine (LIDODERM) 5 % Place 1 patch onto the skin daily.   metFORMIN (GLUCOPHAGE) 500 MG tablet TAKE 1 TABLET BY MOUTH EVERY DAY WITH BREAKFAST   metoprolol tartrate (LOPRESSOR) 50 MG tablet Take 1 tablet (50 mg total) by mouth 2 (two) times daily.   mirtazapine (REMERON) 15 MG tablet Take 1 tablet (15 mg total) by mouth at bedtime.   nitroGLYCERIN (NITROSTAT) 0.4 MG SL tablet Place 1 tablet (0.4 mg total) under the tongue every 5 (five) minutes as needed for chest pain.   pantoprazole (PROTONIX) 40 MG tablet Take 1 tablet (40 mg total) by mouth daily.   trospium (SANCTURA) 20 MG tablet Take 1 tablet (20 mg total) by mouth 2 (two) times daily.   XARELTO 15 MG TABS tablet TAKE 1 TABLET BY MOUTH EVERY DAY WITH SUPPER     Allergies:   Patient has no known allergies.   Social History   Socioeconomic History   Marital status: Divorced    Spouse name: Not on file   Number of children: 4   Years of education: 12   Highest education level: Not on file  Occupational History   Occupation: Retired Doctor, hospital  Tobacco Use   Smoking status: Former    Types: Cigarettes    Quit date: 09/06/1967    Years since quitting: 54.8   Smokeless tobacco: Never  Vaping Use   Vaping Use: Never used  Substance and Sexual Activity   Alcohol use: No    Alcohol/week: 0.0 standard drinks of alcohol   Drug use: No   Sexual activity: Not Currently    Comment: 1st intercourse 86yo-Fewer than 5 partners  Other Topics Concern   Not on file  Social History Narrative   HSG. Married '61 - '88/divorced. 3 sons - '74, '59, '62, 1 daughter '63; 5 grandchildren. 10/06/20 Lives alone in two story home.    She does not  drive.   Retired from [employer].   Social Determinants of Health   Financial Resource Strain: Low Risk  (04/29/2022)   Overall Financial Resource Strain (CARDIA)    Difficulty of Paying Living Expenses: Not hard at all  Food Insecurity: No Food Insecurity (04/29/2022)   Hunger Vital Sign    Worried About Running Out of Food in the Last Year: Never true    Ran Out of Food in the Last Year: Never true  Transportation Needs: No Transportation Needs (04/29/2022)   PRAPARE - Hydrologist (Medical): No    Lack of Transportation (Non-Medical): No  Physical Activity: Sufficiently Active (04/29/2022)   Exercise Vital Sign    Days of Exercise per Week: 5 days    Minutes of Exercise per Session: 30 min  Stress: No Stress  Concern Present (04/29/2022)   [place]    Feeling of Stress : Not at all  Social Connections: Moderately Integrated (04/29/2022)   Social Connection and Isolation Panel [NHANES]    Frequency of Communication with Friends and Family: More than three times a week    Frequency of Social Gatherings with Friends and Family: More than three times a week    Attends Religious Services: More than 4 times per year    Active Member of [employer] or Organizations: Yes    Attends Music therapist: More than 4 times per year    Marital Status: Divorced     Family History: The patient's family history includes Arrhythmia in her mother and sister; Breast cancer (age of onset: 63) in her daughter; Breast cancer (age of onset: 57) in her mother; Cervical cancer in her sister; Colon cancer in her sister; Heart disease in her mother; Stroke in her mother. There is no history of Esophageal cancer, Pancreatic cancer, Rectal cancer, or Stomach cancer.    ROS:   Please see the history of present illness.    All other systems reviewed and are negative.   EKGs/Labs/Other Studies  Reviewed:    The following studies were reviewed today:  Coronary CT 12/2021 IMPRESSION: 1. Coronary calcium score of 0. This was 0 percentile for age and sex matched control.   2. Normal coronary origin with right dominance.   3. CAD-RADS 0. No evidence of CAD (0%). Consider non-atherosclerotic causes of chest pain.   The noncardiac portion of this study will be interpreted in separate report by the radiologist.  EKG:  EKG is not  ordered today.   Recent Labs: 11/04/2021: Hemoglobin 13.0; Platelets 188.0 05/06/2022: ALT 12; BUN 30; Creatinine, Ser 1.31; Potassium 4.5; Sodium 142  Recent Lipid Panel    Component Value Date/Time   CHOL 125 11/04/2021 0915   CHOL 140 11/11/2016 0900   TRIG 60.0 11/04/2021 0915   HDL 60.10 11/04/2021 0915   HDL 51 11/11/2016 0900   CHOLHDL 2 11/04/2021 0915   VLDL 12.0 11/04/2021 0915   LDLCALC 53 11/04/2021 0915   LDLCALC 52 04/17/2020 1451     Risk Assessment/Calculations:    CHA2DS2-VASc Score = 4   This indicates a 4.8% annual risk of stroke. The patient's score is based upon: CHF History: 0 HTN History: 1 Diabetes History: 0 Stroke History: 0 Vascular Disease History: 0 Age Score: 2 Gender Score: 1    Physical Exam:    VS:  BP 124/84   Pulse 63   Ht '5\' 6"'$  (1.676 m)   Wt 127 lb (57.6 kg)   SpO2 97%   BMI 20.50 kg/m     Wt Readings from Last 3 Encounters:  06/17/22 127 lb (57.6 kg)  05/06/22 127 lb (57.6 kg)  04/29/22 127 lb (57.6 kg)     GEN:  Well nourished, well developed in no acute distress HEENT: Normal NECK: No JVD; No carotid bruits LYMPHATICS: No lymphadenopathy CARDIAC: RRR, + murmurs, rubs, gallops RESPIRATORY:  Clear to auscultation without rales, wheezing or rhonchi  ABDOMEN: Soft, non-tender, non-distended MUSCULOSKELETAL:  No edema; No deformity  SKIN: Warm and dry NEUROLOGIC:  Alert and oriented x 3 PSYCHIATRIC:  Normal affect   ASSESSMENT AND PLAN:    Left axillary pain Consistent with  musculoskeletal in etiology.  Reproducible with palpation.  Helps with lidocaine patch.  2.  HCM -Cardiac MRI as above -Continue beta-blocker  3.  Chronic anticoagulation for paroxysmal atrial fibrillation and history of DVT/PE -Tolerating Xarelto 15 mg daily. -No bleeding issue -Sinus rhythm on exam  4.  Hypertension -Blood pressure stable and controlled on current medications  Medication Adjustments/Labs and Tests Ordered: Current medicines are reviewed at length with the patient today.  Concerns regarding medicines are outlined above.  No orders of the defined types were placed in this encounter.  No orders of the defined types were placed in this encounter.   Patient Instructions  Medication Instructions:  Your physician recommends that you continue on your current medications as directed. Please refer to the Current Medication list given to you today. *If you need a refill on your cardiac medications before your next appointment, please call your pharmacy*   Lab Work: None ordered   Testing/Procedures: None ordered   Follow-Up: At [facility], you and your health needs are our priority.  As part of our continuing mission to provide you with exceptional heart care, we have created designated Provider Care Teams.  These Care Teams include your primary Cardiologist (physician) and Advanced Practice Providers (APPs -  Physician Assistants and Nurse Practitioners) who all work together to provide you with the care you need, when you need it.  We recommend signing up for the patient portal called "MyChart".  Sign up information is provided on this After Visit Summary.  MyChart is used to connect with patients for Virtual Visits (Telemedicine).  Patients are able to view lab/test results, encounter notes, upcoming appointments, etc.  Non-urgent messages can be sent to your provider as well.   To learn more about what you can do with MyChart, go to  [email or web address].    Your next appointment:   6 month(s)  The format for your next appointment:   In Person  Provider:   Dorris Carnes, MD     Other Instructions   Important Information About Sugar         Jarrett Soho, [place]  06/17/2022 8:24 AM    [place]

## 2022-07-12 ENCOUNTER — Other Ambulatory Visit: Payer: Self-pay | Admitting: Internal Medicine

## 2022-07-22 ENCOUNTER — Ambulatory Visit (INDEPENDENT_AMBULATORY_CARE_PROVIDER_SITE_OTHER): Payer: Medicare Other | Admitting: Internal Medicine

## 2022-07-22 ENCOUNTER — Encounter: Payer: Self-pay | Admitting: Internal Medicine

## 2022-07-22 VITALS — BP 140/80 | HR 57 | Temp 97.9°F | Ht 66.0 in | Wt 126.0 lb

## 2022-07-22 DIAGNOSIS — R0789 Other chest pain: Secondary | ICD-10-CM

## 2022-07-22 DIAGNOSIS — Z23 Encounter for immunization: Secondary | ICD-10-CM

## 2022-07-22 MED ORDER — LIDOCAINE 5 % EX PTCH: 1.0000 | MEDICATED_PATCH | CUTANEOUS | 11 refills | Status: DC

## 2022-07-22 NOTE — Progress Notes (Signed)
   Subjective:   Patient ID: Monique [name], female    DOB: 21-Sep-1935, 86 y.o.   MRN: [id number]  Chest Pain  Pertinent negatives include no abdominal pain, cough, nausea, palpitations, shortness of breath or vomiting.   The patient is an 86 YO female coming in for follow up.  Review of Systems  Constitutional: Negative.   HENT: Negative.    Eyes: Negative.   Respiratory:  Negative for cough, chest tightness and shortness of breath.   Cardiovascular:  Positive for chest pain. Negative for palpitations and leg swelling.  Gastrointestinal:  Negative for abdominal distention, abdominal pain, constipation, diarrhea, nausea and vomiting.  Genitourinary:  Positive for enuresis.  Musculoskeletal: Negative.   Skin: Negative.   Neurological: Negative.   Psychiatric/Behavioral: Negative.      Objective:  Physical Exam Constitutional:      Appearance: She is well-developed.  HENT:     Head: Normocephalic and atraumatic.  Cardiovascular:     Rate and Rhythm: Normal rate and regular rhythm.  Pulmonary:     Effort: Pulmonary effort is normal. No respiratory distress.     Breath sounds: Normal breath sounds. No wheezing or rales.  Abdominal:     General: Bowel sounds are normal. There is no distension.     Palpations: Abdomen is soft.     Tenderness: There is no abdominal tenderness. There is no rebound.  Musculoskeletal:     Cervical back: Normal range of motion.  Skin:    General: Skin is warm and dry.  Neurological:     Mental Status: She is alert and oriented to person, place, and time.     Coordination: Coordination normal.     Vitals:   07/22/22 0922 07/22/22 0929  BP: (!) 160/80 (!) 140/80  Pulse: (!) 57   SpO2: 98%   Weight: 126 lb (57.2 kg)   Height: '5\' 6"'$  (1.676 m)    Visit time 25 minutes in face to face communication with patient and coordination of care, additional 5 minutes spent in record review, coordination or care, ordering tests, communicating/referring  to other healthcare professionals, documenting in medical records all on the same day of the visit for total time 30 minutes spent on the visit.    Assessment & Plan:  Flu shot given at visit

## 2022-07-22 NOTE — Assessment & Plan Note (Signed)
Overall stable. Refilled lidocaine patches for left breast pain. Advised to try tums/maalox for central stomach irritation after eating. She has recently seen cardiology and they do not feel this is cardiac in nature.

## 2022-07-22 NOTE — Patient Instructions (Addendum)
We have given you the flu shot today. Try tums or maalox for the chest pain in the middle of the chest.  For the BP cuff you can call. [facility]): [address] [place], [place] [address] [phone]  They offer free delivery to anywhere in [place]

## 2022-07-26 ENCOUNTER — Telehealth: Payer: Self-pay | Admitting: Internal Medicine

## 2022-07-26 NOTE — Telephone Encounter (Signed)
Call placed to patient but phone is busy.  Will continue to try to reach patient

## 2022-07-26 NOTE — Telephone Encounter (Signed)
Pt c/o of Chest Pain: STAT if CP now or developed within 24 hours  1. Are you having CP right now? no  2. Are you experiencing any other symptoms (ex. SOB, nausea, vomiting, sweating)? no  3. How long have you been experiencing CP? 2 weeks  4. Is your CP continuous or coming and going? Comes and goes  5. Have you taken Nitroglycerin? yes ?

## 2022-07-26 NOTE — Telephone Encounter (Signed)
I spoke with patient. She reports pain on left side of chest, left breast area and left arm.  Happens with exertion and at rest. Same type pain she was having when she saw B Bhagat, PA recently.  Not having pain at current time. Saw PCP last week and Maalox and TUMS was recommended.  She is having trouble finding Maalox and has only taken TUMS a couple of times.  She will continue to take TUMS and reach out to PCP for other possible recommendations.

## 2022-08-01 NOTE — Progress Notes (Signed)
Office Visit    Patient Name: Monique [name] Date of Encounter: 08/02/2022  Primary Care Provider:  Hoyt Koch, MD Primary Cardiologist:  Dorris Carnes, MD Primary Electrophysiologist: None  Chief Complaint   Monique [name] is a 86 y.o. female with PMH of HOCM, HTN, PAF, bilateral PE, DVT, HLD, and CKD stage III presents for complaint of tightness behind left breast.   Past Medical History    Past Medical History:  Diagnosis Date   Abdominal pain 11/18/2015   Acute respiratory failure with hypoxia ([place]) 06/27/2014   Breast pain, left 10/11/2016   Cerebral aneurysm 2000 06/27/2014   Chest pain 10/30/2014   CHF (congestive heart failure) ([place])    Chronic anticoagulation 06/2014   Coumadin started after bilateral PE   Chronic kidney disease, stage III (moderate) ([place]) 11/21/2014   Clotting disorder ([place])    right was removed, still have left cataract   Diabetes ([place])    Dizziness and giddiness 11/28/2014   DVT of lower extremity (deep venous thrombosis) ([place]) 11/28/2014   Dysuria 07/07/2014   Elevated troponin 11/28/2014   Fatty liver    Gastritis 05/16/2012   Chronic dyspepsia on PPI daily.    Headache, acute 06/08/2010   Qualifier: Diagnosis of  By: Linda Hedges MD, Heinz Knuckles    Heart murmur    Hematochezia 11/18/2015   History of fibromuscular dysplasia 06/27/2014   HTN (hypertension)    Essential   Hyperlipidemia    Hypertrophic obstructive cardiomyopathy (HOCM) ([place]) 05/20/2008   CMR 4/22: EF 72, small amount of patchy LGE in basal septum c/w HCM, asymmetric basal septal hypertrophy (15 mm) c/w HCM, no ischemia, ascending aorta 38 mm   Kidney stones    LVH (left ventricular hypertrophy)    Orthostatic hypotension 11/28/2014   PAF (paroxysmal atrial fibrillation) ([place]) 2000   Palpitations 11/28/2014   PAROXYSMAL ATRIAL FIBRILLATION 05/20/2008   Qualifier: Diagnosis of  By: Linda Hedges MD, Heinz Knuckles    PE (pulmonary embolism) 06/2014   Bilateral   Postmenopausal  osteoporosis 04/2019   T score -3.3   Pulmonary embolism, bilateral ([place]) 06/27/2014   SOB (shortness of breath) 06/27/2014   Subarachnoid hemorrhage ([place]) 2000   UTI (urinary tract infection) 01/04/2015   Past Surgical History:  Procedure Laterality Date   ABDOMINAL HYSTERECTOMY  2000   Brain hemorrage     2000   CARDIAC CATHETERIZATION  05/2008   Nl cors, no gradient, EF 60%    Allergies  No Known Allergies  History of Present Illness    Monique [name] is a 86 y.o. female with PMH of HOCM, HTN, PAF, bilateral PE, DVT, HLD, and CKD stage III presents for complaint of tightness behind left breast.  She was last seen by Dr. Harrington Challenger on 3/23 and had complaints of intermittent chest pain and sensation of feeling heartbeat.  This was thought to be not cardiac in origin and patient was advised to try looser undergarments.  Patient contacted office on 3/29 with continued complaints and presents today for follow-up.  She was seen by me on 12/2021 with complaint of left-sided chest pain that was located midsternal region and was tender on palpation.Marland Kitchen  She was sent for cardiac CTA that resulted in a calcium score of 0 with normal coronaries.  She was seen in follow-up by Dr. Harrington Challenger on 02/2022 and endorsed twinges of pain with no significant palpitations.  Monique [name] presents today for complaint of left-sided chest pain and pressure along.  Since  last being seen in the office patient reports that her pain occurs usually in the center of the chest and travels to under her left arm.  She also endorses belching and increased pressure when lying flat.  She reports that the other night pain was increased after eating some cabbage.  She was prescribed pantoprazole however she reports not taking this medication currently.  She also reports occasional palpitations with chest pain.  Her blood pressures today were elevated initially at 150/78 and were still elevated at 142/78.  She denied any chest discomfort during our  visit today.  Patient denies palpitations, dyspnea, PND, orthopnea, nausea, vomiting, dizziness, syncope, edema, weight gain, or early satiety.  Home Medications    Current Outpatient Medications  Medication Sig Dispense Refill   acetaminophen (TYLENOL) 500 MG tablet Take 1,000 mg by mouth every 6 (six) hours as needed for moderate pain or headache.     amLODipine (NORVASC) 5 MG tablet TAKE 1 TABLET BY MOUTH TWICE A DAY 180 tablet 3   atorvastatin (LIPITOR) 10 MG tablet Take 1 tablet (10 mg total) by mouth daily. 90 tablet 3   atorvastatin (LIPITOR) 20 MG tablet Take 1 tablet by mouth every other day.     disopyramide (NORPACE) 150 MG capsule Take 1 capsule (150 mg total) by mouth 2 (two) times daily. Appointment needed for future refills. Thank you. 180 capsule 1   isosorbide mononitrate (IMDUR) 30 MG 24 hr tablet Take 0.5 tablets (15 mg total) by mouth daily. 90 tablet 1   lidocaine (LIDODERM) 5 % Place 1 patch onto the skin daily. 30 patch 11   metFORMIN (GLUCOPHAGE) 500 MG tablet TAKE 1 TABLET BY MOUTH EVERY DAY WITH BREAKFAST 90 tablet 0   metoprolol tartrate (LOPRESSOR) 50 MG tablet Take 1 tablet (50 mg total) by mouth 2 (two) times daily. 180 tablet 3   mirtazapine (REMERON) 15 MG tablet Take 1 tablet (15 mg total) by mouth at bedtime. 90 tablet 3   nitroGLYCERIN (NITROSTAT) 0.4 MG SL tablet Place 1 tablet (0.4 mg total) under the tongue every 5 (five) minutes as needed for chest pain. 75 tablet 1   pantoprazole (PROTONIX) 40 MG tablet Take 1 tablet (40 mg total) by mouth daily. 90 tablet 3   Rivaroxaban (XARELTO) 15 MG TABS tablet Take 15 mg by mouth daily at 6 (six) AM.     No current facility-administered medications for this visit.     Review of Systems  Please see the history of present illness.    (+) Chest pressure under left breast (+) Indigestion  All other systems reviewed and are otherwise negative except as noted above.  Physical Exam    Wt Readings from Last 3  Encounters:  08/02/22 128 lb (58.1 kg)  07/22/22 126 lb (57.2 kg)  06/17/22 127 lb (57.6 kg)   VS: Vitals:   08/02/22 1007 08/02/22 1030  BP: (!) 150/78 (!) 142/78  Pulse: (!) 52   SpO2: 98%   ,Body mass index is 20.66 kg/m.  Constitutional:      Appearance: Healthy appearance. Not in distress.  Neck:     Vascular: JVD normal.  Pulmonary:     Effort: Pulmonary effort is normal.     Breath sounds: No wheezing. No rales. Diminished in the bases Cardiovascular:     Normal rate. Regular rhythm. Normal S1. Normal S2.      Murmurs: There is no murmur.  Edema:    Peripheral edema absent.  Abdominal:  Palpations: Abdomen is soft non tender. There is no hepatomegaly.  Skin:    General: Skin is warm and dry.  Neurological:     General: No focal deficit present.     Mental Status: Alert and oriented to person, place and time.     Cranial Nerves: Cranial nerves are intact.  EKG/LABS/Other Studies Reviewed    ECG personally reviewed by me today -none completed today      Lab Results  Component Value Date   CREATININE 1.31 (H) 05/06/2022   BUN 30 (H) 05/06/2022   NA 142 05/06/2022   K 4.5 05/06/2022   CL 106 05/06/2022   CO2 30 05/06/2022   Lab Results  Component Value Date   ALT 12 05/06/2022   AST 24 05/06/2022   ALKPHOS 50 05/06/2022   BILITOT 0.5 05/06/2022   Lab Results  Component Value Date   CHOL 125 11/04/2021   HDL 60.10 11/04/2021   LDLCALC 53 11/04/2021   TRIG 60.0 11/04/2021   CHOLHDL 2 11/04/2021    Lab Results  Component Value Date   HGBA1C 6.9 (H) 05/06/2022    Assessment & Plan    1.  Atypical chest pain: -Patient's current pain described as tightness behind left breast.  She describes the pain as bothersome and comes and goes without any particular activity. -Patient underwent cardiac CTA that revealed calcium score of 0 and no evidence of coronary artery disease  -She reports her pain is associated with belching and occurs with meals  that are rich and spicy. -She has had a full cardiac workup that has revealed no cardiac findings. -We will add Imdur 15 mg daily today -Due to indigestion and belching with pain we will refer to gastroenterology for further workup  2.  Hypertension: -HYPERTENSION CONTROL Vitals:   08/02/22 1007 08/02/22 1030  BP: (!) 150/78 (!) 142/78    The patient's blood pressure is elevated above target today.  In order to address the patient's elevated BP: Blood pressure will be monitored at home to determine if medication changes need to be made.; Follow up with general cardiology has been recommended.      -Continue Lopressor 50 mg twice daily -Continue amlodipine 5 mg twice daily -Patient advised to continue monitoring blood pressures and will report pressures greater than 130/80 back to office.   3. HOCM: -Cardiac MRI completed 12/2020 with report of less than 1% total myocardial mass and basal septum -Continue optimal blood pressure control with Lopressor 50 mg twice daily, amlodipine 5 mg twice daily   4.  History of P.E. -Continue Xarelto 15 mg   5.  PSVT: -No complaints of palpitations Continue Norpace 150 mg daily   6. Hyperlipidemia: -Last LDL was 54 at goal -Continue atorvastatin 20 mg daily  Disposition: Follow-up with Dorris Carnes, MD or APP in 3 months   Medication Adjustments/Labs and Tests Ordered: Current medicines are reviewed at length with the patient today.  Concerns regarding medicines are outlined above.   Signed, Mable Fill, Marissa Nestle, NP 08/02/2022, 12:05 PM [facility]  Note:  This document was prepared using Dragon voice recognition software and may include unintentional dictation errors.

## 2022-08-02 ENCOUNTER — Ambulatory Visit: Payer: Medicare Other | Attending: Nurse Practitioner | Admitting: Nurse Practitioner

## 2022-08-02 ENCOUNTER — Encounter: Payer: Self-pay | Admitting: Nurse Practitioner

## 2022-08-02 VITALS — BP 142/78 | HR 52 | Ht 66.0 in | Wt 128.0 lb

## 2022-08-02 DIAGNOSIS — I2699 Other pulmonary embolism without acute cor pulmonale: Secondary | ICD-10-CM

## 2022-08-02 DIAGNOSIS — I421 Obstructive hypertrophic cardiomyopathy: Secondary | ICD-10-CM | POA: Diagnosis not present

## 2022-08-02 DIAGNOSIS — R072 Precordial pain: Secondary | ICD-10-CM | POA: Diagnosis not present

## 2022-08-02 DIAGNOSIS — E1169 Type 2 diabetes mellitus with other specified complication: Secondary | ICD-10-CM | POA: Diagnosis not present

## 2022-08-02 DIAGNOSIS — E785 Hyperlipidemia, unspecified: Secondary | ICD-10-CM

## 2022-08-02 DIAGNOSIS — I471 Supraventricular tachycardia, unspecified: Secondary | ICD-10-CM | POA: Diagnosis not present

## 2022-08-02 DIAGNOSIS — K219 Gastro-esophageal reflux disease without esophagitis: Secondary | ICD-10-CM | POA: Diagnosis not present

## 2022-08-02 MED ORDER — ISOSORBIDE MONONITRATE ER 30 MG PO TB24: 15.0000 mg | ORAL_TABLET | Freq: Every day | ORAL | 1 refills | Status: DC

## 2022-08-02 NOTE — Patient Instructions (Addendum)
Medication Instructions:  DISCONTINUE Pantoprazole  START Imdur '15mg'$  Take 1 tablet daily ( Tablet comes in '30mg'$  break tablet in half and take half daily)  Go to your local pharmacy and get Maalox, Tums or Rolaids to help with acid reflux *If you need a refill on your cardiac medications before your next appointment, please call your pharmacy*  Lab Work: None Ordered  Testing/Procedures: None ordered  Follow-Up: At [facility], you and your health needs are our priority.  As part of our continuing mission to provide you with exceptional heart care, we have created designated Provider Care Teams.  These Care Teams include your primary Cardiologist (physician) and Advanced Practice Providers (APPs -  Physician Assistants and Nurse Practitioners) who all work together to provide you with the care you need, when you need it.  We recommend signing up for the patient portal called "MyChart".  Sign up information is provided on this After Visit Summary.  MyChart is used to connect with patients for Virtual Visits (Telemedicine).  Patients are able to view lab/test results, encounter notes, upcoming appointments, etc.  Non-urgent messages can be sent to your provider as well.   To learn more about what you can do with MyChart, go to [email or web address].    Your next appointment:   3 month(s)  The format for your next appointment:   In Person  Provider:   Ambrose Pancoast, NP      Other Instructions You have been referred to Gastroenterologist with Dr Valda Lamb Check your blood pressure daily for 1-2 weeks and contact the office with your readings  Important Information About Sugar

## 2022-08-18 NOTE — Telephone Encounter (Signed)
Forwarding to RX Prior Auth Team 

## 2022-08-19 ENCOUNTER — Other Ambulatory Visit (HOSPITAL_COMMUNITY): Payer: Self-pay

## 2022-08-24 ENCOUNTER — Other Ambulatory Visit: Payer: Self-pay | Admitting: Internal Medicine

## 2022-08-24 DIAGNOSIS — I2699 Other pulmonary embolism without acute cor pulmonale: Secondary | ICD-10-CM

## 2022-08-25 NOTE — Telephone Encounter (Signed)
Prescription refill request for Xarelto received.  Indication: P.E. Last office visit: 08/02/22 Barbarann Ehlers)  Weight: 58.1kg Age: 86 Scr: 1.31 (05/06/22)  CrCl: 29.26m/min  Appropriate dose and refill sent to requested pharmacy.

## 2022-08-28 ENCOUNTER — Other Ambulatory Visit: Payer: Self-pay | Admitting: Internal Medicine

## 2022-09-30 ENCOUNTER — Telehealth: Payer: Self-pay

## 2022-09-30 NOTE — Telephone Encounter (Signed)
This has been signed and faxed back 

## 2022-10-07 DIAGNOSIS — N644 Mastodynia: Secondary | ICD-10-CM | POA: Diagnosis not present

## 2022-10-07 LAB — HM MAMMOGRAPHY

## 2022-10-10 ENCOUNTER — Encounter: Payer: Self-pay | Admitting: Internal Medicine

## 2022-10-11 ENCOUNTER — Encounter: Payer: Self-pay | Admitting: Internal Medicine

## 2022-10-17 ENCOUNTER — Other Ambulatory Visit: Payer: Self-pay | Admitting: Internal Medicine

## 2022-10-27 NOTE — Progress Notes (Signed)
Office Visit    Patient Name: Monique [name] Date of Encounter: 10/28/2022  Primary Care Provider:  Hoyt Koch, MD Primary Cardiologist:  Dorris Carnes, MD Primary Electrophysiologist: None  Chief Complaint    Monique [name] is a 87 y.o. female with PMH of HOCM, HTN, PAF, bilateral PE, DVT, HLD, and CKD stage III presents for complaint of tightness behind left breast.   Past Medical History    Past Medical History:  Diagnosis Date   Abdominal pain 11/18/2015   Acute respiratory failure with hypoxia ([place]) 06/27/2014   Breast pain, left 10/11/2016   Cerebral aneurysm 2000 06/27/2014   Chest pain 10/30/2014   CHF (congestive heart failure) ([place])    Chronic anticoagulation 06/2014   Coumadin started after bilateral PE   Chronic kidney disease, stage III (moderate) ([place]) 11/21/2014   Clotting disorder ([place])    right was removed, still have left cataract   Diabetes ([place])    Dizziness and giddiness 11/28/2014   DVT of lower extremity (deep venous thrombosis) ([place]) 11/28/2014   Dysuria 07/07/2014   Elevated troponin 11/28/2014   Fatty liver    Gastritis 05/16/2012   Chronic dyspepsia on PPI daily.    Headache, acute 06/08/2010   Qualifier: Diagnosis of  By: Linda Hedges MD, Heinz Knuckles    Heart murmur    Hematochezia 11/18/2015   History of fibromuscular dysplasia 06/27/2014   HTN (hypertension)    Essential   Hyperlipidemia    Hypertrophic obstructive cardiomyopathy (HOCM) ([place]) 05/20/2008   CMR 4/22: EF 72, small amount of patchy LGE in basal septum c/w HCM, asymmetric basal septal hypertrophy (15 mm) c/w HCM, no ischemia, ascending aorta 38 mm   Kidney stones    LVH (left ventricular hypertrophy)    Orthostatic hypotension 11/28/2014   PAF (paroxysmal atrial fibrillation) ([place]) 2000   Palpitations 11/28/2014   PAROXYSMAL ATRIAL FIBRILLATION 05/20/2008   Qualifier: Diagnosis of  By: Linda Hedges MD, Heinz Knuckles    PE (pulmonary embolism) 06/2014   Bilateral   Postmenopausal  osteoporosis 04/2019   T score -3.3   Pulmonary embolism, bilateral ([place]) 06/27/2014   SOB (shortness of breath) 06/27/2014   Subarachnoid hemorrhage ([place]) 2000   UTI (urinary tract infection) 01/04/2015   Past Surgical History:  Procedure Laterality Date   ABDOMINAL HYSTERECTOMY  2000   Brain hemorrage     2000   CARDIAC CATHETERIZATION  05/2008   Nl cors, no gradient, EF 60%    Allergies  No Known Allergies  History of Present Illness    Monique [name] is a 87 y.o. female with PMH of HOCM, HTN, PAF, bilateral PE, DVT, HLD, and CKD stage III presents for complaint of tightness behind left breast.  She was last seen by Dr. Harrington Challenger on 3/23 and had complaints of intermittent chest pain and sensation of feeling heartbeat.  This was thought to be not cardiac in origin and patient was advised to try looser undergarments.  Patient contacted office on 3/29 with continued complaints and presents today for follow-up.  She was seen by me on 12/2021 with complaint of left-sided chest pain that was located midsternal region and was tender on palpation.Marland Kitchen  She was sent for cardiac CTA that resulted in a calcium score of 0 with normal coronaries.  She was seen in follow-up by Dr. Harrington Challenger on 02/2022 and endorsed twinges of pain with no significant palpitations. She was seen last on 08/02/2022 for complaint of chest discomfort that was associated with  belching. She also noted increased pressure when lying flat. Imdur 15 mg was added at that time and she was provided a referral.      Monique [name] presents today for 67-monthfollow-up alone.  Since last being seen in the office patient reports she has been doing well but did experience 1 episode last Thursday of chest pain that was musculoskeletal in nature and lasted throughout the day.  She reports that it improved with a lidocaine patch.  She denied any changes in her diet or spicy foods.  Her blood pressure today was initially elevated at 150/86 and was 142/78 on  recheck.  She is compliant with her current medications and denies any adverse reactions.  She has been trying to gain weight and is currently not eating a lot of sweets.  I encouraged her to try eating Ensure shakes with her meals and in between meals to help and gaining weight.  She is not having any intentional weight loss but has lost weight since being diagnosed with diabetes. We discussed following up with her GI doctor if she continues to have more weight loss Patient denies chest pain, palpitations, dyspnea, PND, orthopnea, nausea, vomiting, dizziness, syncope, edema, weight gain, or early satiety.   Home Medications    Current Outpatient Medications  Medication Sig Dispense Refill   acetaminophen (TYLENOL) 500 MG tablet Take 1,000 mg by mouth every 6 (six) hours as needed for moderate pain or headache.     amLODipine (NORVASC) 5 MG tablet TAKE 1 TABLET BY MOUTH TWICE A DAY 180 tablet 3   atorvastatin (LIPITOR) 10 MG tablet Take 1 tablet (10 mg total) by mouth daily. 90 tablet 3   disopyramide (NORPACE) 150 MG capsule Take 1 capsule (150 mg total) by mouth 2 (two) times daily. Appointment needed for future refills. Thank you. 180 capsule 1   isosorbide mononitrate (IMDUR) 30 MG 24 hr tablet Take 0.5 tablets (15 mg total) by mouth daily. 90 tablet 1   lidocaine (LIDODERM) 5 % Place 1 patch onto the skin daily. 30 patch 11   metFORMIN (GLUCOPHAGE) 500 MG tablet TAKE 1 TABLET BY MOUTH EVERY DAY WITH BREAKFAST 90 tablet 0   metoprolol tartrate (LOPRESSOR) 50 MG tablet Take 1 tablet (50 mg total) by mouth 2 (two) times daily. 180 tablet 3   mirtazapine (REMERON) 15 MG tablet Take 1 tablet (15 mg total) by mouth at bedtime. 90 tablet 3   nitroGLYCERIN (NITROSTAT) 0.4 MG SL tablet Place 1 tablet (0.4 mg total) under the tongue every 5 (five) minutes as needed for chest pain. 75 tablet 1   pantoprazole (PROTONIX) 40 MG tablet Take 1 tablet (40 mg total) by mouth daily. 90 tablet 3   XARELTO 15 MG  TABS tablet TAKE 1 TABLET BY MOUTH EVERY DAY WITH SUPPER 30 tablet 5   No current facility-administered medications for this visit.     Review of Systems  Please see the history of present illness.    (+) Musculoskeletal chest pain (+) Chronic lower left extremity swelling  All other systems reviewed and are otherwise negative except as noted above.  Physical Exam    Wt Readings from Last 3 Encounters:  10/28/22 125 lb (56.7 kg)  08/02/22 128 lb (58.1 kg)  07/22/22 126 lb (57.2 kg)   VS: Vitals:   10/28/22 0806  BP: (!) 150/86  Pulse: (!) 58  SpO2: 98%  ,Body mass index is 20.18 kg/m.  Constitutional:      Appearance: Healthy  appearance. Not in distress.  Neck:     Vascular: JVD normal.  Pulmonary:     Effort: Pulmonary effort is normal.     Breath sounds: No wheezing. No rales. Diminished in the bases Cardiovascular:     Normal rate. Regular rhythm. Normal S1. Normal S2.      Murmurs: There is no murmur.  Edema:    Peripheral edema absent.  Abdominal:     Palpations: Abdomen is soft non tender. There is no hepatomegaly.  Skin:    General: Skin is warm and dry.  Neurological:     General: No focal deficit present.     Mental Status: Alert and oriented to person, place and time.     Cranial Nerves: Cranial nerves are intact.  EKG/LABS/Other Studies Reviewed    ECG personally reviewed by me today -none completed today   Lab Results  Component Value Date   WBC 7.4 11/04/2021   HGB 13.0 11/04/2021   HCT 39.3 11/04/2021   MCV 93.9 11/04/2021   PLT 188.0 11/04/2021   Lab Results  Component Value Date   CREATININE 1.31 (H) 05/06/2022   BUN 30 (H) 05/06/2022   NA 142 05/06/2022   K 4.5 05/06/2022   CL 106 05/06/2022   CO2 30 05/06/2022   Lab Results  Component Value Date   ALT 12 05/06/2022   AST 24 05/06/2022   ALKPHOS 50 05/06/2022   BILITOT 0.5 05/06/2022   Lab Results  Component Value Date   CHOL 125 11/04/2021   HDL 60.10 11/04/2021    LDLCALC 53 11/04/2021   TRIG 60.0 11/04/2021   CHOLHDL 2 11/04/2021    Lab Results  Component Value Date   HGBA1C 6.9 (H) 05/06/2022    Assessment & Plan    1.  Atypical chest pain: -She reports today occurrence of chest discomfort that was musculoskeletal and relieved with lidocaine patch. -We we will add Imdur 50 mg as needed in addition to her current 50 mg daily if chest pain reoccurs and is not relieved with ice heat and lidocaine patch.  2.  Hypertension:  -Patient's blood pressure today was initially elevated at 150/86 and was 142/78 on recheck. -She has osteoporosis and we will discontinue amlodipine and start her on HCTZ 12.5 mg daily -Check BMET in 2 weeks -Continue metoprolol 50 mg twice daily   3. HOCM: -Cardiac MRI completed 12/2020 with report of less than 1% total myocardial mass and basal septum -Continue optimal blood pressure control with Lopressor 50 mg twice and HCTZ 12.5 mg daily   4.  History of P.E. -Continue Xarelto 15 mg   5.  PSVT: -No complaints of palpitations Continue Norpace 150 mg daily   6. Hyperlipidemia: -Last LDL was 54 at goal -Continue atorvastatin 20 mg daily  Disposition: Follow-up with Dorris Carnes, MD or APP in 6 months    Medication Adjustments/Labs and Tests Ordered: Current medicines are reviewed at length with the patient today.  Concerns regarding medicines are outlined above.   Signed, Mable Fill, Marissa Nestle, NP 10/28/2022, 8:22 AM [facility]  Note:  This document was prepared using Dragon voice recognition software and may include unintentional dictation errors.

## 2022-10-28 ENCOUNTER — Ambulatory Visit: Payer: Medicare Other | Attending: Nurse Practitioner | Admitting: Nurse Practitioner

## 2022-10-28 ENCOUNTER — Encounter: Payer: Self-pay | Admitting: Nurse Practitioner

## 2022-10-28 VITALS — BP 142/78 | HR 58 | Ht 66.0 in | Wt 125.0 lb

## 2022-10-28 DIAGNOSIS — I421 Obstructive hypertrophic cardiomyopathy: Secondary | ICD-10-CM | POA: Diagnosis not present

## 2022-10-28 DIAGNOSIS — I2699 Other pulmonary embolism without acute cor pulmonale: Secondary | ICD-10-CM

## 2022-10-28 DIAGNOSIS — R072 Precordial pain: Secondary | ICD-10-CM | POA: Diagnosis not present

## 2022-10-28 DIAGNOSIS — I471 Supraventricular tachycardia, unspecified: Secondary | ICD-10-CM | POA: Diagnosis not present

## 2022-10-28 DIAGNOSIS — I10 Essential (primary) hypertension: Secondary | ICD-10-CM

## 2022-10-28 MED ORDER — HYDROCHLOROTHIAZIDE 12.5 MG PO CAPS: 12.5000 mg | ORAL_CAPSULE | Freq: Every day | ORAL | 3 refills | Status: DC

## 2022-10-28 MED ORDER — ISOSORBIDE MONONITRATE ER 30 MG PO TB24: 15.0000 mg | ORAL_TABLET | Freq: Every day | ORAL | 1 refills | Status: DC

## 2022-10-28 NOTE — Patient Instructions (Signed)
Medication Instructions:  STOP Norvasc START Hydrochlorothiazide 12.'5mg'$  Take 1 tablet once a day CAN take an additional half tablet of Imdur for chest pain as needed *If you need a refill on your cardiac medications before your next appointment, please call your pharmacy*   Lab Work: BMET in [place] If you have labs (blood work) drawn today and your tests are completely normal, you will receive your results only by: [place] (if you have MyChart) OR A paper copy in the mail If you have any lab test that is abnormal or we need to change your treatment, we will call you to review the results.   Testing/Procedures: None ordered   Follow-Up: At [facility], you and your health needs are our priority.  As part of our continuing mission to provide you with exceptional heart care, we have created designated Provider Care Teams.  These Care Teams include your primary Cardiologist (physician) and Advanced Practice Providers (APPs -  Physician Assistants and Nurse Practitioners) who all work together to provide you with the care you need, when you need it.  We recommend signing up for the patient portal called "MyChart".  Sign up information is provided on this After Visit Summary.  MyChart is used to connect with patients for Virtual Visits (Telemedicine).  Patients are able to view lab/test results, encounter notes, upcoming appointments, etc.  Non-urgent messages can be sent to your provider as well.   To learn more about what you can do with MyChart, go to [email or web address].    Your next appointment:   6 month(s)  Provider:   Dorris Carnes, MD     Other Instructions Check your Blood pressure daily for one week and then contact the office with your readings.

## 2022-11-11 ENCOUNTER — Ambulatory Visit: Payer: Medicare Other | Attending: Nurse Practitioner

## 2022-11-11 DIAGNOSIS — I10 Essential (primary) hypertension: Secondary | ICD-10-CM | POA: Diagnosis not present

## 2022-11-12 LAB — BASIC METABOLIC PANEL
BUN/Creatinine Ratio: 23 (ref 12–28)
BUN: 35 mg/dL — ABNORMAL HIGH (ref 8–27)
CO2: 26 mmol/L (ref 20–29)
Calcium: 9.8 mg/dL (ref 8.7–10.3)
Chloride: 102 mmol/L (ref 96–106)
Creatinine, Ser: 1.53 mg/dL — ABNORMAL HIGH (ref 0.57–1.00)
Glucose: 141 mg/dL — ABNORMAL HIGH (ref 70–99)
Potassium: 3.5 mmol/L (ref 3.5–5.2)
Sodium: 144 mmol/L (ref 134–144)
eGFR: 33 mL/min/{1.73_m2} — ABNORMAL LOW (ref >59)

## 2022-11-18 ENCOUNTER — Encounter: Payer: Self-pay | Admitting: Internal Medicine

## 2022-11-18 ENCOUNTER — Ambulatory Visit (INDEPENDENT_AMBULATORY_CARE_PROVIDER_SITE_OTHER): Payer: Medicare Other | Admitting: Internal Medicine

## 2022-11-18 VITALS — BP 158/98 | HR 54 | Temp 97.9°F | Ht 66.0 in | Wt 126.0 lb

## 2022-11-18 DIAGNOSIS — E1169 Type 2 diabetes mellitus with other specified complication: Secondary | ICD-10-CM | POA: Diagnosis not present

## 2022-11-18 DIAGNOSIS — I10 Essential (primary) hypertension: Secondary | ICD-10-CM

## 2022-11-18 DIAGNOSIS — I671 Cerebral aneurysm, nonruptured: Secondary | ICD-10-CM | POA: Diagnosis not present

## 2022-11-18 DIAGNOSIS — E118 Type 2 diabetes mellitus with unspecified complications: Secondary | ICD-10-CM | POA: Diagnosis not present

## 2022-11-18 DIAGNOSIS — E785 Hyperlipidemia, unspecified: Secondary | ICD-10-CM | POA: Diagnosis not present

## 2022-11-18 DIAGNOSIS — R519 Headache, unspecified: Secondary | ICD-10-CM | POA: Diagnosis not present

## 2022-11-18 DIAGNOSIS — N1832 Chronic kidney disease, stage 3b: Secondary | ICD-10-CM | POA: Diagnosis not present

## 2022-11-18 LAB — COMPREHENSIVE METABOLIC PANEL
ALT: 15 U/L (ref 0–35)
AST: 30 U/L (ref 0–37)
Albumin: 3.9 g/dL (ref 3.5–5.2)
Alkaline Phosphatase: 72 U/L (ref 39–117)
BUN: 28 mg/dL — ABNORMAL HIGH (ref 6–23)
CO2: 32 mEq/L (ref 19–32)
Calcium: 9.8 mg/dL (ref 8.4–10.5)
Chloride: 102 mEq/L (ref 96–112)
Creatinine, Ser: 1.44 mg/dL — ABNORMAL HIGH (ref 0.40–1.20)
GFR: 33.36 mL/min — ABNORMAL LOW (ref >60.00)
Glucose, Bld: 129 mg/dL — ABNORMAL HIGH (ref 70–99)
Potassium: 4.1 mEq/L (ref 3.5–5.1)
Sodium: 142 mEq/L (ref 135–145)
Total Bilirubin: 0.6 mg/dL (ref 0.2–1.2)
Total Protein: 7.1 g/dL (ref 6.0–8.3)

## 2022-11-18 LAB — CBC
HCT: 42.9 % (ref 36.0–46.0)
Hemoglobin: 14.2 g/dL (ref 12.0–15.0)
MCHC: 33.1 g/dL (ref 30.0–36.0)
MCV: 96.2 fl (ref 78.0–100.0)
Platelets: 170 10*3/uL (ref 150.0–400.0)
RBC: 4.46 Mil/uL (ref 3.87–5.11)
RDW: 14 % (ref 11.5–15.5)
WBC: 7.2 10*3/uL (ref 4.0–10.5)

## 2022-11-18 LAB — LIPID PANEL
Cholesterol: 138 mg/dL (ref 0–200)
HDL: 70.2 mg/dL (ref >39.00)
LDL Cholesterol: 51 mg/dL (ref 0–99)
NonHDL: 68.04
Total CHOL/HDL Ratio: 2
Triglycerides: 83 mg/dL (ref 0.0–149.0)
VLDL: 16.6 mg/dL (ref 0.0–40.0)

## 2022-11-18 LAB — MICROALBUMIN / CREATININE URINE RATIO
Creatinine,U: 93.3 mg/dL
Microalb Creat Ratio: 1.7 mg/g (ref 0.0–30.0)
Microalb, Ur: 1.6 mg/dL (ref 0.0–1.9)

## 2022-11-18 LAB — HEMOGLOBIN A1C: Hgb A1c MFr Bld: 6.8 % — ABNORMAL HIGH (ref 4.6–6.5)

## 2022-11-18 NOTE — Assessment & Plan Note (Signed)
Persistent recently with off and of for some time. She had prior aneurysm with hemorrhage. No stroke like symptoms on exam or described. Checking CT head to rule out changes. She will take tylenol for pain as this is effective.

## 2022-11-18 NOTE — Assessment & Plan Note (Signed)
Checking HgA1c, foot exam done, microalbumin to creatinine ratio and CMP and CBC and lipid panel. She is taking metformin 500 mg daily and is on statin. Not on ACE-I/ARB. Adjust as needed for goal Hga1c<8.

## 2022-11-18 NOTE — Assessment & Plan Note (Signed)
Checking CMP and adjust as needed. She knows to avoid NSAIDs.

## 2022-11-18 NOTE — Progress Notes (Signed)
   Subjective:   Patient ID: Monique [name], female    DOB: 1936/01/02, 87 y.o.   MRN: [id number]  HPI The patient is an 87 YO female coming in for headaches and follow up.  Review of Systems  Constitutional: Negative.   HENT: Negative.    Eyes: Negative.   Respiratory:  Negative for cough, chest tightness and shortness of breath.   Cardiovascular:  Negative for chest pain, palpitations and leg swelling.  Gastrointestinal:  Negative for abdominal distention, abdominal pain, constipation, diarrhea, nausea and vomiting.  Musculoskeletal: Negative.   Skin: Negative.   Neurological:  Positive for headaches.  Psychiatric/Behavioral: Negative.      Objective:  Physical Exam Constitutional:      Appearance: She is well-developed.  HENT:     Head: Normocephalic and atraumatic.  Cardiovascular:     Rate and Rhythm: Normal rate and regular rhythm.  Pulmonary:     Effort: Pulmonary effort is normal. No respiratory distress.     Breath sounds: Normal breath sounds. No wheezing or rales.  Abdominal:     General: Bowel sounds are normal. There is no distension.     Palpations: Abdomen is soft.     Tenderness: There is no abdominal tenderness. There is no rebound.  Musculoskeletal:     Cervical back: Normal range of motion.  Skin:    General: Skin is warm and dry.     Comments: Foot exam done  Neurological:     Mental Status: She is alert and oriented to person, place, and time.     Coordination: Coordination normal.     Vitals:   11/18/22 0843 11/18/22 0917  BP: (!) 168/100 (!) 158/98  Pulse: (!) 54   Temp: 97.9 F (36.6 C)   TempSrc: Oral   SpO2: 97%   Weight: 126 lb (57.2 kg)   Height: 5\' 6"  (1.676 m)     Assessment & Plan:

## 2022-11-18 NOTE — Assessment & Plan Note (Signed)
BP is elevated today she has taken medications. She does not think it is running high lately. Checking CMP for changes. Keep hctz 12.5 mg daily and imdur 15 mg daily and metoprolol 50 mg BID and norpace 150 mg BID.

## 2022-11-18 NOTE — Patient Instructions (Signed)
We will check the labs today and the CT scan of the head.

## 2022-11-18 NOTE — Assessment & Plan Note (Signed)
Checking lipid panel and adjust lipitor 10 mg daily for LDL goal <100.

## 2022-11-18 NOTE — Assessment & Plan Note (Signed)
Checking CT head due to increased frequency of migraines/headaches recently. BP was elevated today.

## 2022-12-16 ENCOUNTER — Ambulatory Visit
Admission: RE | Admit: 2022-12-16 | Discharge: 2022-12-16 | Disposition: A | Discharge status: Home / Self Care | Payer: Medicare Other | Source: Ambulatory Visit | Attending: Internal Medicine | Admitting: Internal Medicine

## 2022-12-16 DIAGNOSIS — I671 Cerebral aneurysm, nonruptured: Secondary | ICD-10-CM

## 2022-12-16 DIAGNOSIS — R519 Headache, unspecified: Secondary | ICD-10-CM

## 2022-12-20 ENCOUNTER — Other Ambulatory Visit: Payer: Self-pay | Admitting: Internal Medicine

## 2022-12-22 ENCOUNTER — Other Ambulatory Visit: Payer: Self-pay | Admitting: Internal Medicine

## 2022-12-22 DIAGNOSIS — I421 Obstructive hypertrophic cardiomyopathy: Secondary | ICD-10-CM

## 2022-12-22 DIAGNOSIS — I259 Chronic ischemic heart disease, unspecified: Secondary | ICD-10-CM

## 2022-12-22 DIAGNOSIS — E782 Mixed hyperlipidemia: Secondary | ICD-10-CM

## 2022-12-29 ENCOUNTER — Telehealth: Payer: Self-pay | Admitting: Internal Medicine

## 2022-12-29 NOTE — Telephone Encounter (Signed)
Patient c/o Palpitations:  High priority if patient c/o lightheadedness, shortness of breath, or chest pain  How long have you had palpitations/irregular HR/ Afib? Are you having the symptoms now? No   Are you currently experiencing lightheadedness, SOB or CP? No   Do you have a history of afib (atrial fibrillation) or irregular heart rhythm? Yes   Have you checked your BP or HR? (document readings if available): No   Are you experiencing any other symptoms? No  Patient states that she woke up last night to rapid heart rate. She also adds that yesterday early morning around 7 A.M. she experienced a little bit of chest pain. Requesting return call.

## 2022-12-29 NOTE — Telephone Encounter (Signed)
Left a message to call back.

## 2023-01-02 NOTE — Telephone Encounter (Signed)
Attempted to call patient x3. Busy signal received. Will try again later.

## 2023-01-02 NOTE — Telephone Encounter (Signed)
Patient is returning LPN's call. Please advise. 

## 2023-01-02 NOTE — Telephone Encounter (Signed)
I spoke with the pt and she reports that she has been having continued chest pressure radiating to her left arm and has been happening randomly at rest and with exertion. She says last Wednesday her heart was racing and kept her up all night.. she denies any history of panic attacks.   She says that it is a different type of discomfort than what she was having when she was seen a couple of months ago.   Her last cardiac CT was 12/21/2021.   She says that she has been taking the Imdur 15 mg daily as opposed as PRN.   She was not sure about her BP.   I have talked to her about going to the ED for her symptoms and not to drive herself but to call EMS if she is alone.   I have also asked her to talk with her PCP and maybe make an appt with her to be assessed for any non-cardiac causes for her symptoms and she agrees.   I also made her an appt with an APP this week but asked her not to wait until her appt in case her symptoms change and/ or worsen.

## 2023-01-02 NOTE — Progress Notes (Unsigned)
Cardiology Office Note:    Date:  01/05/2023   ID:  Monique [name], DOB 12/18/1935, MRN [id number]  PCP:  Myrlene Broker, MD   [facility] Providers Cardiologist:  Dietrich Pates, MD     Referring MD: Myrlene Broker, *   Chief Complaint: chest discomfort  History of Present Illness:    Monique [name] is a pleasant 87 y.o. female with a hx of HOCM, HTN, PAF, bilateral PE/DVT on chronic anticoagulation, HLD, and CKD stage III.  History of cardiac MRI 12/2020 with LGE uptake less than 1% total myocardial mass in basal septum, no evidence of ischemia, asymmetric hypertrophy of basal septum measuring up to 15 mm (6 mm in inferolateral wall), meeting criteria for hypertrophic cardiomyopathy.  Normal LV size with hyperdynamic systolic function EF 72%, normal RV size and systolic function EF 63%.  Seen in cardiology clinic by Robin Searing, NP for tightness behind her left breast on 10/28/22. Had previously seen Alden Server 12/2021 with c/o left-sided chest pain that was located midsternal region and was tender on palpation. She underwent coronary CTA that revealed calcium score 0 with normal coronaries. Seen in follow-up by Dr. Tenny Craw on 02/2022 and reported twinges of pain with no significant palpitations. Started on Imdur 15 mg daily in 07/2022 for c/o chest discomfort associated with belching. Also noted increased pressure when lying flat.  At office visit 10/28/22, she reported an episode of chest pain that lasted throughout the day and improved with a lidocaine patch.  She reported unintentional weight loss since being diagnosed with diabetes. BP was elevated. Has hx of osteoporosis so amlodipine was discontinued and HCTZ 12.5 mg daily was added. Advised to return in 2 weeks for BMP.  Was advised she could take an additional 15 mg of Imdur for chest pain as needed.  BMP 11/11/2022 revealed slight bump in creatinine but stable potassium.  She was encouraged to ensure good hydration and reported  that her BP was well-controlled at home.  Today, she is here alone for evaluation of recent episode of palpitations. One week ago had an episode of her heart beating fast all through the night, did not sleep. Had to sit up and finally the palpitations went away on their own. Continues to have episodes of feeling her chest tighten up on occasion, at other times feels nothing.  BP is well controlled at home, was 117/70 mmHg yesterday. Notes occasional left ankle swelling.  No orthopnea, PND, weight gain, presyncope, syncope. Is not very active at home. Has been feeling well since last office visit until one week ago. No problems with her medications.   Past Medical History:  Diagnosis Date   Abdominal pain 11/18/2015   Acute respiratory failure with hypoxia (HCC) 06/27/2014   Breast pain, left 10/11/2016   Cerebral aneurysm 2000 06/27/2014   Chest pain 10/30/2014   CHF (congestive heart failure) (HCC)    Chronic anticoagulation 06/2014   Coumadin started after bilateral PE   Chronic kidney disease, stage III (moderate) (HCC) 11/21/2014   Clotting disorder (HCC)    right was removed, still have left cataract   Diabetes (HCC)    Dizziness and giddiness 11/28/2014   DVT of lower extremity (deep venous thrombosis) (HCC) 11/28/2014   Dysuria 07/07/2014   Elevated troponin 11/28/2014   Fatty liver    Gastritis 05/16/2012   Chronic dyspepsia on PPI daily.    Headache, acute 06/08/2010   Qualifier: Diagnosis of  By: Debby Bud MD, Rosalyn Gess  Heart murmur    Hematochezia 11/18/2015   History of fibromuscular dysplasia 06/27/2014   HTN (hypertension)    Essential   Hyperlipidemia    Hypertrophic obstructive cardiomyopathy (HOCM) (HCC) 05/20/2008   CMR 4/22: EF 72, small amount of patchy LGE in basal septum c/w HCM, asymmetric basal septal hypertrophy (15 mm) c/w HCM, no ischemia, ascending aorta 38 mm   Kidney stones    LVH (left ventricular hypertrophy)    Orthostatic hypotension 11/28/2014   PAF  (paroxysmal atrial fibrillation) (HCC) 2000   Palpitations 11/28/2014   PAROXYSMAL ATRIAL FIBRILLATION 05/20/2008   Qualifier: Diagnosis of  By: Debby Bud MD, Rosalyn Gess    PE (pulmonary embolism) 06/2014   Bilateral   Postmenopausal osteoporosis 04/2019   T score -3.3   Pulmonary embolism, bilateral (HCC) 06/27/2014   SOB (shortness of breath) 06/27/2014   Subarachnoid hemorrhage (HCC) 2000   UTI (urinary tract infection) 01/04/2015    Past Surgical History:  Procedure Laterality Date   ABDOMINAL HYSTERECTOMY  2000   Brain hemorrage     2000   CARDIAC CATHETERIZATION  05/2008   Nl cors, no gradient, EF 60%    Current Medications: Current Meds  Medication Sig   acetaminophen (TYLENOL) 500 MG tablet Take 1,000 mg by mouth every 6 (six) hours as needed for moderate pain or headache.   atorvastatin (LIPITOR) 10 MG tablet Take 1 tablet (10 mg total) by mouth daily.   disopyramide (NORPACE) 150 MG capsule TAKE 1 CAPSULE (150 MG TOTAL) BY MOUTH 2 (TWO) TIMES DAILY. APPOINTMENT NEEDED FOR FUTURE REFILLS. THANK YOU.   isosorbide mononitrate (IMDUR) 30 MG 24 hr tablet Take 0.5 tablets (15 mg total) by mouth daily. Can take an additional half tablet as needed for chest pain   lidocaine (LIDODERM) 5 % Place 1 patch onto the skin daily.   metFORMIN (GLUCOPHAGE) 500 MG tablet TAKE 1 TABLET BY MOUTH EVERY DAY WITH BREAKFAST   metoprolol tartrate (LOPRESSOR) 50 MG tablet Take 1 tablet (50 mg total) by mouth 2 (two) times daily. Please keep scheduled appointment with Dr. Tenny Craw for future refills. Thank you.   mirtazapine (REMERON) 15 MG tablet Take 1 tablet (15 mg total) by mouth at bedtime.   XARELTO 15 MG TABS tablet TAKE 1 TABLET BY MOUTH EVERY DAY WITH SUPPER   [DISCONTINUED] hydrochlorothiazide (MICROZIDE) 12.5 MG capsule Take 1 capsule (12.5 mg total) by mouth daily.   [DISCONTINUED] nitroGLYCERIN (NITROSTAT) 0.4 MG SL tablet Place 1 tablet (0.4 mg total) under the tongue every 5 (five) minutes as  needed for chest pain.     Allergies:   Patient has no known allergies.   Social History   Socioeconomic History   Marital status: Divorced    Spouse name: Not on file   Number of children: 4   Years of education: 12   Highest education level: Not on file  Occupational History   Occupation: Retired Doctor, general practice  Tobacco Use   Smoking status: Former    Types: Cigarettes    Quit date: 09/06/1967    Years since quitting: 55.3   Smokeless tobacco: Never  Vaping Use   Vaping Use: Never used  Substance and Sexual Activity   Alcohol use: No    Alcohol/week: 0.0 standard drinks of alcohol   Drug use: No   Sexual activity: Not Currently    Comment: 1st intercourse 87yo-Fewer than 5 partners  Other Topics Concern   Not on file  Social History Narrative   HSG. Married '  55 - '88/divorced. 3 sons - '74, '59, '62, 1 daughter '63; 5 grandchildren. 10/06/20 Lives alone in two story home.    She does not drive.   Retired from [employer].   Social Determinants of Health   Financial Resource Strain: Low Risk  (04/29/2022)   Overall Financial Resource Strain (CARDIA)    Difficulty of Paying Living Expenses: Not hard at all  Food Insecurity: No Food Insecurity (04/29/2022)   Hunger Vital Sign    Worried About Running Out of Food in the Last Year: Never true    Ran Out of Food in the Last Year: Never true  Transportation Needs: No Transportation Needs (04/29/2022)   PRAPARE - Administrator, Civil Service (Medical): No    Lack of Transportation (Non-Medical): No  Physical Activity: Sufficiently Active (04/29/2022)   Exercise Vital Sign    Days of Exercise per Week: 5 days    Minutes of Exercise per Session: 30 min  Stress: No Stress Concern Present (04/29/2022)   [employer] of Occupational Health - Occupational Stress Questionnaire    Feeling of Stress : Not at all  Social Connections: Moderately Integrated (04/29/2022)   Social Connection and  Isolation Panel [NHANES]    Frequency of Communication with Friends and Family: More than three times a week    Frequency of Social Gatherings with Friends and Family: More than three times a week    Attends Religious Services: More than 4 times per year    Active Member of [employer] or Organizations: Yes    Attends Engineer, structural: More than 4 times per year    Marital Status: Divorced     Family History: The patient's family history includes Arrhythmia in her mother and sister; Breast cancer (age of onset: 24) in her daughter; Breast cancer (age of onset: 33) in her mother; Cervical cancer in her sister; Colon cancer in her sister; Heart disease in her mother; Stroke in her mother. There is no history of Esophageal cancer, Pancreatic cancer, Rectal cancer, or Stomach cancer.  ROS:   Please see the history of present illness.    + occasional palpitations + occasional chest tightness All other systems reviewed and are negative.  Labs/Other Studies Reviewed:    The following studies were reviewed today:  Coronary CTA 12/21/21 IMPRESSION: 1. Coronary calcium score of 0. This was 0 percentile for age and sex matched control.   2. Normal coronary origin with right dominance.   3. CAD-RADS 0. No evidence of CAD (0%). Consider non-atherosclerotic causes of chest pain.    MR Cardiac Stress Test 01/02/21 IMPRESSION: 1.  No evidence of ischemia on stress perfusion imaging   2. Asymmetric hypertrophy of basal septum measuring up to 15mm (6mm in inferolateral wall), meeting criteria for hypertrophic cardiomyopathy   3.  Normal LV size with hyperdynamic systolic function (EF 72%)   4.  Normal RV size and systolic function (EF 63%)   5. Small amount of patchy LGE (<1% of total myocardial mass) in basal septum, consistent with HCM   1. Left ventricular ejection fraction, by visual estimation, is 65 to  70%. The left ventricle has normal function. There is mildly increased   left ventricular hypertrophy.   2. Elevated left atrial pressure.   3. Left ventricular diastolic parameters are consistent with Grade II  diastolic dysfunction (pseudonormalization).   4. The left ventricle has no regional wall motion abnormalities.   5. Intracavitary gradient up to 5 mmHG.  6. Global right ventricle has normal systolic function.The right  ventricular size is normal. No increase in right ventricular wall  thickness.   7. Left atrial size was mildly dilated.   8. The mitral valve is grossly normal. Trivial mitral valve  regurgitation.   9. The tricuspid valve is grossly normal.  10. The aortic valve is tricuspid. Aortic valve regurgitation is not  visualized. No evidence of aortic valve sclerosis or stenosis.  11. Mildly elevated pulmonary artery systolic pressure.  12. The tricuspid regurgitant velocity is 3.00 m/s, and with an assumed  right atrial pressure of 10 mmHg, the estimated right ventricular systolic  pressure is mildly elevated at 45.9 mmHg.   In comparison to the previous echocardiogram(s): EF remains unchanged on  this study. Intracavitary gradient ~5 mmHG on the present study, and noted  to be up to 34 mmHG on the prior study. No obvious asymmetric hypertrophy  or SAM on this study. Compared to 09/08/2017.   Recent Labs: 11/18/2022: ALT 15; BUN 28; Creatinine, Ser 1.44; Hemoglobin 14.2; Platelets 170.0; Potassium 4.1; Sodium 142  Recent Lipid Panel    Component Value Date/Time   CHOL 138 11/18/2022 0921   CHOL 140 11/11/2016 0900   TRIG 83.0 11/18/2022 0921   HDL 70.20 11/18/2022 0921   HDL 51 11/11/2016 0900   CHOLHDL 2 11/18/2022 0921   VLDL 16.6 11/18/2022 0921   LDLCALC 51 11/18/2022 0921   LDLCALC 52 04/17/2020 1451     Risk Assessment/Calculations:           Physical Exam:    VS:  BP (!) 144/78   Pulse (!) 54   Ht 5\' 6"  (1.676 m)   Wt 123 lb 12.8 oz (56.2 kg)   SpO2 98%   BMI 19.98 kg/m     Wt Readings from Last 3  Encounters:  01/05/23 123 lb 12.8 oz (56.2 kg)  11/18/22 126 lb (57.2 kg)  10/28/22 125 lb (56.7 kg)     GEN: Thin elderly in no acute distress HEENT: Normal NECK: No JVD; No carotid bruits CARDIAC: RRR, no murmurs, rubs, gallops RESPIRATORY:  Clear to auscultation without rales, wheezing or rhonchi  ABDOMEN: Soft, non-tender, non-distended MUSCULOSKELETAL:  No edema; No deformity. 2+ pedal pulses, equal bilaterally SKIN: Warm and dry NEUROLOGIC:  Alert and oriented x 3 PSYCHIATRIC:  Normal affect   EKG:  EKG is ordered today.  The ekg ordered today demonstrates sinus bradycardia at 54 bpm, pattern to suggest right ventricular conduction delay, left anterior fascicular block, no acute change from previous tracing.  HYPERTENSION CONTROL Vitals:   01/05/23 1446 01/05/23 1737  BP: (!) 152/88 (!) 144/78    The patient's blood pressure is elevated above target today.  In order to address the patient's elevated BP: Blood pressure will be monitored at home to determine if medication changes need to be made.       Diagnoses:    1. Palpitations   2. Chest pain   3. PSVT (paroxysmal supraventricular tachycardia)   4. Hypertrophic obstructive cardiomyopathy (HOCM) (HCC)   5. Pulmonary embolism, bilateral (HCC)   6. Chronic anticoagulation    Assessment and Plan:     Palpitations: History of PSVT and PAF noted in the past. No long-term cardiac monitor to review.  We will place a 14-day Zio patch for evaluation of arrhythmia. Is on anticoagulation for hx of PE. Continue Norpace, metoprolol.   HOCM/Chest pain: History of cardiac MRI 12/2020 with LGE uptake less than 1% total  myocardial mass in basal septum, no evidence of ischemia, asymmetric hypertrophy meeting criteria for hypertrophic cardiomyopathy. Normal LV size with hyperdynamic systolic function EF 72%, normal RV size and systolic function EF 63%. Coronary CTA 12/2021 revealed calcium score of 0 with no evidence of CAD. She  denies dyspnea, orthopnea, PND, presyncope or syncope. She has mild chest tightness on occasion. We will place a 14-day cardiac monitor as noted above for evaluation of arrhythmia. Continue metoprolol, isosorbide, Norpace.  Hypertension: BP is elevated in clinic today. She reports history of whitecoat hypertension. BP at home has been well-controlled.  History of PE on chronic anticoagulation: No symptoms to indicate PE. No bleeding concerns on Xarelto.  Continue anticoagulation with Xarelto 15 mg daily.     Disposition: Keep your August appointment with Dr. Tenny Craw  Medication Adjustments/Labs and Tests Ordered: Current medicines are reviewed at length with the patient today.  Concerns regarding medicines are outlined above.  Orders Placed This Encounter  Procedures   LONG TERM MONITOR (3-14 DAYS)   EKG 12-Lead   Meds ordered this encounter  Medications   nitroGLYCERIN (NITROSTAT) 0.4 MG SL tablet    Sig: Place 1 tablet (0.4 mg total) under the tongue every 5 (five) minutes as needed for chest pain.    Dispense:  75 tablet    Refill:  1   hydrochlorothiazide (MICROZIDE) 12.5 MG capsule    Sig: Take 1 capsule (12.5 mg total) by mouth daily.    Dispense:  30 capsule    Refill:  3    Patient Instructions  Medication Instructions:  Your physician recommends that you continue on your current medications as directed. Please refer to the Current Medication list given to you today.  *If you need a refill on your cardiac medications before your next appointment, please call your pharmacy*  Testing/Procedures: Your physician has recommended that you wear an event monitor. Event monitors are medical devices that record the heart's electrical activity. Doctors most often [place] these monitors to diagnose arrhythmias. Arrhythmias are problems with the speed or rhythm of the heartbeat. The monitor is a small, portable device. You can wear one while you do your normal daily activities. This is usually  used to diagnose what is causing palpitations/syncope (passing out).    Follow-Up: At [facility], you and your health needs are our priority.  As part of our continuing mission to provide you with exceptional heart care, we have created designated Provider Care Teams.  These Care Teams include your primary Cardiologist (physician) and Advanced Practice Providers (APPs -  Physician Assistants and Nurse Practitioners) who all work together to provide you with the care you need, when you need it.  We recommend signing up for the patient portal called "MyChart".  Sign up information is provided on this After Visit Summary.  MyChart is used to connect with patients for Virtual Visits (Telemedicine).  Patients are able to view lab/test results, encounter notes, upcoming appointments, etc.  Non-urgent messages can be sent to your provider as well.   To learn more about what you can do with MyChart, go to [email or web address].    Your next appointment:   As scheduled   Provider:   Dietrich Pates, MD   Other Instructions Christena Deem- Long Term Monitor Instructions  Your physician has requested you wear a ZIO patch monitor for 14 days.  This is a single patch monitor. [employer] supplies one patch monitor per enrollment. Additional stickers are not available. Please do not apply patch if  you will be having a Nuclear Stress Test,  Echocardiogram, Cardiac CT, MRI, or Chest Xray during the period you would be wearing the  monitor. The patch cannot be worn during these tests. You cannot remove and re-apply the  ZIO XT patch monitor.  Your ZIO patch monitor will be mailed 3 day USPS to your address on file. It may take 3-5 days  to receive your monitor after you have been enrolled.  Once you have received your monitor, please review the enclosed instructions. Your monitor  has already been registered assigning a specific monitor serial # to you.  Billing and Patient Assistance Program  Information  We have supplied [employer] with any of your insurance information on file for billing purposes. [employer] offers a sliding scale Patient Assistance Program for patients that do not have  insurance, or whose insurance does not completely cover the cost of the ZIO monitor.  You must apply for the Patient Assistance Program to qualify for this discounted rate.  To apply, please call [employer] at [phone], select option 4, select option 2, ask to apply for  Patient Assistance Program. Meredeth Ide will ask your household income, and how many people  are in your household. They will quote your out-of-pocket cost based on that information.  [employer] will also be able to set up a 77-month, interest-free payment plan if needed.  Applying the monitor   Shave hair from upper left chest.  Hold abrader disc by orange tab. Rub abrader in 40 strokes over the upper left chest as  indicated in your monitor instructions.  Clean area with 4 enclosed alcohol pads. Let dry.  Apply patch as indicated in monitor instructions. Patch will be placed under collarbone on left  side of chest with arrow pointing upward.  Rub patch adhesive wings for 2 minutes. Remove white label marked "1". Remove the white  label marked "2". Rub patch adhesive wings for 2 additional minutes.  While looking in a mirror, press and release button in center of patch. A small green light will  flash 3-4 times. This will be your only indicator that the monitor has been turned on.  Do not shower for the first 24 hours. You may shower after the first 24 hours.  Press the button if you feel a symptom. You will hear a small click. Record Date, Time and  Symptom in the Patient Logbook.  When you are ready to remove the patch, follow instructions on the last 2 pages of Patient  Logbook. Stick patch monitor onto the last page of Patient Logbook.  Place Patient Logbook in the blue and white box. Use locking tab on box and tape box closed   securely. The blue and white box has prepaid postage on it. Please place it in the mailbox as  soon as possible. Your physician should have your test results approximately 7 days after the  monitor has been mailed back to [facility].  Call [facility] Customer Care at [phone] if you have questions regarding  your ZIO XT patch monitor. Call them immediately if you see an orange light blinking on your  monitor.  If your monitor falls off in less than 4 days, contact our Monitor department at [phone].  If your monitor becomes loose or falls off after 4 days call [employer] at [phone] for  suggestions on securing your monitor    Signed, Levi Aland, NP  01/05/2023 5:50 PM    [facility]

## 2023-01-02 NOTE — Telephone Encounter (Signed)
Left message to call the clinic. 

## 2023-01-05 ENCOUNTER — Ambulatory Visit: Payer: Medicare Other | Attending: Nurse Practitioner | Admitting: Nurse Practitioner

## 2023-01-05 ENCOUNTER — Encounter: Payer: Self-pay | Admitting: Nurse Practitioner

## 2023-01-05 ENCOUNTER — Ambulatory Visit (INDEPENDENT_AMBULATORY_CARE_PROVIDER_SITE_OTHER): Payer: Medicare Other

## 2023-01-05 ENCOUNTER — Other Ambulatory Visit: Payer: Self-pay | Admitting: Internal Medicine

## 2023-01-05 VITALS — BP 144/78 | HR 54 | Ht 66.0 in | Wt 123.8 lb

## 2023-01-05 DIAGNOSIS — R072 Precordial pain: Secondary | ICD-10-CM | POA: Diagnosis not present

## 2023-01-05 DIAGNOSIS — Z7901 Long term (current) use of anticoagulants: Secondary | ICD-10-CM

## 2023-01-05 DIAGNOSIS — R002 Palpitations: Secondary | ICD-10-CM

## 2023-01-05 DIAGNOSIS — R079 Chest pain, unspecified: Secondary | ICD-10-CM

## 2023-01-05 DIAGNOSIS — I2699 Other pulmonary embolism without acute cor pulmonale: Secondary | ICD-10-CM | POA: Diagnosis not present

## 2023-01-05 DIAGNOSIS — I421 Obstructive hypertrophic cardiomyopathy: Secondary | ICD-10-CM

## 2023-01-05 DIAGNOSIS — I471 Supraventricular tachycardia, unspecified: Secondary | ICD-10-CM

## 2023-01-05 MED ORDER — NITROGLYCERIN 0.4 MG SL SUBL: 0.4000 mg | SUBLINGUAL_TABLET | SUBLINGUAL | 1 refills | Status: DC | PRN

## 2023-01-05 MED ORDER — HYDROCHLOROTHIAZIDE 12.5 MG PO CAPS: 12.5000 mg | ORAL_CAPSULE | Freq: Every day | ORAL | 3 refills | Status: DC

## 2023-01-05 NOTE — Patient Instructions (Signed)
Medication Instructions:  Your physician recommends that you continue on your current medications as directed. Please refer to the Current Medication list given to you today.  *If you need a refill on your cardiac medications before your next appointment, please call your pharmacy*  Testing/Procedures: Your physician has recommended that you wear an event monitor. Event monitors are medical devices that record the heart's electrical activity. Doctors most often [place] these monitors to diagnose arrhythmias. Arrhythmias are problems with the speed or rhythm of the heartbeat. The monitor is a small, portable device. You can wear one while you do your normal daily activities. This is usually used to diagnose what is causing palpitations/syncope (passing out).    Follow-Up: At [facility], you and your health needs are our priority.  As part of our continuing mission to provide you with exceptional heart care, we have created designated Provider Care Teams.  These Care Teams include your primary Cardiologist (physician) and Advanced Practice Providers (APPs -  Physician Assistants and Nurse Practitioners) who all work together to provide you with the care you need, when you need it.  We recommend signing up for the patient portal called "MyChart".  Sign up information is provided on this After Visit Summary.  MyChart is used to connect with patients for Virtual Visits (Telemedicine).  Patients are able to view lab/test results, encounter notes, upcoming appointments, etc.  Non-urgent messages can be sent to your provider as well.   To learn more about what you can do with MyChart, go to [email or web address].    Your next appointment:   As scheduled   Provider:   Dietrich Pates, MD   Other Instructions Christena Deem- Long Term Monitor Instructions  Your physician has requested you wear a ZIO patch monitor for 14 days.  This is a single patch monitor. [employer] supplies one patch monitor per  enrollment. Additional stickers are not available. Please do not apply patch if you will be having a Nuclear Stress Test,  Echocardiogram, Cardiac CT, MRI, or Chest Xray during the period you would be wearing the  monitor. The patch cannot be worn during these tests. You cannot remove and re-apply the  ZIO XT patch monitor.  Your ZIO patch monitor will be mailed 3 day USPS to your address on file. It may take 3-5 days  to receive your monitor after you have been enrolled.  Once you have received your monitor, please review the enclosed instructions. Your monitor  has already been registered assigning a specific monitor serial # to you.  Billing and Patient Assistance Program Information  We have supplied [employer] with any of your insurance information on file for billing purposes. [employer] offers a sliding scale Patient Assistance Program for patients that do not have  insurance, or whose insurance does not completely cover the cost of the ZIO monitor.  You must apply for the Patient Assistance Program to qualify for this discounted rate.  To apply, please call [employer] at [phone], select option 4, select option 2, ask to apply for  Patient Assistance Program. Meredeth Ide will ask your household income, and how many people  are in your household. They will quote your out-of-pocket cost based on that information.  [employer] will also be able to set up a 37-month, interest-free payment plan if needed.  Applying the monitor   Shave hair from upper left chest.  Hold abrader disc by orange tab. Rub abrader in 40 strokes over the upper left chest as  indicated in your monitor  instructions.  Clean area with 4 enclosed alcohol pads. Let dry.  Apply patch as indicated in monitor instructions. Patch will be placed under collarbone on left  side of chest with arrow pointing upward.  Rub patch adhesive wings for 2 minutes. Remove white label marked "1". Remove the white  label marked "2". Rub patch  adhesive wings for 2 additional minutes.  While looking in a mirror, press and release button in center of patch. A small green light will  flash 3-4 times. This will be your only indicator that the monitor has been turned on.  Do not shower for the first 24 hours. You may shower after the first 24 hours.  Press the button if you feel a symptom. You will hear a small click. Record Date, Time and  Symptom in the Patient Logbook.  When you are ready to remove the patch, follow instructions on the last 2 pages of Patient  Logbook. Stick patch monitor onto the last page of Patient Logbook.  Place Patient Logbook in the blue and white box. Use locking tab on box and tape box closed  securely. The blue and white box has prepaid postage on it. Please place it in the mailbox as  soon as possible. Your physician should have your test results approximately 7 days after the  monitor has been mailed back to [facility].  Call [facility] Customer Care at [phone] if you have questions regarding  your ZIO XT patch monitor. Call them immediately if you see an orange light blinking on your  monitor.  If your monitor falls off in less than 4 days, contact our Monitor department at [phone].  If your monitor becomes loose or falls off after 4 days call [employer] at [phone] for  suggestions on securing your monitor

## 2023-01-05 NOTE — Progress Notes (Unsigned)
Enrolled for [employer] to mail a ZIO XT long term holter monitor to the patients address on file.   Dr. Ross to read. 

## 2023-01-09 DIAGNOSIS — R002 Palpitations: Secondary | ICD-10-CM | POA: Diagnosis not present

## 2023-01-09 DIAGNOSIS — I471 Supraventricular tachycardia, unspecified: Secondary | ICD-10-CM

## 2023-01-09 DIAGNOSIS — R079 Chest pain, unspecified: Secondary | ICD-10-CM

## 2023-01-15 ENCOUNTER — Other Ambulatory Visit: Payer: Self-pay | Admitting: Nurse Practitioner

## 2023-01-15 DIAGNOSIS — R072 Precordial pain: Secondary | ICD-10-CM

## 2023-01-24 ENCOUNTER — Telehealth: Payer: Medicare Other

## 2023-01-31 DIAGNOSIS — R002 Palpitations: Secondary | ICD-10-CM | POA: Diagnosis not present

## 2023-01-31 DIAGNOSIS — R079 Chest pain, unspecified: Secondary | ICD-10-CM | POA: Diagnosis not present

## 2023-02-01 ENCOUNTER — Other Ambulatory Visit: Payer: Self-pay | Admitting: *Deleted

## 2023-03-18 ENCOUNTER — Other Ambulatory Visit: Payer: Self-pay | Admitting: Internal Medicine

## 2023-03-18 DIAGNOSIS — I2699 Other pulmonary embolism without acute cor pulmonale: Secondary | ICD-10-CM

## 2023-03-20 NOTE — Telephone Encounter (Signed)
Prescription refill request for Xarelto received.  Indication:afib Last office visit:5/24 Weight:56.2  kg Age:87 Scr:1.44  3/24 CrCl:25.34  ml/min  Prescription refilled

## 2023-03-21 ENCOUNTER — Other Ambulatory Visit: Payer: Self-pay | Admitting: Internal Medicine

## 2023-03-21 DIAGNOSIS — I259 Chronic ischemic heart disease, unspecified: Secondary | ICD-10-CM

## 2023-03-21 DIAGNOSIS — E782 Mixed hyperlipidemia: Secondary | ICD-10-CM

## 2023-03-21 DIAGNOSIS — I421 Obstructive hypertrophic cardiomyopathy: Secondary | ICD-10-CM

## 2023-04-10 ENCOUNTER — Ambulatory Visit (INDEPENDENT_AMBULATORY_CARE_PROVIDER_SITE_OTHER): Payer: Medicare Other | Admitting: Family Medicine

## 2023-04-10 ENCOUNTER — Encounter: Payer: Self-pay | Admitting: Family Medicine

## 2023-04-10 ENCOUNTER — Other Ambulatory Visit: Payer: Self-pay | Admitting: *Deleted

## 2023-04-10 VITALS — BP 136/88 | HR 72 | Temp 98.1°F | Resp 20 | Ht 66.0 in | Wt 119.0 lb

## 2023-04-10 DIAGNOSIS — R072 Precordial pain: Secondary | ICD-10-CM

## 2023-04-10 DIAGNOSIS — R102 Pelvic and perineal pain: Secondary | ICD-10-CM

## 2023-04-10 DIAGNOSIS — R634 Abnormal weight loss: Secondary | ICD-10-CM | POA: Diagnosis not present

## 2023-04-10 LAB — URINALYSIS, ROUTINE W REFLEX MICROSCOPIC
Bilirubin Urine: NEGATIVE
Ketones, ur: NEGATIVE
Leukocytes,Ua: NEGATIVE
Nitrite: NEGATIVE
Specific Gravity, Urine: 1.015 (ref 1.000–1.030)
Total Protein, Urine: NEGATIVE
Urine Glucose: NEGATIVE
Urobilinogen, UA: 0.2 (ref 0.0–1.0)
pH: 6 (ref 5.0–8.0)

## 2023-04-10 MED ORDER — ATORVASTATIN CALCIUM 10 MG PO TABS: 10.0000 mg | ORAL_TABLET | Freq: Every day | ORAL | 1 refills | Status: DC

## 2023-04-10 MED ORDER — HYDROCHLOROTHIAZIDE 12.5 MG PO CAPS: 12.5000 mg | ORAL_CAPSULE | Freq: Every day | ORAL | 1 refills | Status: DC

## 2023-04-10 NOTE — Progress Notes (Signed)
Assessment & Plan:  1. Suprapubic pain Patient's primary concern is her abdominal pain.  The diarrhea and nausea are rare and not of concern.  She is worried because her sister passed away with colon cancer.  We attempted to collect a urine specimen while patient was in the office to rule in or out a UTI but she was not able to urinate.  Will address when she brings a urine back. - Urinalysis, Routine w reflex microscopic; Future - Urine Culture; Future  2. Weight loss 6% weight loss over the past year.  Reassurance provided that this is not overly significant.   Follow up plan: Return if symptoms worsen or fail to improve.  [name], MSN, APRN, FNP-C  Subjective:  HPI: Monique [name] is a 87 y.o. female presenting on 04/10/2023 for GI Problem (General abdominal pain - comes and goes - going on for about 1 month. Occ. Diarrhea and nausea (no vomit) /Eating good - not gaining any weight. )  Patient complains of abdominal pain, diarrhea, and nausea. The abdominal pain is described as throbbing, and is 8/10 in intensity when it occurs, which is every 2 to 3 days. Pain is located in the suprapubic without radiation. Onset was 1 month ago. Symptoms have been unchanged since. Aggravating factors: none.  Alleviating factors: Pepto Bismol. Associated symptoms: belching. The patient denies fever, hematochezia, hematuria, melena, and vomiting.  Diarrhea is only occasionally, and is not at the same time as she has abdominal pain.  Nausea is rare.  Her concern is that she had a sister passed away with colon cancer and she has not been gaining weight.   ROS: Negative unless specifically indicated above in HPI.   Relevant past medical history reviewed and updated as indicated.   Allergies and medications reviewed and updated.   Current Outpatient Medications:    acetaminophen (TYLENOL) 500 MG tablet, Take 1,000 mg by mouth every 6 (six) hours as needed for moderate pain or headache., Disp: ,  Rfl:    atorvastatin (LIPITOR) 10 MG tablet, Take 1 tablet (10 mg total) by mouth daily., Disp: 90 tablet, Rfl: 3   disopyramide (NORPACE) 150 MG capsule, TAKE 1 CAPSULE (150 MG TOTAL) BY MOUTH 2 (TWO) TIMES DAILY. APPOINTMENT NEEDED FOR FUTURE REFILLS. THANK YOU., Disp: 180 capsule, Rfl: 1   hydrochlorothiazide (MICROZIDE) 12.5 MG capsule, Take 1 capsule (12.5 mg total) by mouth daily., Disp: 30 capsule, Rfl: 3   isosorbide mononitrate (IMDUR) 30 MG 24 hr tablet, Take 0.5 tablets (15 mg total) by mouth daily. Can take an additional half tablet as needed for chest pain, Disp: 90 tablet, Rfl: 1   lidocaine (LIDODERM) 5 %, Place 1 patch onto the skin daily., Disp: 30 patch, Rfl: 11   metFORMIN (GLUCOPHAGE) 500 MG tablet, TAKE 1 TABLET BY MOUTH EVERY DAY WITH BREAKFAST, Disp: 90 tablet, Rfl: 1   metoprolol tartrate (LOPRESSOR) 50 MG tablet, Take 1 tablet (50 mg total) by mouth 2 (two) times daily., Disp: 180 tablet, Rfl: 3   mirtazapine (REMERON) 15 MG tablet, Take 1 tablet (15 mg total) by mouth at bedtime., Disp: 90 tablet, Rfl: 3   XARELTO 15 MG TABS tablet, TAKE 1 TABLET BY MOUTH EVERY DAY WITH SUPPER, Disp: 30 tablet, Rfl: 5   nitroGLYCERIN (NITROSTAT) 0.4 MG SL tablet, PLACE 1 TABLET UNDER THE TONGUE EVERY 5 MINUTES AS NEEDED FOR CHEST PAIN. (Patient not taking: Reported on 04/10/2023), Disp: 25 tablet, Rfl: 6  No Known Allergies  Objective:  BP 136/88   Pulse 72   Temp 98.1 F (36.7 C)   Resp 20   Ht 5\' 6"  (1.676 m)   Wt 119 lb (54 kg)   BMI 19.21 kg/m    Physical Exam Vitals reviewed.  Constitutional:      General: She is not in acute distress.    Appearance: Normal appearance. She is not ill-appearing, toxic-appearing or diaphoretic.  HENT:     Head: Normocephalic and atraumatic.  Eyes:     General: No scleral icterus.       Right eye: No discharge.        Left eye: No discharge.     Conjunctiva/sclera: Conjunctivae normal.  Cardiovascular:     Rate and Rhythm: Normal  rate.  Pulmonary:     Effort: Pulmonary effort is normal. No respiratory distress.  Abdominal:     General: Abdomen is flat. Bowel sounds are normal. There is no distension or abdominal bruit. There are no signs of injury.     Palpations: Abdomen is soft. There is no shifting dullness, fluid wave, hepatomegaly, splenomegaly, mass or pulsatile mass.     Tenderness: There is abdominal tenderness in the suprapubic area.  Musculoskeletal:        General: Normal range of motion.     Cervical back: Normal range of motion.  Skin:    General: Skin is warm and dry.     Capillary Refill: Capillary refill takes less than 2 seconds.  Neurological:     General: No focal deficit present.     Mental Status: She is alert and oriented to person, place, and time. Mental status is at baseline.  Psychiatric:        Mood and Affect: Mood normal.        Behavior: Behavior normal.        Thought Content: Thought content normal.        Judgment: Judgment normal.

## 2023-04-16 NOTE — Progress Notes (Unsigned)
Cardiology Office Note    Date:  04/22/2023   ID:  Monique, [name] November 02, 1935, MRN [id number]  PCP:  Myrlene Broker, MD  Cardiologist: Dr. Tenny Craw  Chief Complaint: Follow up for  PAF , HCM and CP     History of Present Illness:   Monique [name] is a 87 y.o. female with hx of HCM,  HTN, SVT(rx metoprolol), HL, CP, PAF, bilateral PE, DVT, CKD stage III, .  The patient  has undergone several stress test (nuclear, 2016, 2019).  In April 2022 she had an MRI stress test ordered by Tereso Newcomer.  This showed a normal hyperdynamic response to stress.  MRI showed a ventricular septum measuring at 15 mm.  There was less than 1% late gadolinium enhancement noted at the septum base.  LVEF 72%.  2023:  Chest pain   CCTA  Calcium score 0   Normal coronary arteries I saw the pt in March 2023   She had some atypical pains then     [facility] was then seen by Inda Coke in April  Noted L sided CP   Also mid sternal pain.  Pt was set up for a coronary CT angiogram which showed CA score    I saw the pt in clinic in 2023   Since then she has been seen by E Louanne Skye and Benjamine Sprague     Since seen se denies CP   Breathing is OK   No palpitations.  No dizziness  She is having problems with RLQ pain.   Not eating normally   Bowels changed some    She has not seen GI in years    Denies F/C   Past Medical History:  Diagnosis Date   Abdominal pain 11/18/2015   Acute respiratory failure with hypoxia (HCC) 06/27/2014   Breast pain, left 10/11/2016   Cerebral aneurysm 2000 06/27/2014   Chest pain 10/30/2014   CHF (congestive heart failure) (HCC)    Chronic anticoagulation 06/2014   Coumadin started after bilateral PE   Chronic kidney disease, stage III (moderate) (HCC) 11/21/2014   Clotting disorder (HCC)    right was removed, still have left cataract   Diabetes (HCC)    Dizziness and giddiness 11/28/2014   DVT of lower extremity (deep venous thrombosis) (HCC) 11/28/2014   Dysuria 07/07/2014   Elevated troponin  11/28/2014   Fatty liver    Gastritis 05/16/2012   Chronic dyspepsia on PPI daily.    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   CMR 4/22: EF 72, small amount of patchy LGE in basal septum c/w HCM, asymmetric basal septal hypertrophy (15 mm) c/w HCM, no ischemia, ascending aorta 38 mm   Kidney stones    LVH (left ventricular hypertrophy)    Orthostatic hypotension 11/28/2014   PAF (paroxysmal atrial fibrillation) (HCC) 2000   Palpitations 11/28/2014   PAROXYSMAL ATRIAL FIBRILLATION 05/20/2008   Qualifier: Diagnosis of  By: Debby Bud MD, Rosalyn Gess    PE (pulmonary embolism) 06/2014   Bilateral   Postmenopausal osteoporosis 04/2019   T score -3.3   Pulmonary embolism, bilateral (HCC) 06/27/2014   SOB (shortness of breath) 06/27/2014   Subarachnoid hemorrhage (HCC) 2000   UTI (urinary tract infection) 01/04/2015  Past Surgical History:  Procedure Laterality Date   ABDOMINAL HYSTERECTOMY  2000   Brain hemorrage     2000   CARDIAC CATHETERIZATION  05/2008   Nl cors, no gradient, EF 60%    Current Medications: Prior to Admission medications   Medication Sig Start Date End Date Taking? Authorizing Provider  amLODipine (NORVASC) 5 MG tablet Take 1 tablet (5 mg total) by mouth 2 (two) times daily. 12/02/16   Pricilla Riffle, MD  disopyramide (NORPACE) 150 MG capsule Take 1 capsule (150 mg total) by mouth 2 (two) times daily. 01/13/17   Pricilla Riffle, MD  metoprolol tartrate (LOPRESSOR) 25 MG tablet Take 1 tablet (25 mg total) by mouth 2 (two) times daily. 12/06/16   Pricilla Riffle, MD  mirtazapine (REMERON) 15 MG tablet TAKE 1 TABLET (15 MG TOTAL) BY MOUTH AT BEDTIME. 06/20/17   Myrlene Broker, MD  montelukast (SINGULAIR) 10 MG tablet Take 1  tablet (10 mg total) by mouth at bedtime. 06/20/17   Myrlene Broker, MD  nitroGLYCERIN (NITROSTAT) 0.4 MG SL tablet Place 1 tablet (0.4 mg total) under the tongue every 5 (five) minutes as needed for chest pain. 03/30/15   Pricilla Riffle, MD  pantoprazole (PROTONIX) 40 MG tablet Take 1 tablet (40 mg total) by mouth daily. Need annual visit of further refills 06/07/17   Myrlene Broker, MD  Rivaroxaban (XARELTO) 15 MG TABS tablet Take 1 tablet (15 mg total) by mouth daily with supper. 01/17/17   Pricilla Riffle, MD  sertraline (ZOLOFT) 50 MG tablet TAKE 1 TABLET (50 MG TOTAL) BY MOUTH DAILY. 02/24/15   Myrlene Broker, MD  simvastatin (ZOCOR) 40 MG tablet TAKE 1 TABLET (40 MG TOTAL) BY MOUTH AT BEDTIME. 01/13/17   Pricilla Riffle, MD    Allergies:   Patient has no known allergies.   Social History   Socioeconomic History   Marital status: Divorced    Spouse name: Not on file   Number of children: 4   Years of education: 12   Highest education level: Not on file  Occupational History   Occupation: Retired Doctor, general practice  Tobacco Use   Smoking status: Former    Current packs/day: 0.00    Types: Cigarettes    Quit date: 09/06/1967    Years since quitting: 55.6   Smokeless tobacco: Never  Vaping Use   Vaping status: Never Used  Substance and Sexual Activity   Alcohol use: No    Alcohol/week: 0.0 standard drinks of alcohol   Drug use: No   Sexual activity: Not Currently    Comment: 1st intercourse 87yo-Fewer than 5 partners  Other Topics Concern   Not on file  Social History Narrative   HSG. Married '61 - '88/divorced. 3 sons - '74, '59, '62, 1 daughter '63; 5 grandchildren. 10/06/20 Lives alone in two story home.    She does not drive.   Retired from [employer].   Social Determinants of Health   Financial Resource Strain: Low Risk  (04/29/2022)   Overall Financial Resource Strain (CARDIA)    Difficulty of Paying Living Expenses: Not hard at all  Food  Insecurity: No Food Insecurity (04/29/2022)   Hunger Vital Sign    Worried About Running Out of Food in the Last Year: Never true    Ran Out of Food in the Last Year: Never true  Transportation Needs: No Transportation Needs (04/29/2022)   PRAPARE - Transportation    Lack of  Transportation (Medical): No    Lack of Transportation (Non-Medical): No  Physical Activity: Sufficiently Active (04/29/2022)   Exercise Vital Sign    Days of Exercise per Week: 5 days    Minutes of Exercise per Session: 30 min  Stress: No Stress Concern Present (04/29/2022)   [employer] of Occupational Health - Occupational Stress Questionnaire    Feeling of Stress : Not at all  Social Connections: Moderately Integrated (04/29/2022)   Social Connection and Isolation Panel [NHANES]    Frequency of Communication with Friends and Family: More than three times a week    Frequency of Social Gatherings with Friends and Family: More than three times a week    Attends Religious Services: More than 4 times per year    Active Member of [employer] or Organizations: Yes    Attends Engineer, structural: More than 4 times per year    Marital Status: Divorced     Family History:  The patient's family history includes Arrhythmia in her mother and sister; Breast cancer (age of onset: 42) in her daughter; Breast cancer (age of onset: 68) in her mother; Cervical cancer in her sister; Colon cancer in her sister; Heart disease in her mother; Stroke in her mother.   ROS:   Please see the history of present illness.    ROS All other systems reviewed and are negative.   PHYSICAL EXAM:   VS:  BP 122/80   Pulse 60   Ht 5\' 6"  (1.676 m)   Wt 118 lb (53.5 kg)   BMI 19.05 kg/m    GEN: Thin 87 year old in no acute distress  HEENT: normal  Neck: JVP is normal   Cardiac: RRR;No murmur No LE edema    Respiratory:  clear to auscultation  GI: soft, nontender, nondistended, + BS  Wt Readings from Last 3 Encounters:  04/21/23 118  lb (53.5 kg)  04/10/23 119 lb (54 kg)  01/05/23 123 lb 12.8 oz (56.2 kg)      Studies/Labs Reviewed:   EKG:  EKG is not ordered today    Recent Labs: 11/18/2022: ALT 15 04/21/2023: BUN 31; Creatinine, Ser 1.57; Hemoglobin 13.6; Platelets 168; Potassium 3.9; Sodium 143; TSH 1.180   Lipid Panel    Component Value Date/Time   CHOL 138 11/18/2022 0921   CHOL 140 11/11/2016 0900   TRIG 83.0 11/18/2022 0921   HDL 70.20 11/18/2022 0921   HDL 51 11/11/2016 0900   CHOLHDL 2 11/18/2022 0921   VLDL 16.6 11/18/2022 0921   LDLCALC 51 11/18/2022 0921   LDLCALC 52 04/17/2020 1451    Additional studies/ records that were reviewed today include:  Monitor  May 2024  Predominant rhythm:  Sinus rhythm   Rates 41 to 87 bpm  Average HR 56 bpm Rare PVC, PAC. Short burst VT (4 beats at 143 bpm.   15 episodes of SVT, fasted for 6 beats at 156 bpm; longest SVT for 16 beats at 123 bpm   Triggered events/ diary entries corresponded to sinus rhythm    CCTA  APril 2023  1. Coronary calcium score of 0. This was 0 percentile for age and sex matched control.   2. Normal coronary origin with right dominance.   3. CAD-RADS 0. No evidence of CAD (0%). Consider non-atherosclerotic causes of chest pain.   Echo   Jan 2021  1. Left ventricular ejection fraction, by visual estimation, is 65 to 70%. The left ventricle has normal function. There is mildly increased left ventricular  hypertrophy. 2. Elevated left atrial pressure. 3. Left ventricular diastolic parameters are consistent with Grade II diastolic dysfunction (pseudonormalization). 4. The left ventricle has no regional wall motion abnormalities. 5. Intracavitary gradient up to 5 mmHG. 6. Global right ventricle has normal systolic function.The right ventricular size is normal. No increase in right ventricular wall thickness. 7. Left atrial size was mildly dilated. 8. The mitral valve is grossly normal. Trivial mitral valve regurgitation. 9.  The tricuspid valve is grossly normal. 10. The aortic valve is tricuspid. Aortic valve regurgitation is not visualized. No evidence of aortic valve sclerosis or stenosis. 11. Mildly elevated pulmonary artery systolic pressure. 12. The tricuspid regurgitant velocity is 3.00 m/s, and with an assumed right atrial pressure of 10 mmHg, the estimated right ventricular systolic pressure is mildly elevated at 45.9 mmHg. In comparison to the previous echocardiogram(s): EF remains unchanged on this study. Intracavitary gradient ~5 mmHG on the present study, and noted to be up to 34 mmHG on the prior study. No obvious asymmetric hypertrophy or SAM on this study. Compared to 09/08/2017.  Myovue Dec 2020  The left ventricular ejection fraction is normal (55-65%). Nuclear stress EF: 65%. No T wave inversion was noted during stress. There was no ST segment deviation noted during stress. This is a low risk study.   Normal perfusion. LVEF 65% with normal wall motion. This is a low risk study.   ASSESSMENT & PLAN:   1 Abdominal pain  This is patient's biggest complaint   Will check  CBC, BMET.   If Cr OK will consider set up for CT abdomen Pt should also set up for appt with PCP and possibly GI  2  Hx of CP   CCTA nromal coronary arteries      3  HOCM  In past LV was very thick, hyperdynamic with increased gradient thorugh LVOT  Stress MRI  in 2022 showed  IVS of 15 mm, meeting criteria for [facility]  an only mild patchy LGE     Follow   Chest pain atypical. Nnoncardiac   CT scan shows Ca score of 9   Normal coronary arteries     2 SVT.  One episode in 2022  B BLOcker increased. REcent monitor without short self limited spells   3  PAF   No recent recurrenc  CHADSVASc is 4   Keep on Xarelto    3. HTN BP elevated on arrival   Better on recheck   122/80  5  Hx of  PE  Keep on Xarelto    6. HLD  LDL 51  HDL 70  Keep on atorvastatin  7  DM  Last A1C 6.8 (March 2024)    Medication Adjustments/Labs and  Tests Ordered: Current medicines are reviewed at length with the patient today.  Concerns regarding medicines are outlined above.  Medication changes, Labs and Tests ordered today are listed in the Patient Instructions below. Patient Instructions  Medication Instructions:   *If you need a refill on your cardiac medications before your next appointment, please call your pharmacy*   Lab Work: CBC, BMET, HGBA1C, TSH TODAY  If you have labs (blood work) drawn today and your tests are completely normal, you will receive your results only by: MyChart Message (if you have MyChart) OR A paper copy in the mail If you have any lab test that is abnormal or we need to change your treatment, we will call you to review the results.   Testing/Procedures:  Follow-Up: At [facility], you and your health needs are our priority.  As part of our continuing mission to provide you with exceptional heart care, we have created designated Provider Care Teams.  These Care Teams include your primary Cardiologist (physician) and Advanced Practice Providers (APPs -  Physician Assistants and Nurse Practitioners) who all work together to provide you with the care you need, when you need it.  We recommend signing up for the patient portal called "MyChart".  Sign up information is provided on this After Visit Summary.  MyChart is used to connect with patients for Virtual Visits (Telemedicine).  Patients are able to view lab/test results, encounter notes, upcoming appointments, etc.  Non-urgent messages can be sent to your provider as well.   To learn more about what you can do with MyChart, go to [email or web address].    Your next appointment:   6 month(s)  Provider:   Dietrich Pates, MD     Other Instructions     Signed, Dietrich Pates, MD  04/22/2023 7:48 AM    [facility] [address], [place], [place]  [address] Phone: [phone]; Fax: [phone]

## 2023-04-21 ENCOUNTER — Encounter: Payer: Self-pay | Admitting: Internal Medicine

## 2023-04-21 ENCOUNTER — Ambulatory Visit: Payer: Medicare Other | Attending: Internal Medicine | Admitting: Internal Medicine

## 2023-04-21 VITALS — BP 122/80 | HR 60 | Ht 66.0 in | Wt 118.0 lb

## 2023-04-21 DIAGNOSIS — I480 Paroxysmal atrial fibrillation: Secondary | ICD-10-CM

## 2023-04-21 DIAGNOSIS — R002 Palpitations: Secondary | ICD-10-CM | POA: Diagnosis not present

## 2023-04-21 DIAGNOSIS — I10 Essential (primary) hypertension: Secondary | ICD-10-CM | POA: Diagnosis not present

## 2023-04-21 DIAGNOSIS — Z7984 Long term (current) use of oral hypoglycemic drugs: Secondary | ICD-10-CM | POA: Diagnosis not present

## 2023-04-21 DIAGNOSIS — I421 Obstructive hypertrophic cardiomyopathy: Secondary | ICD-10-CM

## 2023-04-21 DIAGNOSIS — E785 Hyperlipidemia, unspecified: Secondary | ICD-10-CM | POA: Diagnosis not present

## 2023-04-21 DIAGNOSIS — E1169 Type 2 diabetes mellitus with other specified complication: Secondary | ICD-10-CM | POA: Diagnosis not present

## 2023-04-21 LAB — CBC

## 2023-04-21 NOTE — Patient Instructions (Signed)
Medication Instructions:   *If you need a refill on your cardiac medications before your next appointment, please call your pharmacy*   Lab Work: CBC, BMET, HGBA1C, TSH TODAY  If you have labs (blood work) drawn today and your tests are completely normal, you will receive your results only by: MyChart Message (if you have MyChart) OR A paper copy in the mail If you have any lab test that is abnormal or we need to change your treatment, we will call you to review the results.   Testing/Procedures:     Follow-Up: At [facility], you and your health needs are our priority.  As part of our continuing mission to provide you with exceptional heart care, we have created designated Provider Care Teams.  These Care Teams include your primary Cardiologist (physician) and Advanced Practice Providers (APPs -  Physician Assistants and Nurse Practitioners) who all work together to provide you with the care you need, when you need it.  We recommend signing up for the patient portal called "MyChart".  Sign up information is provided on this After Visit Summary.  MyChart is used to connect with patients for Virtual Visits (Telemedicine).  Patients are able to view lab/test results, encounter notes, upcoming appointments, etc.  Non-urgent messages can be sent to your provider as well.   To learn more about what you can do with MyChart, go to [email or web address].    Your next appointment:   6 month(s)  Provider:   Dietrich Pates, MD     Other Instructions

## 2023-04-22 LAB — CBC
Hematocrit: 40.7 % (ref 34.0–46.6)
Hemoglobin: 13.6 g/dL (ref 11.1–15.9)
MCH: 31.9 pg (ref 26.6–33.0)
MCHC: 33.4 g/dL (ref 31.5–35.7)
MCV: 95 fL (ref 79–97)
Platelets: 168 10*3/uL (ref 150–450)
RBC: 4.27 x10E6/uL (ref 3.77–5.28)
RDW: 12.5 % (ref 11.7–15.4)
WBC: 7.4 10*3/uL (ref 3.4–10.8)

## 2023-04-22 LAB — BASIC METABOLIC PANEL
BUN/Creatinine Ratio: 20 (ref 12–28)
BUN: 31 mg/dL — ABNORMAL HIGH (ref 8–27)
CO2: 29 mmol/L (ref 20–29)
Calcium: 9.8 mg/dL (ref 8.7–10.3)
Chloride: 101 mmol/L (ref 96–106)
Creatinine, Ser: 1.57 mg/dL — ABNORMAL HIGH (ref 0.57–1.00)
Glucose: 157 mg/dL — ABNORMAL HIGH (ref 70–99)
Potassium: 3.9 mmol/L (ref 3.5–5.2)
Sodium: 143 mmol/L (ref 134–144)
eGFR: 32 mL/min/{1.73_m2} — ABNORMAL LOW (ref >59)

## 2023-04-22 LAB — HEMOGLOBIN A1C
Est. average glucose Bld gHb Est-mCnc: 143 mg/dL
Hgb A1c MFr Bld: 6.6 % — ABNORMAL HIGH (ref 4.8–5.6)

## 2023-04-22 LAB — TSH: TSH: 1.18 u[IU]/mL (ref 0.450–4.500)

## 2023-04-25 ENCOUNTER — Other Ambulatory Visit: Payer: Self-pay

## 2023-04-25 ENCOUNTER — Telehealth: Payer: Self-pay | Admitting: Internal Medicine

## 2023-04-25 DIAGNOSIS — I10 Essential (primary) hypertension: Secondary | ICD-10-CM

## 2023-04-25 DIAGNOSIS — I421 Obstructive hypertrophic cardiomyopathy: Secondary | ICD-10-CM

## 2023-04-25 NOTE — Telephone Encounter (Signed)
Patient is returning call in regards to lab results.

## 2023-04-25 NOTE — Telephone Encounter (Signed)
Pt advised see lab result.

## 2023-04-26 ENCOUNTER — Ambulatory Visit (INDEPENDENT_AMBULATORY_CARE_PROVIDER_SITE_OTHER): Payer: Medicare Other

## 2023-04-26 VITALS — Ht 66.0 in | Wt 118.0 lb

## 2023-04-26 DIAGNOSIS — Z0000 Encounter for general adult medical examination without abnormal findings: Secondary | ICD-10-CM | POA: Diagnosis not present

## 2023-04-26 NOTE — Patient Instructions (Addendum)
Monique [name] , Thank you for taking time to come for your Medicare Wellness Visit. I appreciate your ongoing commitment to your health goals. Please review the following plan we discussed and let me know if I can assist you in the future.   Referrals/Orders/Follow-Ups/Clinician Recommendations: No  This is a list of the screening recommended for you and due dates:  Health Maintenance  Topic Date Due   Zoster (Shingles) Vaccine (1 of 2) 03/19/1988   Eye exam for diabetics  11/22/2018   COVID-19 Vaccine (4 - 2023-24 season) 05/06/2022   Flu Shot  05/19/2023*   Mammogram  10/08/2023   Hemoglobin A1C  10/22/2023   Yearly kidney health urinalysis for diabetes  11/18/2023   Complete foot exam   11/18/2023   Yearly kidney function blood test for diabetes  04/20/2024   Medicare Annual Wellness Visit  04/25/2024   DTaP/Tdap/Td vaccine (3 - Td or Tdap) 06/21/2027   Pneumonia Vaccine  Completed   DEXA scan (bone density measurement)  Completed   HPV Vaccine  Aged Out  *Topic was postponed. The date shown is not the original due date.    Advanced directives: (Declined) Advance directive discussed with you today. Even though you declined this today, please call our office should you change your mind, and we can give you the proper paperwork for you to fill out.  Next Medicare Annual Wellness Visit scheduled for next year: Yes

## 2023-04-26 NOTE — Progress Notes (Signed)
Subjective:   Monique [name] is a 87 y.o. female who presents for Medicare Annual (Subsequent) preventive examination.  Visit Complete: Virtual  I connected with  Monique [name] on 04/26/23 by a audio enabled telemedicine application and verified that I am speaking with the correct person using two identifiers.  Patient Location: Home  Provider Location: Office/Clinic  I discussed the limitations of evaluation and management by telemedicine. The patient expressed understanding and agreed to proceed.  Vital Signs: Because this visit was a virtual/telehealth visit, some criteria may be missing or patient reported. Any vitals not documented were not able to be obtained and vitals that have been documented are patient reported.    Review of Systems     Cardiac Risk Factors include: advanced age (>66men, >43 women);diabetes mellitus;dyslipidemia;family history of premature cardiovascular disease;hypertension     Objective:    Today's Vitals   04/26/23 0917  Weight: 118 lb (53.5 kg)  Height: 5\' 6"  (1.676 m)  PainSc: 9   PainLoc: Leg   Body mass index is 19.05 kg/m.     04/26/2023    9:19 AM 04/29/2022    4:27 PM 04/28/2021    4:18 PM 08/21/2019    6:01 PM 09/07/2017    1:55 PM 09/07/2017   11:06 AM 11/28/2014   10:59 PM  Advanced Directives  Does Patient Have a Medical Advance Directive? No No No No No No No  Would patient like information on creating a medical advance directive? No - Patient declined No - Patient declined No - Patient declined No - Patient declined No - Patient declined  No - patient declined information    Current Medications (verified) Outpatient Encounter Medications as of 04/26/2023  Medication Sig   acetaminophen (TYLENOL) 500 MG tablet Take 1,000 mg by mouth every 6 (six) hours as needed for moderate pain or headache.   atorvastatin (LIPITOR) 10 MG tablet Take 1 tablet (10 mg total) by mouth daily.   disopyramide (NORPACE) 150 MG capsule TAKE 1  CAPSULE (150 MG TOTAL) BY MOUTH 2 (TWO) TIMES DAILY. APPOINTMENT NEEDED FOR FUTURE REFILLS. THANK YOU.   isosorbide mononitrate (IMDUR) 30 MG 24 hr tablet Take 0.5 tablets (15 mg total) by mouth daily. Can take an additional half tablet as needed for chest pain   lidocaine (LIDODERM) 5 % Place 1 patch onto the skin daily.   metFORMIN (GLUCOPHAGE) 500 MG tablet TAKE 1 TABLET BY MOUTH EVERY DAY WITH BREAKFAST   metoprolol tartrate (LOPRESSOR) 50 MG tablet Take 1 tablet (50 mg total) by mouth 2 (two) times daily.   mirtazapine (REMERON) 15 MG tablet Take 1 tablet (15 mg total) by mouth at bedtime.   nitroGLYCERIN (NITROSTAT) 0.4 MG SL tablet PLACE 1 TABLET UNDER THE TONGUE EVERY 5 MINUTES AS NEEDED FOR CHEST PAIN.   XARELTO 15 MG TABS tablet TAKE 1 TABLET BY MOUTH EVERY DAY WITH SUPPER   No facility-administered encounter medications on file as of 04/26/2023.    Allergies (verified) Patient has no known allergies.   History: Past Medical History:  Diagnosis Date   Abdominal pain 11/18/2015   Acute respiratory failure with hypoxia (HCC) 06/27/2014   Breast pain, left 10/11/2016   Cerebral aneurysm 2000 06/27/2014   Chest pain 10/30/2014   CHF (congestive heart failure) (HCC)    Chronic anticoagulation 06/2014   Coumadin started after bilateral PE   Chronic kidney disease, stage III (moderate) (HCC) 11/21/2014   Clotting disorder (HCC)    right was removed, still  have left cataract   Diabetes (HCC)    Dizziness and giddiness 11/28/2014   DVT of lower extremity (deep venous thrombosis) (HCC) 11/28/2014   Dysuria 07/07/2014   Elevated troponin 11/28/2014   Fatty liver    Gastritis 05/16/2012   Chronic dyspepsia on PPI daily.    Headache, acute 06/08/2010   Qualifier: Diagnosis of  By: Debby Bud MD, Rosalyn Gess    Heart murmur    Hematochezia 11/18/2015   History of fibromuscular dysplasia 06/27/2014   HTN (hypertension)    Essential   Hyperlipidemia    Hypertrophic obstructive cardiomyopathy  (HOCM) (HCC) 05/20/2008   CMR 4/22: EF 72, small amount of patchy LGE in basal septum c/w HCM, asymmetric basal septal hypertrophy (15 mm) c/w HCM, no ischemia, ascending aorta 38 mm   Kidney stones    LVH (left ventricular hypertrophy)    Orthostatic hypotension 11/28/2014   PAF (paroxysmal atrial fibrillation) (HCC) 2000   Palpitations 11/28/2014   PAROXYSMAL ATRIAL FIBRILLATION 05/20/2008   Qualifier: Diagnosis of  By: Debby Bud MD, Rosalyn Gess    PE (pulmonary embolism) 06/2014   Bilateral   Postmenopausal osteoporosis 04/2019   T score -3.3   Pulmonary embolism, bilateral (HCC) 06/27/2014   SOB (shortness of breath) 06/27/2014   Subarachnoid hemorrhage (HCC) 2000   UTI (urinary tract infection) 01/04/2015   Past Surgical History:  Procedure Laterality Date   ABDOMINAL HYSTERECTOMY  2000   Brain hemorrage     2000   CARDIAC CATHETERIZATION  05/2008   Nl cors, no gradient, EF 60%   Family History  Problem Relation Age of Onset   Breast cancer Mother 43       Deceased   Stroke Mother    Arrhythmia Mother    Heart disease Mother    Colon cancer Sister    Cervical cancer Sister    Arrhythmia Sister    Breast cancer Daughter 47   Esophageal cancer Neg Hx    Pancreatic cancer Neg Hx    Rectal cancer Neg Hx    Stomach cancer Neg Hx    Social History   Socioeconomic History   Marital status: Divorced    Spouse name: Not on file   Number of children: 4   Years of education: 12   Highest education level: Not on file  Occupational History   Occupation: Retired Doctor, general practice  Tobacco Use   Smoking status: Former    Current packs/day: 0.00    Types: Cigarettes    Quit date: 09/06/1967    Years since quitting: 55.6   Smokeless tobacco: Never  Vaping Use   Vaping status: Never Used  Substance and Sexual Activity   Alcohol use: No    Alcohol/week: 0.0 standard drinks of alcohol   Drug use: No   Sexual activity: Not Currently    Comment: 1st intercourse 87yo-Fewer than 5  partners  Other Topics Concern   Not on file  Social History Narrative   HSG. Married '61 - '88/divorced. 3 sons - '74, '59, '62, 1 daughter '63; 5 grandchildren. 10/06/20 Lives alone in two story home.    She does not drive.   Retired from [employer].   Social Determinants of Health   Financial Resource Strain: Low Risk  (04/26/2023)   Overall Financial Resource Strain (CARDIA)    Difficulty of Paying Living Expenses: Not hard at all  Food Insecurity: No Food Insecurity (04/26/2023)   Hunger Vital Sign    Worried About Running Out of Food  in the Last Year: Never true    Ran Out of Food in the Last Year: Never true  Transportation Needs: No Transportation Needs (04/26/2023)   PRAPARE - Administrator, Civil Service (Medical): No    Lack of Transportation (Non-Medical): No  Physical Activity: Sufficiently Active (04/26/2023)   Exercise Vital Sign    Days of Exercise per Week: 5 days    Minutes of Exercise per Session: 30 min  Stress: No Stress Concern Present (04/26/2023)   [employer] of Occupational Health - Occupational Stress Questionnaire    Feeling of Stress : Not at all  Social Connections: Moderately Integrated (04/26/2023)   Social Connection and Isolation Panel [NHANES]    Frequency of Communication with Friends and Family: More than three times a week    Frequency of Social Gatherings with Friends and Family: More than three times a week    Attends Religious Services: More than 4 times per year    Active Member of [employer] or Organizations: Yes    Attends Engineer, structural: More than 4 times per year    Marital Status: Divorced    Tobacco Counseling Counseling given: Not Answered   Clinical Intake:  Pre-visit preparation completed: Yes  Pain : 0-10 Pain Score: 9      BMI - recorded: 19.05 Nutritional Status: BMI of 19-24  Normal Nutritional Risks: None Diabetes: Yes CBG done?: No Did pt. bring in CBG monitor from  home?: No  How often do you need to have someone help you when you read instructions, pamphlets, or other written materials from your doctor or pharmacy?: 1 - Never What is the last grade level you completed in school?: HSG  Interpreter Needed?: No  Information entered by :: Susie Cassette, LPN.   Activities of Daily Living    04/26/2023    9:22 AM 04/29/2022    4:29 PM  In your present state of health, do you have any difficulty performing the following activities:  Hearing? 0 0  Vision? 0 0  Difficulty concentrating or making decisions? 0 0  Walking or climbing stairs? 0 0  Dressing or bathing? 0 0  Doing errands, shopping? 0 0  Preparing Food and eating ? N N  Using the Toilet? N N  In the past six months, have you accidently leaked urine? N N  Do you have problems with loss of bowel control? N N  Managing your Medications? N N  Managing your Finances? N N  Housekeeping or managing your Housekeeping? N N    Patient Care Team: Myrlene Broker, MD as PCP - General (Internal Medicine) Pricilla Riffle, MD as PCP - Cardiology (Cardiology) Pricilla Riffle, MD (Cardiology) Kathyrn Sheriff, [facility] (Inactive) as Pharmacist (Pharmacist) Mateo Flow, MD as Consulting Physician (Ophthalmology)  Indicate any recent Medical Services you may have received from other than [facility] providers in the past year (date may be approximate).     Assessment:   This is a routine wellness examination for Monique [name].  Hearing/Vision screen Hearing Screening - Comments:: Patient denied any hearing difficulty.   No hearing aids.  Vision Screening - Comments:: Patient does wear otc readers.  Eye exam done by: [facility]   Dietary issues and exercise activities discussed:     Goals Addressed             This Visit's Progress    Manage My Medicine       Timeframe:  Long-Range Goal Priority:  Medium Start Date:    01/25/2022                         Expected End Date:   01/26/2024                     Follow Up Date 01/2024   - call for medicine refill 2 or 3 days before it runs out - call if I am sick and can't take my medicine - keep a list of all the medicines I take; vitamins and herbals too - learn to read medicine labels    Why is this important?   These steps will help you keep on track with your medicines.   Notes:       Depression Screen    04/26/2023    9:21 AM 04/10/2023    9:03 AM 11/18/2022    8:50 AM 07/22/2022    9:32 AM 07/22/2022    9:24 AM 05/06/2022    1:42 PM 04/29/2022    4:26 PM  PHQ 2/9 Scores  PHQ - 2 Score 0 0 0 0 0 0 0  PHQ- 9 Score 2   1 0      Fall Risk    04/26/2023    9:21 AM 04/10/2023    9:03 AM 11/18/2022    8:50 AM 07/22/2022    9:24 AM 05/06/2022    1:42 PM  Fall Risk   Falls in the past year? 0 0 0 0 0  Number falls in past yr: 0 0 0 0 0  Injury with Fall? 0 0 0 0 0  Risk for fall due to : No Fall Risks No Fall Risks No Fall Risks  No Fall Risks  Follow up Falls prevention discussed Falls evaluation completed Falls evaluation completed Falls evaluation completed Falls evaluation completed    MEDICARE RISK AT HOME: Medicare Risk at Home Any stairs in or around the home?: Yes If so, are there any without handrails?: No Home free of loose throw rugs in walkways, pet beds, electrical cords, etc?: Yes Adequate lighting in your home to reduce risk of falls?: Yes Life alert?: No Use of a cane, walker or w/c?: No Grab bars in the bathroom?: No Shower chair or bench in shower?: No Elevated toilet seat or a handicapped toilet?: No  TIMED UP AND GO:  Was the test performed?  No    Cognitive Function:        04/26/2023    9:22 AM 04/29/2022    4:29 PM  6CIT Screen  What Year? 0 points 0 points  What month? 0 points 0 points  What time? 0 points 0 points  Count back from 20 0 points 0 points  Months in reverse 0 points 0 points  Repeat phrase 0 points 0 points  Total Score 0 points 0 points     Immunizations Immunization History  Administered Date(s) Administered   Fluad Quad(high Dose 65+) 07/16/2019, 06/19/2020, 06/28/2021, 07/22/2022   Influenza Split 05/15/2012   Influenza Whole 09/03/2009   Influenza, High Dose Seasonal PF 10/11/2016, 06/20/2017, 07/16/2018   Influenza,inj,Quad PF,6+ Mos 06/28/2014, 09/17/2015   PFIZER(Purple Top)SARS-COV-2 Vaccination 11/10/2019, 12/09/2019, 08/27/2020   Pneumococcal Conjugate-13 04/24/2015   Pneumococcal Polysaccharide-23 05/15/2012   Td 11/08/2002   Tdap 06/20/2017   Zoster, Live 04/09/2013    TDAP status: Up to date  Flu Vaccine status: Up to date  Pneumococcal vaccine status: Up to date  Covid-19 vaccine  status: Completed vaccines  Qualifies for Shingles Vaccine? Yes   Zostavax completed Yes   Shingrix Completed?: No.    Education has been provided regarding the importance of this vaccine. Patient has been advised to call insurance company to determine out of pocket expense if they have not yet received this vaccine. Advised may also receive vaccine at local pharmacy or Health Dept. Verbalized acceptance and understanding.  Screening Tests Health Maintenance  Topic Date Due   Zoster Vaccines- Shingrix (1 of 2) 03/19/1988   OPHTHALMOLOGY EXAM  11/22/2018   COVID-19 Vaccine (4 - 2023-24 season) 05/06/2022   INFLUENZA VACCINE  05/19/2023 (Originally 04/06/2023)   MAMMOGRAM  10/08/2023   HEMOGLOBIN A1C  10/22/2023   Diabetic kidney evaluation - Urine ACR  11/18/2023   FOOT EXAM  11/18/2023   Diabetic kidney evaluation - eGFR measurement  04/20/2024   Medicare Annual Wellness (AWV)  04/25/2024   DTaP/Tdap/Td (3 - Td or Tdap) 06/21/2027   Pneumonia Vaccine 23+ Years old  Completed   DEXA SCAN  Completed   HPV VACCINES  Aged Out    Health Maintenance  Health Maintenance Due  Topic Date Due   Zoster Vaccines- Shingrix (1 of 2) 03/19/1988   OPHTHALMOLOGY EXAM  11/22/2018   COVID-19 Vaccine (4 - 2023-24 season)  05/06/2022    Colorectal cancer screening: No longer required.   Mammogram status: Completed 10/07/2022. Repeat every year  Bone Density status: Patient declined.  Lung Cancer Screening: (Low Dose CT Chest recommended if Age 72-80 years, 20 pack-year currently smoking OR have quit w/in 15years.) does not qualify.   Lung Cancer Screening Referral: no  Additional Screening:  Hepatitis C Screening: does not qualify; Completed: no  Vision Screening: Recommended annual ophthalmology exams for early detection of glaucoma and other disorders of the eye. Is the patient up to date with their annual eye exam?  Yes  Who is the provider or what is the name of the office in which the patient attends annual eye exams? [facility] If pt is not established with a provider, would they like to be referred to a provider to establish care? No .   Dental Screening: Recommended annual dental exams for proper oral hygiene  Diabetic Foot Exam: Diabetic Foot Exam: Completed 11/18/2022  Community Resource Referral / Chronic Care Management: CRR required this visit?  No   CCM required this visit?  No     Plan:     I have personally reviewed and noted the following in the patient's chart:   Medical and social history Use of alcohol, tobacco or illicit drugs  Current medications and supplements including opioid prescriptions. Patient is not currently taking opioid prescriptions. Functional ability and status Nutritional status Physical activity Advanced directives List of other physicians Hospitalizations, surgeries, and ER visits in previous 12 months Vitals Screenings to include cognitive, depression, and falls Referrals and appointments  In addition, I have reviewed and discussed with patient certain preventive protocols, quality metrics, and best practice recommendations. A written personalized care plan for preventive services as well as general preventive health recommendations were  provided to patient.     Mickeal Needy, LPN   1/61/0960   After Visit Summary: (Mail) Due to this being a telephonic visit, the after visit summary with patients personalized plan was offered to patient via mail   Nurse Notes: Normal cognitive status assessed by direct observation via telephone conversation by this Nurse Health Advisor. No abnormalities found.

## 2023-05-09 ENCOUNTER — Ambulatory Visit: Payer: Medicare Other | Attending: Internal Medicine

## 2023-05-09 DIAGNOSIS — I421 Obstructive hypertrophic cardiomyopathy: Secondary | ICD-10-CM | POA: Diagnosis not present

## 2023-05-09 DIAGNOSIS — I10 Essential (primary) hypertension: Secondary | ICD-10-CM

## 2023-05-10 ENCOUNTER — Telehealth: Payer: Self-pay

## 2023-05-10 ENCOUNTER — Ambulatory Visit (INDEPENDENT_AMBULATORY_CARE_PROVIDER_SITE_OTHER): Payer: Medicare Other | Admitting: Internal Medicine

## 2023-05-10 ENCOUNTER — Encounter: Payer: Self-pay | Admitting: Internal Medicine

## 2023-05-10 VITALS — BP 160/100 | HR 56 | Temp 97.8°F | Ht 66.0 in | Wt 127.0 lb

## 2023-05-10 DIAGNOSIS — R1031 Right lower quadrant pain: Secondary | ICD-10-CM

## 2023-05-10 DIAGNOSIS — G8929 Other chronic pain: Secondary | ICD-10-CM

## 2023-05-10 DIAGNOSIS — I10 Essential (primary) hypertension: Secondary | ICD-10-CM

## 2023-05-10 LAB — BASIC METABOLIC PANEL
BUN/Creatinine Ratio: 18 (ref 12–28)
BUN: 21 mg/dL (ref 8–27)
CO2: 25 mmol/L (ref 20–29)
Calcium: 9.9 mg/dL (ref 8.7–10.3)
Chloride: 103 mmol/L (ref 96–106)
Creatinine, Ser: 1.18 mg/dL — ABNORMAL HIGH (ref 0.57–1.00)
Glucose: 82 mg/dL (ref 70–99)
Potassium: 4.2 mmol/L (ref 3.5–5.2)
Sodium: 141 mmol/L (ref 134–144)
eGFR: 45 mL/min/{1.73_m2} — ABNORMAL LOW (ref >59)

## 2023-05-10 NOTE — Assessment & Plan Note (Signed)
New worsening acute on chronic RLQ pain needs imaging. Prior kidney stones and could have obstruction. Some new hesitancy with urination. Good BS on exam and no new diarrhea making diverticulitis less likely and no progression making acute appendicitis less likely. Recent CMP will order stat CT renal stone study

## 2023-05-10 NOTE — Assessment & Plan Note (Signed)
BP moderately elevated off hydrochlorothiazide. Will see if cardiology wants to go with hydralazine next or spironolactone. She is already on calcium channel blocker and beta blocker and should not retry thiazide or ACE-I/ARB until renal function stable for longer.

## 2023-05-10 NOTE — Telephone Encounter (Signed)
Pt sys her BP at home has been running 130/70-80 at home but at Dr Frutoso Chase office this morning it was up:    BP 160/100 Important   (BP Location: Right Arm, Patient Position: Sitting, Cuff Size: Normal)   Pulse 56    She said she was having some belly pain at that time but unsure why her BP was so high... she denies any headaches, dizziness, no chest pain.    She says that Dr Okey Dupre was going to get in touch with Dr Tenny Craw.   Dr Okey Dupre note:  BP moderately elevated off hydrochlorothiazide. Will see if cardiology wants to go with hydralazine next or spironolactone. She is already on calcium channel blocker and beta blocker and should not retry thiazide or ACE-I/ARB until renal function stable for longer.       Will forward to Dr Tenny Craw.

## 2023-05-10 NOTE — Telephone Encounter (Signed)
-----   Message from [place] sent at 05/10/2023  1:08 PM EDT ----- Cr is better without hydrochlorothiazide  Now 1.18   How is BP? I would keep on this regimen if BP is good

## 2023-05-10 NOTE — Progress Notes (Signed)
   Subjective:   Patient ID: Monique [name], female    DOB: [birth year], 87 y.o.   MRN: [id number]  HPI The patient is an 87 YO female coming in for RLQ pain. Has had for years but worse in the last few weeks. No diarrhea or constipation. Recently stopped hydrochlorothiazide at advice of cardiology and labs improved but BP up.   Review of Systems  Constitutional: Negative.   HENT: Negative.    Eyes: Negative.   Respiratory:  Negative for cough, chest tightness and shortness of breath.   Cardiovascular:  Negative for chest pain, palpitations and leg swelling.  Gastrointestinal:  Positive for abdominal pain. Negative for abdominal distention, constipation, diarrhea, nausea and vomiting.  Genitourinary:  Positive for difficulty urinating and flank pain.  Skin: Negative.   Neurological: Negative.   Psychiatric/Behavioral: Negative.      Objective:  Physical Exam Constitutional:      Appearance: She is well-developed.  HENT:     Head: Normocephalic and atraumatic.  Cardiovascular:     Rate and Rhythm: Normal rate and regular rhythm.  Pulmonary:     Effort: Pulmonary effort is normal. No respiratory distress.     Breath sounds: Normal breath sounds. No wheezing or rales.  Abdominal:     General: Bowel sounds are normal. There is no distension.     Palpations: Abdomen is soft.     Tenderness: There is abdominal tenderness. There is no rebound.  Musculoskeletal:     Cervical back: Normal range of motion.  Skin:    General: Skin is warm and dry.  Neurological:     Mental Status: She is alert and oriented to person, place, and time.     Coordination: Coordination normal.     Vitals:   05/10/23 1000 05/10/23 1004  BP: (!) 180/100 (!) 160/100  Pulse: (!) 56   Temp: 97.8 F (36.6 C)   TempSrc: Oral   SpO2: 98%   Weight: 127 lb (57.6 kg)   Height: 5\' 6"  (1.676 m)     Assessment & Plan:  Visit time 20 minutes in face to face communication with patient and coordination of  care, additional 10 minutes spent in record review, coordination or care, ordering tests, communicating/referring to other healthcare professionals, documenting in medical records all on the same day of the visit for total time 30 minutes spent on the visit.

## 2023-05-11 ENCOUNTER — Ambulatory Visit
Admission: RE | Admit: 2023-05-11 | Discharge: 2023-05-11 | Disposition: A | Discharge status: Home / Self Care | Payer: Medicare Other | Source: Ambulatory Visit | Attending: Internal Medicine | Admitting: Internal Medicine

## 2023-05-11 ENCOUNTER — Other Ambulatory Visit: Payer: Self-pay | Admitting: Internal Medicine

## 2023-05-11 DIAGNOSIS — R109 Unspecified abdominal pain: Secondary | ICD-10-CM | POA: Diagnosis not present

## 2023-05-11 DIAGNOSIS — R1031 Right lower quadrant pain: Secondary | ICD-10-CM

## 2023-05-11 DIAGNOSIS — I700 Atherosclerosis of aorta: Secondary | ICD-10-CM | POA: Diagnosis not present

## 2023-05-11 DIAGNOSIS — N200 Calculus of kidney: Secondary | ICD-10-CM

## 2023-05-13 NOTE — Telephone Encounter (Signed)
The patient's BP was good when I saw her recently I would keep track of BP at home  Take BP cuff in when she is seen at nexts Dr's appt

## 2023-05-15 NOTE — Telephone Encounter (Signed)
Pt says she has not checked her BP since her PCP appt. Will keep a log this week and I will call he this Friday so she can give me the readings but if she sees that her BP is staying high prior to that she will call me.

## 2023-05-17 NOTE — Telephone Encounter (Signed)
Pt aware forwarding to MD for advisement.

## 2023-05-17 NOTE — Telephone Encounter (Signed)
Pt c/o BP issue: STAT if pt c/o blurred vision, one-sided weakness or slurred speech  1. What are your last 5 BP readings?  05/15/23 125/88 around 12:00 pm 05/16/23 161/96 around 12:00 pm  05/17/23 177/84 around 12:00 pm  All readings prior to lunch  2. Are you having any other symptoms (ex. Dizziness, headache, blurred vision, passed out)? Eyes feel a little watery   3. What is your BP issue? Was advised to record readings and call back to report.

## 2023-05-22 NOTE — Telephone Encounter (Signed)
Per the pt she has not taken since is was D/C'd 10/2022:  Alden Server Dick's note from that time:   Hypertension:  -Patient's blood pressure today was initially elevated at 150/86 and was 142/78 on recheck. -She has osteoporosis and we will discontinue amlodipine and start her on HCTZ 12.5 mg daily -Check BMET in 2 weeks -Continue metoprolol 50 mg twice daily

## 2023-05-22 NOTE — Telephone Encounter (Signed)
Patient had been on amlodipine 2.5 mg in past   Not sure why stopped   Please confirm

## 2023-05-24 NOTE — Telephone Encounter (Signed)
  Will send to the pharm D for review.

## 2023-05-25 NOTE — Telephone Encounter (Signed)
Restart amlodipine   Follow BP

## 2023-05-25 NOTE — Telephone Encounter (Signed)
Providing she was tolerating it, can restart amlodipine

## 2023-05-26 ENCOUNTER — Other Ambulatory Visit: Payer: Self-pay | Admitting: Internal Medicine

## 2023-05-26 MED ORDER — AMLODIPINE BESYLATE 2.5 MG PO TABS: 2.5000 mg | ORAL_TABLET | Freq: Every day | ORAL | 3 refills | Status: DC

## 2023-05-26 NOTE — Telephone Encounter (Signed)
Pt advised an she will let us know how her BP is doing.

## 2023-06-05 ENCOUNTER — Ambulatory Visit (INDEPENDENT_AMBULATORY_CARE_PROVIDER_SITE_OTHER): Payer: Medicare Other | Admitting: Internal Medicine

## 2023-06-05 ENCOUNTER — Encounter: Payer: Self-pay | Admitting: Internal Medicine

## 2023-06-05 VITALS — BP 150/84 | HR 69 | Temp 98.3°F | Ht 66.0 in | Wt 124.0 lb

## 2023-06-05 DIAGNOSIS — I10 Essential (primary) hypertension: Secondary | ICD-10-CM

## 2023-06-05 DIAGNOSIS — M25512 Pain in left shoulder: Secondary | ICD-10-CM

## 2023-06-05 DIAGNOSIS — I5032 Chronic diastolic (congestive) heart failure: Secondary | ICD-10-CM

## 2023-06-05 DIAGNOSIS — G8929 Other chronic pain: Secondary | ICD-10-CM

## 2023-06-05 NOTE — Patient Instructions (Signed)
We have given you the flu shot today and will get you in with sports medicine to check the shoulder.

## 2023-06-05 NOTE — Progress Notes (Unsigned)
Subjective:   Patient ID: Monique [name], female    DOB: 04/02/1936, 87 y.o.   MRN: [id number]  HPI The patient is an 87 YO female coming in for ongoing left breast pain. Mammogram and ultrasound Feb 2024 without cause.  Review of Systems  Objective:  Physical Exam  Vitals:   06/05/23 1603 06/05/23 1607  BP: (!) 160/100 (!) 160/100  Pulse: 69   Temp: 98.3 F (36.8 C)   TempSrc: Oral   SpO2: 95%   Weight: 124 lb (56.2 kg)   Height: 5\' 6"  (1.676 m)     Assessment & Plan:  Flu shot given at visit

## 2023-06-06 ENCOUNTER — Encounter: Payer: Self-pay | Admitting: Internal Medicine

## 2023-06-06 DIAGNOSIS — I5032 Chronic diastolic (congestive) heart failure: Secondary | ICD-10-CM | POA: Insufficient documentation

## 2023-06-06 NOTE — Assessment & Plan Note (Signed)
BP is mildly high today at 150/84. She declines change today. Is taking amlodipine 2.5 mg daily and metoprolol 50 mg BID and norpace 150 mg BID and imdur 15 mg daily. She states she has not taken all meds yet today and states her home readings are better than here. We discussed that high bp readings can cause worsening heart muscle thickness.

## 2023-06-06 NOTE — Assessment & Plan Note (Signed)
Suspect this could be causing some of the sensation of pain in the chest wall. Refer to sports medicine for possible injection in the left shoulder. She has cardiology and recent holter monitoring reviewed and echo from last year and CT angio scan within last 1-2 years without signs of obstructive heart disease.

## 2023-06-06 NOTE — Assessment & Plan Note (Signed)
Taking metoprolol 50 mg BID and is on statin. BP is borderline high today and no flare today.

## 2023-06-07 ENCOUNTER — Ambulatory Visit: Payer: Medicare Other | Admitting: Family Medicine

## 2023-06-07 ENCOUNTER — Ambulatory Visit (INDEPENDENT_AMBULATORY_CARE_PROVIDER_SITE_OTHER): Payer: Medicare Other

## 2023-06-07 ENCOUNTER — Other Ambulatory Visit: Payer: Self-pay

## 2023-06-07 ENCOUNTER — Encounter: Payer: Self-pay | Admitting: Family Medicine

## 2023-06-07 VITALS — BP 136/86 | HR 56 | Ht 66.0 in | Wt 123.0 lb

## 2023-06-07 DIAGNOSIS — M4802 Spinal stenosis, cervical region: Secondary | ICD-10-CM | POA: Diagnosis not present

## 2023-06-07 DIAGNOSIS — M47812 Spondylosis without myelopathy or radiculopathy, cervical region: Secondary | ICD-10-CM | POA: Diagnosis not present

## 2023-06-07 DIAGNOSIS — M8580 Other specified disorders of bone density and structure, unspecified site: Secondary | ICD-10-CM | POA: Diagnosis not present

## 2023-06-07 DIAGNOSIS — M5030 Other cervical disc degeneration, unspecified cervical region: Secondary | ICD-10-CM | POA: Diagnosis not present

## 2023-06-07 DIAGNOSIS — M25512 Pain in left shoulder: Secondary | ICD-10-CM

## 2023-06-07 DIAGNOSIS — M542 Cervicalgia: Secondary | ICD-10-CM | POA: Diagnosis not present

## 2023-06-07 DIAGNOSIS — M19012 Primary osteoarthritis, left shoulder: Secondary | ICD-10-CM | POA: Diagnosis not present

## 2023-06-07 NOTE — Patient Instructions (Signed)
Thank you for coming in today.  Please get an Xray today before you leave  [facility] [phone]

## 2023-06-07 NOTE — Progress Notes (Signed)
I, [name], CMA acting as a scribe for [name], MD.  Monique [name] is a 87 y.o. female who presents to [facility] today for L shoulder pain x 2-3 weeks. Pt locates pain to lateral and posterior shoulder, also c/o pain on the left side of the neck and upper back. Some radiating pain into the upper arm. Denies n/t in the hand, weakness in the arm. Some decreased grip strength. ROM overall well maintained. Takes Tylenol with short-term relief.   Neck pain: yes Radiates: upper arm Aggravates: constant Treatments tried: Tylenol  Pertinent review of systems: No fevers or chills  Relevant historical information: Heart failure.  Diabetes.  Hyperlipidemia. Hypertrophic obstructive cardiomyopathy  Exam:  BP 136/86   Pulse (!) 56   Ht 5\' 6"  (1.676 m)   Wt 123 lb (55.8 kg)   SpO2 99%   BMI 19.85 kg/m  General: Well Developed, well nourished, and in no acute distress.   MSK: C-spine: Normal appearing Nontender palpation cervical midline.  Tender palpation left cervical paraspinal musculature. Decreased cervical motion.  Upper extremity strength reflexes and sensation are intact.  Left shoulder normal-appearing normal motion intact strength negative impingement testing.   Lab and Radiology Results  X-ray images left shoulder and cervical spine obtained today personally and independently interpreted  Cervical spine: DDD C5-6 C6-7.  No acute fractures are visible.  Multilevel facet DJD is present.  Left shoulder: No severe arthritis.  No acute fractures are visible.  Await formal radiology review   Assessment and Plan: 87 y.o. female with chronic left-sided neck and shoulder pain thought to be predominantly neck related.  Cervical paraspinal musculature and trapezius seem to be the main pain generators.  The pain extending to the shoulder is thought to be more neck related and not rotator cuff or shoulder related.  She may have some  component of cervical radiculopathy.  First treatment should be physical therapy.  Plan to refer to PT and check back in about 6 weeks.  Consider adding medications if needed in the future.  Would like to avoid NSAIDs or muscle relaxers if possible.   PDMP not reviewed this encounter. Orders Placed This Encounter  Procedures   [place] LIMITED JOINT SPACE STRUCTURES UP LEFT(NO LINKED CHARGES)    Order Specific Question:   Reason for Exam (SYMPTOM  OR DIAGNOSIS REQUIRED)    Answer:   left shoulder pain    Order Specific Question:   Preferred imaging location?    Answer:   [facility] Cervical Spine 2 or 3 views    Standing Status:   Future    Number of Occurrences:   1    Standing Expiration Date:   06/06/2024    Order Specific Question:   Reason for Exam (SYMPTOM  OR DIAGNOSIS REQUIRED)    Answer:   left-sided neck and left shoulder pain    Order Specific Question:   Preferred imaging location?    Answer:   [facility]   DG Shoulder Left    Standing Status:   Future    Number of Occurrences:   1    Standing Expiration Date:   07/08/2023    Order Specific Question:   Reason for Exam (SYMPTOM  OR DIAGNOSIS REQUIRED)    Answer:   left-sided neck and left shoulder pain    Order Specific Question:   Preferred imaging location?    Answer:   [facility]  Ambulatory referral to Physical Therapy    Referral Priority:   Routine    Referral Type:   Physical Medicine    Referral Reason:   Specialty Services Required    Requested Specialty:   Physical Therapy    Number of Visits Requested:   1   No orders of the defined types were placed in this encounter.    Discussed warning signs or symptoms. Please see discharge instructions. Patient expresses understanding.   The above documentation has been reviewed and is accurate and complete [name], M.D.

## 2023-06-12 NOTE — Therapy (Unsigned)
OUTPATIENT PHYSICAL THERAPY CERVICAL EVALUATION   Patient Name: Monique [name] MRN: [id number] DOB:25-Nov-1935, 87 y.o., female Today's Date: 06/13/2023  END OF SESSION:  PT End of Session - 06/13/23 1104     Visit Number 1    Number of Visits 12    Date for PT Re-Evaluation 09/05/23    Authorization Type UHC - auth required    PT Start Time 1105    PT Stop Time 1145    PT Time Calculation (min) 40 min    Behavior During Therapy [facility] for tasks assessed/performed             Past Medical History:  Diagnosis Date   Abdominal pain 11/18/2015   Acute respiratory failure with hypoxia (HCC) 06/27/2014   Breast pain, left 10/11/2016   Cerebral aneurysm 2000 06/27/2014   Chest pain 10/30/2014   CHF (congestive heart failure) (HCC)    Chronic anticoagulation 06/2014   Coumadin started after bilateral PE   Chronic kidney disease, stage III (moderate) (HCC) 11/21/2014   Clotting disorder (HCC)    right was removed, still have left cataract   Diabetes (HCC)    Dizziness and giddiness 11/28/2014   DVT of lower extremity (deep venous thrombosis) (HCC) 11/28/2014   Dysuria 07/07/2014   Elevated troponin 11/28/2014   Fatty liver    Gastritis 05/16/2012   Chronic dyspepsia on PPI daily.    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   CMR 4/22: EF 72, small amount of patchy LGE in basal septum c/w HCM, asymmetric basal septal hypertrophy (15 mm) c/w HCM, no ischemia, ascending aorta 38 mm   Kidney stones    LVH (left ventricular hypertrophy)    Orthostatic hypotension 11/28/2014   PAF (paroxysmal atrial fibrillation) (HCC) 2000   Palpitations 11/28/2014   PAROXYSMAL ATRIAL FIBRILLATION 05/20/2008   Qualifier: Diagnosis of  By: Debby Bud MD, Rosalyn Gess     PE (pulmonary embolism) 06/2014   Bilateral   Postmenopausal osteoporosis 04/2019   T score -3.3   Pulmonary embolism, bilateral (HCC) 06/27/2014   SOB (shortness of breath) 06/27/2014   Subarachnoid hemorrhage (HCC) 2000   UTI (urinary tract infection) 01/04/2015   Past Surgical History:  Procedure Laterality Date   ABDOMINAL HYSTERECTOMY  2000   Brain hemorrage     2000   CARDIAC CATHETERIZATION  05/2008   Nl cors, no gradient, EF 60%   Patient Active Problem List   Diagnosis Date Noted   Chronic diastolic (congestive) heart failure (HCC) 06/06/2023   Routine general medical examination at a health care facility 05/06/2022   Chronic left shoulder pain 11/04/2021   Osteoporosis 03/19/2021   Muscle spasm 01/02/2020   Thoracic aortic aneurysm without rupture (HCC) 12/11/2019   PSVT (paroxysmal supraventricular tachycardia) (HCC) 08/22/2019   Chest pain 08/21/2019   Blood in stool 01/04/2018   Diabetes mellitus type 2 with complications (HCC) 09/21/2017   Allergic rhinitis 06/20/2017   Headache 10/23/2015   Frequent urination 05/06/2015   Chronic RLQ pain 04/24/2015   Chest tightness 11/28/2014   Chronic kidney disease, stage III (moderate) (HCC) 11/21/2014   Pulmonary embolism, bilateral (HCC) 06/27/2014   Cerebral aneurysm 2000 06/27/2014   Hyperlipidemia associated with type 2 diabetes mellitus (HCC) 05/20/2008  Essential hypertension 05/20/2008   Hypertrophic obstructive cardiomyopathy (HOCM) (HCC) 05/20/2008   PAROXYSMAL ATRIAL FIBRILLATION 05/20/2008   Subarachnoid hemorrhage 2000 05/20/2008    PCP: Myrlene Broker, MD  REFERRING PROVIDER: Rodolph Bong, MD  REFERRING DIAG: [phone] (ICD-10-CM) - Left shoulder pain, unspecified chronicity M54.2 (ICD-10-CM) - Cervicalgia  THERAPY DIAG:  Cervicalgia  Acute pain of left shoulder  Muscle weakness (generalized)  Rationale for Evaluation and Treatment: Rehabilitation  ONSET DATE: about a month  ago  SUBJECTIVE:                                                                                                                                                                                                         SUBJECTIVE STATEMENT: States that her left shoulder started to hurt about a month ago. No pain at rest. States pain comes and goes. She doesn't have to be doing anything for pain to come. States last time it hurt was last week. No numbness or tingling in her hands.       Hand dominance: Right  PERTINENT HISTORY:  DB, heart disease, HTN, osteoporosis, hysterectomy  PAIN:  Are you having pain? Yes: NPRS scale: 8/10 Pain location: left shoulder Pain description: sharp Aggravating factors: unsure Relieving factors: rest and tylenol  PRECAUTIONS: None  RED FLAGS: None     WEIGHT BEARING RESTRICTIONS: No  FALLS:  Has patient fallen in last 6 months? No  LIVING ENVIRONMENT: Lives with: lives alone Lives in: House/apartment Stairs: has stairs but doesn't use them Has following equipment at home: None  OCCUPATION: retired  PLOF: Independent likes to watch game show channel   PATIENT GOALS: to have less pain     OBJECTIVE:  Note: Objective measures were completed at Evaluation unless otherwise noted.  DIAGNOSTIC FINDINGS:  got xrays - awaiting results.  PATIENT SURVEYS:  FOTO 56%  COGNITION: Overall cognitive status: Within functional limits for tasks assessed  SENSATION: Not tested  POSTURE: increased thoracic kyphosis, posterior pelvic tilt, and flexed trunk   PALPATION: Tenderness to palpation along left scapula, infraspinatus, rhomboids and UT.   CERVICAL ROM:   Active ROM A/PROM (deg) eval  Flexion WFL  Extension 15*  Right lateral flexion   Left lateral flexion   Right rotation 40  Left rotation 40   (Blank rows = not tested) *pain in neck    UE Measurements Upper Extremity Right EVAL Left EVAL   A/PROM MMT A/PROM MMT   Shoulder Flexion 115 4- 110 4-  Shoulder Extension      Shoulder Abduction  Shoulder Adduction      Shoulder Internal Rotation/ at 45 degrees abd  Reaches to L5 SP/60 3+ Reaches to L5 SP/20 3+  Shoulder External Rotation/at 45 degrees abd Reaches to T2 SP/ 70 3+ Reaches to T2 SP/40 3+  Elbow Flexion      Elbow Extension      Wrist Flexion      Wrist Extension      Wrist Supination      Wrist Pronation      Wrist Ulnar Deviation      Wrist Radial Deviation      Grip Strength NA 30# NA 35#    (Blank rows = not tested)   * pain   CERVICAL SPECIAL TESTS:  Spurling's test: Negative    TODAY'S TREATMENT:                                                                                                                              DATE:   06/13/2023  Therapeutic Exercise:  Aerobic: Supine: Prone:  Seated: scapular retraction, shoulder ER x20 B 5" holds each  Standing: Neuromuscular Re-education: sitting upright with support - 5 minutes practice and education Manual Therapy: Therapeutic Activity: Self Care: Trigger Point Dry Needling:  Modalities: thermotherapy to cervical spine - felt good  PATIENT EDUCATION:  Education details: on current presentation, on HEP, on clinical outcomes score and POC Person educated: Patient Education method: Explanation, Demonstration, and Handouts Education comprehension: verbalized understanding   HOME EXERCISE PROGRAM: [id number]  ASSESSMENT:  CLINICAL IMPRESSION: Patient presents to physical therapy with complaints of left posterior shoulder pain.  Patient presents with postural limitations including severe thoracic kyphosis and forward head positioning.  Patient also tends to sit in hunched forward position with left shoulder adducted and in internal rotation likely contributing to current presentation.  Educated patient current findings as well as plan of care.  Patient would greatly benefit from skilled PT to improve overall  positioning and quality of life.  OBJECTIVE IMPAIRMENTS: decreased activity tolerance, decreased ROM, decreased strength, improper body mechanics, postural dysfunction, and pain.   ACTIVITY LIMITATIONS: lifting and reach over head  PARTICIPATION LIMITATIONS: meal prep  PERSONAL FACTORS: Age, Fitness, and 1-2 comorbidities: osteoporosis, heart disease  are also affecting patient's functional outcome.   REHAB POTENTIAL: Good  CLINICAL DECISION MAKING: Evolving/moderate complexity  EVALUATION COMPLEXITY: Moderate   GOALS: Goals reviewed with patient? yes  SHORT TERM GOALS: Target date: 07/25/2023  Patient will be independent in self management strategies to improve quality of life and functional outcomes. Baseline: New Program Goal status: INITIAL  2.  Patient will report at least 25 % improvement in overall symptoms and/or function to demonstrate improved functional mobility Baseline: 0% better Goal status: INITIAL     LONG TERM GOALS: Target date: 09/05/2023    Patient will report at least 50 % improvement in overall symptoms and/or function to demonstrate improved functional mobility Baseline: 0% better Goal status: INITIAL  2.  Patient will improve score on FOTO outcomes measure to projected score to demonstrate overall improved function and QOL Baseline: see above Goal status: INITIAL  3.  Patient will report sitting more upright and supported in recliner to reduce stress on shoulder. Baseline: Not currently Goal status: INITIAL      PLAN:  PT FREQUENCY: 1-2x/week for a total of 12 visits over 12 week certification period  PT DURATION: 12 weeks  PLANNED INTERVENTIONS: Therapeutic exercises, Therapeutic activity, Neuromuscular re-education, Balance training, Gait training, Patient/Family education, Self Care, Joint mobilization, Joint manipulation, Stair training, Vestibular training, Canalith repositioning, Orthotic/Fit training, Prosthetic training, DME  instructions, Aquatic Therapy, Dry Needling, Electrical stimulation, Spinal manipulation, Spinal mobilization, Cryotherapy, Moist heat, Taping, Traction, Ultrasound, Ionotophoresis 4mg /ml Dexamethasone, Manual therapy, and Re-evaluation.   PLAN FOR NEXT SESSION: STM, heat, posture/positioning, shoulder ROM    12:15 PM, 06/13/23 Tereasa Coop, DPT Physical Therapy with [facility]

## 2023-06-13 ENCOUNTER — Encounter: Payer: Self-pay | Admitting: Physical Therapy

## 2023-06-13 ENCOUNTER — Ambulatory Visit: Payer: Medicare Other | Admitting: Physical Therapy

## 2023-06-13 DIAGNOSIS — M542 Cervicalgia: Secondary | ICD-10-CM | POA: Diagnosis not present

## 2023-06-13 DIAGNOSIS — M25512 Pain in left shoulder: Secondary | ICD-10-CM | POA: Diagnosis not present

## 2023-06-13 DIAGNOSIS — M6281 Muscle weakness (generalized): Secondary | ICD-10-CM | POA: Diagnosis not present

## 2023-06-16 ENCOUNTER — Other Ambulatory Visit: Payer: Self-pay | Admitting: Nurse Practitioner

## 2023-06-21 ENCOUNTER — Encounter: Payer: Self-pay | Admitting: Physical Therapy

## 2023-06-21 ENCOUNTER — Ambulatory Visit: Payer: Medicare Other | Admitting: Physical Therapy

## 2023-06-21 DIAGNOSIS — M25512 Pain in left shoulder: Secondary | ICD-10-CM

## 2023-06-21 DIAGNOSIS — M542 Cervicalgia: Secondary | ICD-10-CM

## 2023-06-21 DIAGNOSIS — M6281 Muscle weakness (generalized): Secondary | ICD-10-CM

## 2023-06-21 NOTE — Therapy (Addendum)
 OUTPATIENT PHYSICAL THERAPY CERVICAL EVALUATION PHYSICAL THERAPY DISCHARGE SUMMARY  Visits from Start of Care: 1  Current functional level related to goals / functional outcomes: Could not reassess due to unplanned discharged    Remaining deficits: Could not reassess due to unplanned discharged    Education / Equipment: Could not reassess due to unplanned discharged    Patient agrees to discharge. Patient goals were not met. Patient is being discharged due to not returning since the last visit.  10:04 AM, 11/15/23 Tereasa Coop, DPT Physical Therapy with [facility]    Patient Name: Monique [name] MRN: [id number] DOB:Mar 13, 1936, 87 y.o., female Today's Date: 06/21/2023  END OF SESSION:  PT End of Session - 06/21/23 1022     Visit Number 2    Number of Visits 12    Date for PT Re-Evaluation 09/05/23    Authorization Type [employer] - Berkley Harvey requested online    PT Start Time 1022    PT Stop Time 1100    PT Time Calculation (min) 38 min    Behavior During Therapy [facility] for tasks assessed/performed              Past Medical History:  Diagnosis Date   Abdominal pain 11/18/2015   Acute respiratory failure with hypoxia (HCC) 06/27/2014   Breast pain, left 10/11/2016   Cerebral aneurysm 2000 06/27/2014   Chest pain 10/30/2014   CHF (congestive heart failure) (HCC)    Chronic anticoagulation 06/2014   Coumadin started after bilateral PE   Chronic kidney disease, stage III (moderate) (HCC) 11/21/2014   Clotting disorder (HCC)    right was removed, still have left cataract   Diabetes (HCC)    Dizziness and giddiness 11/28/2014   DVT of lower extremity (deep venous thrombosis) (HCC) 11/28/2014   Dysuria 07/07/2014   Elevated troponin 11/28/2014   Fatty liver    Gastritis 05/16/2012   Chronic dyspepsia on PPI daily.    Headache, acute 06/08/2010   Qualifier: Diagnosis of  By: Debby Bud MD, Rosalyn Gess    Heart murmur    Hematochezia 11/18/2015   History of fibromuscular dysplasia  06/27/2014   HTN (hypertension)    Essential   Hyperlipidemia    Hypertrophic obstructive cardiomyopathy (HOCM) (HCC) 05/20/2008   CMR 4/22: EF 72, small amount of patchy LGE in basal septum c/w HCM, asymmetric basal septal hypertrophy (15 mm) c/w HCM, no ischemia, ascending aorta 38 mm   Kidney stones    LVH (left ventricular hypertrophy)    Orthostatic hypotension 11/28/2014   PAF (paroxysmal atrial fibrillation) (HCC) 2000   Palpitations 11/28/2014   PAROXYSMAL ATRIAL FIBRILLATION 05/20/2008   Qualifier: Diagnosis of  By: Debby Bud MD, Rosalyn Gess    PE (pulmonary embolism) 06/2014   Bilateral   Postmenopausal osteoporosis 04/2019   T score -3.3   Pulmonary embolism, bilateral (HCC) 06/27/2014   SOB (shortness of breath) 06/27/2014   Subarachnoid hemorrhage (HCC) 2000   UTI (urinary tract infection) 01/04/2015   Past Surgical History:  Procedure Laterality Date   ABDOMINAL HYSTERECTOMY  2000   Brain hemorrage     2000   CARDIAC CATHETERIZATION  05/2008   Nl cors, no gradient, EF 60%   Patient Active Problem List   Diagnosis Date Noted   Chronic diastolic (congestive) heart failure (HCC) 06/06/2023   Routine general medical examination at a health care facility 05/06/2022   Chronic left shoulder pain 11/04/2021   Osteoporosis 03/19/2021   Muscle spasm 01/02/2020   Thoracic aortic aneurysm without rupture (  HCC) 12/11/2019   PSVT (paroxysmal supraventricular tachycardia) (HCC) 08/22/2019   Chest pain 08/21/2019   Blood in stool 01/04/2018   Diabetes mellitus type 2 with complications (HCC) 09/21/2017   Allergic rhinitis 06/20/2017   Headache 10/23/2015   Frequent urination 05/06/2015   Chronic RLQ pain 04/24/2015   Chest tightness 11/28/2014   Chronic kidney disease, stage III (moderate) (HCC) 11/21/2014   Pulmonary embolism, bilateral (HCC) 06/27/2014   Cerebral aneurysm 2000 06/27/2014   Hyperlipidemia associated with type 2 diabetes mellitus (HCC) 05/20/2008   Essential  hypertension 05/20/2008   Hypertrophic obstructive cardiomyopathy (HOCM) (HCC) 05/20/2008   PAROXYSMAL ATRIAL FIBRILLATION 05/20/2008   Subarachnoid hemorrhage 2000 05/20/2008    PCP: Myrlene Broker, MD  REFERRING PROVIDER: Rodolph Bong, MD  REFERRING DIAG: [phone] (ICD-10-CM) - Left shoulder pain, unspecified chronicity M54.2 (ICD-10-CM) - Cervicalgia  THERAPY DIAG:  Cervicalgia  Acute pain of left shoulder  Muscle weakness (generalized)  Rationale for Evaluation and Treatment: Rehabilitation  ONSET DATE: about a month ago  SUBJECTIVE:                                                                                                                                                                                                         SUBJECTIVE STATEMENT: 06/21/2023 Reports she has no pain and feels good. States she hasn't had any pain since last session. Feels 100% better.   Eval: States that her left shoulder started to hurt about a month ago. No pain at rest. States pain comes and goes. She doesn't have to be doing anything for pain to come. States last time it hurt was last week. No numbness or tingling in her hands.       Hand dominance: Right  PERTINENT HISTORY:  DB, heart disease, HTN, osteoporosis, hysterectomy  PAIN:  Are you having pain? Yes: NPRS scale: 0/10 Pain location: left shoulder Pain description: sharp Aggravating factors: unsure Relieving factors: rest and tylenol  PRECAUTIONS: None  RED FLAGS: None     WEIGHT BEARING RESTRICTIONS: No  FALLS:  Has patient fallen in last 6 months? No  LIVING ENVIRONMENT: Lives with: lives alone Lives in: House/apartment Stairs: has stairs but doesn't use them Has following equipment at home: None  OCCUPATION: retired  PLOF: Independent likes to watch game show channel   PATIENT GOALS: to have less pain     OBJECTIVE:  Note: Objective measures were completed at Evaluation unless  otherwise noted.  DIAGNOSTIC FINDINGS:  got xrays - awaiting results.  PATIENT SURVEYS:  FOTO 56%  COGNITION: Overall cognitive  status: Within functional limits for tasks assessed  SENSATION: Not tested  POSTURE: increased thoracic kyphosis, posterior pelvic tilt, and flexed trunk   PALPATION: Tenderness to palpation along left scapula, infraspinatus, rhomboids and UT.   CERVICAL ROM:   Active ROM A/PROM (deg) 10/16  Flexion WFL  Extension 15  Right lateral flexion   Left lateral flexion   Right rotation 40  Left rotation 40   (Blank rows = not tested) *pain in neck    UE Measurements Upper Extremity Right EVAL Left EVAL   A/PROM MMT A/PROM MMT  Shoulder Flexion 115 4- 110 4-  Shoulder Extension      Shoulder Abduction      Shoulder Adduction      Shoulder Internal Rotation/ at 45 degrees abd  Reaches to L5 SP/60 3+ Reaches to L5 SP/20 3+  Shoulder External Rotation/at 45 degrees abd Reaches to T2 SP/ 70 3+ Reaches to T2 SP/40 3+  Elbow Flexion      Elbow Extension      Wrist Flexion      Wrist Extension      Wrist Supination      Wrist Pronation      Wrist Ulnar Deviation      Wrist Radial Deviation      Grip Strength NA 30# NA 35#    (Blank rows = not tested)   * pain   CERVICAL SPECIAL TESTS:  Spurling's test: Negative    TODAY'S TREATMENT:                                                                                                                              DATE:   06/21/2023  Therapeutic Exercise:  Aerobic: Supine: Prone:  Seated: scapular retraction x20 5" holds , shoulder ER x20 B 5" holds each, shoulder circles x30 B, shoulder abd YTB x20 B, shoulder flexion YTB x20 B, CERVICAL SB x3 20" holds B  Standing: Neuromuscular Re-education:   Manual Therapy: STM to left UT, levator, left shoulder muscles - tolerated well  Therapeutic Activity: Self Care: Trigger Point Dry Needling:  Modalities: thermotherapy to cervical spine and left  shoulder during seated exercises.  - felt good  PATIENT EDUCATION:  Education details: on hep Person educated: Patient Education method: Programmer, multimedia, Demonstration, and Handouts Education comprehension: verbalized understanding   HOME EXERCISE PROGRAM: [id number]  ASSESSMENT:  CLINICAL IMPRESSION: 06/21/2023 Session focused on review of home exercise program as well as progression of exercises.  Verbal and tactile cues throughout.  Modified all exercises for improved patient performance at home without assistance by PT.  No increase in symptoms noted with new exercises.  Tolerated heat well and educated patient in use of heat intermittently to help with soreness and pain in left shoulder region.  Provided patient with printed exercises as well as some yellow Thera-Band for improved home adherence.  Discussed follow-up in 3 to 4 weeks to progress home exercises and follow-up with progress.  Patient would continue to benefit from skilled PT at this time.  Eval: Patient presents to physical therapy with complaints of left posterior shoulder pain.  Patient presents with postural limitations including severe thoracic kyphosis and forward head positioning.  Patient also tends to sit in hunched forward position with left shoulder adducted and in internal rotation likely contributing to current presentation.  Educated patient current findings as well as plan of care.  Patient would greatly benefit from skilled PT to improve overall positioning and quality of life.  OBJECTIVE IMPAIRMENTS: decreased activity tolerance, decreased ROM, decreased strength, improper body mechanics, postural dysfunction, and pain.   ACTIVITY LIMITATIONS: lifting and reach over head  PARTICIPATION LIMITATIONS: meal prep  PERSONAL FACTORS: Age, Fitness, and 1-2 comorbidities: osteoporosis, heart disease  are also affecting patient's functional outcome.   REHAB POTENTIAL: Good  CLINICAL DECISION MAKING: Evolving/moderate  complexity  EVALUATION COMPLEXITY: Moderate   GOALS: Goals reviewed with patient? yes  SHORT TERM GOALS: Target date: 07/25/2023  Patient will be independent in self management strategies to improve quality of life and functional outcomes. Baseline: New Program Goal status: INITIAL  2.  Patient will report at least 25 % improvement in overall symptoms and/or function to demonstrate improved functional mobility Baseline: 0% better Goal status: INITIAL     LONG TERM GOALS: Target date: 09/05/2023    Patient will report at least 50 % improvement in overall symptoms and/or function to demonstrate improved functional mobility Baseline: 0% better Goal status: INITIAL  2.  Patient will improve score on FOTO outcomes measure to projected score to demonstrate overall improved function and QOL Baseline: see above Goal status: INITIAL  3.  Patient will report sitting more upright and supported in recliner to reduce stress on shoulder. Baseline: Not currently Goal status: INITIAL      PLAN:  PT FREQUENCY: 1-2x/week for a total of 12 visits over 12 week certification period  PT DURATION: 12 weeks  PLANNED INTERVENTIONS: Therapeutic exercises, Therapeutic activity, Neuromuscular re-education, Balance training, Gait training, Patient/Family education, Self Care, Joint mobilization, Joint manipulation, Stair training, Vestibular training, Canalith repositioning, Orthotic/Fit training, Prosthetic training, DME instructions, Aquatic Therapy, Dry Needling, Electrical stimulation, Spinal manipulation, Spinal mobilization, Cryotherapy, Moist heat, Taping, Traction, Ultrasound, Ionotophoresis 4mg /ml Dexamethasone, Manual therapy, and Re-evaluation.   PLAN FOR NEXT SESSION: STM, heat, posture/positioning, shoulder ROM    11:35 AM, 06/21/23 Tereasa Coop, DPT Physical Therapy with [facility]

## 2023-06-26 NOTE — Progress Notes (Signed)
Cervical spine x-ray shows arthritis.

## 2023-06-26 NOTE — Progress Notes (Signed)
Left shoulder x-ray shows arthritis especially of the small joint at the top of the shoulder.

## 2023-07-04 ENCOUNTER — Other Ambulatory Visit: Payer: Self-pay | Admitting: Nurse Practitioner

## 2023-07-09 ENCOUNTER — Other Ambulatory Visit: Payer: Self-pay | Admitting: Internal Medicine

## 2023-07-21 DIAGNOSIS — N200 Calculus of kidney: Secondary | ICD-10-CM | POA: Diagnosis not present

## 2023-08-02 ENCOUNTER — Telehealth: Payer: Self-pay | Admitting: Internal Medicine

## 2023-08-02 NOTE — Telephone Encounter (Signed)
   Pt c/o of Chest Pain: STAT if active CP, including tightness, pressure, jaw pain, radiating pain to shoulder/upper arm/back, CP unrelieved by Nitro. Symptoms reported of SOB, nausea, vomiting, sweating.  1. Are you having CP right now? Tightness under left breast   2. Are you experiencing any other symptoms (ex. SOB, nausea, vomiting, sweating)? No, denies all symptoms   3. Is your CP continuous or coming and going? Comes and goes   4. Have you taken Nitroglycerin? no   5. How long have you been experiencing CP? Few days    6. If NO CP at time of call then end call with telling Pt to call back or call 911 if Chest pain returns prior to return call from triage team.

## 2023-08-02 NOTE — Telephone Encounter (Signed)
Patient reports she has had intermittent tightness and achy pain under her left breast for the last "few days." She rates pain 7/10, duration varies, no change in pain with deep breath.Denies any SOB, N/V, or swelling. She states she can feel her heart beating occasionally. Tightness/pain under left breast comes on randomly whether she is up walking around or resting.  Patient has not taken any NTG. Instructed on NTG use and advised on ED evaluation. Patient states she will try using NTG and verbalized understanding to go to ED if no relief after taking second tablet.  Informed patient an on-call provider is available if she has any questions or concerns this week or over the weekend. Will forward message to Dr. Tenny Craw to review and advise.

## 2023-08-04 NOTE — Telephone Encounter (Signed)
Cardiac CTA in 2023 showed normal coronary arteries    I doubt symptoms are due to coronary artery ischemia based on history and this  I would recomm she stay hydrated     Could try 1/2 NTG SL     Watch for dizziness   May not help     Please make sure pt has follow up after Christmas

## 2023-08-07 NOTE — Telephone Encounter (Signed)
Spoke with patient to give provider recommendations. She states she took a whole NTG and it did not help. She will follow up with PCP and give [place] a call back to schedule follow with Dr. Tenny Craw

## 2023-08-08 ENCOUNTER — Ambulatory Visit (INDEPENDENT_AMBULATORY_CARE_PROVIDER_SITE_OTHER): Payer: Medicare Other | Admitting: Internal Medicine

## 2023-08-08 ENCOUNTER — Encounter: Payer: Self-pay | Admitting: Internal Medicine

## 2023-08-08 VITALS — BP 166/100 | HR 60 | Temp 97.6°F | Ht 66.0 in | Wt 128.0 lb

## 2023-08-08 DIAGNOSIS — E118 Type 2 diabetes mellitus with unspecified complications: Secondary | ICD-10-CM

## 2023-08-08 DIAGNOSIS — Z7984 Long term (current) use of oral hypoglycemic drugs: Secondary | ICD-10-CM

## 2023-08-08 DIAGNOSIS — R0789 Other chest pain: Secondary | ICD-10-CM

## 2023-08-08 DIAGNOSIS — I10 Essential (primary) hypertension: Secondary | ICD-10-CM | POA: Diagnosis not present

## 2023-08-08 LAB — COMPREHENSIVE METABOLIC PANEL
ALT: 13 U/L (ref 0–35)
AST: 23 U/L (ref 0–37)
Albumin: 4 g/dL (ref 3.5–5.2)
Alkaline Phosphatase: 74 U/L (ref 39–117)
BUN: 22 mg/dL (ref 6–23)
CO2: 32 meq/L (ref 19–32)
Calcium: 9.3 mg/dL (ref 8.4–10.5)
Chloride: 105 meq/L (ref 96–112)
Creatinine, Ser: 1.22 mg/dL — ABNORMAL HIGH (ref 0.40–1.20)
GFR: 40.5 mL/min — ABNORMAL LOW (ref >60.00)
Glucose, Bld: 146 mg/dL — ABNORMAL HIGH (ref 70–99)
Potassium: 4.1 meq/L (ref 3.5–5.1)
Sodium: 141 meq/L (ref 135–145)
Total Bilirubin: 0.5 mg/dL (ref 0.2–1.2)
Total Protein: 6.9 g/dL (ref 6.0–8.3)

## 2023-08-08 LAB — CBC
HCT: 43.1 % (ref 36.0–46.0)
Hemoglobin: 14 g/dL (ref 12.0–15.0)
MCHC: 32.5 g/dL (ref 30.0–36.0)
MCV: 100.1 fL — ABNORMAL HIGH (ref 78.0–100.0)
Platelets: 155 10*3/uL (ref 150.0–400.0)
RBC: 4.31 Mil/uL (ref 3.87–5.11)
RDW: 13.2 % (ref 11.5–15.5)
WBC: 5.7 10*3/uL (ref 4.0–10.5)

## 2023-08-08 LAB — HEMOGLOBIN A1C: Hgb A1c MFr Bld: 6.7 % — ABNORMAL HIGH (ref 4.6–6.5)

## 2023-08-08 LAB — LIPASE: Lipase: 60 U/L — ABNORMAL HIGH (ref 11.0–59.0)

## 2023-08-08 MED ORDER — LIDOCAINE 5 % EX PTCH: 1.0000 | MEDICATED_PATCH | CUTANEOUS | 11 refills | Status: AC

## 2023-08-08 NOTE — Patient Instructions (Signed)
The EKG is normal. We will check the labs today and have sent in the lidocaine patch to try for the pain.  If this gets worse or does not improve let us know.

## 2023-08-08 NOTE — Progress Notes (Unsigned)
   Subjective:   Patient ID: Monique [name], female    DOB: Sep 27, 1935, 87 y.o.   MRN: [id number]  HPI The patient is an 87 YO female coming in for pain in chest under left breast. Had an episode of this about a year ago which eventually resolved on its own. Started about 2 weeks ago and more random. Not always at rest or exertion. Not affected by food or eating. Has not taken anything for it which has helped.  Review of Systems  Constitutional: Negative.   HENT: Negative.    Eyes: Negative.   Respiratory:  Positive for chest tightness. Negative for cough and shortness of breath.   Cardiovascular:  Positive for chest pain. Negative for palpitations and leg swelling.  Gastrointestinal:  Negative for abdominal distention, abdominal pain, constipation, diarrhea, nausea and vomiting.  Musculoskeletal: Negative.   Skin: Negative.   Neurological: Negative.   Psychiatric/Behavioral: Negative.      Objective:  Physical Exam Constitutional:      Appearance: She is well-developed.  HENT:     Head: Normocephalic and atraumatic.  Cardiovascular:     Rate and Rhythm: Normal rate and regular rhythm.  Pulmonary:     Effort: Pulmonary effort is normal. No respiratory distress.     Breath sounds: Normal breath sounds. No wheezing or rales.     Comments: Some chest wall tenderness to palpation under left breast.  Chest:     Chest wall: Tenderness present.  Abdominal:     General: Bowel sounds are normal. There is no distension.     Palpations: Abdomen is soft.     Tenderness: There is no abdominal tenderness. There is no rebound.  Musculoskeletal:     Cervical back: Normal range of motion.  Skin:    General: Skin is warm and dry.  Neurological:     Mental Status: She is alert and oriented to person, place, and time.     Coordination: Coordination normal.     Vitals:   08/08/23 1004 08/08/23 1009  BP: (!) 166/100 (!) 166/100  Pulse: 60   Temp: 97.6 F (36.4 C)   TempSrc: Oral    SpO2: 98%   Weight: 128 lb (58.1 kg)   Height: 5\' 6"  (1.676 m)    EKG: Rate 53, axis left, interval normal, sinus, likely LVH no st or t wave changes, no significant change compared to prior 01/2023  Assessment & Plan:

## 2023-08-09 NOTE — Assessment & Plan Note (Signed)
Due for Hga1c so checking today and adjust metformin 500 mg daily as needed. Is on statin.

## 2023-08-09 NOTE — Assessment & Plan Note (Addendum)
Pain is atypical and EKG done which is unchanged from prior. Checking CBC and CMP and lipase given location of pain. Rx lidocaine 5% patches as those were helpful last year. It is possibly  a musculoskeletal etiology. She did try nitroglycerin per cardiology suggestion but this did not help the pain.

## 2023-08-09 NOTE — Assessment & Plan Note (Signed)
BP is high today and unclear etiology. May be related to pain. She has not taken BP meds this morning yet.

## 2023-08-21 ENCOUNTER — Ambulatory Visit (INDEPENDENT_AMBULATORY_CARE_PROVIDER_SITE_OTHER): Payer: Medicare Other | Admitting: Internal Medicine

## 2023-08-21 ENCOUNTER — Encounter: Payer: Self-pay | Admitting: Internal Medicine

## 2023-08-21 VITALS — BP 140/80 | HR 65 | Temp 98.6°F | Ht 66.0 in | Wt 128.0 lb

## 2023-08-21 DIAGNOSIS — R0789 Other chest pain: Secondary | ICD-10-CM

## 2023-08-21 MED ORDER — PREDNISONE 20 MG PO TABS: 40.0000 mg | ORAL_TABLET | Freq: Every day | ORAL | 0 refills | Status: DC

## 2023-08-21 NOTE — Progress Notes (Signed)
   Subjective:   Patient ID: Monique [name], female    DOB: [birth year], 87 y.o.   MRN: [id number]  HPI The patient is an 87 YO female coming in for ongoing left breast/chest wall pain. No change from prior. Tried lidocaine patches and they help some.  Review of Systems  Constitutional: Negative.   HENT: Negative.    Eyes: Negative.   Respiratory:  Negative for cough, chest tightness and shortness of breath.   Cardiovascular:  Positive for chest pain. Negative for palpitations and leg swelling.  Gastrointestinal:  Negative for abdominal distention, abdominal pain, constipation, diarrhea, nausea and vomiting.  Musculoskeletal: Negative.   Skin: Negative.   Neurological: Negative.   Psychiatric/Behavioral: Negative.      Objective:  Physical Exam Constitutional:      Appearance: She is well-developed.  HENT:     Head: Normocephalic and atraumatic.  Cardiovascular:     Rate and Rhythm: Normal rate and regular rhythm.  Pulmonary:     Effort: Pulmonary effort is normal. No respiratory distress.     Breath sounds: Normal breath sounds. No wheezing or rales.  Chest:     Chest wall: Tenderness present.  Abdominal:     General: Bowel sounds are normal. There is no distension.     Palpations: Abdomen is soft.     Tenderness: There is no abdominal tenderness. There is no rebound.  Musculoskeletal:     Cervical back: Normal range of motion.  Skin:    General: Skin is warm and dry.  Neurological:     Mental Status: She is alert and oriented to person, place, and time.     Coordination: Coordination normal.     Vitals:   08/21/23 1538 08/21/23 1544  BP: (!) 140/80 (!) 140/80  Pulse: 65   Temp: 98.6 F (37 C)   TempSrc: Oral   SpO2: 98%   Weight: 128 lb (58.1 kg)   Height: 5\' 6"  (1.676 m)     Assessment & Plan:

## 2023-08-21 NOTE — Patient Instructions (Signed)
We have sent in the prednisone to take 2 pills daily for 5 days.   

## 2023-08-22 ENCOUNTER — Encounter: Payer: Self-pay | Admitting: Internal Medicine

## 2023-08-22 NOTE — Assessment & Plan Note (Signed)
Rx prednisone 5 day course to see if this helps and continue using lidocaine patches to help with pain.

## 2023-09-25 ENCOUNTER — Ambulatory Visit: Payer: Self-pay | Admitting: Internal Medicine

## 2023-09-25 NOTE — Telephone Encounter (Signed)
Copied from CRM [id number]. Topic: Clinical - Red Word Triage >> Sep 25, 2023  2:08 PM Melissa C wrote: Red Word that prompted transfer to Nurse Triage: patient fell last night and hurt right side badly. Cannot really get up very well today and in a lot of pain   Chief Complaint: Fall Symptoms: Back pain, mild weakness Pertinent Negatives: Patient denies dizziness Disposition: [] ED /[] Urgent Care (no appt availability in office) / [x] Appointment(In office/virtual)/ []  [facility] [place] [facility]/ [] Home Care/ [] Refused Recommended Disposition /[] [facility]/ []  Follow-up with PCP  Additional Notes: Patient stated that she fell last night. She stood up and lost her balance, causing her to fall backward. Patient stated she hit her back and her left leg. Patient denies hitting her head. She has not noticed any bruising or cuts. Patient stated she has mild weakness. Patient has some back pain. She stated her pain level is 5/10 right now, but it can get worse with activity. She is using a Lidocaine patch which isn't helping much. Patient is still able to stand and walk okay. Offered same day appointment. Patient declined due to no transportation. Appointment scheduled for 1/21. Advised patient to notify the office any worsening symptoms in the meantime.   Reason for Disposition  MILD weakness (i.e., does not interfere with ability to work, go to school, normal activities)  (Exception: Mild weakness is a chronic symptom.)  Answer Assessment - Initial Assessment Questions 1. MECHANISM: "How did the fall happen?"     Got up and fell backward, loss of balance  2. ONSET: "When did the fall happen?" (e.g., minutes, hours, or days ago)     Yesterday  3. LOCATION: "What part of the body hit the ground?" (e.g., back, buttocks, head, hips, knees, hands, head, stomach)     Back. Patient stated the left side of her upper back also hit a candle holder.  4. PAIN: "Is there any pain?" If Yes, ask: "How  bad is the pain?" (e.g., Scale 1-10; or mild,  moderate, severe)   - NONE (0): No pain   - MILD (1-3): Doesn't interfere with normal activities    - MODERATE (4-7): Interferes with normal activities or awakens from sleep    - SEVERE (8-10): Excruciating pain, unable to do any normal activities    Pain in left side of back. Patient stated pain is 8/10 if she is moving around. It is about 5/10 at rest.   5. SIZE: For cuts, bruises, or swelling, ask: "How large is it?" (e.g., inches or centimeters)      Patient has not noticed any cuts or bruising. She put a lidocaine patch on her back.  6. OTHER SYMPTOMS: "Do you have any other symptoms?" (e.g., dizziness, fever, weakness; new onset or worsening).      Still able to walk and do usual activities.  7. CAUSE: "What do you think caused the fall (or falling)?" (e.g., tripped, dizzy spell)       Just lost balance  Protocols used: [facility]

## 2023-09-26 ENCOUNTER — Ambulatory Visit (INDEPENDENT_AMBULATORY_CARE_PROVIDER_SITE_OTHER): Payer: Medicare Other | Admitting: Internal Medicine

## 2023-09-26 ENCOUNTER — Encounter: Payer: Self-pay | Admitting: Internal Medicine

## 2023-09-26 VITALS — BP 164/100 | HR 56 | Temp 97.8°F | Ht 66.0 in | Wt 127.6 lb

## 2023-09-26 DIAGNOSIS — I10 Essential (primary) hypertension: Secondary | ICD-10-CM

## 2023-09-26 DIAGNOSIS — M62838 Other muscle spasm: Secondary | ICD-10-CM | POA: Diagnosis not present

## 2023-09-26 NOTE — Progress Notes (Signed)
   Subjective:   Patient ID: Monique [name], female    DOB: 02/09/1936, 88 y.o.   MRN: [id number]  HPI The patient is an 88 YO female coming in after a fall Sunday. Monique [name] hit left left side and hand. Denies pain in the hand but there is a bruise. Monique [name] is unsure how Monique [name] got tripped up. Denies LOC. No headaches or vision changes or confusion since fall. Yesterday pain worse today improving slightly. Does not hurt to breathe but does hurt to strain to stand up.   Review of Systems  Constitutional: Negative.   HENT: Negative.    Eyes: Negative.   Respiratory:  Negative for cough, chest tightness and shortness of breath.   Cardiovascular:  Negative for chest pain, palpitations and leg swelling.  Gastrointestinal:  Negative for abdominal distention, abdominal pain, constipation, diarrhea, nausea and vomiting.  Musculoskeletal:  Positive for arthralgias and myalgias.  Skin: Negative.   Neurological: Negative.   Psychiatric/Behavioral: Negative.      Objective:  Physical Exam Constitutional:      Appearance: Monique [name] is well-developed.  HENT:     Head: Normocephalic and atraumatic.  Cardiovascular:     Rate and Rhythm: Normal rate and regular rhythm.  Pulmonary:     Effort: Pulmonary effort is normal. No respiratory distress.     Breath sounds: Normal breath sounds. No wheezing or rales.  Abdominal:     General: Bowel sounds are normal. There is no distension.     Palpations: Abdomen is soft.     Tenderness: There is no abdominal tenderness. There is no rebound.  Musculoskeletal:        General: Tenderness present.     Cervical back: Normal range of motion.     Comments: Pain left upper back around scapular region no tenderness or deformity over any of the ribs.   Skin:    General: Skin is warm and dry.     Comments: Bruise left thumb without pain in the hand, fingers, wrist, full ROM.  Neurological:     Mental Status: Monique [name] is alert and oriented to person, place, and time.      Coordination: Coordination normal.     Vitals:   09/26/23 1247 09/26/23 1249  BP: (!) 154/100 (!) 164/100  Pulse: (!) 56   Temp: 97.8 F (36.6 C)   TempSrc: Oral   SpO2: 96%   Weight: 127 lb 9.6 oz (57.9 kg)   Height: 5\' 6"  (1.676 m)     Assessment & Plan:

## 2023-09-26 NOTE — Assessment & Plan Note (Signed)
BP elevated slightly today likely due to pain. Was 140/80 this morning. Will not adjust regimen.

## 2023-09-26 NOTE — Assessment & Plan Note (Addendum)
Left flank pain which is worse with twisting/bending. No worse with breathing. Unlikely rib fracture as not worse with breathing and would not change management (definitely not complex fracture) so no chest x-ray needed.

## 2023-09-27 NOTE — Telephone Encounter (Signed)
I have already seen patient in office. Is there a question for me here?

## 2023-10-03 ENCOUNTER — Other Ambulatory Visit: Payer: Self-pay | Admitting: Nurse Practitioner

## 2023-10-04 ENCOUNTER — Other Ambulatory Visit: Payer: Self-pay | Admitting: Internal Medicine

## 2023-10-04 DIAGNOSIS — I480 Paroxysmal atrial fibrillation: Secondary | ICD-10-CM

## 2023-10-04 DIAGNOSIS — I2699 Other pulmonary embolism without acute cor pulmonale: Secondary | ICD-10-CM

## 2023-10-04 MED ORDER — ISOSORBIDE MONONITRATE ER 30 MG PO TB24: ORAL_TABLET | ORAL | 2 refills | Status: DC

## 2023-10-04 NOTE — Telephone Encounter (Signed)
Xarelto 15mg  refill request received. Pt is 88 years old, weight-57.9kg, Crea-1.22 on 08/08/23, last seen by Dr. Tenny Craw on 04/21/23, Diagnosis-Afib, DVT & PE, CrCl-30.82 mL/min; Dose is appropriate based on dosing criteria. Will send in refill to requested pharmacy.

## 2023-10-17 ENCOUNTER — Other Ambulatory Visit: Payer: Self-pay | Admitting: Internal Medicine

## 2023-10-17 DIAGNOSIS — R072 Precordial pain: Secondary | ICD-10-CM

## 2023-10-30 ENCOUNTER — Telehealth: Payer: Self-pay | Admitting: Internal Medicine

## 2023-10-30 MED ORDER — AMLODIPINE BESYLATE 5 MG PO TABS: 2.5000 mg | ORAL_TABLET | Freq: Every day | ORAL | 3 refills | Status: DC

## 2023-10-30 NOTE — Telephone Encounter (Signed)
 Spoke with pt and explained that Dr. Izora Ribas (DOD 2/24) advised we increase Amlodipine to 5 mg once daily to help with elevated BP and see if that helps with CP. Pt has appt with Robet Leu, PA on 3/5. Prescription for Amlodipine 5 mg ordered and pt is aware.

## 2023-10-30 NOTE — Telephone Encounter (Signed)
 Last night patient experience pain of under her left breast. Calling in to see what she needs to do. Please advise

## 2023-11-03 NOTE — Progress Notes (Signed)
 Cardiology Office Note:  .   Date:  11/08/2023  ID:  Monique [name], DOB [birth year], MRN [id number] PCP: Myrlene Broker, MD  [facility] Providers Cardiologist:  Dietrich Pates, MD   History of Present Illness: .   Monique [name] is a 88 y.o. female with past medical history of HCM, hypertension, SVT, hyperlipidemia, paroxysmal atrial fibrillation, history of bilateral PE/DVT, CKD.  She is followed by Dr. Tenny Craw, presents today for evaluation of chest pain.  Per chart review, patient previously underwent nuclear stress test on 09/08/17 that was a low risk study with normal perfusion, normal LV regional and global systolic function.  Echocardiogram on 09/09/17 showed EF 65-70%, moderate LVH, peak gradient 34 mmHg, grade 1 DD.  Later on 09/04/2019, patient underwent stress test that was again a low risk study with normal wall motion, EF 65%.  Echocardiogram in 09/2019 showed EF 65-70%, mildly increased LVH, grade 2 DD, no regional wall motion abnormalities, normal RV systolic function.   On 12/31/2020, patient underwent stress cardiac MRI that showed no evidence of ischemia on stress perfusion imaging.  There was asymmetric hypertrophy of basal septum measuring up to 15 mm, meeting criteria for hypertrophic cardiomyopathy.  There was also a small amount of patchy LGE in the basal septum consistent with HCM. Coronary CTA from 12/21/21 that showed a coronary calcium score of 0, no evidence of CAD. Long term monitor in 01/2023 showed predominantly NSR with 15 episodes of SVT, longest lasted 16 beats.   Patient was last seen by cardiology on 04/21/23. At that time, patient denied chest pain, shortness of breath, or palpitations. She had some RLQ pain. Recommended follow up with PCP. Remained on BB, xarelto, lipitor.   Today, patient presents for evaluation of chest, back, and shoulder pain. The chest pain is described as a nagging pain that is not associated with shortness of breath. Pain only occurs  when she is laying in bed. Pain is somewhat vague, and patient has a difficult time describing her symptoms. Pain is felt when the patient lies in certain positions and tries to roll over in bed. Pain is relieved when patient repositions herself. Today, pain is reproducible on palpation of the sternum. The patient does not experience pain when active, such as when doing housework. The patient has tried nitroglycerin for the pain, but it did not help. However, taking Imdur seemed to alleviate the pain somewhat. The patient denies any fluttering or pounding in the chest, dizziness, or feeling like she is going to pass out   ROS: Patient reports occasional chest pain, left shoulder pain, and upper back pain. No shortness of breath, palpitations, syncope, near syncope.   Studies Reviewed: .   Cardiac Studies & Procedures   ______________________________________________________________________________________________   STRESS TESTS  MYOCARDIAL PERFUSION IMAGING 09/04/2019  Narrative  The left ventricular ejection fraction is normal (55-65%).  Nuclear stress EF: 65%.  No T wave inversion was noted during stress.  There was no ST segment deviation noted during stress.  This is a low risk study.  Normal perfusion. LVEF 65% with normal wall motion. This is a low risk study.   ECHOCARDIOGRAM  ECHOCARDIOGRAM COMPLETE 09/16/2019  Narrative ECHOCARDIOGRAM REPORT    Patient Name:   Monique [name] Date of Exam: 09/16/2019 Medical Rec #:  [id number]        Height:       66.0 in Accession #:    [id number]       Weight:  145.0 lb Date of Birth:  May 19, 1936        BSA:          1.74 m Patient Age:    83 years         BP:           122/80 mmHg Patient Gender: F                HR:           62 bpm. Exam Location:  [address]  Procedure: 2D Echo, Cardiac Doppler and Color Doppler  Indications:    R07.9 Chest pain  History:        Patient has prior history of Echocardiogram  examinations, most recent 09/09/2017. Risk Factors:Dyslipidemia, Diabetes and Hypertension. Pulmonary embolism. Palpitations. Shortness of breath. Atrial fibrillation. CHF. DVT. Murmur.  Sonographer:    NaTashia Rodgers-Pryce RDCS Referring Phys: [id number] LORI C GERHARDT  IMPRESSIONS   1. Left ventricular ejection fraction, by visual estimation, is 65 to 70%. The left ventricle has normal function. There is mildly increased left ventricular hypertrophy. 2. Elevated left atrial pressure. 3. Left ventricular diastolic parameters are consistent with Grade II diastolic dysfunction (pseudonormalization). 4. The left ventricle has no regional wall motion abnormalities. 5. Intracavitary gradient up to 5 mmHG. 6. Global right ventricle has normal systolic function.The right ventricular size is normal. No increase in right ventricular wall thickness. 7. Left atrial size was mildly dilated. 8. The mitral valve is grossly normal. Trivial mitral valve regurgitation. 9. The tricuspid valve is grossly normal. 10. The aortic valve is tricuspid. Aortic valve regurgitation is not visualized. No evidence of aortic valve sclerosis or stenosis. 11. Mildly elevated pulmonary artery systolic pressure. 12. The tricuspid regurgitant velocity is 3.00 m/s, and with an assumed right atrial pressure of 10 mmHg, the estimated right ventricular systolic pressure is mildly elevated at 45.9 mmHg.  In comparison to the previous echocardiogram(s): EF remains unchanged on this study. Intracavitary gradient ~5 mmHG on the present study, and noted to be up to 34 mmHG on the prior study. No obvious asymmetric hypertrophy or SAM on this study. Compared to 09/08/2017. FINDINGS Left Ventricle: Left ventricular ejection fraction, by visual estimation, is 65 to 70%. The left ventricle has normal function. The left ventricle has no regional wall motion abnormalities. The left ventricular internal cavity size was the left ventricle is normal  in size. There is mildly increased left ventricular hypertrophy. Concentric left ventricular hypertrophy. Left ventricular diastolic parameters are consistent with Grade II diastolic dysfunction (pseudonormalization). Elevated left atrial pressure. Intracavitary gradient up to 5 mmHG.  Right Ventricle: The right ventricular size is normal. No increase in right ventricular wall thickness. Global RV systolic function is has normal systolic function. The tricuspid regurgitant velocity is 3.00 m/s, and with an assumed right atrial pressure of 10 mmHg, the estimated right ventricular systolic pressure is mildly elevated at 45.9 mmHg.  Left Atrium: Left atrial size was mildly dilated.  Right Atrium: Right atrial size was normal in size  Pericardium: There is no evidence of pericardial effusion. Presence of pericardial fat pad.  Mitral Valve: The mitral valve is grossly normal. Trivial mitral valve regurgitation.  Tricuspid Valve: The tricuspid valve is grossly normal. Tricuspid valve regurgitation is mild.  Aortic Valve: The aortic valve is tricuspid. Aortic valve regurgitation is not visualized. The aortic valve is structurally normal, with no evidence of sclerosis or stenosis.  Pulmonic Valve: The pulmonic valve was grossly normal. Pulmonic valve regurgitation is not  visualized. Pulmonic regurgitation is not visualized.  Aorta: The aortic root and ascending aorta are structurally normal, with no evidence of dilitation.  IAS/Shunts: No atrial level shunt detected by color flow Doppler.   LEFT VENTRICLE PLAX 2D LVIDd:         4.00 cm  Diastology LVIDs:         2.50 cm  LV e' lateral:   11.00 cm/s LV PW:         1.10 cm  LV E/e' lateral: 5.8 LV IVS:        1.30 cm  LV e' medial:    5.11 cm/s LVOT diam:     1.80 cm  LV E/e' medial:  12.5 LV SV:         48 ml LV SV Index:   27.16 LVOT Area:     2.54 cm   RIGHT VENTRICLE RV Basal diam:  4.30 cm RV S prime:     11.70 cm/s TAPSE  (M-mode): 1.8 cm  LEFT ATRIUM             Index       RIGHT ATRIUM           Index LA diam:        3.80 cm 2.18 cm/m  RA Area:     15.00 cm LA Vol (A2C):   71.5 ml 40.99 ml/m RA Volume:   40.10 ml  22.99 ml/m LA Vol (A4C):   59.3 ml 34.00 ml/m LA Biplane Vol: 64.6 ml 37.03 ml/m AORTIC VALVE LVOT Vmax:   141.00 cm/s LVOT Vmean:  115.000 cm/s LVOT VTI:    0.365 m  AORTA Ao Root diam: 3.50 cm Ao Asc diam:  4.00 cm  MITRAL VALVE                        TRICUSPID VALVE MV Area (PHT): 2.32 cm             TR Peak grad:   35.9 mmHg MV PHT:        94.83 msec           TR Vmax:        335.00 cm/s MV Decel Time: 327 msec MV E velocity: 63.80 cm/s 103 cm/s  SHUNTS MV A velocity: 60.80 cm/s 70.3 cm/s Systemic VTI:  0.36 m MV E/A ratio:  1.05       1.5       Systemic Diam: 1.80 cm   Lennie Odor MD Electronically signed by Lennie Odor MD Signature Date/Time: 09/16/2019/4:12:56 PM    Final    MONITORS  LONG TERM MONITOR (3-14 DAYS) 01/31/2023  Narrative Patch Wear Time:  13 days and 23 hours (2024-05-06T14:10:01-399 to 2024-05-20T14:10:09-398)  Predominant rhythm:  Sinus rhythm   Rates 41 to 87 bpm  Average HR 56 bpm Rare PVC, PAC. Short burst VT (4 beats at 143 bpm.   15 episodes of SVT, fasted for 6 beats at 156 bpm; longest SVT for 16 beats at 123 bpm  Triggered events/ diary entries corresponded to sinus rhythm  _______________________________________________________________________________________________________________________  Patient had a min HR of 41 bpm, max HR of 156 bpm, and avg HR of 56 bpm. Predominant underlying rhythm was Sinus Rhythm. 1 run of Ventricular Tachycardia occurred lasting 4 beats with a max rate of 143 bpm (avg 121 bpm). 15 Supraventricular Tachycardia runs occurred, the run with the fastest interval lasting 6 beats with a max rate of 156 bpm, the  longest lasting 16 beats with an avg rate of 123 bpm. Isolated SVEs were rare (<1.0%), SVE  Couplets were rare (<1.0%), and SVE Triplets were rare (<1.0%). Isolated VEs were rare (<1.0%, 1298), VE Couplets were rare (<1.0%, 8), and VE Triplets were rare (<1.0%, 2). Ventricular Trigeminy was present.   CT SCANS  CT CORONARY MORPH W/CTA COR W/SCORE 12/21/2021  Addendum 12/21/2021 12:58 PM ADDENDUM REPORT: 12/21/2021 12:56  CLINICAL DATA:  This is a 88 year old female with anginal symptoms.  EXAM: Cardiac/Coronary  CTA  TECHNIQUE: The patient was scanned on a [employer].  FINDINGS: A 100 kV prospective scan was triggered in the descending thoracic aorta at 111 HU's. Axial non-contrast 3 mm slices were carried out through the heart. The data set was analyzed on a dedicated work station and scored using the Agatson method. Gantry rotation speed was 250 msecs and collimation was .6 mm. No beta blockade and 0.8 mg of sl NTG was given. The 3D data set was reconstructed in 5% intervals of the 67-82 % of the R-R cycle. Diastolic phases were analyzed on a dedicated work station using MPR, MIP and VRT modes. The patient received 80 cc of contrast.  Aorta: Normal size.  No calcifications.  No dissection.  Aortic Valve:  Trileaflet.  No calcifications.  Coronary Arteries:  Normal coronary origin.  Right dominance.  RCA is a large dominant artery that gives rise to PDA and PLA. There is no plaque.  Left main is a large artery that gives rise to LAD and LCX arteries.  LAD is a large vessel that has no plaque.  LCX is a non-dominant artery that gives rise to one large OM1 branch. There is no plaque.  Coronary Calcium Score:  Left main: 0  Left anterior descending artery: 0  Left circumflex artery: 0  Right coronary artery: 0  Total: 0  Percentile: 0  Other findings:  Normal pulmonary vein drainage into the left atrium.  Normal left atrial appendage without a thrombus.  Normal size of the pulmonary artery.  IMPRESSION: 1. Coronary calcium score of  0. This was 0 percentile for age and sex matched control.  2. Normal coronary origin with right dominance.  3. CAD-RADS 0. No evidence of CAD (0%). Consider non-atherosclerotic causes of chest pain.  The noncardiac portion of this study will be interpreted in separate report by the radiologist.   Electronically Signed By: Thomasene Ripple D.O. On: 12/21/2021 12:56  Narrative EXAM: OVER-READ INTERPRETATION  PET-CT CHEST  The following report is an over-read performed by radiologist Dr. Leatha Gilding [facility] on 12/21/2021. This over-read does not include interpretation of cardiac or coronary anatomy or pathology. The cardiac CT interpretation by the cardiologist is to be attached.  COMPARISON:  None.  FINDINGS: Multiple pulmonary nodules are identified including 3 mm right middle lobe nodule on 64/2 and a 4 mm right lower lobe nodule on the same image. Peripheral tiny nodules measuring 2-3 mm identified posterior left lower lobe in the costophrenic sulcus.  The visualized portions of the mediastinum and chest wall are unremarkable.  IMPRESSION: Multiple tiny bilateral pulmonary nodules measuring up to 4 mm. No follow-up needed if patient is low-risk (and has no known or suspected primary neoplasm). Non-contrast chest CT can be considered in 12 months if patient is high-risk. This recommendation follows the consensus statement: Guidelines for Management of Incidental Pulmonary Nodules Detected on CT Images: From the Fleischner Society 2017; Radiology 2017; 284:228-243.  Electronically Signed:  By: Kennith Center M.D. On: 12/21/2021 10:46     ______________________________________________________________________________________________      Risk Assessment/Calculations:    CHA2DS2-VASc Score = 6   This indicates a 9.7% annual risk of stroke. The patient's score is based upon: CHF History: 1 HTN History: 1 Diabetes History: 0 Stroke History:  0 Vascular Disease History: 1 Age Score: 2 Gender Score: 1  HYPERTENSION CONTROL Vitals:   11/08/23 1320 11/08/23 1402  BP: (!) 156/80 (!) 158/82    The patient's blood pressure is elevated above target today.  In order to address the patient's elevated BP: A current anti-hypertensive medication was adjusted today.          Physical Exam:   VS:  BP (!) 158/82   Pulse 60   Ht 5\' 6"  (1.676 m)   Wt 129 lb (58.5 kg)   SpO2 96%   BMI 20.82 kg/m    Wt Readings from Last 3 Encounters:  11/08/23 129 lb (58.5 kg)  09/26/23 127 lb 9.6 oz (57.9 kg)  08/21/23 128 lb (58.1 kg)    GEN: Well nourished, well developed in no acute distress. Sitting comfortably in the exam chair  NECK: No JVD CARDIAC: RRR, no murmurs, rubs, gallops. Radial pulses 2+ bilaterally. Tender to palpation over the sternum  RESPIRATORY:  Clear to auscultation without rales, wheezing or rhonchi. Normal WOB on room air  ABDOMEN: Soft, non-tender, non-distended EXTREMITIES:  Trace edema in BLE; No deformity   ASSESSMENT AND PLAN: .    Chest Pain  - Patient has had multiple stress tests in the past. Stress MRI in 12/2020 showed no evidence of ischemia. Coronary CTA in 12/2021 showed a coronary calcium score of 0, no CAD  -Patient reports that she has been having chest pain in the middle of her chest, in her left shoulder, and in her upper back for a few days.  Pain occurs when she rolls over in bed to lay on her side.  After she rests for a few moments and adjusts her positioning, the pain subsides. Pain only occurs when she is in bed. She is able to do housework and walk around her home without getting chest pain or dyspnea on exertion.  Pain was not relieved by nitroglycerin - on exam today, chest pain is reproducible on palpation over her sternum -As pain is reproducible on palpation and triggered by rolling over/laying in a certain position, discussed that her pain is likely musculoskeletal.  It is reassuring that she  is able to do housework and walk around her home without getting chest pain - We reviewed her coronary CTA from 12/2021, which was also a reassuring study  - No further ischemic evaluation indicated at this time   HTN  - Currently on amlodipine 5 mg daily, imdur 15 mg daily, metoprolol tartrate 50 mg BID  - BP elevated to 158/82 in clinic today. She reports that whenever she checks her BP at home, it is well controlled. Was reportedly 117/70 this AM. - Per review of vital signs during her recent visits, BP has been elevated to the 140s-160s systolic. Her PCP recently increased her amlodipine to 5 mg daily  - Increase imdur to 30 mg daily. With patient's advanced age, I am hesitant to make bigger changes to her BP regiment as this could cause orthostatic hypotension  - Instructed patient to keep a BP log for 2 weeks - Arranged follow up in 4 weeks for further medication titration   HOCM  -  Stress cMRI in 12/2020 showed asymmetric hypertrophy of basal septum measuring up to 15 mm, meeting crieteria for hypertrophic cardiomyopathy, small amount of patchy LGE in the basal septum consistent with HCM  - Patient denies syncope/near syncope. I am not able to hear a murmur on exam - Continue metoprolol tartrate 50 mg BID - Ordered echocardiogram to monitor ascending thoracic aortic aneurysm as below   SVT - Noted on cardiac monitor from 01/2023  - Patient denies palpitations  - Continue metoprolol tartrate 50 mg BID   PAF - Patient denies palpitations. HR is regular on exam today  - Continue metoprolol tartrate 50 mg BID - Continue xarelto 15 mg daily - creatinine clearance 68mL/min. Patient denies bruising or bleeding on xarelto   HLD  - Lipid panel from 11/2022 showed LDL 51, HDL 70, triglycerides 83, total cholesterol 138  - Continue lipitor 10 mg daily   Ascending Thoracic Aortic Aneurysm - CTA chest from 12/2021 showed stable, dilated 4.1 cm ascending thoracic aorta. Recommended annual  imaging follow up - Ordered echocardiogram for monitoring   History of PE - Continue xarelto as above     Dispo: Follow up with APP in 4 weeks   Signed, Jonita Albee, PA-C

## 2023-11-08 ENCOUNTER — Ambulatory Visit: Payer: Medicare Other | Attending: Cardiology | Admitting: Cardiology

## 2023-11-08 ENCOUNTER — Encounter: Payer: Self-pay | Admitting: Cardiology

## 2023-11-08 VITALS — BP 158/82 | HR 60 | Ht 66.0 in | Wt 129.0 lb

## 2023-11-08 DIAGNOSIS — I471 Supraventricular tachycardia, unspecified: Secondary | ICD-10-CM

## 2023-11-08 DIAGNOSIS — I480 Paroxysmal atrial fibrillation: Secondary | ICD-10-CM | POA: Diagnosis not present

## 2023-11-08 DIAGNOSIS — R072 Precordial pain: Secondary | ICD-10-CM

## 2023-11-08 DIAGNOSIS — I421 Obstructive hypertrophic cardiomyopathy: Secondary | ICD-10-CM

## 2023-11-08 DIAGNOSIS — I10 Essential (primary) hypertension: Secondary | ICD-10-CM

## 2023-11-08 DIAGNOSIS — E782 Mixed hyperlipidemia: Secondary | ICD-10-CM | POA: Diagnosis not present

## 2023-11-08 DIAGNOSIS — I7121 Aneurysm of the ascending aorta, without rupture: Secondary | ICD-10-CM | POA: Diagnosis not present

## 2023-11-08 MED ORDER — ISOSORBIDE MONONITRATE ER 30 MG PO TB24: 30.0000 mg | ORAL_TABLET | Freq: Every day | ORAL | 3 refills | Status: AC

## 2023-11-08 NOTE — Patient Instructions (Signed)
 Medication Instructions:  Take Imdur 30 mg (1 tablet) once a day  *If you need a refill on your cardiac medications before your next appointment, please call your pharmacy*  Lab Work: No labs If you have labs (blood work) drawn today and your tests are completely normal, you will receive your results only by: MyChart Message (if you have MyChart) OR A paper copy in the mail If you have any lab test that is abnormal or we need to change your treatment, we will call you to review the results.  Testing/Procedures: Your physician has requested that you have an echocardiogram. Echocardiography is a painless test that uses sound waves to create images of your heart. It provides your doctor with information about the size and shape of your heart and how well your heart's chambers and valves are working. This procedure takes approximately one hour. There are no restrictions for this procedure. Please do NOT wear cologne, perfume, aftershave, or lotions (deodorant is allowed). Please arrive 15 minutes prior to your appointment time.  Please note: We ask at that you not bring children with you during ultrasound (echo/ vascular) testing. Due to room size and safety concerns, children are not allowed in the ultrasound rooms during exams. Our front office staff cannot provide observation of children in our lobby area while testing is being conducted. An adult accompanying a patient to their appointment will only be allowed in the ultrasound room at the discretion of the ultrasound technician under special circumstances. We apologize for any inconvenience.  Follow-Up: At [facility], you and your health needs are our priority.  As part of our continuing mission to provide you with exceptional heart care, we have created designated Provider Care Teams.  These Care Teams include your primary Cardiologist (physician) and Advanced Practice Providers (APPs -  Physician Assistants and Nurse Practitioners)  who all work together to provide you with the care you need, when you need it.  We recommend signing up for the patient portal called "MyChart".  Sign up information is provided on this After Visit Summary.  MyChart is used to connect with patients for Virtual Visits (Telemedicine).  Patients are able to view lab/test results, encounter notes, upcoming appointments, etc.  Non-urgent messages can be sent to your provider as well.   To learn more about what you can do with MyChart, go to [email or web address].    Your next appointment:   4 week(s)  Provider:   Any APP  Other Instructions Please take your blood pressure daily for 2 weeks and send in a MyChart message. Please include heart rates. (One message at the end of the 2 weeks).   HOW TO TAKE YOUR BLOOD PRESSURE: Rest 5 minutes before taking your blood pressure. Don't smoke or drink caffeinated beverages for at least 30 minutes before. Take your blood pressure before (not after) you eat. Sit comfortably with your back supported and both feet on the floor (don't cross your legs). Elevate your arm to heart level on a table or a desk. Use the proper sized cuff. It should fit smoothly and snugly around your bare upper arm. There should be enough room to slip a fingertip under the cuff. The bottom edge of the cuff should be 1 inch above the crease of the elbow. Ideally, take 3 measurements at one sitting and record the average.

## 2023-11-10 ENCOUNTER — Telehealth: Payer: Self-pay | Admitting: Cardiology

## 2023-11-10 NOTE — Telephone Encounter (Signed)
 Pt states that Monique [name] wanted her to call to give bp reading: Yesterday 11/09/23- 169/91 hr 55 7:45am, 136/82 hr53 11:00a Today- 151/85 hr 53

## 2023-11-10 NOTE — Telephone Encounter (Signed)
 Spoke to pt. Verified name and DOB. Informed pt readings will be shown to [place]. Patient was unsure when to send readings. Informed pt to keep a log for 2 weeks and call with readings since pt does not have Mychart. Let pt know current readings will be sent to [facility] and we would contact her for further advise regarding bp readings. Patient verbalized understanding.   Josie LPN

## 2023-11-13 ENCOUNTER — Telehealth: Payer: Self-pay | Admitting: Nurse Practitioner

## 2023-11-13 NOTE — Telephone Encounter (Signed)
 Patient was calling to give her BP reading for today  141/74; 52

## 2023-11-13 NOTE — Telephone Encounter (Signed)
 Spoke to patient. Verified name and DOB. Patient called with blood pressure reading 141/74 HR 52. I notified pt I would forward reading to provider. Pt unsure if she should call daily with blood, I explained she can keep a log for 2 weeks , until March 19, and call back with readings since she does not have Mychart. Patient verbalized understanding.   Josie LPN

## 2023-11-23 ENCOUNTER — Telehealth: Payer: Self-pay | Admitting: Internal Medicine

## 2023-11-23 NOTE — Telephone Encounter (Signed)
 Patient's 2 weeks of BP readings:  169/91; 55 (11-09-23) 151/85; 53 157/88; 54 141/74; 52 161/87; 52 147/84; 50 160/91; 50 167/98; 45 144/85; 49 136/73; 56 159/96; 49 173/108; 51 178/105; 51(11-22-23)  11-13-23 not recorded  Patient also mentioned she had a headache on the left side. No chest pain or dizziness associated with it

## 2023-11-23 NOTE — Telephone Encounter (Signed)
 Left voicemail to return call to office

## 2023-11-23 NOTE — Telephone Encounter (Signed)
 I would recomm increasing amlodipine to 5 mg 2x per day Keep following BP

## 2023-11-24 NOTE — Telephone Encounter (Signed)
 Spoke with patient and discussed Dr. Tenny Craw' recommendation:  I would recomm increasing amlodipine to 5 mg 2x per day Keep following BP    After talking more with patient she states BP readings reported were when she woke up, before taking her medication. I asked patient if she ever checks her BP after taking her medication and states she does and it is lower, for example this afternoon she had a BP of 124/73, HR 63.  Instructed patient to continue taking amlodipine 5 mg daily and check BP 2 hours after taking medication for the next week. Instructed patient to call with readings, or to call if experiencing headache/blurred vision/consistently elevated BP readings even 2 hours after taking medication. Patient verbalized understanding.

## 2023-11-29 ENCOUNTER — Ambulatory Visit (HOSPITAL_COMMUNITY): Attending: Cardiology

## 2023-11-29 DIAGNOSIS — E782 Mixed hyperlipidemia: Secondary | ICD-10-CM | POA: Diagnosis not present

## 2023-11-29 DIAGNOSIS — I480 Paroxysmal atrial fibrillation: Secondary | ICD-10-CM | POA: Diagnosis not present

## 2023-11-29 DIAGNOSIS — I471 Supraventricular tachycardia, unspecified: Secondary | ICD-10-CM | POA: Diagnosis not present

## 2023-11-29 DIAGNOSIS — I7121 Aneurysm of the ascending aorta, without rupture: Secondary | ICD-10-CM | POA: Diagnosis not present

## 2023-11-29 DIAGNOSIS — I10 Essential (primary) hypertension: Secondary | ICD-10-CM | POA: Diagnosis not present

## 2023-11-29 DIAGNOSIS — R072 Precordial pain: Secondary | ICD-10-CM | POA: Diagnosis not present

## 2023-11-29 DIAGNOSIS — I421 Obstructive hypertrophic cardiomyopathy: Secondary | ICD-10-CM | POA: Insufficient documentation

## 2023-11-29 LAB — ECHOCARDIOGRAM COMPLETE
Area-P 1/2: 1.75 cm2
S' Lateral: 2.5 cm

## 2023-12-04 ENCOUNTER — Telehealth: Payer: Self-pay | Admitting: Cardiology

## 2023-12-04 NOTE — Telephone Encounter (Signed)
 Pt calling to report BP readings: 3/22-138/93, 124/73, 115/82 3/23-162/93, 147/81, 137/83 3/24-128/76, 123/77, 121/71 3/25-119/69, 121/70, 117/66 3/26-138/82, 125/77, 125/80 3/27-116/75, 111/74, 101/74 3/28-126/89, 127/74, 121/75 3/29-131/79, 120/73, 118/76

## 2023-12-04 NOTE — Telephone Encounter (Signed)
 Spoke to patient pulse has been 50 to 60.I will send B/P readings to Robet Leu PA.

## 2023-12-06 NOTE — Telephone Encounter (Signed)
 Spoke to patient Robet Leu PA's advice given.Advised to continue to monitor B/P.Keep appointment with Bernadene Person NP 4/3 at 1:30 pm.

## 2023-12-07 ENCOUNTER — Encounter: Payer: Self-pay | Admitting: Nurse Practitioner

## 2023-12-07 ENCOUNTER — Ambulatory Visit: Attending: Nurse Practitioner | Admitting: Nurse Practitioner

## 2023-12-07 VITALS — BP 124/72 | HR 57 | Ht 65.0 in | Wt 129.4 lb

## 2023-12-07 DIAGNOSIS — Z86711 Personal history of pulmonary embolism: Secondary | ICD-10-CM | POA: Diagnosis not present

## 2023-12-07 DIAGNOSIS — I10 Essential (primary) hypertension: Secondary | ICD-10-CM

## 2023-12-07 DIAGNOSIS — I480 Paroxysmal atrial fibrillation: Secondary | ICD-10-CM

## 2023-12-07 DIAGNOSIS — I421 Obstructive hypertrophic cardiomyopathy: Secondary | ICD-10-CM

## 2023-12-07 DIAGNOSIS — E782 Mixed hyperlipidemia: Secondary | ICD-10-CM

## 2023-12-07 DIAGNOSIS — I77810 Thoracic aortic ectasia: Secondary | ICD-10-CM | POA: Diagnosis not present

## 2023-12-07 MED ORDER — AMLODIPINE BESYLATE 10 MG PO TABS: 10.0000 mg | ORAL_TABLET | Freq: Every day | ORAL | 3 refills | Status: AC

## 2023-12-07 NOTE — Progress Notes (Signed)
 Office Visit    Patient Name: Monique [name] Date of Encounter: 12/07/2023  Primary Care Provider:  Myrlene Broker, MD Primary Cardiologist:  Dietrich Pates, MD  Chief Complaint    88 year old female with a history of HCM, paroxysmal atrial fibrillation, SVT, dilation of the ascending aorta, history of PE/DVT, hypertension, hyperlipidemia, and CKD stage III who presents for follow-up related to hypertension.  Past Medical History    Past Medical History:  Diagnosis Date   Abdominal pain 11/18/2015   Acute respiratory failure with hypoxia (HCC) 06/27/2014   Breast pain, left 10/11/2016   Cerebral aneurysm 2000 06/27/2014   Chest pain 10/30/2014   CHF (congestive heart failure) (HCC)    Chronic anticoagulation 06/2014   Coumadin started after bilateral PE   Chronic kidney disease, stage III (moderate) (HCC) 11/21/2014   Clotting disorder (HCC)    right was removed, still have left cataract   Diabetes (HCC)    Dizziness and giddiness 11/28/2014   DVT of lower extremity (deep venous thrombosis) (HCC) 11/28/2014   Dysuria 07/07/2014   Elevated troponin 11/28/2014   Fatty liver    Gastritis 05/16/2012   Chronic dyspepsia on PPI daily.    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   CMR 4/22: EF 72, small amount of patchy LGE in basal septum c/w HCM, asymmetric basal septal hypertrophy (15 mm) c/w HCM, no ischemia, ascending aorta 38 mm   Kidney stones    LVH (left ventricular hypertrophy)    Orthostatic hypotension 11/28/2014   PAF (paroxysmal atrial fibrillation) (HCC) 2000   Palpitations 11/28/2014   PAROXYSMAL ATRIAL FIBRILLATION 05/20/2008   Qualifier: Diagnosis of  By: Debby Bud MD, Rosalyn Gess    PE (pulmonary embolism) 06/2014    Bilateral   Postmenopausal osteoporosis 04/2019   T score -3.3   Pulmonary embolism, bilateral (HCC) 06/27/2014   SOB (shortness of breath) 06/27/2014   Subarachnoid hemorrhage (HCC) 2000   UTI (urinary tract infection) 01/04/2015   Past Surgical History:  Procedure Laterality Date   ABDOMINAL HYSTERECTOMY  2000   Brain hemorrage     2000   CARDIAC CATHETERIZATION  05/2008   Nl cors, no gradient, EF 60%    Allergies  No Known Allergies   Labs/Other Studies Reviewed    The following studies were reviewed today:  Cardiac Studies & Procedures   ______________________________________________________________________________________________   STRESS TESTS  MYOCARDIAL PERFUSION IMAGING 09/04/2019  Narrative  The left ventricular ejection fraction is normal (55-65%).  Nuclear stress EF: 65%.  No T wave inversion was noted during stress.  There was no ST segment deviation noted during stress.  This is a low risk study.  Normal perfusion. LVEF 65% with normal wall motion. This is a low risk study.   ECHOCARDIOGRAM  ECHOCARDIOGRAM COMPLETE 11/29/2023  Narrative ECHOCARDIOGRAM REPORT    Patient Name:   Monique [name] Date of Exam: 11/29/2023 Medical Rec #:  [id number]        Height:       66.0 in Accession #:    [id number]       Weight:       129.0 lb Date of Birth:  05-16-1936        BSA:  1.660 m Patient Age:    88 years         BP:           149/88 mmHg Patient Gender: F                HR:           57 bpm. Exam Location:  [address]  Procedure: 2D Echo, Cardiac Doppler, Color Doppler and Strain Analysis (Both Spectral and Color Flow Doppler were utilized during procedure).  Indications:     I71.21 Aneurysm of Ascending Aorta (without Rupture)  History:         Patient has prior history of Echocardiogram examinations, most recent 09/16/2019. Hypertrophic Cardiomyopathy, Arrythmias:Atrial Fibrillation; Risk Factors:Hypertension  and Dyslipidemia.  Sonographer:     Thurman Coyer RDCS Referring Phys:  [id number] Jonita Albee Diagnosing Phys: Armanda Magic MD   Sonographer Comments: Unable to use Definity- IV attempted (x4) without success. IMPRESSIONS   1. Left ventricular ejection fraction, by estimation, is 70 to 75%. The left ventricle has hyperdynamic function. The left ventricle has no regional wall motion abnormalities. There is moderate concentric left ventricular hypertrophy. Left ventricular diastolic parameters are consistent with Grade I diastolic dysfunction (impaired relaxation). The average left ventricular global longitudinal strain is -19.0 %. The global longitudinal strain is normal. 2. Right ventricular systolic function is normal. The right ventricular size is normal. Tricuspid regurgitation signal is inadequate for assessing PA pressure. 3. Left atrial size was severely dilated. 4. The mitral valve is normal in structure. Trivial mitral valve regurgitation. No evidence of mitral stenosis. 5. The aortic valve is tricuspid. Aortic valve regurgitation is not visualized. Aortic valve sclerosis/calcification is present, without any evidence of aortic stenosis. 6. There is mild dilatation of the ascending aorta, measuring 40 mm. 7. The inferior vena cava is normal in size with greater than 50% respiratory variability, suggesting right atrial pressure of 3 mmHg.  FINDINGS Left Ventricle: Left ventricular ejection fraction, by estimation, is 70 to 75%. The left ventricle has hyperdynamic function. The left ventricle has no regional wall motion abnormalities. The average left ventricular global longitudinal strain is -19.0 %. Strain was performed and the global longitudinal strain is normal. The left ventricular internal cavity size was normal in size. There is moderate concentric left ventricular hypertrophy. Left ventricular diastolic parameters are consistent with Grade I diastolic dysfunction  (impaired relaxation). Normal left ventricular filling pressure.  Right Ventricle: The right ventricular size is normal. No increase in right ventricular wall thickness. Right ventricular systolic function is normal. Tricuspid regurgitation signal is inadequate for assessing PA pressure.  Left Atrium: Left atrial size was severely dilated.  Right Atrium: Right atrial size was normal in size.  Pericardium: There is no evidence of pericardial effusion.  Mitral Valve: The mitral valve is normal in structure. Trivial mitral valve regurgitation. No evidence of mitral valve stenosis.  Tricuspid Valve: The tricuspid valve is normal in structure. Tricuspid valve regurgitation is trivial. No evidence of tricuspid stenosis.  Aortic Valve: The aortic valve is tricuspid. Aortic valve regurgitation is not visualized. Aortic valve sclerosis/calcification is present, without any evidence of aortic stenosis.  Pulmonic Valve: The pulmonic valve was normal in structure. Pulmonic valve regurgitation is trivial. No evidence of pulmonic stenosis.  Aorta: The aortic root is normal in size and structure. There is mild dilatation of the ascending aorta, measuring 40 mm.  Venous: The inferior vena cava is normal in size with greater than 50% respiratory variability, suggesting right  atrial pressure of 3 mmHg.  IAS/Shunts: No atrial level shunt detected by color flow Doppler.   LEFT VENTRICLE PLAX 2D LVIDd:         3.80 cm   Diastology LVIDs:         2.50 cm   LV e' medial:    4.05 cm/s LV PW:         1.20 cm   LV E/e' medial:  8.8 LV IVS:        1.40 cm   LV e' lateral:   7.78 cm/s LVOT diam:     2.20 cm   LV E/e' lateral: 4.6 LV SV:         137 LV SV Index:   82        2D Longitudinal Strain LVOT Area:     3.80 cm  2D Strain GLS (A4C):   -19.9 % 2D Strain GLS (A3C):   -19.1 % 2D Strain GLS (A2C):   -18.1 % 2D Strain GLS Avg:     -19.0 %  RIGHT VENTRICLE RV Basal diam:  3.90 cm RV Mid diam:    4.10  cm RV S prime:     10.90 cm/s TAPSE (M-mode): 1.5 cm  LEFT ATRIUM             Index        RIGHT ATRIUM           Index LA diam:        3.60 cm 2.17 cm/m   RA Pressure: 3.00 mmHg LA Vol (A2C):   83.9 ml 50.55 ml/m  RA Area:     12.10 cm LA Vol (A4C):   72.4 ml 43.62 ml/m  RA Volume:   25.30 ml  15.24 ml/m LA Biplane Vol: 79.0 ml 47.60 ml/m AORTIC VALVE LVOT Vmax:   157.00 cm/s LVOT Vmean:  102.000 cm/s LVOT VTI:    0.360 m  AORTA Ao Root diam: 3.30 cm Ao Asc diam:  4.00 cm  MITRAL VALVE               TRICUSPID VALVE MV Area (PHT): cm         Estimated RAP:  3.00 mmHg MV Decel Time: 433 msec MV E velocity: 35.70 cm/s  SHUNTS MV A velocity: 68.10 cm/s  Systemic VTI:  0.36 m MV E/A ratio:  0.52        Systemic Diam: 2.20 cm  Armanda Magic MD Electronically signed by Armanda Magic MD Signature Date/Time: 11/29/2023/5:13:56 PM    Final (Updated)    MONITORS  LONG TERM MONITOR (3-14 DAYS) 01/31/2023  Narrative Patch Wear Time:  13 days and 23 hours (2024-05-06T14:10:01-399 to 2024-05-20T14:10:09-398)  Predominant rhythm:  Sinus rhythm   Rates 41 to 87 bpm  Average HR 56 bpm Rare PVC, PAC. Short burst VT (4 beats at 143 bpm.   15 episodes of SVT, fasted for 6 beats at 156 bpm; longest SVT for 16 beats at 123 bpm  Triggered events/ diary entries corresponded to sinus rhythm  _______________________________________________________________________________________________________________________  Patient had a min HR of 41 bpm, max HR of 156 bpm, and avg HR of 56 bpm. Predominant underlying rhythm was Sinus Rhythm. 1 run of Ventricular Tachycardia occurred lasting 4 beats with a max rate of 143 bpm (avg 121 bpm). 15 Supraventricular Tachycardia runs occurred, the run with the fastest interval lasting 6 beats with a max rate of 156 bpm, the longest lasting 16 beats with an avg rate of 123  bpm. Isolated SVEs were rare (<1.0%), SVE Couplets were rare (<1.0%), and SVE Triplets  were rare (<1.0%). Isolated VEs were rare (<1.0%, 1298), VE Couplets were rare (<1.0%, 8), and VE Triplets were rare (<1.0%, 2). Ventricular Trigeminy was present.   CT SCANS  CT CORONARY MORPH W/CTA COR W/SCORE 12/21/2021  Addendum 12/21/2021 12:58 PM ADDENDUM REPORT: 12/21/2021 12:56  CLINICAL DATA:  This is a 88 year old female with anginal symptoms.  EXAM: Cardiac/Coronary  CTA  TECHNIQUE: The patient was scanned on a [employer].  FINDINGS: A 100 kV prospective scan was triggered in the descending thoracic aorta at 111 HU's. Axial non-contrast 3 mm slices were carried out through the heart. The data set was analyzed on a dedicated work station and scored using the Agatson method. Gantry rotation speed was 250 msecs and collimation was .6 mm. No beta blockade and 0.8 mg of sl NTG was given. The 3D data set was reconstructed in 5% intervals of the 67-82 % of the R-R cycle. Diastolic phases were analyzed on a dedicated work station using MPR, MIP and VRT modes. The patient received 80 cc of contrast.  Aorta: Normal size.  No calcifications.  No dissection.  Aortic Valve:  Trileaflet.  No calcifications.  Coronary Arteries:  Normal coronary origin.  Right dominance.  RCA is a large dominant artery that gives rise to PDA and PLA. There is no plaque.  Left main is a large artery that gives rise to LAD and LCX arteries.  LAD is a large vessel that has no plaque.  LCX is a non-dominant artery that gives rise to one large OM1 branch. There is no plaque.  Coronary Calcium Score:  Left main: 0  Left anterior descending artery: 0  Left circumflex artery: 0  Right coronary artery: 0  Total: 0  Percentile: 0  Other findings:  Normal pulmonary vein drainage into the left atrium.  Normal left atrial appendage without a thrombus.  Normal size of the pulmonary artery.  IMPRESSION: 1. Coronary calcium score of 0. This was 0 percentile for age and sex  matched control.  2. Normal coronary origin with right dominance.  3. CAD-RADS 0. No evidence of CAD (0%). Consider non-atherosclerotic causes of chest pain.  The noncardiac portion of this study will be interpreted in separate report by the radiologist.   Electronically Signed By: Thomasene Ripple D.O. On: 12/21/2021 12:56  Narrative EXAM: OVER-READ INTERPRETATION  PET-CT CHEST  The following report is an over-read performed by radiologist Dr. Leatha Gilding [facility] on 12/21/2021. This over-read does not include interpretation of cardiac or coronary anatomy or pathology. The cardiac CT interpretation by the cardiologist is to be attached.  COMPARISON:  None.  FINDINGS: Multiple pulmonary nodules are identified including 3 mm right middle lobe nodule on 64/2 and a 4 mm right lower lobe nodule on the same image. Peripheral tiny nodules measuring 2-3 mm identified posterior left lower lobe in the costophrenic sulcus.  The visualized portions of the mediastinum and chest wall are unremarkable.  IMPRESSION: Multiple tiny bilateral pulmonary nodules measuring up to 4 mm. No follow-up needed if patient is low-risk (and has no known or suspected primary neoplasm). Non-contrast chest CT can be considered in 12 months if patient is high-risk. This recommendation follows the consensus statement: Guidelines for Management of Incidental Pulmonary Nodules Detected on CT Images: From the Fleischner Society 2017; Radiology 2017; 284:228-243.  Electronically Signed: By: Kennith Center M.D. On: 12/21/2021 10:46  ______________________________________________________________________________________________     Recent Labs: 04/21/2023: TSH 1.180 08/08/2023: ALT 13; BUN 22; Creatinine, Ser 1.22; Hemoglobin 14.0; Platelets 155.0; Potassium 4.1; Sodium 141  Recent Lipid Panel    Component Value Date/Time   CHOL 138 11/18/2022 0921   CHOL 140 11/11/2016 0900   TRIG  83.0 11/18/2022 0921   HDL 70.20 11/18/2022 0921   HDL 51 11/11/2016 0900   CHOLHDL 2 11/18/2022 0921   VLDL 16.6 11/18/2022 0921   LDLCALC 51 11/18/2022 0921   LDLCALC 52 04/17/2020 1451    History of Present Illness    88 year old female with the above past medical history including HCM, paroxysmal atrial fibrillation, SVT, dilation of the ascending aorta, history of PE/DVT, hypertension, hyperlipidemia, and CKD stage III.  Nuclear stress test in 2019 was a low risk study with normal perfusion, normal LV regional and global systolic function.  Echocardiogram in 2019 showed EF 65-70%, moderate LVH, peak gradient 34 mmHg, grade 1 DD.  Repeat stress test in 2020 was low risk, normal, EF 65%.  MRI in 2022 showed no evidence of ischemia. There was asymmetric hypertrophy of basal septum measuring up to 15 mm, meeting criteria for hypertrophic cardiomyopathy. There was also a small amount of patchy LGE in the basal septum consistent with HCM. Coronary CTA in 2023 showed calcium score of 0, no evidence of CAD.  She does have a history of paroxysmal atrial fibrillation, PE/DVT on chronic anticoagulation. Cardiac monitor in 01/2023 showed predominantly NSR with 15 episodes of SVT, longest lasted 16 beats.  She was last seen in office on 11/08/2023 and reported intermittent chest, back, and shoulder pain that occurred only when lying in bed.  BP was elevated.  Imdur was increased to 30 mg daily.  She contacted our office on 11/23/2023 with concern for ongoing elevated BP.  Amlodipine was increased to 10 mg daily.  Echocardiogram in 11/2023 showed EF 70 to 75%, hyperdynamic LV function, no RWMA, moderate concentric LVH, G1 DD, normal RV systolic function, mild dilation of ascending aorta measuring 40 mm.  She presents today for follow-up.  Since her last visit been stable from a cardiac standpoint. She continues to note positional left arm pain, intermittent sharp pain under her left breast; pain is nonexertional,  reproducible on palpation.  Symptoms did not improve with increased Imdur. She also reports a history of intermittent headaches. She states she is worried she has a history of a "brain bleed" and had headaches at the time.  Headaches are generally relieved with Tylenol, and have not occurred since blood pressure has normalized.    Home Medications    Current Outpatient Medications  Medication Sig Dispense Refill   acetaminophen (TYLENOL) 500 MG tablet Take 1,000 mg by mouth every 6 (six) hours as needed for moderate pain or headache.     amLODipine (NORVASC) 10 MG tablet Take 1 tablet (10 mg total) by mouth daily. 90 tablet 3   atorvastatin (LIPITOR) 10 MG tablet TAKE 1 TABLET BY MOUTH EVERY DAY 90 tablet 1   disopyramide (NORPACE) 150 MG capsule TAKE 1 CAPSULE (150 MG TOTAL) BY MOUTH 2 (TWO) TIMES DAILY. APPOINTMENT NEEDED FOR FUTURE REFILLS. THANK YOU. 180 capsule 3   isosorbide mononitrate (IMDUR) 30 MG 24 hr tablet Take 1 tablet (30 mg total) by mouth daily. 90 tablet 3   lidocaine (LIDODERM) 5 % Place 1 patch onto the skin daily. 30 patch 11   metFORMIN (GLUCOPHAGE) 500 MG tablet TAKE 1 TABLET BY MOUTH EVERY DAY WITH  BREAKFAST 90 tablet 1   metoprolol tartrate (LOPRESSOR) 50 MG tablet Take 1 tablet (50 mg total) by mouth 2 (two) times daily. 180 tablet 3   mirtazapine (REMERON) 15 MG tablet TAKE 1 TABLET BY MOUTH EVERYDAY AT BEDTIME 90 tablet 3   nitroGLYCERIN (NITROSTAT) 0.4 MG SL tablet PLACE 1 TABLET UNDER THE TONGUE EVERY 5 MINUTES AS NEEDED FOR CHEST PAIN. 25 tablet 6   XARELTO 15 MG TABS tablet TAKE 1 TABLET BY MOUTH EVERY DAY WITH SUPPER 30 tablet 5   No current facility-administered medications for this visit.     Review of Systems    She denies palpitations, dyspnea, pnd, orthopnea, n, v, dizziness, syncope, edema, weight gain, or early satiety. All other systems reviewed and are otherwise negative except as noted above.   Physical Exam    VS:  BP 124/72   Pulse (!) 57   Ht  5\' 5"  (1.651 m)   Wt 129 lb 6.4 oz (58.7 kg)   SpO2 96%   BMI 21.53 kg/m  GEN: Well nourished, well developed, in no acute distress. HEENT: normal. Neck: Supple, no JVD, carotid bruits, or masses. Cardiac: RRR, no murmurs, rubs, or gallops. No clubbing, cyanosis, edema.  Radials/DP/PT 2+ and equal bilaterally.  Respiratory:  Respirations regular and unlabored, clear to auscultation bilaterally. GI: Soft, nontender, nondistended, BS + x 4. MS: no deformity or atrophy. Skin: warm and dry, no rash. Neuro:  Strength and sensation are intact. Psych: Normal affect.  Accessory Clinical Findings    ECG personally reviewed by me today - EKG Interpretation Date/Time:  Thursday December 07 2023 13:36:36 EDT Ventricular Rate:  57 PR Interval:    QRS Duration:  90 QT Interval:  460 QTC Calculation: 447 R Axis:   -51  Text Interpretation: Normal sinus rhythm Left anterior fascicular block Left ventricular hypertrophy ( R in aVL , Cornell product , Romhilt-[name] ) Nonspecific T wave abnormality , Ectopy Confirmed by Bernadene Person ([id number]) on 12/07/2023 1:43:09 PM  - no acute changes.   Lab Results  Component Value Date   WBC 5.7 08/08/2023   HGB 14.0 08/08/2023   HCT 43.1 08/08/2023   MCV 100.1 (H) 08/08/2023   PLT 155.0 08/08/2023   Lab Results  Component Value Date   CREATININE 1.22 (H) 08/08/2023   BUN 22 08/08/2023   NA 141 08/08/2023   K 4.1 08/08/2023   CL 105 08/08/2023   CO2 32 08/08/2023   Lab Results  Component Value Date   ALT 13 08/08/2023   AST 23 08/08/2023   ALKPHOS 74 08/08/2023   BILITOT 0.5 08/08/2023   Lab Results  Component Value Date   CHOL 138 11/18/2022   HDL 70.20 11/18/2022   LDLCALC 51 11/18/2022   TRIG 83.0 11/18/2022   CHOLHDL 2 11/18/2022    Lab Results  Component Value Date   HGBA1C 6.7 (H) 08/08/2023    Assessment & Plan   1. Atypical chest pain: She continues to note positional left arm pain, intermittent sharp pain under her left breast;  pain is nonexertional, reproducible on palpation.  Symptoms did not improve with increased Imdur.  Coronary CTA in 2023 showed coronary calcium score of 0, no evidence of CAD. Prior ischemic evaluations were also reassuring. Symptoms overall atypical.  I do not think further ischemic evaluation is warranted at this time. Continue to monitor for any progressive or exertional symptoms.  Continue metoprolol, amlodipine, Imdur.  2. Hypertension: Amlodipine was recently increased.  BP now well  controlled.  She reports a history of intermittent headaches. She states she is worried as she has a history of a "brain bleed" and had headaches at the time.  Headaches are generally relieved with Tylenol, and have not occurred since blood pressure has stabilized. Continue to monitor symptoms. Reviewed ED precautions. Continue current antihypertensive regimen.   3. HCM: Echocardiogram in 11/2023 showed EF 70 to 75%, hyperdynamic LV function, no RWMA, moderate concentric LVH, G1 DD, normal RV systolic function, mild dilation of ascending aorta measuring 40 mm. Euvolemic and well compensated on exam.  She denies any dizziness, presyncope or syncope.  No audible murmur.  Encouraged adequate hydration.  4. Paroxysmal atrial fibrillation: Maintaining NSR.  Denies any palpitations.  Continue metoprolol, Xarelto.  5. History of PE/DVT: Continue Xarelto.   6. Dilation of ascending aorta: Stable on most recent echo, measuring 40 mm. Consider repeat imaging with either repeat echo vs CT chest/aorta in 1 year.    7. Hyperlipidemia: LDL was 51 in 11/2022. Continue Lipitor.   8. CKD stage III: Creatinine was 1.22 in 08/2023.   9. Disposition: Follow-up in 3 months.       Joylene Grapes, NP 12/07/2023, 4:29 PM

## 2023-12-07 NOTE — Patient Instructions (Signed)
 Medication Instructions:   Increase to 10 mg amlodipine daily   *If you need a refill on your cardiac medications before your next appointment, please call your pharmacy*   Lab Work:    Not needed.   Testing/Procedures:  Not needed  Follow-Up: At [facility], you and your health needs are our priority.  As part of our continuing mission to provide you with exceptional heart care, we have created designated Provider Care Teams.  These Care Teams include your primary Cardiologist (physician) and Advanced Practice Providers (APPs -  Physician Assistants and Nurse Practitioners) who all work together to provide you with the care you need, when you need it.  We recommend signing up for the patient portal called "MyChart".  Sign up information is provided on this After Visit Summary.  MyChart is used to connect with patients for Virtual Visits (Telemedicine).  Patients are able to view lab/test results, encounter notes, upcoming appointments, etc.  Non-urgent messages can be sent to your provider as well.   To learn more about what you can do with MyChart, go to [email or web address].    Your next appointment:   3 month(s)  The format for your next appointment:   In Person  Provider:   Dietrich Pates, MD    Other Instructions

## 2023-12-11 ENCOUNTER — Telehealth: Payer: Self-pay

## 2023-12-11 NOTE — Telephone Encounter (Signed)
 Called patient advised of below they verbalized understanding.

## 2023-12-11 NOTE — Telephone Encounter (Signed)
-----   Message from Jonita Albee sent at 12/01/2023  8:57 AM EDT ----- Please tell patient that her recent echocardiogram showed EF (pump strength of the heart) was strong at 70-75%. Her heart muscle is moderately thickened and her heart does not fully relax. This is pretty consistent with what we have seen on her past echocardiograms and does not appear to have significantly changed. The RV is functioning normally. Her left atrium is dilated, which is something that can happen in patients with a history of atrial fibrillation. 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 This has been stable over time. We will continue to monitor in the future   No changes to treatment plan at this time. She should continue to monitor her BP to make sure that it is well controlled. She should also make sure to stay hydrated. Follow up as scheduled

## 2023-12-26 DIAGNOSIS — Z1231 Encounter for screening mammogram for malignant neoplasm of breast: Secondary | ICD-10-CM | POA: Diagnosis not present

## 2023-12-26 LAB — HM MAMMOGRAPHY

## 2024-01-18 ENCOUNTER — Telehealth: Payer: Self-pay

## 2024-01-18 NOTE — Telephone Encounter (Signed)
 Good afternoon!  Working back through the list of osteoporosis patients and was wanting to know if you would like patient to start back on prolia  or evenity? Last injection was in 2023. Last DEXA in 2020

## 2024-01-19 NOTE — Telephone Encounter (Signed)
 Would need visit to discuss with provider

## 2024-01-22 ENCOUNTER — Other Ambulatory Visit: Payer: Self-pay | Admitting: Internal Medicine

## 2024-01-22 NOTE — Telephone Encounter (Signed)
 Copied from CRM [id number]. Topic: Clinical - Medication Refill >> Jan 22, 2024 11:43 AM Clyde Darling P wrote: Medication: metFORMIN  (GLUCOPHAGE ) 500 MG tablet  Has the patient contacted their pharmacy? Yes- pt has nomore refills (Agent: If no, request that the patient contact the pharmacy for the refill. If patient does not wish to contact the pharmacy document the reason why and proceed with request.) (Agent: If yes, when and what did the pharmacy advise?)  This is the patient's preferred pharmacy:  [id number] - [address] [place] [address] Phone: [phone] Fax: [phone]  Is this the correct pharmacy for this prescription? Yes If no, delete pharmacy and type the correct one.   Has the prescription been filled recently? No  Is the patient out of the medication? Pt has 3 days left.  Has the patient been seen for an appointment in the last year OR does the patient have an upcoming appointment? Yes  Can we respond through MyChart? Yes  Agent: Please be advised that Rx refills may take up to 3 business days. We ask that you follow-up with your pharmacy.

## 2024-01-23 MED ORDER — METFORMIN HCL 500 MG PO TABS: 500.0000 mg | ORAL_TABLET | Freq: Every day | ORAL | 1 refills | Status: DC

## 2024-01-24 NOTE — Telephone Encounter (Signed)
 Scheduled patient to come in and discuss.

## 2024-02-06 ENCOUNTER — Ambulatory Visit (INDEPENDENT_AMBULATORY_CARE_PROVIDER_SITE_OTHER): Admitting: Internal Medicine

## 2024-02-06 ENCOUNTER — Encounter: Payer: Self-pay | Admitting: Internal Medicine

## 2024-02-06 VITALS — BP 160/80 | HR 57 | Temp 97.8°F | Ht 65.0 in | Wt 129.0 lb

## 2024-02-06 DIAGNOSIS — N1832 Chronic kidney disease, stage 3b: Secondary | ICD-10-CM

## 2024-02-06 DIAGNOSIS — E1169 Type 2 diabetes mellitus with other specified complication: Secondary | ICD-10-CM

## 2024-02-06 DIAGNOSIS — E118 Type 2 diabetes mellitus with unspecified complications: Secondary | ICD-10-CM | POA: Diagnosis not present

## 2024-02-06 DIAGNOSIS — E785 Hyperlipidemia, unspecified: Secondary | ICD-10-CM | POA: Diagnosis not present

## 2024-02-06 DIAGNOSIS — M810 Age-related osteoporosis without current pathological fracture: Secondary | ICD-10-CM

## 2024-02-06 DIAGNOSIS — I10 Essential (primary) hypertension: Secondary | ICD-10-CM | POA: Diagnosis not present

## 2024-02-06 DIAGNOSIS — Z7984 Long term (current) use of oral hypoglycemic drugs: Secondary | ICD-10-CM | POA: Diagnosis not present

## 2024-02-06 DIAGNOSIS — R0789 Other chest pain: Secondary | ICD-10-CM | POA: Diagnosis not present

## 2024-02-06 LAB — COMPREHENSIVE METABOLIC PANEL WITH GFR
ALT: 17 U/L (ref 0–35)
AST: 29 U/L (ref 0–37)
Albumin: 4.1 g/dL (ref 3.5–5.2)
Alkaline Phosphatase: 75 U/L (ref 39–117)
BUN: 24 mg/dL — ABNORMAL HIGH (ref 6–23)
CO2: 30 meq/L (ref 19–32)
Calcium: 10 mg/dL (ref 8.4–10.5)
Chloride: 106 meq/L (ref 96–112)
Creatinine, Ser: 1.29 mg/dL — ABNORMAL HIGH (ref 0.40–1.20)
GFR: 37.75 mL/min — ABNORMAL LOW (ref >60.00)
Glucose, Bld: 132 mg/dL — ABNORMAL HIGH (ref 70–99)
Potassium: 4.3 meq/L (ref 3.5–5.1)
Sodium: 142 meq/L (ref 135–145)
Total Bilirubin: 0.5 mg/dL (ref 0.2–1.2)
Total Protein: 6.8 g/dL (ref 6.0–8.3)

## 2024-02-06 LAB — MICROALBUMIN / CREATININE URINE RATIO
Creatinine,U: 130.2 mg/dL
Microalb Creat Ratio: 18.5 mg/g (ref 0.0–30.0)
Microalb, Ur: 2.4 mg/dL — ABNORMAL HIGH (ref 0.0–1.9)

## 2024-02-06 LAB — LIPID PANEL
Cholesterol: 141 mg/dL (ref 0–200)
HDL: 72.8 mg/dL (ref >39.00)
LDL Cholesterol: 54 mg/dL (ref 0–99)
NonHDL: 67.7
Total CHOL/HDL Ratio: 2
Triglycerides: 70 mg/dL (ref 0.0–149.0)
VLDL: 14 mg/dL (ref 0.0–40.0)

## 2024-02-06 LAB — CBC
HCT: 41.3 % (ref 36.0–46.0)
Hemoglobin: 13.7 g/dL (ref 12.0–15.0)
MCHC: 33.3 g/dL (ref 30.0–36.0)
MCV: 97.4 fl (ref 78.0–100.0)
Platelets: 158 10*3/uL (ref 150.0–400.0)
RBC: 4.24 Mil/uL (ref 3.87–5.11)
RDW: 13 % (ref 11.5–15.5)
WBC: 6.2 10*3/uL (ref 4.0–10.5)

## 2024-02-06 LAB — HEMOGLOBIN A1C: Hgb A1c MFr Bld: 6.8 % — ABNORMAL HIGH (ref 4.6–6.5)

## 2024-02-06 MED ORDER — PREDNISONE 20 MG PO TABS: 40.0000 mg | ORAL_TABLET | Freq: Every day | ORAL | 0 refills | Status: DC

## 2024-02-06 NOTE — Assessment & Plan Note (Signed)
 Checking UACR and HgA1c and CMP and lipid panel and adjust metformin  500 mg daily as needed. On statin.

## 2024-02-06 NOTE — Assessment & Plan Note (Signed)
 Having recurrence of left sided atypical chest pain. Rx prednisone  short course as this resolved symptoms previously. She is using lidocaine  5% patches and tylenol  as well.

## 2024-02-06 NOTE — Assessment & Plan Note (Signed)
 Only took 1 dose of prolia  for some reason and overdue for DEXA. No falls or fractures. Ordered DEXA and depending on results will likely need treatment. We discussed that if we start prolia  this is meant to be every 6 months for life once started.

## 2024-02-06 NOTE — Progress Notes (Signed)
   Subjective:   Patient ID: Monique [name], female    DOB: 09-07-1935, 88 y.o.   MRN: [id number]  HPI The patient is an 88 YO female coming in for follow up osteoporosis. Had started on prolia  in 2022 but is not taking since 10/2021 (appears she only got 1 dose). Last DEXA 2020. Also having left sided chest pain for a few weeks. Sometimes lasts all day and sometimes no pain in a day.   Review of Systems  Constitutional: Negative.   HENT: Negative.    Eyes: Negative.   Respiratory:  Positive for chest tightness. Negative for cough and shortness of breath.   Cardiovascular:  Positive for chest pain. Negative for palpitations and leg swelling.  Gastrointestinal:  Negative for abdominal distention, abdominal pain, constipation, diarrhea, nausea and vomiting.  Musculoskeletal: Negative.   Skin: Negative.   Neurological: Negative.   Psychiatric/Behavioral: Negative.      Objective:  Physical Exam Constitutional:      Appearance: She is well-developed.  HENT:     Head: Normocephalic and atraumatic.  Cardiovascular:     Rate and Rhythm: Normal rate and regular rhythm.  Pulmonary:     Effort: Pulmonary effort is normal. No respiratory distress.     Breath sounds: Normal breath sounds. No wheezing or rales.  Abdominal:     General: Bowel sounds are normal. There is no distension.     Palpations: Abdomen is soft.     Tenderness: There is no abdominal tenderness. There is no rebound.  Musculoskeletal:     Cervical back: Normal range of motion.  Skin:    General: Skin is warm and dry.  Neurological:     Mental Status: She is alert and oriented to person, place, and time.     Coordination: Coordination normal.     Vitals:   02/06/24 0840 02/06/24 0841  BP: (!) 160/80 (!) 160/80  Pulse: (!) 57   Temp: 97.8 F (36.6 C)   TempSrc: Oral   SpO2: 97%   Weight: 129 lb (58.5 kg)   Height: 5\' 5"  (1.651 m)     Assessment & Plan:

## 2024-02-06 NOTE — Assessment & Plan Note (Signed)
 BP mildly elevated today and will monitor closely. Taking metoprolol  50 mg BID and imdur  and disopyramide  and amlodipine . Checking CMP and adjust as needed.

## 2024-02-06 NOTE — Patient Instructions (Signed)
 We have sent in the prednisone  to help the pain.   We will get the labs today and the bone density test to check the bones.

## 2024-02-06 NOTE — Assessment & Plan Note (Signed)
Checking CMP for stability.  

## 2024-02-06 NOTE — Assessment & Plan Note (Signed)
Checking lipid panel and adjust lipitor as needed.  

## 2024-02-07 ENCOUNTER — Ambulatory Visit: Payer: Self-pay | Admitting: Internal Medicine

## 2024-02-13 ENCOUNTER — Ambulatory Visit
Admission: RE | Admit: 2024-02-13 | Discharge: 2024-02-13 | Disposition: A | Discharge status: Home / Self Care | Source: Ambulatory Visit | Attending: Internal Medicine | Admitting: Internal Medicine

## 2024-02-13 ENCOUNTER — Other Ambulatory Visit

## 2024-02-13 DIAGNOSIS — M810 Age-related osteoporosis without current pathological fracture: Secondary | ICD-10-CM

## 2024-02-26 ENCOUNTER — Telehealth: Payer: Self-pay | Admitting: Internal Medicine

## 2024-02-26 NOTE — Telephone Encounter (Signed)
 Copied from CRM [id number]. Topic: Clinical - Lab/Test Results >> Feb 26, 2024  1:41 PM Monique [name] wrote: Reason for CRM: Pt is calling to get the recent lab results for her xray done on 6/10.

## 2024-02-27 LAB — HM DEXA SCAN: HM Dexa Scan: -3.5

## 2024-02-27 MED ORDER — ALENDRONATE SODIUM 70 MG PO TABS
70.0000 mg | ORAL_TABLET | ORAL | 3 refills | Status: AC
Start: 2024-02-27 — End: ?

## 2024-02-27 NOTE — Telephone Encounter (Signed)
 Please advise I do see her bone density was done not an x-ray

## 2024-02-27 NOTE — Telephone Encounter (Signed)
 A bone density is an x-ray. Okay to give results this is resulted.

## 2024-02-29 ENCOUNTER — Ambulatory Visit: Admitting: Family Medicine

## 2024-02-29 ENCOUNTER — Ambulatory Visit: Payer: Self-pay | Admitting: Family Medicine

## 2024-02-29 ENCOUNTER — Encounter: Payer: Self-pay | Admitting: Family Medicine

## 2024-02-29 VITALS — BP 146/74 | HR 57 | Temp 98.5°F | Ht 65.0 in | Wt 128.6 lb

## 2024-02-29 DIAGNOSIS — M7989 Other specified soft tissue disorders: Secondary | ICD-10-CM | POA: Diagnosis not present

## 2024-02-29 DIAGNOSIS — M810 Age-related osteoporosis without current pathological fracture: Secondary | ICD-10-CM | POA: Diagnosis not present

## 2024-02-29 DIAGNOSIS — I5032 Chronic diastolic (congestive) heart failure: Secondary | ICD-10-CM | POA: Diagnosis not present

## 2024-02-29 DIAGNOSIS — E785 Hyperlipidemia, unspecified: Secondary | ICD-10-CM | POA: Diagnosis not present

## 2024-02-29 DIAGNOSIS — N1832 Chronic kidney disease, stage 3b: Secondary | ICD-10-CM | POA: Diagnosis not present

## 2024-02-29 DIAGNOSIS — I10 Essential (primary) hypertension: Secondary | ICD-10-CM

## 2024-02-29 DIAGNOSIS — E1169 Type 2 diabetes mellitus with other specified complication: Secondary | ICD-10-CM | POA: Diagnosis not present

## 2024-02-29 LAB — CBC WITH DIFFERENTIAL/PLATELET
Basophils Absolute: 0 10*3/uL (ref 0.0–0.1)
Basophils Relative: 0.3 % (ref 0.0–3.0)
Eosinophils Absolute: 0 10*3/uL (ref 0.0–0.7)
Eosinophils Relative: 0.6 % (ref 0.0–5.0)
HCT: 40.3 % (ref 36.0–46.0)
Hemoglobin: 13.2 g/dL (ref 12.0–15.0)
Lymphocytes Relative: 45.3 % (ref 12.0–46.0)
Lymphs Abs: 2.6 10*3/uL (ref 0.7–4.0)
MCHC: 32.8 g/dL (ref 30.0–36.0)
MCV: 97.6 fl (ref 78.0–100.0)
Monocytes Absolute: 0.5 10*3/uL (ref 0.1–1.0)
Monocytes Relative: 8 % (ref 3.0–12.0)
Neutro Abs: 2.6 10*3/uL (ref 1.4–7.7)
Neutrophils Relative %: 45.8 % (ref 43.0–77.0)
Platelets: 142 10*3/uL — ABNORMAL LOW (ref 150.0–400.0)
RBC: 4.13 Mil/uL (ref 3.87–5.11)
RDW: 13.2 % (ref 11.5–15.5)
WBC: 5.7 10*3/uL (ref 4.0–10.5)

## 2024-02-29 LAB — COMPREHENSIVE METABOLIC PANEL WITH GFR
ALT: 15 U/L (ref 0–35)
AST: 25 U/L (ref 0–37)
Albumin: 4 g/dL (ref 3.5–5.2)
Alkaline Phosphatase: 66 U/L (ref 39–117)
BUN: 25 mg/dL — ABNORMAL HIGH (ref 6–23)
CO2: 29 meq/L (ref 19–32)
Calcium: 9.5 mg/dL (ref 8.4–10.5)
Chloride: 106 meq/L (ref 96–112)
Creatinine, Ser: 1.52 mg/dL — ABNORMAL HIGH (ref 0.40–1.20)
GFR: 30.99 mL/min — ABNORMAL LOW (ref >60.00)
Glucose, Bld: 148 mg/dL — ABNORMAL HIGH (ref 70–99)
Potassium: 4.3 meq/L (ref 3.5–5.1)
Sodium: 141 meq/L (ref 135–145)
Total Bilirubin: 0.6 mg/dL (ref 0.2–1.2)
Total Protein: 6.6 g/dL (ref 6.0–8.3)

## 2024-02-29 LAB — BRAIN NATRIURETIC PEPTIDE: Pro B Natriuretic peptide (BNP): 438 pg/mL — ABNORMAL HIGH (ref 0.0–100.0)

## 2024-02-29 MED ORDER — FUROSEMIDE 20 MG PO TABS: 20.0000 mg | ORAL_TABLET | Freq: Every day | ORAL | 3 refills | Status: DC

## 2024-02-29 NOTE — Assessment & Plan Note (Signed)
 Discussed that refill of alendronate was sent yesterday.

## 2024-02-29 NOTE — Patient Instructions (Addendum)
 I have sent in lasix  20mg  for you to take once daily in the morning as needed for leg swelling.  This medication will cause you to have to urinate more often, but that is how it will get rid of the extra fluid.   We are checking labs today, will be in contact with any results that require further attention  Follow-up with me for new or worsening symptoms.

## 2024-02-29 NOTE — Assessment & Plan Note (Signed)
 CMP today Continue current medication regimen

## 2024-02-29 NOTE — Assessment & Plan Note (Signed)
 CBC, CMP, BNP Lasix  20 mg once daily as needed for swelling to pharmacy

## 2024-02-29 NOTE — Assessment & Plan Note (Signed)
 Mildly elevated, likely related to swelling Continue current med regimen, maintain adequate hydration

## 2024-02-29 NOTE — Assessment & Plan Note (Signed)
 Stable, continue current medication regimen

## 2024-02-29 NOTE — Progress Notes (Signed)
 Acute Office Visit  Subjective:     Patient ID: Monique [name], female    DOB: August 09, 1936, 88 y.o.   MRN: [id number]  Chief Complaint  Patient presents with   Foot Swelling   Joint Swelling    HPI Patient is in today for evaluation of bilateral lower leg swelling for the last 4 days. States that she just took her blood pressure medications Norvasc  10 mg and metoprolol  50 mg right before she came into the office today. States that started in the left ankle, she has tried support stockings with no relief. Reports that the increased swelling is causing some tightness and pain. Has history of CHF, hypertension, chronic anticoagulation. Denies any areas of erythema, tenderness, localized swelling. Denies shortness of breath, cough, palpitations, decreased urinary output, new medications, other symptoms today.  ROS Per HPI      Objective:    BP (!) 146/74   Pulse (!) 57   Temp 98.5 F (36.9 C)   Ht 5' 5 (1.651 m)   Wt 128 lb 9.6 oz (58.3 kg)   SpO2 97%   BMI 21.40 kg/m    Physical Exam Vitals and nursing note reviewed.  Constitutional:      General: She is not in acute distress.    Appearance: She is normal weight.     Comments: Elderly  HENT:     Head: Normocephalic and atraumatic.     Right Ear: External ear normal.     Left Ear: External ear normal.     Nose: Nose normal.     Mouth/Throat:     Mouth: Mucous membranes are moist.     Pharynx: Oropharynx is clear.   Eyes:     Extraocular Movements: Extraocular movements intact.     Pupils: Pupils are equal, round, and reactive to light.    Cardiovascular:     Rate and Rhythm: Bradycardia present. Rhythm irregular.     Heart sounds: Normal heart sounds.     Comments: Bilat dorsal pulses +1 Bilat radial pulses +2 Pulmonary:     Effort: Pulmonary effort is normal. No respiratory distress.     Breath sounds: Normal breath sounds. No wheezing, rhonchi or rales.   Musculoskeletal:     Cervical back:  Normal range of motion.     Right lower leg: Edema (+2 pitting) present.     Left lower leg: Edema (+2 pitting) present.  Lymphadenopathy:     Cervical: No cervical adenopathy.   Skin:    General: Skin is warm and dry.   Neurological:     General: No focal deficit present.     Mental Status: She is alert and oriented to person, place, and time.   Psychiatric:        Mood and Affect: Mood normal.        Thought Content: Thought content normal.     Results for orders placed or performed in visit on 02/29/24  CBC with Differential/Platelet  Result Value Ref Range   WBC 5.7 4.0 - 10.5 K/uL   RBC 4.13 3.87 - 5.11 Mil/uL   Hemoglobin 13.2 12.0 - 15.0 g/dL   HCT 59.6 63.9 - 53.9 %   MCV 97.6 78.0 - 100.0 fl   MCHC 32.8 30.0 - 36.0 g/dL   RDW 86.7 88.4 - 84.4 %   Platelets 142.0 (L) 150.0 - 400.0 K/uL   Neutrophils Relative % 45.8 43.0 - 77.0 %   Lymphocytes Relative 45.3 12.0 - 46.0 %  Monocytes Relative 8.0 3.0 - 12.0 %   Eosinophils Relative 0.6 0.0 - 5.0 %   Basophils Relative 0.3 0.0 - 3.0 %   Neutro Abs 2.6 1.4 - 7.7 K/uL   Lymphs Abs 2.6 0.7 - 4.0 K/uL   Monocytes Absolute 0.5 0.1 - 1.0 K/uL   Eosinophils Absolute 0.0 0.0 - 0.7 K/uL   Basophils Absolute 0.0 0.0 - 0.1 K/uL  Comprehensive metabolic panel with GFR  Result Value Ref Range   Sodium 141 135 - 145 mEq/L   Potassium 4.3 3.5 - 5.1 mEq/L   Chloride 106 96 - 112 mEq/L   CO2 29 19 - 32 mEq/L   Glucose, Bld 148 (H) 70 - 99 mg/dL   BUN 25 (H) 6 - 23 mg/dL   Creatinine, Ser 8.47 (H) 0.40 - 1.20 mg/dL   Total Bilirubin 0.6 0.2 - 1.2 mg/dL   Alkaline Phosphatase 66 39 - 117 U/L   AST 25 0 - 37 U/L   ALT 15 0 - 35 U/L   Total Protein 6.6 6.0 - 8.3 g/dL   Albumin 4.0 3.5 - 5.2 g/dL   GFR 69.00 (L) >39.99 mL/min   Calcium  9.5 8.4 - 10.5 mg/dL  B Nat Peptide  Result Value Ref Range   Pro B Natriuretic peptide (BNP) 438.0 (H) 0.0 - 100.0 pg/mL        Assessment & Plan:   Chronic diastolic (congestive)  heart failure (HCC) Assessment & Plan: CBC, BN P, CMP today May continue support stockings New Rx for Lasix  20 mg once daily as needed for swelling to pharmacy   Orders: -     CBC with Differential/Platelet -     Comprehensive metabolic panel with GFR -     Brain natriuretic peptide -     Furosemide ; Take 1 tablet (20 mg total) by mouth daily.  Dispense: 30 tablet; Refill: 3  Essential hypertension Assessment & Plan: Mildly elevated, likely related to swelling Continue current med regimen, maintain adequate hydration   Hyperlipidemia associated with type 2 diabetes mellitus (HCC) Assessment & Plan: Stable, continue current medication regimen   Stage 3b chronic kidney disease (HCC) Assessment & Plan: CMP today Continue current medication regimen  Orders: -     Comprehensive metabolic panel with GFR -     Furosemide ; Take 1 tablet (20 mg total) by mouth daily.  Dispense: 30 tablet; Refill: 3  Leg swelling Assessment & Plan: CBC, CMP, BNP Lasix  20 mg once daily as needed for swelling to pharmacy  Orders: -     CBC with Differential/Platelet -     Comprehensive metabolic panel with GFR -     Brain natriuretic peptide -     Furosemide ; Take 1 tablet (20 mg total) by mouth daily.  Dispense: 30 tablet; Refill: 3  Age-related osteoporosis without current pathological fracture Assessment & Plan: Discussed that refill of alendronate  was sent yesterday.      Meds ordered this encounter  Medications   furosemide  (LASIX ) 20 MG tablet    Sig: Take 1 tablet (20 mg total) by mouth daily.    Dispense:  30 tablet    Refill:  3    Return if symptoms worsen or fail to improve, for With PCP as scheduled.  Corean LITTIE Ku, FNP

## 2024-02-29 NOTE — Telephone Encounter (Signed)
 Patient has already been contacted in regards to this

## 2024-02-29 NOTE — Assessment & Plan Note (Signed)
 CBC, BN P, CMP today May continue support stockings New Rx for Lasix  20 mg once daily as needed for swelling to pharmacy

## 2024-03-01 ENCOUNTER — Telehealth: Payer: Self-pay

## 2024-03-01 NOTE — Telephone Encounter (Unsigned)
 Copied from CRM [id number]. Topic: Clinical - Lab/Test Results >> Mar 01, 2024  1:25 PM Robinson H wrote: Reason for CRM: Patient returning call to [facility] regarding lab results, please reach back out.  Micki [phone]

## 2024-03-05 NOTE — Telephone Encounter (Signed)
Spoke with patient regarding labs.

## 2024-03-05 NOTE — Telephone Encounter (Signed)
 LVM for patient to return call.

## 2024-03-31 ENCOUNTER — Other Ambulatory Visit: Payer: Self-pay | Admitting: Internal Medicine

## 2024-03-31 DIAGNOSIS — I259 Chronic ischemic heart disease, unspecified: Secondary | ICD-10-CM

## 2024-03-31 DIAGNOSIS — I421 Obstructive hypertrophic cardiomyopathy: Secondary | ICD-10-CM

## 2024-03-31 DIAGNOSIS — E782 Mixed hyperlipidemia: Secondary | ICD-10-CM

## 2024-04-03 DIAGNOSIS — S8992XA Unspecified injury of left lower leg, initial encounter: Secondary | ICD-10-CM | POA: Diagnosis not present

## 2024-04-03 DIAGNOSIS — M25562 Pain in left knee: Secondary | ICD-10-CM | POA: Diagnosis not present

## 2024-04-09 ENCOUNTER — Other Ambulatory Visit: Payer: Self-pay | Admitting: Internal Medicine

## 2024-04-09 DIAGNOSIS — I480 Paroxysmal atrial fibrillation: Secondary | ICD-10-CM

## 2024-04-09 DIAGNOSIS — I2699 Other pulmonary embolism without acute cor pulmonale: Secondary | ICD-10-CM

## 2024-04-09 NOTE — Telephone Encounter (Signed)
 Prescription refill request for Xarelto  received.  Indication:afib Last office visit:4/25 Weight:58.3  kg Age:88 Scr:1.52  6/25 CrCl:24.45  ml/min  Prescription refilled

## 2024-04-16 NOTE — Progress Notes (Signed)
 Cardiology Office Note    Date:  04/18/2024   ID:  Monique [name], Monique [name] Nov 25, 1935, MRN [id number]  PCP:  Rollene Almarie LABOR, MD  Cardiologist: Dr. Okey  Chief Complaint: Follow up for  PAF , HCM and CP     History of Present Illness:   Monique [name] is a 88 y.o. female with hx of HCM,  HTN, SVT(rx metoprolol ), HL, CP, PAF, bilateral PE, DVT, CKD stage III, .  The patient  has undergone several stress test (nuclear, 2016, 2019).  In   April 2022  MRI stress test  showed a normal hyperdynamic response to stress.  MRI showed a ventricular septum measuring at 15 mm.  There was less than 1% late gadolinium enhancement noted at the septum base.  LVEF 72%.  April 2023:  Chest pain   CCTA  Calcium  score 0   Normal coronary arteries  I saw the pt in Aug 2024   She was last seen in cardiology by E Daneen in April 2025 Since seen she notes occasional L sided CP   Episodes last seconds   She also notes  occasional fluttering   Not too often   Last 20 min  No dizziness     Never taken pulse     Past Medical History:  Diagnosis Date   Abdominal pain 11/18/2015   Acute respiratory failure with hypoxia (HCC) 06/27/2014   Breast pain, left 10/11/2016   Cerebral aneurysm 2000 06/27/2014   Chest pain 10/30/2014   CHF (congestive heart failure) (HCC)    Chronic anticoagulation 06/2014   Coumadin  started after bilateral PE   Chronic kidney disease, stage III (moderate) (HCC) 11/21/2014   Clotting disorder (HCC)    right was removed, still have left cataract   Diabetes (HCC)    Dizziness and giddiness 11/28/2014   DVT of lower extremity (deep venous thrombosis) (HCC) 11/28/2014   Dysuria 07/07/2014   Elevated troponin 11/28/2014   Fatty liver    Gastritis 05/16/2012   Chronic dyspepsia on PPI daily.    Headache, acute 06/08/2010   Qualifier: Diagnosis of  By: Harlow MD, Ozell BRAVO    Heart murmur    Hematochezia 11/18/2015   History of fibromuscular dysplasia 06/27/2014   HTN (hypertension)     Essential   Hyperlipidemia    Hypertrophic obstructive cardiomyopathy (HOCM) (HCC) 05/20/2008   CMR 4/22: EF 72, small amount of patchy LGE in basal septum c/w HCM, asymmetric basal septal hypertrophy (15 mm) c/w HCM, no ischemia, ascending aorta 38 mm   Kidney stones    LVH (left ventricular hypertrophy)    Orthostatic hypotension 11/28/2014   PAF (paroxysmal atrial fibrillation) (HCC) 2000   Palpitations 11/28/2014   PAROXYSMAL ATRIAL FIBRILLATION 05/20/2008   Qualifier: Diagnosis of  By: Harlow MD, Ozell BRAVO    PE (pulmonary embolism) 06/2014   Bilateral   Postmenopausal osteoporosis 04/2019   T score -3.3   Pulmonary embolism, bilateral (HCC) 06/27/2014   SOB (shortness of breath) 06/27/2014   Subarachnoid hemorrhage (HCC) 2000   UTI (urinary tract infection) 01/04/2015    Past Surgical History:  Procedure Laterality Date   ABDOMINAL HYSTERECTOMY  2000   Brain hemorrage     2000   CARDIAC CATHETERIZATION  05/2008   Nl cors, no gradient, EF 60%    Current Medications: Prior to Admission medications   Medication Sig Start Date End Date Taking? Authorizing Provider  amLODipine  (NORVASC ) 5 MG tablet Take 1 tablet (5 mg total)  by mouth 2 (two) times daily. 12/02/16   Okey Vina GAILS, MD  disopyramide  (NORPACE ) 150 MG capsule Take 1 capsule (150 mg total) by mouth 2 (two) times daily. 01/13/17   Okey Vina GAILS, MD  metoprolol  tartrate (LOPRESSOR ) 25 MG tablet Take 1 tablet (25 mg total) by mouth 2 (two) times daily. 12/06/16   Okey Vina GAILS, MD  mirtazapine  (REMERON ) 15 MG tablet TAKE 1 TABLET (15 MG TOTAL) BY MOUTH AT BEDTIME. 06/20/17   Rollene Almarie LABOR, MD  montelukast  (SINGULAIR ) 10 MG tablet Take 1 tablet (10 mg total) by mouth at bedtime. 06/20/17   Rollene Almarie LABOR, MD  nitroGLYCERIN  (NITROSTAT ) 0.4 MG SL tablet Place 1 tablet (0.4 mg total) under the tongue every 5 (five) minutes as needed for chest pain. 03/30/15   Okey Vina GAILS, MD  pantoprazole  (PROTONIX ) 40 MG tablet  Take 1 tablet (40 mg total) by mouth daily. Need annual visit of further refills 06/07/17   Rollene Almarie LABOR, MD  Rivaroxaban  (XARELTO ) 15 MG TABS tablet Take 1 tablet (15 mg total) by mouth daily with supper. 01/17/17   Okey Vina GAILS, MD  sertraline  (ZOLOFT ) 50 MG tablet TAKE 1 TABLET (50 MG TOTAL) BY MOUTH DAILY. 02/24/15   Rollene Almarie LABOR, MD  simvastatin  (ZOCOR ) 40 MG tablet TAKE 1 TABLET (40 MG TOTAL) BY MOUTH AT BEDTIME. 01/13/17   Okey Vina GAILS, MD    Allergies:   Patient has no known allergies.   Social History   Socioeconomic History   Marital status: Divorced    Spouse name: Not on file   Number of children: 4   Years of education: 12   Highest education level: Not on file  Occupational History   Occupation: Retired Doctor, general practice  Tobacco Use   Smoking status: Former    Current packs/day: 0.00    Types: Cigarettes    Quit date: 09/06/1967    Years since quitting: 56.6   Smokeless tobacco: Never  Vaping Use   Vaping status: Never Used  Substance and Sexual Activity   Alcohol use: No    Alcohol/week: 0.0 standard drinks of alcohol   Drug use: No   Sexual activity: Not Currently    Comment: 1st intercourse 88yo-Fewer than 5 partners  Other Topics Concern   Not on file  Social History Narrative   HSG. Married '61 - '88/divorced. 3 sons - '74, '59, '62, 1 daughter '63; 5 grandchildren. 10/06/20 Lives alone in two story home.    She does not drive.   Retired from [employer].   Social Drivers of Corporate investment banker Strain: Low Risk  (04/26/2023)   Overall Financial Resource Strain (CARDIA)    Difficulty of Paying Living Expenses: Not hard at all  Food Insecurity: No Food Insecurity (04/26/2023)   Hunger Vital Sign    Worried About Running Out of Food in the Last Year: Never true    Ran Out of Food in the Last Year: Never true  Transportation Needs: No Transportation Needs (04/26/2023)   PRAPARE - Scientist, research (physical sciences) (Medical): No    Lack of Transportation (Non-Medical): No  Physical Activity: Sufficiently Active (04/26/2023)   Exercise Vital Sign    Days of Exercise per Week: 5 days    Minutes of Exercise per Session: 30 min  Stress: No Stress Concern Present (04/26/2023)   [employer] of Occupational Health - Occupational Stress Questionnaire    Feeling of Stress : Not  at all  Social Connections: Moderately Integrated (04/26/2023)   Social Connection and Isolation Panel    Frequency of Communication with Friends and Family: More than three times a week    Frequency of Social Gatherings with Friends and Family: More than three times a week    Attends Religious Services: More than 4 times per year    Active Member of [employer] or Organizations: Yes    Attends Engineer, structural: More than 4 times per year    Marital Status: Divorced     Family History:  The patient's family history includes Arrhythmia in her mother and sister; Breast cancer (age of onset: 22) in her daughter; Breast cancer (age of onset: 27) in her mother; Cervical cancer in her sister; Colon cancer in her sister; Heart disease in her mother; Stroke in her mother.   ROS:   Please see the history of present illness.    ROS All other systems reviewed and are negative.   PHYSICAL EXAM:   VS:  BP (!) 150/83   Pulse (!) 54   Ht 5' 6 (1.676 m)   Wt 124 lb 12.8 oz (56.6 kg)   SpO2 96%   BMI 20.14 kg/m    GEN: Pt is in  no acute distress  HEENT: normal  Neck: JVP is not elevated   No bruits  Cardiac: RRR;No murmurs  Respiratory:  clear to auscultation  GI: soft, nontender, no masses Ext  Tr LE edema    Wt Readings from Last 3 Encounters:  04/18/24 124 lb 12.8 oz (56.6 kg)  02/29/24 128 lb 9.6 oz (58.3 kg)  02/06/24 129 lb (58.5 kg)      Studies/Labs Reviewed:   EKG:  EKG is not ordered today    Recent Labs: 04/21/2023: TSH 1.180 02/29/2024: ALT 15; BUN 25; Creatinine, Ser 1.52; Hemoglobin  13.2; Platelets 142.0; Potassium 4.3; Pro B Natriuretic peptide (BNP) 438.0; Sodium 141   Lipid Panel    Component Value Date/Time   CHOL 141 02/06/2024 0902   CHOL 140 11/11/2016 0900   TRIG 70.0 02/06/2024 0902   HDL 72.80 02/06/2024 0902   HDL 51 11/11/2016 0900   CHOLHDL 2 02/06/2024 0902   VLDL 14.0 02/06/2024 0902   LDLCALC 54 02/06/2024 0902   LDLCALC 52 04/17/2020 1451    Additional studies/ records that were reviewed today include:   Echo March 2025   1. Left ventricular ejection fraction, by estimation, is 70 to 75%. The  left ventricle has hyperdynamic function. The left ventricle has no  regional wall motion abnormalities. There is moderate concentric left  ventricular hypertrophy. Left ventricular  diastolic parameters are consistent with Grade I diastolic dysfunction  (impaired relaxation). The average left ventricular global longitudinal  strain is -19.0 %. The global longitudinal strain is normal.   2. Right ventricular systolic function is normal. The right ventricular  size is normal. Tricuspid regurgitation signal is inadequate for assessing  PA pressure.   3. Left atrial size was severely dilated.   4. The mitral valve is normal in structure. Trivial mitral valve  regurgitation. No evidence of mitral stenosis.   5. The aortic valve is tricuspid. Aortic valve regurgitation is not  visualized. Aortic valve sclerosis/calcification is present, without any  evidence of aortic stenosis.   6. There is mild dilatation of the ascending aorta, measuring 40 mm.   7. The inferior vena cava is normal in size with greater than 50%  respiratory variability, suggesting right atrial pressure  of 3 mmHg.   Monitor  May 2024 Predominant rhythm:  Sinus rhythm   Rates 41 to 87 bpm  Average HR 56 bpm Rare PVC, PAC. Short burst VT (4 beats at 143 bpm.   15 episodes of SVT, fasted for 6 beats at 156 bpm; longest SVT for 16 beats at 123 bpm   Triggered events/ diary entries  corresponded to sinus rhythm    CCTA  APril 2023  1. Coronary calcium  score of 0. This was 0 percentile for age and sex matched control.  2. Normal coronary origin with right dominance.  3. CAD-RADS 0. No evidence of CAD (0%). Consider non-atherosclerotic causes of chest pain.   Echo   Jan 2021  1. Left ventricular ejection fraction, by visual estimation, is 65 to 70%. The left ventricle has normal function. There is mildly increased left ventricular hypertrophy. 2. Elevated left atrial pressure. 3. Left ventricular diastolic parameters are consistent with Grade II diastolic dysfunction (pseudonormalization). 4. The left ventricle has no regional wall motion abnormalities. 5. Intracavitary gradient up to 5 mmHG. 6. Global right ventricle has normal systolic function.The right ventricular size is normal. No increase in right ventricular wall thickness. 7. Left atrial size was mildly dilated. 8. The mitral valve is grossly normal. Trivial mitral valve regurgitation. 9. The tricuspid valve is grossly normal. 10. The aortic valve is tricuspid. Aortic valve regurgitation is not visualized. No evidence of aortic valve sclerosis or stenosis. 11. Mildly elevated pulmonary artery systolic pressure. 12. The tricuspid regurgitant velocity is 3.00 m/s, and with an assumed right atrial pressure of 10 mmHg, the estimated right ventricular systolic pressure is mildly elevated at 45.9 mmHg. In comparison to the previous echocardiogram(s): EF remains unchanged on this study. Intracavitary gradient ~5 mmHG on the present study, and noted to be up to 34 mmHG on the prior study. No obvious asymmetric hypertrophy or SAM on this study. Compared to 09/08/2017.  Myovue Dec 2020  The left ventricular ejection fraction is normal (55-65%). Nuclear stress EF: 65%. No T wave inversion was noted during stress. There was no ST segment deviation noted during stress. This is a low risk study.   Normal  perfusion. LVEF 65% with normal wall motion. This is a low risk study.   ASSESSMENT & PLAN:   1 CP   Atypical   Normal coronary arteries on CCTA  Follow       2  HOCM  In past LV was very thick, hyperdynamic with increased gradient thorugh LVOT  Stress MRI  in 2022 showed  IVS of 15 mm, meeting criteria for [facility]  an only mild patchy LGE She has been on Norpace  for years   Would continue        2 SVT.  One episode in 2022  B BLOcker increased at that time   Pt denies   3  PAF   No recent recurrenc  CHADSVASc is 4  Continue Xarelto    4. HTN BP elevated  on arrival  Was 130/70 on my check     5  Hx of  PE  Keep on Xarelto     6. HLD  LDL 54  HDL 73  Keep on atorvastatin   7  DM  Last A1C 6.8 (June 2025)     Medication Adjustments/Labs and Tests Ordered: Current medicines are reviewed at length with the patient today.  Concerns regarding medicines are outlined above.  Medication changes, Labs and Tests ordered today are listed in the Patient Instructions below.  Patient Instructions  Medication Instructions:  Your physician recommends that you continue on your current medications as directed. Please refer to the Current Medication list given to you today.  *If you need a refill on your cardiac medications before your next appointment, please call your pharmacy*  Lab Work: TODAY: BMET, BNP If you have labs (blood work) drawn today and your tests are completely normal, you will receive your results only by: MyChart Message (if you have MyChart) OR A paper copy in the mail If you have any lab test that is abnormal or we need to change your treatment, we will call you to review the results.  Follow-Up: At [facility], you and your health needs are our priority.  As part of our continuing mission to provide you with exceptional heart care, our providers are all part of one team.  This team includes your primary Cardiologist (physician) and Advanced Practice Providers or APPs  (Physician Assistants and Nurse Practitioners) who all work together to provide you with the care you need, when you need it.  Your next appointment:   6 month(s)  Provider:   Vina Gull, MD   We recommend signing up for the patient portal called MyChart.  Sign up information is provided on this After Visit Summary.  MyChart is used to connect with patients for Virtual Visits (Telemedicine).  Patients are able to view lab/test results, encounter notes, upcoming appointments, etc.  Non-urgent messages can be sent to your provider as well.    To learn more about what you can do with MyChart, go to [email or web address].   Signed, Vina Gull, MD  04/18/2024  [facility] Phone: [phone]; Fax: [phone]

## 2024-04-18 ENCOUNTER — Encounter: Payer: Self-pay | Admitting: Internal Medicine

## 2024-04-18 ENCOUNTER — Ambulatory Visit: Attending: Internal Medicine | Admitting: Internal Medicine

## 2024-04-18 VITALS — BP 150/83 | HR 54 | Ht 66.0 in | Wt 124.8 lb

## 2024-04-18 DIAGNOSIS — I10 Essential (primary) hypertension: Secondary | ICD-10-CM | POA: Diagnosis not present

## 2024-04-18 DIAGNOSIS — R0600 Dyspnea, unspecified: Secondary | ICD-10-CM | POA: Diagnosis not present

## 2024-04-18 NOTE — Patient Instructions (Signed)
 Medication Instructions:  Your physician recommends that you continue on your current medications as directed. Please refer to the Current Medication list given to you today.  *If you need a refill on your cardiac medications before your next appointment, please call your pharmacy*  Lab Work: TODAY: BMET, BNP If you have labs (blood work) drawn today and your tests are completely normal, you will receive your results only by: MyChart Message (if you have MyChart) OR A paper copy in the mail If you have any lab test that is abnormal or we need to change your treatment, we will call you to review the results.  Follow-Up: At [facility], you and your health needs are our priority.  As part of our continuing mission to provide you with exceptional heart care, our providers are all part of one team.  This team includes your primary Cardiologist (physician) and Advanced Practice Providers or APPs (Physician Assistants and Nurse Practitioners) who all work together to provide you with the care you need, when you need it.  Your next appointment:   6 month(s)  Provider:   Vina Gull, MD   We recommend signing up for the patient portal called MyChart.  Sign up information is provided on this After Visit Summary.  MyChart is used to connect with patients for Virtual Visits (Telemedicine).  Patients are able to view lab/test results, encounter notes, upcoming appointments, etc.  Non-urgent messages can be sent to your provider as well.    To learn more about what you can do with MyChart, go to [email or web address].

## 2024-04-19 ENCOUNTER — Ambulatory Visit: Payer: Self-pay | Admitting: Internal Medicine

## 2024-04-19 LAB — BASIC METABOLIC PANEL WITH GFR
BUN/Creatinine Ratio: 19 (ref 12–28)
BUN: 29 mg/dL — ABNORMAL HIGH (ref 8–27)
CO2: 23 mmol/L (ref 20–29)
Calcium: 9.6 mg/dL (ref 8.7–10.3)
Chloride: 103 mmol/L (ref 96–106)
Creatinine, Ser: 1.5 mg/dL — ABNORMAL HIGH (ref 0.57–1.00)
Glucose: 125 mg/dL — ABNORMAL HIGH (ref 70–99)
Potassium: 4.2 mmol/L (ref 3.5–5.2)
Sodium: 142 mmol/L (ref 134–144)
eGFR: 34 mL/min/1.73 — ABNORMAL LOW (ref >59)

## 2024-04-19 LAB — PRO B NATRIURETIC PEPTIDE: NT-Pro BNP: 730 pg/mL (ref 0–738)

## 2024-04-28 ENCOUNTER — Other Ambulatory Visit: Payer: Self-pay | Admitting: Internal Medicine

## 2024-04-29 ENCOUNTER — Ambulatory Visit: Payer: Medicare Other

## 2024-04-30 ENCOUNTER — Ambulatory Visit (INDEPENDENT_AMBULATORY_CARE_PROVIDER_SITE_OTHER)

## 2024-04-30 VITALS — BP 140/81 | Ht 65.0 in | Wt 124.0 lb

## 2024-04-30 DIAGNOSIS — Z0000 Encounter for general adult medical examination without abnormal findings: Secondary | ICD-10-CM

## 2024-04-30 NOTE — Progress Notes (Signed)
 Subjective:   Monique [name] is a 88 y.o. who presents for a Medicare Wellness preventive visit.  As a reminder, Annual Wellness Visits don't include a physical exam, and some assessments may be limited, especially if this visit is performed virtually. We may recommend an in-person follow-up visit with your provider if needed.  Visit Complete: Virtual I connected with  Monique [name] on 04/30/24 by a audio enabled telemedicine application and verified that I am speaking with the correct person using two identifiers.  Patient Location: Home  Provider Location: Home Office  I discussed the limitations of evaluation and management by telemedicine. The patient expressed understanding and agreed to proceed.  Vital Signs: Because this visit was a virtual/telehealth visit, some criteria may be missing or patient reported. Any vitals not documented were not able to be obtained and vitals that have been documented are patient reported.  VideoDeclined- This patient declined Librarian, academic. Therefore the visit was completed with audio only.  Persons Participating in Visit: Patient.  AWV Questionnaire: No: Patient Medicare AWV questionnaire was not completed prior to this visit.  Cardiac Risk Factors include: advanced age (>87men, >9 women);hypertension;diabetes mellitus;dyslipidemia;Other (see comment), Risk factor comments: A-Fib     Objective:    Today's Vitals   04/30/24 0802  BP: (!) 140/81  Weight: 124 lb (56.2 kg)  Height: 5' 5 (1.651 m)   Body mass index is 20.63 kg/m.     04/30/2024    8:23 AM 06/13/2023   11:10 AM 04/26/2023    9:19 AM 04/29/2022    4:27 PM 04/28/2021    4:18 PM 08/21/2019    6:01 PM 09/07/2017    1:55 PM  Advanced Directives  Does Patient Have a Medical Advance Directive? No No No No No No No   Would patient like information on creating a medical advance directive?  No - Patient declined No - Patient declined No -  Patient declined No - Patient declined No - Patient declined No - Patient declined      Data saved with a previous flowsheet row definition    Current Medications (verified) Outpatient Encounter Medications as of 04/30/2024  Medication Sig   acetaminophen  (TYLENOL ) 500 MG tablet Take 1,000 mg by mouth every 6 (six) hours as needed for moderate pain or headache.   alendronate  (FOSAMAX ) 70 MG tablet Take 1 tablet (70 mg total) by mouth every 7 (seven) days. Take with a full glass of water on an empty stomach.   amLODipine  (NORVASC ) 10 MG tablet Take 1 tablet (10 mg total) by mouth daily.   atorvastatin  (LIPITOR) 10 MG tablet TAKE 1 TABLET BY MOUTH EVERY DAY   disopyramide  (NORPACE ) 150 MG capsule TAKE 1 CAPSULE (150 MG TOTAL) BY MOUTH 2 (TWO) TIMES DAILY. APPOINTMENT NEEDED FOR FUTURE REFILLS. THANK YOU.   furosemide  (LASIX ) 20 MG tablet Take 1 tablet (20 mg total) by mouth daily.   isosorbide  mononitrate (IMDUR ) 30 MG 24 hr tablet Take 1 tablet (30 mg total) by mouth daily.   lidocaine  (LIDODERM ) 5 % Place 1 patch onto the skin daily.   metFORMIN  (GLUCOPHAGE ) 500 MG tablet Take 1 tablet (500 mg total) by mouth daily with breakfast.   metoprolol  tartrate (LOPRESSOR ) 50 MG tablet TAKE 1 TABLET BY MOUTH TWICE A DAY   mirtazapine  (REMERON ) 15 MG tablet TAKE 1 TABLET BY MOUTH EVERYDAY AT BEDTIME   nitroGLYCERIN  (NITROSTAT ) 0.4 MG SL tablet PLACE 1 TABLET UNDER THE TONGUE EVERY 5 MINUTES AS  NEEDED FOR CHEST PAIN.   XARELTO  15 MG TABS tablet TAKE 1 TABLET BY MOUTH EVERY DAY WITH SUPPER   No facility-administered encounter medications on file as of 04/30/2024.    Allergies (verified) Patient has no known allergies.   History: Past Medical History:  Diagnosis Date   Abdominal pain 11/18/2015   Acute respiratory failure with hypoxia (HCC) 06/27/2014   Breast pain, left 10/11/2016   Cerebral aneurysm 2000 06/27/2014   Chest pain 10/30/2014   CHF (congestive heart failure) (HCC)    Chronic  anticoagulation 06/2014   Coumadin  started after bilateral PE   Chronic kidney disease, stage III (moderate) (HCC) 11/21/2014   Clotting disorder (HCC)    right was removed, still have left cataract   Diabetes (HCC)    Dizziness and giddiness 11/28/2014   DVT of lower extremity (deep venous thrombosis) (HCC) 11/28/2014   Dysuria 07/07/2014   Elevated troponin 11/28/2014   Fatty liver    Gastritis 05/16/2012   Chronic dyspepsia on PPI daily.    Headache, acute 06/08/2010   Qualifier: Diagnosis of  By: Harlow MD, Ozell BRAVO    Heart murmur    Hematochezia 11/18/2015   History of fibromuscular dysplasia 06/27/2014   HTN (hypertension)    Essential   Hyperlipidemia    Hypertrophic obstructive cardiomyopathy (HOCM) (HCC) 05/20/2008   CMR 4/22: EF 72, small amount of patchy LGE in basal septum c/w HCM, asymmetric basal septal hypertrophy (15 mm) c/w HCM, no ischemia, ascending aorta 38 mm   Kidney stones    LVH (left ventricular hypertrophy)    Orthostatic hypotension 11/28/2014   PAF (paroxysmal atrial fibrillation) (HCC) 2000   Palpitations 11/28/2014   PAROXYSMAL ATRIAL FIBRILLATION 05/20/2008   Qualifier: Diagnosis of  By: Harlow MD, Ozell BRAVO    PE (pulmonary embolism) 06/2014   Bilateral   Postmenopausal osteoporosis 04/2019   T score -3.3   Pulmonary embolism, bilateral (HCC) 06/27/2014   SOB (shortness of breath) 06/27/2014   Subarachnoid hemorrhage (HCC) 2000   UTI (urinary tract infection) 01/04/2015   Past Surgical History:  Procedure Laterality Date   ABDOMINAL HYSTERECTOMY  2000   Brain hemorrage     2000   CARDIAC CATHETERIZATION  05/2008   Nl cors, no gradient, EF 60%   Family History  Problem Relation Age of Onset   Breast cancer Mother 78       Deceased   Stroke Mother    Arrhythmia Mother    Heart disease Mother    Colon cancer Sister    Cervical cancer Sister    Arrhythmia Sister    Breast cancer Daughter 4   Esophageal cancer Neg Hx    Pancreatic cancer Neg  Hx    Rectal cancer Neg Hx    Stomach cancer Neg Hx    Social History   Socioeconomic History   Marital status: Divorced    Spouse name: Not on file   Number of children: 4   Years of education: 12   Highest education level: Not on file  Occupational History   Occupation: Retired Doctor, general practice  Tobacco Use   Smoking status: Former    Current packs/day: 0.00    Types: Cigarettes    Quit date: 09/06/1967    Years since quitting: 56.6   Smokeless tobacco: Never  Vaping Use   Vaping status: Never Used  Substance and Sexual Activity   Alcohol use: No    Alcohol/week: 0.0 standard drinks of alcohol   Drug use: No  Sexual activity: Not Currently    Comment: 1st intercourse 88yo-Fewer than 5 partners  Other Topics Concern   Not on file  Social History Narrative   HSG. Married '61 - '88/divorced. 3 sons - '74, '59, '62, 1 daughter '63; 5 grandchildren. 10/06/20 Lives alone in two story home.    She does not drive.   Retired from [employer].      Lives alone/2025   Social Drivers of Health   Financial Resource Strain: Low Risk  (04/30/2024)   Overall Financial Resource Strain (CARDIA)    Difficulty of Paying Living Expenses: Not very hard  Food Insecurity: No Food Insecurity (04/30/2024)   Hunger Vital Sign    Worried About Running Out of Food in the Last Year: Never true    Ran Out of Food in the Last Year: Never true  Transportation Needs: No Transportation Needs (04/30/2024)   PRAPARE - Administrator, Civil Service (Medical): No    Lack of Transportation (Non-Medical): No  Physical Activity: Inactive (04/30/2024)   Exercise Vital Sign    Days of Exercise per Week: 0 days    Minutes of Exercise per Session: 0 min  Stress: No Stress Concern Present (04/30/2024)   [employer] of Occupational Health - Occupational Stress Questionnaire    Feeling of Stress: Not at all  Social Connections: Moderately Integrated (04/30/2024)   Social  Connection and Isolation Panel    Frequency of Communication with Friends and Family: More than three times a week    Frequency of Social Gatherings with Friends and Family: Once a week    Attends Religious Services: More than 4 times per year    Active Member of [employer] or Organizations: Yes    Attends Banker Meetings: Never    Marital Status: Divorced    Tobacco Counseling Counseling given: Not Answered    Clinical Intake:  Pre-visit preparation completed: Yes  Pain : No/denies pain     BMI - recorded: 20.63 Nutritional Status: BMI of 19-24  Normal Nutritional Risks: None Diabetes: Yes CBG done?: No Did pt. bring in CBG monitor from home?: No  Lab Results  Component Value Date   HGBA1C 6.8 (H) 02/06/2024   HGBA1C 6.7 (H) 08/08/2023   HGBA1C 6.6 (H) 04/21/2023     How often do you need to have someone help you when you read instructions, pamphlets, or other written materials from your doctor or pharmacy?: 1 - Never  Interpreter Needed?: No  Information entered by :: Nerida Boivin, RMA   Activities of Daily Living     04/30/2024    8:16 AM  In your present state of health, do you have any difficulty performing the following activities:  Hearing? 0  Vision? 0  Difficulty concentrating or making decisions? 0  Walking or climbing stairs? 0  Dressing or bathing? 0  Doing errands, shopping? 0  Comment son and daughter in law drives her around  Quarry manager and eating ? N  Using the Toilet? N  In the past six months, have you accidently leaked urine? N  Do you have problems with loss of bowel control? N  Managing your Medications? N  Managing your Finances? N  Housekeeping or managing your Housekeeping? N    Patient Care Team: Rollene Almarie LABOR, MD as PCP - General (Internal Medicine) Okey Vina GAILS, MD as PCP - Cardiology (Cardiology) Okey Vina GAILS, MD (Cardiology) Fate Morna SAILOR, [facility] (Inactive) as Pharmacist (Pharmacist) Cleatus Collar,  MD as Consulting Physician (Ophthalmology)  I have updated your Care Teams any recent Medical Services you may have received from other providers in the past year.     Assessment:   This is a routine wellness examination for Monique [name].  Hearing/Vision screen Hearing Screening - Comments:: Denies hearing difficulties   Vision Screening - Comments:: Need an eye doctor/recommendation    Goals Addressed             This Visit's Progress    Patient Stated       Not at this time/2025       Depression Screen     04/30/2024    8:28 AM 02/29/2024   10:21 AM 04/26/2023    9:21 AM 04/10/2023    9:03 AM 11/18/2022    8:50 AM 07/22/2022    9:32 AM 07/22/2022    9:24 AM  PHQ 2/9 Scores  PHQ - 2 Score 0 0 0 0 0 0 0  PHQ- 9 Score 3  2   1  0    Fall Risk     04/30/2024    8:26 AM 02/29/2024   10:21 AM 02/06/2024    8:42 AM 08/08/2023   10:09 AM 05/10/2023   10:06 AM  Fall Risk   Falls in the past year? 1 0 1 0 0  Number falls in past yr: 1 0 0 0 0  Injury with Fall? 1 0 0 0 0  Risk for fall due to : Impaired balance/gait No Fall Risks     Follow up Falls evaluation completed;Falls prevention discussed Falls evaluation completed  Falls evaluation completed     MEDICARE RISK AT HOME:  Medicare Risk at Home Any stairs in or around the home?: Yes (2 story house) If so, are there any without handrails?: No Home free of loose throw rugs in walkways, pet beds, electrical cords, etc?: Yes Adequate lighting in your home to reduce risk of falls?: Yes Life alert?: No Use of a cane, walker or w/c?: No Grab bars in the bathroom?: No Shower chair or bench in shower?: Yes Elevated toilet seat or a handicapped toilet?: Yes  TIMED UP AND GO:  Was the test performed?  No  Cognitive Function: 6CIT completed        04/26/2023    9:22 AM 04/29/2022    4:29 PM  6CIT Screen  What Year? 0 points 0 points  What month? 0 points 0 points  What time? 0 points 0 points  Count back from 20  0 points 0 points  Months in reverse 0 points 0 points  Repeat phrase 0 points 0 points  Total Score 0 points 0 points    Immunizations Immunization History  Administered Date(s) Administered   Fluad Quad(high Dose 65+) 07/16/2019, 06/19/2020, 06/28/2021, 07/22/2022   INFLUENZA, HIGH DOSE SEASONAL PF 10/11/2016, 06/20/2017, 07/16/2018   Influenza Split 05/15/2012   Influenza Whole 09/03/2009   Influenza,inj,Quad PF,6+ Mos 06/28/2014, 09/17/2015   Influenza-Unspecified 06/14/2023   PFIZER(Purple Top)SARS-COV-2 Vaccination 11/10/2019, 12/09/2019, 08/27/2020   Pneumococcal Conjugate-13 04/24/2015   Pneumococcal Polysaccharide-23 05/15/2012   Td 11/08/2002   Tdap 06/20/2017   Zoster, Live 04/09/2013    Screening Tests Health Maintenance  Topic Date Due   Zoster Vaccines- Shingrix (1 of 2) 03/19/1988   OPHTHALMOLOGY EXAM  11/22/2018   COVID-19 Vaccine (4 - 2024-25 season) 05/07/2023   Medicare Annual Wellness (AWV)  04/25/2024   INFLUENZA VACCINE  04/05/2024   HEMOGLOBIN A1C  08/07/2024   MAMMOGRAM  12/25/2024   FOOT EXAM  02/05/2025   DTaP/Tdap/Td (3 - Td or Tdap) 06/21/2027   Pneumococcal Vaccine: 50+ Years  Completed   DEXA SCAN  Completed   HPV VACCINES  Aged Out   Meningococcal B Vaccine  Aged Out    Health Maintenance  Health Maintenance Due  Topic Date Due   Zoster Vaccines- Shingrix (1 of 2) 03/19/1988   OPHTHALMOLOGY EXAM  11/22/2018   COVID-19 Vaccine (4 - 2024-25 season) 05/07/2023   Medicare Annual Wellness (AWV)  04/25/2024   INFLUENZA VACCINE  04/05/2024   Health Maintenance Items Addressed: See Nurse Notes at the end of this note  Additional Screening:  Vision Screening: Recommended annual ophthalmology exams for early detection of glaucoma and other disorders of the eye. Would you like a referral to an eye doctor? Yes    Dental Screening: Recommended annual dental exams for proper oral hygiene  Community Resource Referral / Chronic Care  Management: CRR required this visit?  No   CCM required this visit?  No   Plan:    I have personally reviewed and noted the following in the patient's chart:   Medical and social history Use of alcohol, tobacco or illicit drugs  Current medications and supplements including opioid prescriptions. Patient is not currently taking opioid prescriptions. Functional ability and status Nutritional status Physical activity Advanced directives List of other physicians Hospitalizations, surgeries, and ER visits in previous 12 months Vitals Screenings to include cognitive, depression, and falls Referrals and appointments  In addition, I have reviewed and discussed with patient certain preventive protocols, quality metrics, and best practice recommendations. A written personalized care plan for preventive services as well as general preventive health recommendations were provided to patient.   Kimmi Acocella L Rashaan Wyles, CMA   04/30/2024   After Visit Summary: (Mail) Due to this being a telephonic visit, the after visit summary with patients personalized plan was offered to patient via mail   Notes: Patient is due for a diabetic eye exam.  She is also due for a Shingrix vaccine.  Patient had no concerns to address today.

## 2024-04-30 NOTE — Patient Instructions (Signed)
 Monique [name] , Thank you for taking time out of your busy schedule to complete your Annual Wellness Visit with me. I enjoyed our conversation and look forward to speaking with you again next year. I, as well as your care team,  appreciate your ongoing commitment to your health goals. Please review the following plan we discussed and let me know if I can assist you in the future. Your Game plan/ To Do List    Referrals: If you haven't heard from the office you've been referred to, please reach out to them at the phone provided.  There are several Eye Doctors in your area. Here are a few that usually accept all insurance types:  [facility] [address] [place]  [place], [place] [address] [phone]  [facility] [address] [place], [place] [address] Phone: [phone]  [facility] [address] [place], [place] [address] Phone: [phone]  [facility] [address] [place], [place] [address] Phone: [phone]  [facility] [address] [place], [place] [address] Phone: [phone]  [facility] [address] [place], [place] [address] Phone: [phone]  Please let us  know if you require a referral for an eye exam appointment. Thank you!   Follow up Visits: We will see or speak with you next year for your Next Medicare AWV with our clinical staff Have you seen your provider in the last 6 months (3 months if uncontrolled diabetes)? Yes  Clinician Recommendations:  Aim for 30 minutes of exercise or brisk walking, 6-8 glasses of water, and 5 servings of fruits and vegetables each day. You are due for a Shingles vaccine and can get that done at your local pharmacy.  Remember when you get your eye examination, ask them to send results over to Dr. Marcel office.  Remember to call office to schedule your 6 month follow up visit also.      This is a list of the screenings recommended for you:   Health Maintenance  Topic Date Due   Zoster (Shingles) Vaccine (1 of 2) 03/19/1988   Eye exam for diabetics  11/22/2018   COVID-19 Vaccine (4 - 2024-25 season) 05/07/2023   Medicare Annual Wellness Visit  04/25/2024   Flu Shot  04/05/2024   Hemoglobin A1C  08/07/2024   Mammogram  12/25/2024   Complete foot exam   02/05/2025   DTaP/Tdap/Td vaccine (3 - Td or Tdap) 06/21/2027   Pneumococcal Vaccine for age over 77  Completed   DEXA scan (bone density measurement)  Completed   HPV Vaccine  Aged Out   Meningitis B Vaccine  Aged Out    Advanced directives: (Declined) Advance directive discussed with you today. Even though you declined this today, please call our office should you change your mind, and we can give you the proper paperwork for you to fill out. Advance Care Planning is important because it:  [x]  Makes sure you receive the medical care that is consistent with your values, goals, and preferences  [x]  It provides guidance to your family and loved ones and reduces their decisional burden about whether or not they are making the right decisions based on your wishes.  Follow the link provided in your after visit summary or read over the paperwork we have mailed to you to help you started getting your Advance Directives in place. If you need assistance in completing these, please reach out  to us  so that we can help you!  See attachments for Preventive Care and Fall Prevention Tips.

## 2024-05-23 ENCOUNTER — Ambulatory Visit: Admitting: Family Medicine

## 2024-05-23 ENCOUNTER — Ambulatory Visit: Payer: Self-pay

## 2024-05-23 NOTE — Telephone Encounter (Signed)
 FYI Only or Action Required?: FYI only for provider.  Patient was last seen in primary care on 02/29/2024 by Alvia Corean CROME, FNP.  Called Nurse Triage reporting Abdominal Pain.  Symptoms began several days ago.  Interventions attempted: Nothing.  Symptoms are: intermittent moderate RLQ abdominal pain and lower back pain, urinary urgency stable.  Triage Disposition: See Physician Within 24 Hours  Patient/caregiver understands and will follow disposition?: Yes            Copied from CRM [id number]. Topic: Clinical - Red Word Triage >> May 23, 2024  8:58 AM Revonda D wrote: Red Word that prompted transfer to Nurse Triage: pain   Pt stated she is experiencing pain below her stomach on the right side and would like to schedule an appt with provider.   ----------------------------------------------------------------------- From previous Reason for Contact - Scheduling: Patient/patient representative is calling to schedule an appointment. Refer to attachments for appointment information. Reason for Disposition  [1] MODERATE pain (e.g., interferes with normal activities) AND [2] pain comes and goes (cramps) AND [3] present > 24 hours  (Exception: Pain with Vomiting or Diarrhea - see that Guideline.)  Answer Assessment - Initial Assessment Questions 1. LOCATION: Where does it hurt?      RLQ.  2. RADIATION: Does the pain shoot anywhere else? (e.g., chest, back)     Sometimes it goes across the lower abdomen.  3. ONSET: When did the pain begin? (e.g., minutes, hours or days ago)      Monday night, went away and came back last night.  4. SUDDEN: Gradual or sudden onset?     Sudden.  5. PATTERN Does the pain come and go, or is it constant?     Comes and goes.  6. SEVERITY: How bad is the pain?  (e.g., Scale 1-10; mild, moderate, or severe)     Yes, not as bad as last night (9/10). 5-6/10 currently.  7. RECURRENT SYMPTOM: Have you ever had this type of  stomach pain before? If Yes, ask: When was the last time? and What happened that time?      Yes, it's been a while since I had this pain like this at the bottom of my stomach and she forgot what the cause was.  8. CAUSE: What do you think is causing the stomach pain? (e.g., gallstones, recent abdominal surgery)     Could be related to her kidney stones.  9. RELIEVING/AGGRAVATING FACTORS: What makes it better or worse? (e.g., antacids, bending or twisting motion, bowel movement)     Alleviated when urinated. Nothing noticed that makes the pain worse.  10. OTHER SYMPTOMS: Do you have any other symptoms? (e.g., back pain, diarrhea, fever, urination pain, vomiting)       Urinary urgency last night, lower back pain intermittent. Denies any pain or burning when urinating, blood in urine, fever, nausea, vomiting.  11. PREGNANCY: Is there any chance you are pregnant? When was your last menstrual period?       N/A.  Protocols used: Abdominal Pain - Female-A-AH

## 2024-05-24 ENCOUNTER — Other Ambulatory Visit (INDEPENDENT_AMBULATORY_CARE_PROVIDER_SITE_OTHER)

## 2024-05-24 ENCOUNTER — Ambulatory Visit: Admitting: Internal Medicine

## 2024-05-24 ENCOUNTER — Ambulatory Visit: Payer: Self-pay | Admitting: Internal Medicine

## 2024-05-24 ENCOUNTER — Encounter: Payer: Self-pay | Admitting: Internal Medicine

## 2024-05-24 VITALS — BP 136/76 | HR 56 | Temp 98.0°F | Ht 65.0 in | Wt 128.8 lb

## 2024-05-24 DIAGNOSIS — I10 Essential (primary) hypertension: Secondary | ICD-10-CM | POA: Diagnosis not present

## 2024-05-24 DIAGNOSIS — Z23 Encounter for immunization: Secondary | ICD-10-CM

## 2024-05-24 DIAGNOSIS — R350 Frequency of micturition: Secondary | ICD-10-CM

## 2024-05-24 DIAGNOSIS — N1832 Chronic kidney disease, stage 3b: Secondary | ICD-10-CM | POA: Diagnosis not present

## 2024-05-24 LAB — URINALYSIS, ROUTINE W REFLEX MICROSCOPIC
Bilirubin Urine: NEGATIVE
Hgb urine dipstick: NEGATIVE
Leukocytes,Ua: NEGATIVE
Nitrite: NEGATIVE
RBC / HPF: NONE SEEN (ref 0–?)
Specific Gravity, Urine: 1.025 (ref 1.000–1.030)
Total Protein, Urine: NEGATIVE
Urine Glucose: 500 — AB
Urobilinogen, UA: 0.2 (ref 0.0–1.0)
pH: 5.5 (ref 5.0–8.0)

## 2024-05-24 MED ORDER — CEPHALEXIN 500 MG PO CAPS: 500.0000 mg | ORAL_CAPSULE | Freq: Three times a day (TID) | ORAL | 0 refills | Status: DC

## 2024-05-24 MED ORDER — CEFTRIAXONE SODIUM 1 G IJ SOLR
1.0000 g | Freq: Once | INTRAMUSCULAR | Status: AC
  Administered 2024-05-24: 1 g via INTRAMUSCULAR

## 2024-05-24 NOTE — Assessment & Plan Note (Signed)
 BP Readings from Last 3 Encounters:  05/24/24 136/76  04/30/24 (!) 140/81  04/18/24 (!) 150/83   Borderline elevated but improved, pt to continue medical treatment norvasc  10 qd

## 2024-05-24 NOTE — Assessment & Plan Note (Addendum)
 Mild to mod, has hx of chronic RLQ pain but can't r/o uti, has significant discomfort on palpation, now for rocephin  1 gm, alsofor antibx course cephalaxin 500 tid, declines lab or CT or ED referral,  to f/u any worsening symptoms or concerns

## 2024-05-24 NOTE — Assessment & Plan Note (Signed)
 Lab Results  Component Value Date   CREATININE 1.50 (H) 04/18/2024   Stable overall, cont to avoid nephrotoxins

## 2024-05-24 NOTE — Patient Instructions (Addendum)
 You had the flu shot today  You had the antibiotic shot today - Rocephin  1 gm  Please take all new medication as prescribed - the pill antibiotic (cephalexin )  Please continue all other medications as before, and refills have been done if requested.  Please have the pharmacy call with any other refills you may need.  Please keep your appointments with your specialists as you may have planned  Please go to the LAB at the blood drawing area for the tests to be done - just the urine testing today  You will be contacted by phone if any changes need to be made immediately.  Otherwise, you will receive a letter about your results with an explanation, but please check with MyChart first.

## 2024-05-24 NOTE — Progress Notes (Signed)
 Patient ID: Shady Padron, female   DOB: 16-May-1936, 88 y.o.   MRN: [id number]        Chief Complaint: follow up lower abd to rlq pain x 2 days improved with urination       HPI:  Amey Hossain is a 88 y.o. female here with c/o above, with feeling warm, and urinary frequency and lower back pain. No chills and Denies urinary symptoms such as urgency, flank pain, hematuria or n/v.  Pt denies chest pain, increased sob or doe, wheezing, orthopnea, PND, increased LE swelling, palpitations, dizziness or syncope.   Pt denies polydipsia, polyuria, or new focal neuro s/s.    Pt denies recent wt loss, night sweats, loss of appetite, or other constitutional symptoms  Denies worsening reflux, dysphagia bowel change or blood.  Due for flu shot has hx of ckd 3b stable, declines lab today   BP Readings from Last 3 Encounters:  05/24/24 136/76  04/30/24 (!) 140/81  04/18/24 (!) 150/83         Past Medical History:  Diagnosis Date   Abdominal pain 11/18/2015   Acute respiratory failure with hypoxia (HCC) 06/27/2014   Breast pain, left 10/11/2016   Cerebral aneurysm 2000 06/27/2014   Chest pain 10/30/2014   CHF (congestive heart failure) (HCC)    Chronic anticoagulation 06/2014   Coumadin  started after bilateral PE   Chronic kidney disease, stage III (moderate) (HCC) 11/21/2014   Clotting disorder (HCC)    right was removed, still have left cataract   Diabetes (HCC)    Dizziness and giddiness 11/28/2014   DVT of lower extremity (deep venous thrombosis) (HCC) 11/28/2014   Dysuria 07/07/2014   Elevated troponin 11/28/2014   Fatty liver    Gastritis 05/16/2012   Chronic dyspepsia on PPI daily.    Headache, acute 06/08/2010   Qualifier: Diagnosis of  By: Harlow MD, Ozell BRAVO    Heart murmur    Hematochezia 11/18/2015   History of fibromuscular dysplasia 06/27/2014   HTN (hypertension)    Essential   Hyperlipidemia    Hypertrophic obstructive cardiomyopathy (HOCM) (HCC) 05/20/2008   CMR 4/22: EF 72,  small amount of patchy LGE in basal septum c/w HCM, asymmetric basal septal hypertrophy (15 mm) c/w HCM, no ischemia, ascending aorta 38 mm   Kidney stones    LVH (left ventricular hypertrophy)    Orthostatic hypotension 11/28/2014   PAF (paroxysmal atrial fibrillation) (HCC) 2000   Palpitations 11/28/2014   PAROXYSMAL ATRIAL FIBRILLATION 05/20/2008   Qualifier: Diagnosis of  By: Harlow MD, Ozell BRAVO    PE (pulmonary embolism) 06/2014   Bilateral   Postmenopausal osteoporosis 04/2019   T score -3.3   Pulmonary embolism, bilateral (HCC) 06/27/2014   SOB (shortness of breath) 06/27/2014   Subarachnoid hemorrhage (HCC) 2000   UTI (urinary tract infection) 01/04/2015   Past Surgical History:  Procedure Laterality Date   ABDOMINAL HYSTERECTOMY  2000   Brain hemorrage     2000   CARDIAC CATHETERIZATION  05/2008   Nl cors, no gradient, EF 60%    reports that she quit smoking about 56 years ago. Her smoking use included cigarettes. She has never used smokeless tobacco. She reports that she does not drink alcohol and does not use drugs. family history includes Arrhythmia in her mother and sister; Breast cancer (age of onset: 15) in her daughter; Breast cancer (age of onset: 21) in her mother; Cervical cancer in her sister; Colon cancer in her sister; Heart disease in  her mother; Stroke in her mother. No Known Allergies Current Outpatient Medications on File Prior to Visit  Medication Sig Dispense Refill   acetaminophen  (TYLENOL ) 500 MG tablet Take 1,000 mg by mouth every 6 (six) hours as needed for moderate pain or headache.     alendronate  (FOSAMAX ) 70 MG tablet Take 1 tablet (70 mg total) by mouth every 7 (seven) days. Take with a full glass of water on an empty stomach. 12 tablet 3   amLODipine  (NORVASC ) 10 MG tablet Take 1 tablet (10 mg total) by mouth daily. 90 tablet 3   atorvastatin  (LIPITOR) 10 MG tablet TAKE 1 TABLET BY MOUTH EVERY DAY 90 tablet 1   disopyramide  (NORPACE ) 150 MG capsule  TAKE 1 CAPSULE (150 MG TOTAL) BY MOUTH 2 (TWO) TIMES DAILY. APPOINTMENT NEEDED FOR FUTURE REFILLS. THANK YOU. 180 capsule 3   furosemide  (LASIX ) 20 MG tablet Take 1 tablet (20 mg total) by mouth daily. 30 tablet 3   isosorbide  mononitrate (IMDUR ) 30 MG 24 hr tablet Take 1 tablet (30 mg total) by mouth daily. 90 tablet 3   lidocaine  (LIDODERM ) 5 % Place 1 patch onto the skin daily. 30 patch 11   magnesium oxide (MAG-OX) 400 (240 Mg) MG tablet Take 400 mg by mouth daily.     metFORMIN  (GLUCOPHAGE ) 500 MG tablet Take 1 tablet (500 mg total) by mouth daily with breakfast. 90 tablet 1   metoprolol  tartrate (LOPRESSOR ) 50 MG tablet TAKE 1 TABLET BY MOUTH TWICE A DAY 180 tablet 3   mirtazapine  (REMERON ) 15 MG tablet TAKE 1 TABLET BY MOUTH EVERYDAY AT BEDTIME 90 tablet 3   nitroGLYCERIN  (NITROSTAT ) 0.4 MG SL tablet PLACE 1 TABLET UNDER THE TONGUE EVERY 5 MINUTES AS NEEDED FOR CHEST PAIN. 25 tablet 6   XARELTO  15 MG TABS tablet TAKE 1 TABLET BY MOUTH EVERY DAY WITH SUPPER 30 tablet 5   No current facility-administered medications on file prior to visit.        ROS:  All others reviewed and negative.  Objective        PE:  BP 136/76   Pulse (!) 56   Temp 98 F (36.7 C)   Ht 5' 5 (1.651 m)   Wt 128 lb 12.8 oz (58.4 kg)   SpO2 98%   BMI 21.43 kg/m                 Constitutional: Pt appears mild ill in pain               HENT: Head: NCAT.                Right Ear: External ear normal.                 Left Ear: External ear normal.                Eyes: . Pupils are equal, round, and reactive to light. Conjunctivae and EOM are normal               Nose: without d/c or deformity               Neck: Neck supple. Gross normal ROM               Cardiovascular: Normal rate and regular rhythm.                 Pulmonary/Chest: Effort normal and breath sounds without rales or wheezing.  Abd:  Soft, low mid abd and near RLQ tender, no  ND, + BS, no organomegaly               Neurological:  Pt is alert. At baseline orientation, motor grossly intact               Skin: Skin is warm. No rashes, no other new lesions, LE edema - none               Psychiatric: Pt behavior is normal without agitation   Micro: none  Cardiac tracings I have personally interpreted today:  none  Pertinent Radiological findings (summarize): none   Lab Results  Component Value Date   WBC 5.7 02/29/2024   HGB 13.2 02/29/2024   HCT 40.3 02/29/2024   PLT 142.0 (L) 02/29/2024   GLUCOSE 125 (H) 04/18/2024   CHOL 141 02/06/2024   TRIG 70.0 02/06/2024   HDL 72.80 02/06/2024   LDLCALC 54 02/06/2024   ALT 15 02/29/2024   AST 25 02/29/2024   NA 142 04/18/2024   K 4.2 04/18/2024   CL 103 04/18/2024   CREATININE 1.50 (H) 04/18/2024   BUN 29 (H) 04/18/2024   CO2 23 04/18/2024   TSH 1.180 04/21/2023   INR 2.0 (H) 11/18/2015   HGBA1C 6.8 (H) 02/06/2024   MICROALBUR 2.4 (H) 02/06/2024   Assessment/Plan:  Fletcher Rathbun is a 88 y.o. Black or African American [2] female with  has a past medical history of Abdominal pain (11/18/2015), Acute respiratory failure with hypoxia (HCC) (06/27/2014), Breast pain, left (10/11/2016), Cerebral aneurysm 2000 (06/27/2014), Chest pain (10/30/2014), CHF (congestive heart failure) (HCC), Chronic anticoagulation (06/2014), Chronic kidney disease, stage III (moderate) (HCC) (11/21/2014), Clotting disorder (HCC), Diabetes (HCC), Dizziness and giddiness (11/28/2014), DVT of lower extremity (deep venous thrombosis) (HCC) (11/28/2014), Dysuria (07/07/2014), Elevated troponin (11/28/2014), Fatty liver, Gastritis (05/16/2012), Headache, acute (06/08/2010), Heart murmur, Hematochezia (11/18/2015), History of fibromuscular dysplasia (06/27/2014), HTN (hypertension), Hyperlipidemia, Hypertrophic obstructive cardiomyopathy (HOCM) (HCC) (05/20/2008), Kidney stones, LVH (left ventricular hypertrophy), Orthostatic hypotension (11/28/2014), PAF (paroxysmal atrial fibrillation) (HCC) (2000), Palpitations  (11/28/2014), PAROXYSMAL ATRIAL FIBRILLATION (05/20/2008), PE (pulmonary embolism) (06/2014), Postmenopausal osteoporosis (04/2019), Pulmonary embolism, bilateral (HCC) (06/27/2014), SOB (shortness of breath) (06/27/2014), Subarachnoid hemorrhage (HCC) (2000), and UTI (urinary tract infection) (01/04/2015).  Frequent urination Mild to mod, has hx of chronic RLQ pain but can't r/o uti, has significant discomfort on palpation, now for rocephin  1 gm, alsofor antibx course cephalaxin 500 tid, declines lab or CT or ED referral,  to f/u any worsening symptoms or concerns  Chronic kidney disease, stage III (moderate) Lab Results  Component Value Date   CREATININE 1.50 (H) 04/18/2024   Stable overall, cont to avoid nephrotoxins   Essential hypertension BP Readings from Last 3 Encounters:  05/24/24 136/76  04/30/24 (!) 140/81  04/18/24 (!) 150/83   Borderline elevated but improved, pt to continue medical treatment norvasc  10 qd  Followup: Return if symptoms worsen or fail to improve.  Lynwood Rush, MD 05/24/2024 7:18 PM  [facility]

## 2024-05-25 ENCOUNTER — Other Ambulatory Visit: Payer: Self-pay | Admitting: Internal Medicine

## 2024-05-25 LAB — URINE CULTURE
MICRO NUMBER:: 16991978
Result:: NO GROWTH
SPECIMEN QUALITY:: ADEQUATE

## 2024-05-26 ENCOUNTER — Other Ambulatory Visit: Payer: Self-pay | Admitting: Family Medicine

## 2024-05-26 DIAGNOSIS — N1832 Chronic kidney disease, stage 3b: Secondary | ICD-10-CM

## 2024-05-26 DIAGNOSIS — M7989 Other specified soft tissue disorders: Secondary | ICD-10-CM

## 2024-05-26 DIAGNOSIS — I5032 Chronic diastolic (congestive) heart failure: Secondary | ICD-10-CM

## 2024-06-11 ENCOUNTER — Other Ambulatory Visit: Payer: Self-pay | Admitting: Internal Medicine

## 2024-06-13 ENCOUNTER — Ambulatory Visit (INDEPENDENT_AMBULATORY_CARE_PROVIDER_SITE_OTHER): Admitting: Family Medicine

## 2024-06-13 ENCOUNTER — Ambulatory Visit: Payer: Self-pay

## 2024-06-13 ENCOUNTER — Encounter: Payer: Self-pay | Admitting: Family Medicine

## 2024-06-13 VITALS — BP 138/82 | HR 63 | Temp 98.0°F | Ht 65.0 in | Wt 130.8 lb

## 2024-06-13 DIAGNOSIS — M810 Age-related osteoporosis without current pathological fracture: Secondary | ICD-10-CM | POA: Diagnosis not present

## 2024-06-13 DIAGNOSIS — R296 Repeated falls: Secondary | ICD-10-CM

## 2024-06-13 DIAGNOSIS — Z87442 Personal history of urinary calculi: Secondary | ICD-10-CM

## 2024-06-13 DIAGNOSIS — R2689 Other abnormalities of gait and mobility: Secondary | ICD-10-CM | POA: Diagnosis not present

## 2024-06-13 DIAGNOSIS — R300 Dysuria: Secondary | ICD-10-CM | POA: Diagnosis not present

## 2024-06-13 DIAGNOSIS — R1031 Right lower quadrant pain: Secondary | ICD-10-CM | POA: Diagnosis not present

## 2024-06-13 DIAGNOSIS — N200 Calculus of kidney: Secondary | ICD-10-CM | POA: Insufficient documentation

## 2024-06-13 LAB — POCT URINALYSIS DIP (CLINITEK)
Bilirubin, UA: NEGATIVE
Glucose, UA: NEGATIVE mg/dL
Ketones, POC UA: NEGATIVE mg/dL
Leukocytes, UA: NEGATIVE
Nitrite, UA: NEGATIVE
POC PROTEIN,UA: NEGATIVE
Spec Grav, UA: <=1.005 — AB (ref 1.010–1.025)
Urobilinogen, UA: 0.2 U/dL
pH, UA: 6 (ref 5.0–8.0)

## 2024-06-13 NOTE — Telephone Encounter (Signed)
 FYI Only or Action Required?: FYI only for provider.  Patient was last seen in primary care on 05/24/2024 by Norleen Lynwood ORN, MD.  Called Nurse Triage reporting Dysuria.  Symptoms began yesterday.  Interventions attempted: Rest, hydration, or home remedies.  Symptoms are: unchanged.  Triage Disposition: See Physician Within 24 Hours  Patient/caregiver understands and will follow disposition?: Yes          Copied from CRM #[id number]. Topic: Clinical - Red Word Triage >> Jun 13, 2024  8:56 AM Suzen RAMAN wrote: Red Word that prompted transfer to Nurse Triage: burning with urination; would like to know if a medication can be called in for this. Reason for Disposition  Age > 50 years  Answer Assessment - Initial Assessment Questions 1. SEVERITY: How bad is the pain?  (e.g., Scale 1-10; mild, moderate, or severe)     *No Answer* 2. FREQUENCY: How many times have you had painful urination today?      X2 since this AM 3. PATTERN: Is pain present every time you urinate or just sometimes?      Every time 4. ONSET: When did the painful urination start?      Last night 5. FEVER: Do you have a fever? If Yes, ask: What is your temperature, how was it measured, and when did it start?     denies 6. PAST UTI: Have you had a urine infection before? If Yes, ask: When was the last time? and What happened that time?      Yes, way back and sx are similar 7. CAUSE: What do you think is causing the painful urination?  (e.g., UTI, scratch, Herpes sore)     unknown 8. OTHER SYMPTOMS: Do you have any other symptoms? (e.g., blood in urine, flank pain, genital sores, urgency, vaginal discharge)     Reports R lower abd pain that she has seen PCP for and was put on cephalexin  9. PREGNANCY: Is there any chance you are pregnant? When was your last menstrual period?     N/a  Protocols used: Urination Pain - Female-A-AH

## 2024-06-13 NOTE — Progress Notes (Signed)
 "  Acute Office Visit  Subjective:     Patient ID: Monique [name], female    DOB: 1936/04/20, 88 y.o.   MRN: [id number]  Chief Complaint  Patient presents with   Acute Visit    HPI  Discussed the use of AI scribe software for clinical note transcription with the patient, who gave verbal consent to proceed.  History of Present Illness Monique [name] is an 88 year old female with kidney stones who presents with right-sided abdominal pain.  Right-sided abdominal pain - Persistent right-sided abdominal pain - Partial relief with Tylenol  - No associated hematuria, gastrointestinal upset, fever, or chills - Occasional mild back pain without significant discomfort - Increased water intake today  History of nephrolithiasis - Previous episodes of kidney stones - History of urinary crystals  Falls and balance disturbance - Three falls recently, with the most recent fall last month - Falls characterized by sudden loss of balance - No associated dizziness or leg weakness  Osteoporosis - History of osteoporosis     ROS Per HPI      Objective:    Ht 5' 5 (1.651 m)   BMI 21.43 kg/m    Physical Exam Vitals and nursing note reviewed.  Constitutional:      General: She is not in acute distress.    Appearance: She is normal weight.     Comments: Frail elderly  HENT:     Head: Normocephalic and atraumatic.     Right Ear: External ear normal.     Left Ear: External ear normal.     Nose: Nose normal.     Mouth/Throat:     Mouth: Mucous membranes are moist.     Pharynx: Oropharynx is clear.  Eyes:     Extraocular Movements: Extraocular movements intact.     Pupils: Pupils are equal, round, and reactive to light.  Cardiovascular:     Rate and Rhythm: Normal rate and regular rhythm.     Pulses: Normal pulses.     Heart sounds: Normal heart sounds.  Pulmonary:     Effort: Pulmonary effort is normal. No respiratory distress.     Breath sounds: Normal breath sounds.  No wheezing, rhonchi or rales.  Abdominal:     General: There is no distension.     Palpations: There is no mass.     Tenderness: There is no abdominal tenderness. There is no guarding or rebound.     Hernia: No hernia is present.  Musculoskeletal:        General: Normal range of motion.     Cervical back: Normal range of motion.     Right lower leg: No edema.     Left lower leg: No edema.  Lymphadenopathy:     Cervical: No cervical adenopathy.  Neurological:     General: No focal deficit present.     Mental Status: She is alert and oriented to person, place, and time.  Psychiatric:        Mood and Affect: Mood normal.        Thought Content: Thought content normal.     Results for orders placed or performed in visit on 06/13/24  POCT URINALYSIS DIP (CLINITEK)  Result Value Ref Range   Color, UA yellow yellow   Clarity, UA clear clear   Glucose, UA negative negative mg/dL   Bilirubin, UA negative negative   Ketones, POC UA negative negative mg/dL   Spec Grav, UA <=8.994 (A) 1.010 - 1.025   Blood,  UA small (A) negative   pH, UA 6.0 5.0 - 8.0   POC PROTEIN,UA negative negative, trace   Urobilinogen, UA 0.2 0.2 or 1.0 E.U./dL   Nitrite, UA Negative Negative   Leukocytes, UA Negative Negative        Assessment & Plan:   Assessment and Plan Assessment & Plan Dysuria, history of renal calculi, RLQ abdominal pain Suspected kidney stone due to right-sided pain, dysuria, and crystals in urine. Negative urine culture for UTI. - Encourage increased fluid intake. - Order urinalysis for blood and crystals. - Consider imaging if urinalysis shows blood or crystals.  Osteoporosis, Recurrent falls with walking, balance problem Osteoporosis with recent falls and balance issues. No dizziness reported. - Refer to physical therapy for balance training and fall prevention.     Orders Placed This Encounter  Procedures   Urine Culture    Standing Status:   Future    Number of  Occurrences:   1    Expected Date:   06/13/2024    Expiration Date:   06/13/2025   CT RENAL STONE STUDY    Standing Status:   Future    Expected Date:   06/13/2024    Expiration Date:   08/13/2024    Preferred imaging location?:   [facility]   CBC w/Diff   Comp Met (CMET)   Ambulatory referral to Physical Therapy    Referral Priority:   Routine    Referral Type:   Physical Medicine    Referral Reason:   Specialty Services Required    Requested Specialty:   Physical Therapy    Number of Visits Requested:   1   POCT URINALYSIS DIP (CLINITEK)     No orders of the defined types were placed in this encounter.   Return if symptoms worsen or fail to improve.  Corean LITTIE Ku, FNP  "

## 2024-06-14 ENCOUNTER — Encounter: Payer: Self-pay | Admitting: Family Medicine

## 2024-06-14 LAB — URINE CULTURE: Result:: NO GROWTH

## 2024-06-14 NOTE — Patient Instructions (Signed)
 We have placed a referral for you to physical therapy today.  Someone will be reaching out to get you scheduled.  Your urine looks good in the office today. We have sent it to the lab for culture and confirmation.  I have ordered an abdominal CT for you today to help further evaluate your pain. Someone will be calling to get you scheduled for this.   We will be in contact with results once they are received.  Follow-up with me for new or worsening symptoms.

## 2024-06-15 ENCOUNTER — Ambulatory Visit: Payer: Self-pay | Admitting: Family Medicine

## 2024-06-17 ENCOUNTER — Telehealth: Payer: Self-pay

## 2024-06-17 NOTE — Telephone Encounter (Signed)
 Copied from CRM [id number]. Topic: Clinical - Lab/Test Results >> Jun 17, 2024  9:07 AM Berwyn MATSU wrote: Reason for CRM:  Patient called back regarding her lab results.   I called [facility] spoke with Rocky who advised that she would let nurse know to call patient back.

## 2024-06-17 NOTE — Telephone Encounter (Signed)
 Spoke with patient, gave a verbal understanding. She will call [facility] to get CT scheduled

## 2024-06-18 ENCOUNTER — Ambulatory Visit
Admission: RE | Admit: 2024-06-18 | Discharge: 2024-06-18 | Disposition: A | Discharge status: Home / Self Care | Source: Ambulatory Visit | Attending: Family Medicine | Admitting: Family Medicine

## 2024-06-18 DIAGNOSIS — N200 Calculus of kidney: Secondary | ICD-10-CM | POA: Diagnosis not present

## 2024-06-18 DIAGNOSIS — R300 Dysuria: Secondary | ICD-10-CM

## 2024-06-18 DIAGNOSIS — R1031 Right lower quadrant pain: Secondary | ICD-10-CM

## 2024-06-18 DIAGNOSIS — Z87442 Personal history of urinary calculi: Secondary | ICD-10-CM

## 2024-07-03 ENCOUNTER — Ambulatory Visit: Admitting: Physical Therapy

## 2024-07-03 DIAGNOSIS — M6281 Muscle weakness (generalized): Secondary | ICD-10-CM | POA: Diagnosis not present

## 2024-07-03 DIAGNOSIS — R2689 Other abnormalities of gait and mobility: Secondary | ICD-10-CM | POA: Diagnosis not present

## 2024-07-03 NOTE — Therapy (Unsigned)
 OUTPATIENT PHYSICAL THERAPY LOWER EXTREMITY EVALUATION   Patient Name: Isabellamarie Randa MRN: [id number] DOB:1936/08/05, 88 y.o., female Today's Date: 07/04/2024  END OF SESSION:  PT End of Session - 07/05/24 1356     Visit Number 1    Number of Visits 16    Date for Recertification  08/29/24    Authorization Type U[employer]- submitted    PT Start Time 0846    PT Stop Time 0928    PT Time Calculation (min) 42 min    Equipment Utilized During Treatment Gait belt    Activity Tolerance Patient tolerated treatment well    Behavior During Therapy [facility] for tasks assessed/performed          Past Medical History:  Diagnosis Date   Abdominal pain 11/18/2015   Acute respiratory failure with hypoxia (HCC) 06/27/2014   Breast pain, left 10/11/2016   Cerebral aneurysm 2000 06/27/2014   Chest pain 10/30/2014   CHF (congestive heart failure) (HCC)    Chronic anticoagulation 06/2014   Coumadin  started after bilateral PE   Chronic kidney disease, stage III (moderate) (HCC) 11/21/2014   Clotting disorder    right was removed, still have left cataract   Diabetes (HCC)    Dizziness and giddiness 11/28/2014   DVT of lower extremity (deep venous thrombosis) (HCC) 11/28/2014   Dysuria 07/07/2014   Elevated troponin 11/28/2014   Fatty liver    Gastritis 05/16/2012   Chronic dyspepsia on PPI daily.    Headache, acute 06/08/2010   Qualifier: Diagnosis of  By: Harlow MD, Ozell BRAVO    Heart murmur    Hematochezia 11/18/2015   History of fibromuscular dysplasia 06/27/2014   HTN (hypertension)    Essential   Hyperlipidemia    Hypertrophic obstructive cardiomyopathy (HOCM) (HCC) 05/20/2008   CMR 4/22: EF 72, small amount of patchy LGE in basal septum c/w HCM, asymmetric basal septal hypertrophy (15 mm) c/w HCM, no ischemia, ascending aorta 38 mm   Kidney stones    LVH (left ventricular hypertrophy)    Orthostatic hypotension 11/28/2014   PAF (paroxysmal atrial fibrillation) (HCC) 2000   Palpitations  11/28/2014   PAROXYSMAL ATRIAL FIBRILLATION 05/20/2008   Qualifier: Diagnosis of  By: Harlow MD, Ozell BRAVO    PE (pulmonary embolism) 06/2014   Bilateral   Postmenopausal osteoporosis 04/2019   T score -3.3   Pulmonary embolism, bilateral (HCC) 06/27/2014   SOB (shortness of breath) 06/27/2014   Subarachnoid hemorrhage (HCC) 2000   UTI (urinary tract infection) 01/04/2015   Past Surgical History:  Procedure Laterality Date   ABDOMINAL HYSTERECTOMY  2000   Brain hemorrage     2000   CARDIAC CATHETERIZATION  05/2008   Nl cors, no gradient, EF 60%   Patient Active Problem List   Diagnosis Date Noted   RLQ abdominal pain 06/13/2024   Recurrent falls while walking 06/13/2024   Balance problem 06/13/2024   Renal stone 06/13/2024   Leg swelling 02/29/2024   Chronic diastolic (congestive) heart failure (HCC) 06/06/2023   Routine general medical examination at a health care facility 05/06/2022   Chronic left shoulder pain 11/04/2021   Osteoporosis 03/19/2021   Muscle spasm 01/02/2020   Thoracic aortic aneurysm without rupture 12/11/2019   PSVT (paroxysmal supraventricular tachycardia) 08/22/2019   Atypical chest pain 08/21/2019   Blood in stool 01/04/2018   Diabetes mellitus type 2 with complications (HCC) 09/21/2017   Allergic rhinitis 06/20/2017   Headache 10/23/2015   Frequent urination 05/06/2015   Chronic RLQ pain 04/24/2015  Chronic kidney disease, stage III (moderate) (HCC) 11/21/2014   Dysuria 07/07/2014   Pulmonary embolism, bilateral (HCC) 06/27/2014   Cerebral aneurysm 2000 06/27/2014   Hyperlipidemia associated with type 2 diabetes mellitus (HCC) 05/20/2008   Essential hypertension 05/20/2008   Hypertrophic obstructive cardiomyopathy (HOCM) (HCC) 05/20/2008   PAROXYSMAL ATRIAL FIBRILLATION 05/20/2008   Subarachnoid hemorrhage 2000 05/20/2008     PCP: Almarie Cleveland  REFERRING PROVIDER: Corean Ku , FNP  REFERRING DIAG: Recurrent falls, Osteoporosis    THERAPY DIAG:  Other abnormalities of gait and mobility  Muscle weakness (generalized)  Rationale for Evaluation and Treatment: Rehabilitation  ONSET DATE:    SUBJECTIVE:   SUBJECTIVE STATEMENT: Has had 3 falls, at home in and outside.  Notes OA in L knee. Notes achiness in both legs, knees and lower legs.  Tightness around L breast/chest/- not from fall. Tightness in chest- last week. -- used nitroglycerin  and improved.  Some low back pain also.  Denies any dizziness/lightheaded.  Lives alone- has stairs, but does not have to go up.  2 steps to get in, no railing  Does not drive- son drives her  Shoes: wearing black dress shoes/ slight heel.  Wears tennis shoes at home.  She does some exercise seated in her chair.  Was doing more standing exercises but not doing now because of falls. Also has been walking less due to falls.  AD: not using today (thinks she has a cane at home)    PERTINENT HISTORY: DM, Osteoporosis, Heart failure, CKD, PSVT, NO pacemaker   PAIN:  Are you having pain? Yes: NPRS scale: up to 8 /10 Pain location: L knee and lower legs  Pain description: achey  Aggravating factors: increased activity  Relieving factors: none stated   PRECAUTIONS: Fall  WEIGHT BEARING RESTRICTIONS: No  FALLS:  Has patient fallen in last 6 months? Yes. Number of falls 3 at home, in house and outside of house   PLOF: Independent   PATIENT GOALS:  Decreased falls,   NEXT MD VISIT:   OBJECTIVE:   DIAGNOSTIC FINDINGS:   PATIENT SURVEYS:    COGNITION: Overall cognitive status: Within functional limits for tasks assessed     SENSATION: WFL  EDEMA:  Edema in bil feet/lower legs  L>R    POSTURE:    No Significant postural limitations  PALPATION:   LOWER EXTREMITY ROM: Hips: WFL Knees: mild limitation on L Ankles: mod limitation for DF bil; , weakness on L >R for DF   LOWER EXTREMITY MMT:  MMT Left eval Right  eval  Hip flexion    Hip extension     Hip abduction    Hip adduction    Hip internal rotation    Hip external rotation    Knee flexion 4 4  Knee extension 4 4  Ankle dorsiflexion 3 4-  Ankle plantarflexion    Ankle inversion    Ankle eversion     (Blank rows = not tested)  LOWER EXTREMITY SPECIAL TESTS:    FUNCTIONAL TESTS:   TUG:  15.60,  no AD, using hands to rise from chair.  Sit to stand: unable to do without use of hands on chair    [facility] PT Assessment - 07/05/24 0001       Standardized Balance Assessment   Standardized Balance Assessment Berg Balance Test;Timed Up and Go Test      Berg Balance Test   Sit to Stand Able to stand  independently using hands    Standing Unsupported Able  to stand safely 2 minutes    Sitting with Back Unsupported but Feet Supported on Floor or Stool Able to sit safely and securely 2 minutes    Stand to Sit Sits safely with minimal use of hands    Transfers Able to transfer safely, definite need of hands    Standing Unsupported with Eyes Closed Able to stand 10 seconds with supervision    Standing Unsupported with Feet Together Able to place feet together independently and stand for 1 minute with supervision    From Standing, Reach Forward with Outstretched Arm Can reach forward >5 cm safely (2)    From Standing Position, Pick up Object from Floor Able to pick up shoe, needs supervision    From Standing Position, Turn to Look Behind Over each Shoulder Looks behind one side only/other side shows less weight shift    Turn 360 Degrees Able to turn 360 degrees safely but slowly    Standing Unsupported, Alternately Place Feet on Step/Stool Able to complete >2 steps/needs minimal assist    Standing Unsupported, One Foot in Front Needs help to step but can hold 15 seconds    Standing on One Leg Tries to lift leg/unable to hold 3 seconds but remains standing independently    Total Score 37              GAIT: Distance walked: 150 ft  Assistive device utilized: None Level of  assistance: Complete Independence Comments: slow gait, cautious, L knee in flexion.    TODAY'S TREATMENT:                                                                                                                              DATE:  Eval:  Ther ex: seated ankle pumps 2 x 10-education for HEP   PATIENT EDUCATION:  Education details: PT POC, Exam findings, HEP Person educated: Patient Education method: Explanation, Demonstration, Tactile cues, Verbal cues, and Handouts Education comprehension: verbalized understanding, returned demonstration, verbal cues required, tactile cues required, and needs further education   HOME EXERCISE PROGRAM: Access Code: [email or web address] URL: [email or web address] Date: 07/03/2024 Prepared by: Tinnie Don  Exercises - Seated Ankle Pumps on Table  - 2 x daily - 1-2 sets - 10 reps - Seated Ankle Pumps  - 2 x daily - 1 sets - 10 reps  ASSESSMENT:  CLINICAL IMPRESSION: Patient presents with primary complaint of  decreasing balance and increased falls. She has decreased sores on balance testing today. She has lack of effective HEP for decreased mobility. She will benefit from improving mobility so she can get back to doing her previous exercise and walking without fear of falling. She does have OA in knee that is effecting LE stability some.  Pt with decreased ability for full functional activities. Pt will  benefit from skilled PT to improve deficits and pain and to return to PLOF.   OBJECTIVE IMPAIRMENTS: Abnormal gait, decreased activity tolerance, decreased balance, decreased mobility, decreased  ROM, decreased strength, and pain.   ACTIVITY LIMITATIONS: bending, standing, squatting, stairs, transfers, and locomotion level  PARTICIPATION LIMITATIONS: meal prep, cleaning, shopping, and community activity  PERSONAL FACTORS: Age, Fitness, Past/current experiences, and Time since onset of injury/illness/exacerbation are also affecting  patient's functional outcome.   REHAB POTENTIAL: Good  CLINICAL DECISION MAKING: Evolving/moderate complexity  EVALUATION COMPLEXITY: Moderate   GOALS: Goals reviewed with patient? Yes  SHORT TERM GOALS: Target date: 07/24/2024   Pt to be independent with initial HEP  Goal status: INITIAL  2.  Pt to demo ability to rise/sit to stand from higher mat table with minimal use of hands.   Goal status: INITIAL     LONG TERM GOALS: Target date: 08/28/2024   Pt to be independent with final HEP  Goal status: INITIAL  2.  Pt to demo ability to rise from chair with minimal use of UEs, consistently   Goal status: INITIAL  3.  Pt to demo improved score on BERG by at least 4 points.   Goal status: INITIAL  4.  Pt to demo dynamic walking, and stairs with balance WFL for pt age.   Goal status: INITIAL    PLAN:  PT FREQUENCY: 1-2x/week  PT DURATION: 8 weeks  PLANNED INTERVENTIONS: Therapeutic exercises, Therapeutic activity, Neuromuscular re-education, Patient/Family education, Self Care, Joint mobilization, Joint manipulation, Stair training, Orthotic/Fit training, DME instructions, Aquatic Therapy, Dry Needling, Electrical stimulation, Cryotherapy, Moist heat, Taping, Ultrasound, Ionotophoresis 4mg /ml Dexamethasone, Manual therapy,  Vasopneumatic device, Traction, Spinal manipulation, Spinal mobilization,Balance training, Gait training,   PLAN FOR NEXT SESSION:    Tinnie Don, PT, DPT 2:03 PM  07/05/24

## 2024-07-04 ENCOUNTER — Other Ambulatory Visit: Payer: Self-pay | Admitting: Internal Medicine

## 2024-07-05 ENCOUNTER — Encounter: Payer: Self-pay | Admitting: Physical Therapy

## 2024-07-18 ENCOUNTER — Encounter: Payer: Self-pay | Admitting: Physical Therapy

## 2024-07-18 ENCOUNTER — Ambulatory Visit: Admitting: Physical Therapy

## 2024-07-18 DIAGNOSIS — R2689 Other abnormalities of gait and mobility: Secondary | ICD-10-CM | POA: Diagnosis not present

## 2024-07-18 DIAGNOSIS — M6281 Muscle weakness (generalized): Secondary | ICD-10-CM | POA: Diagnosis not present

## 2024-07-18 NOTE — Therapy (Signed)
 OUTPATIENT PHYSICAL THERAPY LOWER EXTREMITY TREATMENT   Patient Name: Toya Palacios MRN: [id number] DOB:[birth year], 88 y.o., female Today's Date: 07/18/24   END OF SESSION:  PT End of Session - 07/18/24 1144     Visit Number 2    Number of Visits 16    Date for Recertification  08/29/24    Authorization Type U[employer]- submitted    PT Start Time 1107    PT Stop Time 1146    PT Time Calculation (min) 39 min    Equipment Utilized During Treatment Gait belt    Activity Tolerance Patient tolerated treatment well    Behavior During Therapy WFL for tasks assessed/performed           Past Medical History:  Diagnosis Date   Abdominal pain 11/18/2015   Acute respiratory failure with hypoxia (HCC) 06/27/2014   Breast pain, left 10/11/2016   Cerebral aneurysm 2000 06/27/2014   Chest pain 10/30/2014   CHF (congestive heart failure) (HCC)    Chronic anticoagulation 06/2014   Coumadin  started after bilateral PE   Chronic kidney disease, stage III (moderate) (HCC) 11/21/2014   Clotting disorder    right was removed, still have left cataract   Diabetes (HCC)    Dizziness and giddiness 11/28/2014   DVT of lower extremity (deep venous thrombosis) (HCC) 11/28/2014   Dysuria 07/07/2014   Elevated troponin 11/28/2014   Fatty liver    Gastritis 05/16/2012   Chronic dyspepsia on PPI daily.    Headache, acute 06/08/2010   Qualifier: Diagnosis of  By: Harlow MD, Ozell BRAVO    Heart murmur    Hematochezia 11/18/2015   History of fibromuscular dysplasia 06/27/2014   HTN (hypertension)    Essential   Hyperlipidemia    Hypertrophic obstructive cardiomyopathy (HOCM) (HCC) 05/20/2008   CMR 4/22: EF 72, small amount of patchy LGE in basal septum c/w HCM, asymmetric basal septal hypertrophy (15 mm) c/w HCM, no ischemia, ascending aorta 38 mm   Kidney stones    LVH (left ventricular hypertrophy)    Orthostatic hypotension 11/28/2014   PAF (paroxysmal atrial fibrillation) (HCC) 2000   Palpitations  11/28/2014   PAROXYSMAL ATRIAL FIBRILLATION 05/20/2008   Qualifier: Diagnosis of  By: Harlow MD, Ozell BRAVO    PE (pulmonary embolism) 06/2014   Bilateral   Postmenopausal osteoporosis 04/2019   T score -3.3   Pulmonary embolism, bilateral (HCC) 06/27/2014   SOB (shortness of breath) 06/27/2014   Subarachnoid hemorrhage (HCC) 2000   UTI (urinary tract infection) 01/04/2015   Past Surgical History:  Procedure Laterality Date   ABDOMINAL HYSTERECTOMY  2000   Brain hemorrage     2000   CARDIAC CATHETERIZATION  05/2008   Nl cors, no gradient, EF 60%   Patient Active Problem List   Diagnosis Date Noted   RLQ abdominal pain 06/13/2024   Recurrent falls while walking 06/13/2024   Balance problem 06/13/2024   Renal stone 06/13/2024   Leg swelling 02/29/2024   Chronic diastolic (congestive) heart failure (HCC) 06/06/2023   Routine general medical examination at a health care facility 05/06/2022   Chronic left shoulder pain 11/04/2021   Osteoporosis 03/19/2021   Muscle spasm 01/02/2020   Thoracic aortic aneurysm without rupture 12/11/2019   PSVT (paroxysmal supraventricular tachycardia) 08/22/2019   Atypical chest pain 08/21/2019   Blood in stool 01/04/2018   Diabetes mellitus type 2 with complications (HCC) 09/21/2017   Allergic rhinitis 06/20/2017   Headache 10/23/2015   Frequent urination 05/06/2015   Chronic RLQ  pain 04/24/2015   Chronic kidney disease, stage III (moderate) (HCC) 11/21/2014   Dysuria 07/07/2014   Pulmonary embolism, bilateral (HCC) 06/27/2014   Cerebral aneurysm 2000 06/27/2014   Hyperlipidemia associated with type 2 diabetes mellitus (HCC) 05/20/2008   Essential hypertension 05/20/2008   Hypertrophic obstructive cardiomyopathy (HOCM) (HCC) 05/20/2008   PAROXYSMAL ATRIAL FIBRILLATION 05/20/2008   Subarachnoid hemorrhage 2000 05/20/2008     PCP: Almarie Cleveland  REFERRING PROVIDER: Corean Ku , FNP  REFERRING DIAG: Recurrent falls, Osteoporosis    THERAPY DIAG:  Other abnormalities of gait and mobility  Muscle weakness (generalized)  Rationale for Evaluation and Treatment: Rehabilitation  ONSET DATE:    SUBJECTIVE:   SUBJECTIVE STATEMENT: Pt feeling good today.  Does still feel pain soreness in lateral rib on L (present for a couple weeks, and after fall) . She will ask MD about this at next visit. Recommended to make appt sooner if she does not think it is improving or if worsening.     Has had 3 falls, at home in and outside.  Notes OA in L knee. Notes achiness in both legs, knees and lower legs.  Tightness around L breast/chest/- not from fall. Tightness in chest- last week. -- used nitroglycerin  and improved.  Some low back pain also.  Denies any dizziness/lightheaded.  Lives alone- has stairs, but does not have to go up.  2 steps to get in, no railing  Does not drive- son drives her  Shoes: wearing black dress shoes/ slight heel.  Wears tennis shoes at home.  She does some exercise seated in her chair.  Was doing more standing exercises but not doing now because of falls. Also has been walking less due to falls.  AD: not using today (thinks she has a cane at home)    PERTINENT HISTORY: DM, Osteoporosis, Heart failure, CKD, PSVT, NO pacemaker   PAIN:  Are you having pain? Yes: NPRS scale: up to 8 /10 Pain location: L knee and lower legs  Pain description: achey  Aggravating factors: increased activity  Relieving factors: none stated   PRECAUTIONS: Fall  WEIGHT BEARING RESTRICTIONS: No  FALLS:  Has patient fallen in last 6 months? Yes. Number of falls 3 at home, in house and outside of house   PLOF: Independent   PATIENT GOALS:  Decreased falls,   NEXT MD VISIT:   OBJECTIVE:   DIAGNOSTIC FINDINGS:   PATIENT SURVEYS:    COGNITION: Overall cognitive status: Within functional limits for tasks assessed     SENSATION: WFL  EDEMA:  Edema in bil feet/lower legs  L>R    POSTURE:    No  Significant postural limitations  PALPATION: L thoracic: mild pain to palpate, no pain in ribs, no pain with L side bending slight pain with R side bending.   LOWER EXTREMITY ROM: Hips: WFL Knees: mild limitation on L Ankles: mod limitation for DF bil; , weakness on L >R for DF   LOWER EXTREMITY MMT:  MMT Left eval Right  eval  Hip flexion    Hip extension    Hip abduction    Hip adduction    Hip internal rotation    Hip external rotation    Knee flexion 4 4  Knee extension 4 4  Ankle dorsiflexion 3 4-  Ankle plantarflexion    Ankle inversion    Ankle eversion     (Blank rows = not tested)  LOWER EXTREMITY SPECIAL TESTS:    FUNCTIONAL TESTS:   TUG:  15.60,  no AD, using hands to rise from chair.  Sit to stand: unable to do without use of hands on chair     GAIT: Distance walked: 150 ft  Assistive device utilized: None Level of assistance: Complete Independence Comments: slow gait, cautious, L knee in flexion.    TODAY'S TREATMENT:                                                                                                                              DATE:   07/18/24:  Therapeutic Exercise: Aerobic: Supine:  Seated:  ankle pumps  x 15 bil;  LAQ 2 x 10 bil;  Standing: Stretches:   hamstring stretch 20 sec x 3 bil;  Neuromuscular Re-education: Manual Therapy: Therapeutic Activity: Sit to stand from higher mat table- hands on legs 2 x 5,  From regular chair height- 2 light Ue support- education for optimal mechanics.  Toe taps on 6 in step x 20 Step ups 4 in step ( 2 Ue light hand held support)  x 10 bil;  Self Care:   Eval:   Ther ex: seated ankle pumps 2 x 10-education for HEP   PATIENT EDUCATION:  Education details: updated and reviewed HEP.  Person educated: Patient Education method: Explanation, Demonstration, Tactile cues, Verbal cues, and Handouts Education comprehension: verbalized understanding, returned demonstration, verbal cues  required, tactile cues required, and needs further education   HOME EXERCISE PROGRAM: Access Code: [email or web address] URL: [email or web address] Date: 07/03/2024 Prepared by: Tinnie Don  Exercises - Seated Ankle Pumps on Table  - 2 x daily - 1-2 sets - 10 reps - Seated Ankle Pumps  - 2 x daily - 1 sets - 10 reps  ASSESSMENT:  CLINICAL IMPRESSION: Pt with good tolerance for exercises today. Educated on initial Hep for LE mobility and strength. Improved ability and mechanics for sit to stand after practice today. Plan to progress balance next visit .  Eval: Patient presents with primary complaint of  decreasing balance and increased falls. She has decreased sores on balance testing today. She has lack of effective HEP for decreased mobility. She will benefit from improving mobility so she can get back to doing her previous exercise and walking without fear of falling. She does have OA in knee that is effecting LE stability some.  Pt with decreased ability for full functional activities. Pt will  benefit from skilled PT to improve deficits and pain and to return to PLOF.   OBJECTIVE IMPAIRMENTS: Abnormal gait, decreased activity tolerance, decreased balance, decreased mobility, decreased ROM, decreased strength, and pain.   ACTIVITY LIMITATIONS: bending, standing, squatting, stairs, transfers, and locomotion level  PARTICIPATION LIMITATIONS: meal prep, cleaning, shopping, and community activity  PERSONAL FACTORS: Age, Fitness, Past/current experiences, and Time since onset of injury/illness/exacerbation are also affecting patient's functional outcome.   REHAB POTENTIAL: Good  CLINICAL DECISION MAKING: Evolving/moderate complexity  EVALUATION COMPLEXITY: Moderate   GOALS: Goals reviewed with patient? Yes  SHORT TERM  GOALS: Target date: 07/24/2024   Pt to be independent with initial HEP  Goal status: INITIAL  2.  Pt to demo ability to rise/sit to stand from higher mat  table with minimal use of hands.   Goal status: INITIAL     LONG TERM GOALS: Target date: 08/28/2024   Pt to be independent with final HEP  Goal status: INITIAL  2.  Pt to demo ability to rise from chair with minimal use of UEs, consistently   Goal status: INITIAL  3.  Pt to demo improved score on BERG by at least 4 points.   Goal status: INITIAL  4.  Pt to demo dynamic walking, and stairs with balance WFL for pt age.   Goal status: INITIAL    PLAN:  PT FREQUENCY: 1-2x/week  PT DURATION: 8 weeks  PLANNED INTERVENTIONS: Therapeutic exercises, Therapeutic activity, Neuromuscular re-education, Patient/Family education, Self Care, Joint mobilization, Joint manipulation, Stair training, Orthotic/Fit training, DME instructions, Aquatic Therapy, Dry Needling, Electrical stimulation, Cryotherapy, Moist heat, Taping, Ultrasound, Ionotophoresis 4mg /ml Dexamethasone, Manual therapy,  Vasopneumatic device, Traction, Spinal manipulation, Spinal mobilization,Balance training, Gait training,   PLAN FOR NEXT SESSION:    Tinnie Don, PT, DPT 12:12 PM  07/18/24

## 2024-07-19 ENCOUNTER — Ambulatory Visit: Payer: Self-pay

## 2024-07-19 NOTE — Telephone Encounter (Signed)
 FYI Only or Action Required?: FYI only for provider: appointment scheduled on 07/22/24 already scheduled.  Patient was last seen in primary care on 06/13/2024 by Alvia Corean CROME, FNP.  Called Nurse Triage reporting Tingling.  Symptoms began about a month ago.  Interventions attempted: Nothing.  Symptoms are: unchanged.  Triage Disposition: See PCP When Office is Open (Within 3 Days)  Patient/caregiver understands and will follow disposition?: Yes   Reason for Disposition  [1] Tingling in feet AND [2] new or increased  Answer Assessment - Initial Assessment Questions No available appts today. Patient already has appt scheduled 07/22/24 with pcp.  Advised ED if symptoms worsen.  Encourage drinking water.  1. BLOOD GLUCOSE: What is your blood glucose level?      Does not check blood sugar 2. ONSET: When did you check the blood glucose?     Burning sensation under feet intermittently, more at night, reports swelling MD already aware, last month; denies numbness/ weakness, discolored No problems with standing or walking 3. USUAL RANGE: What is your glucose level usually? (e.g., usual fasting morning value, usual evening value)     unsure 4. KETONES: Do you check for ketones (urine or blood test strips)? If Yes, ask: What does the test show now?      no 5. TYPE 1 or 2:  Do you know what type of diabetes you have?  (e.g., Type 1, Type 2, Gestational; doesn't know)      Type II; unsure 6. INSULIN : Do you take insulin ? What type of insulin (s) do you use? What is the mode of delivery? (syringe, pen; injection or pump)?      no 7. DIABETES PILLS: Do you take any pills for your diabetes? If Yes, ask: Have you missed taking any pills recently?     Metformin  daily 8. OTHER SYMPTOMS: Do you have any symptoms? (e.g., fever, frequent urination, difficulty breathing, dizziness, weakness, vomiting) no     Denies blurred vision, dizziness, fever, chills, n/v/d, pain  urination, weakness(legs chronic), diff breathing, foul smelling urine, no increase thirst  Frequent urination; years(small bladder, no new symptoms)  Answer Assessment - Initial Assessment Questions No available appts today. Patient already has appt scheduled 07/22/24 with pcp.  Advised ED if symptoms worsen.  Encourage drinking water.  1. BLOOD GLUCOSE: What is your blood glucose level?      Does not check blood sugar 2. ONSET: When did you check the blood glucose?     Burning sensation under feet intermittently, more at night, reports swelling MD already aware, last month; denies numbness/ weakness, discolored No problems with standing or walking 3. USUAL RANGE: What is your glucose level usually? (e.g., usual fasting morning value, usual evening value)     unsure 4. KETONES: Do you check for ketones (urine or blood test strips)? If Yes, ask: What does the test show now?      no 5. TYPE 1 or 2:  Do you know what type of diabetes you have?  (e.g., Type 1, Type 2, Gestational; doesn't know)      Type II; unsure 6. INSULIN : Do you take insulin ? What type of insulin (s) do you use? What is the mode of delivery? (syringe, pen; injection or pump)?      no 7. DIABETES PILLS: Do you take any pills for your diabetes? If Yes, ask: Have you missed taking any pills recently?     Metformin  daily 8. OTHER SYMPTOMS: Do you have any symptoms? (e.g., fever, frequent urination, difficulty breathing,  dizziness, weakness, vomiting) no     Denies blurred vision, dizziness, fever, chills, n/v/d, pain urination, weakness(legs chronic), diff breathing, foul smelling urine, no increase thirst  Frequent urination; years(small bladder, no new symptoms)  Protocols used: Diabetes - High Blood Sugar-A-AH, Foot Pain-A-AH

## 2024-07-22 ENCOUNTER — Ambulatory Visit (INDEPENDENT_AMBULATORY_CARE_PROVIDER_SITE_OTHER): Admitting: Internal Medicine

## 2024-07-22 ENCOUNTER — Encounter: Payer: Self-pay | Admitting: Internal Medicine

## 2024-07-22 VITALS — BP 120/80 | HR 62 | Temp 97.8°F | Ht 65.0 in | Wt 129.4 lb

## 2024-07-22 DIAGNOSIS — R0789 Other chest pain: Secondary | ICD-10-CM | POA: Diagnosis not present

## 2024-07-22 DIAGNOSIS — R208 Other disturbances of skin sensation: Secondary | ICD-10-CM | POA: Diagnosis not present

## 2024-07-22 DIAGNOSIS — E118 Type 2 diabetes mellitus with unspecified complications: Secondary | ICD-10-CM

## 2024-07-22 LAB — CBC
HCT: 40.3 % (ref 36.0–46.0)
Hemoglobin: 13.3 g/dL (ref 12.0–15.0)
MCHC: 32.9 g/dL (ref 30.0–36.0)
MCV: 98.4 fl (ref 78.0–100.0)
Platelets: 159 K/uL (ref 150.0–400.0)
RBC: 4.1 Mil/uL (ref 3.87–5.11)
RDW: 13 % (ref 11.5–15.5)
WBC: 7.3 K/uL (ref 4.0–10.5)

## 2024-07-22 LAB — VITAMIN B12: Vitamin B-12: 576 pg/mL (ref 211–911)

## 2024-07-22 LAB — VITAMIN D 25 HYDROXY (VIT D DEFICIENCY, FRACTURES): VITD: 44.74 ng/mL (ref 30.00–100.00)

## 2024-07-22 LAB — TSH: TSH: 1.15 u[IU]/mL (ref 0.35–5.50)

## 2024-07-22 MED ORDER — PREDNISONE 20 MG PO TABS: 40.0000 mg | ORAL_TABLET | Freq: Every day | ORAL | 0 refills | Status: AC

## 2024-07-22 NOTE — Progress Notes (Addendum)
 Subjective:   Patient ID: Monique [name], female    DOB: [birth year], 88 y.o.   MRN: [id number]  Discussed the use of AI scribe software for clinical note transcription with the patient, who gave verbal consent to proceed.  History of Present Illness Monique [name] is an 88 year old female who presents with a burning sensation under her feet and aching in her legs.  She has been experiencing a burning sensation under her feet for the past month, occurring intermittently but more frequently than before. The burning is present most days and worsens at night. Tylenol , which she uses for leg aches, has not provided relief for the burning sensation.  She also experiences aching in both legs, especially at night when lying down. Tylenol  provides some relief for the leg aches. Additionally, she has pain in her upper back, described as a 'stabbing pain,' which has been present for the last couple of weeks. She has had similar pain in the past.  Her left foot and leg swell, and she experiences coldness in her hands. She has a history of borderline blood sugar levels, and her sister suggested that her symptoms could be related to diabetes. She has tried using lidocaine  patches for pain relief, which have provided some benefit for other areas but not specifically for her feet.  She has attended physical therapy for exercise. She is concerned about falling due to her symptoms, as she has experienced falls in the past.  Review of Systems  Constitutional: Negative.   HENT: Negative.    Eyes: Negative.   Respiratory:  Negative for cough, chest tightness and shortness of breath.   Cardiovascular:  Positive for chest pain. Negative for palpitations and leg swelling.  Gastrointestinal:  Negative for abdominal distention, abdominal pain, constipation, diarrhea, nausea and vomiting.  Musculoskeletal:  Positive for back pain.  Skin: Negative.   Neurological: Negative.        Burning in feet   Psychiatric/Behavioral: Negative.      Objective:  Physical Exam Constitutional:      Appearance: She is well-developed.  HENT:     Head: Normocephalic and atraumatic.  Cardiovascular:     Rate and Rhythm: Normal rate and regular rhythm.  Pulmonary:     Effort: Pulmonary effort is normal. No respiratory distress.     Breath sounds: Normal breath sounds. No wheezing or rales.     Comments: Left inferior breast with tenderness similar to prior Chest:     Chest wall: Tenderness present.  Abdominal:     General: Bowel sounds are normal. There is no distension.     Palpations: Abdomen is soft.     Tenderness: There is no abdominal tenderness.  Musculoskeletal:     Cervical back: Normal range of motion.  Skin:    General: Skin is warm and dry.  Neurological:     Mental Status: She is alert and oriented to person, place, and time.     Coordination: Coordination normal.     Vitals:   07/22/24 1449  BP: 120/80  Pulse: 62  Temp: 97.8 F (36.6 C)  TempSrc: Oral  SpO2: 97%  Weight: 129 lb 6.4 oz (58.7 kg)  Height: 5' 5 (1.651 m)    Assessment and Plan Assessment & Plan Peripheral neuropathy with burning and aching in feet and legs   She experiences intermittent burning and aching in her feet and legs, worsening at night. Possible causes include diabetes, vitamin deficiencies, thyroid  issues, or idiopathic origins. Physical therapy  has been ineffective. Ordered blood work for thyroid  function, vitamin levels, and blood sugar. Recommend a trial of lidocaine  patches for symptomatic relief. Consider gabapentin  if symptoms persist.  Atypical chest wall pain left upper abdomen/chest wall under breast She has chronic stabbing pain in her upper back and chest wall, which responds to prednisone . Prescribed prednisone  for pain management.

## 2024-07-22 NOTE — Assessment & Plan Note (Signed)
 Checking HgA1c and she does have some new burning in feet which could be related. Adjust as needed.

## 2024-07-22 NOTE — Assessment & Plan Note (Signed)
 She has chronic stabbing pain in her upper back and chest wall, which responds to prednisone . Prescribed prednisone  for pain management.

## 2024-07-22 NOTE — Patient Instructions (Signed)
 We will send in prednisone  to take 2 pills daily for 5 days for the pain below the breast.  We will check the labs for the burning in the feet and have you try lidocaine  patches to see if this helps with the burning.  If the patches do not help let us  know and we can try gabapentin  at night time.

## 2024-07-22 NOTE — Assessment & Plan Note (Signed)
 She experiences intermittent burning and aching in her feet and legs, worsening at night. Possible causes include diabetes, vitamin deficiencies, thyroid  issues, or idiopathic origins. Physical therapy has been ineffective. Ordered blood work for thyroid  function, vitamin levels, and blood sugar. Recommend a trial of lidocaine  patches for symptomatic relief. Consider gabapentin  if symptoms persist.

## 2024-07-23 LAB — COMPREHENSIVE METABOLIC PANEL WITH GFR
ALT: 12 U/L (ref 0–35)
AST: 24 U/L (ref 0–37)
Albumin: 4.1 g/dL (ref 3.5–5.2)
Alkaline Phosphatase: 52 U/L (ref 39–117)
BUN: 33 mg/dL — ABNORMAL HIGH (ref 6–23)
CO2: 25 meq/L (ref 19–32)
Calcium: 9.6 mg/dL (ref 8.4–10.5)
Chloride: 105 meq/L (ref 96–112)
Creatinine, Ser: 1.52 mg/dL — ABNORMAL HIGH (ref 0.40–1.20)
GFR: 30.9 mL/min — ABNORMAL LOW (ref >60.00)
Glucose, Bld: 99 mg/dL (ref 70–99)
Potassium: 4.8 meq/L (ref 3.5–5.1)
Sodium: 142 meq/L (ref 135–145)
Total Bilirubin: 0.4 mg/dL (ref 0.2–1.2)
Total Protein: 6.8 g/dL (ref 6.0–8.3)

## 2024-07-23 LAB — HEMOGLOBIN A1C: Hgb A1c MFr Bld: 6.8 % — ABNORMAL HIGH (ref 4.6–6.5)

## 2024-07-23 NOTE — Telephone Encounter (Signed)
 Pt seen in office on 11/17.

## 2024-07-24 ENCOUNTER — Ambulatory Visit: Payer: Self-pay | Admitting: Internal Medicine

## 2024-07-25 ENCOUNTER — Encounter: Payer: Self-pay | Admitting: Physical Therapy

## 2024-07-25 ENCOUNTER — Ambulatory Visit: Admitting: Physical Therapy

## 2024-07-25 DIAGNOSIS — R2689 Other abnormalities of gait and mobility: Secondary | ICD-10-CM

## 2024-07-25 DIAGNOSIS — M6281 Muscle weakness (generalized): Secondary | ICD-10-CM

## 2024-07-25 NOTE — Therapy (Signed)
 OUTPATIENT PHYSICAL THERAPY LOWER EXTREMITY TREATMENT   Patient Name: Monique [name] MRN: [id number] DOB:09/27/1935, 88 y.o., female Today's Date: 07/25/24   END OF SESSION:  PT End of Session - 07/25/24 1012     Visit Number 3    Number of Visits 16    Date for Recertification  08/29/24    Authorization Type [employer]- submitted    PT Start Time 1015    PT Stop Time 1100    PT Time Calculation (min) 45 min    Equipment Utilized During Treatment Gait belt    Activity Tolerance Patient tolerated treatment well    Behavior During Therapy WFL for tasks assessed/performed           Past Medical History:  Diagnosis Date   Abdominal pain 11/18/2015   Acute respiratory failure with hypoxia (HCC) 06/27/2014   Breast pain, left 10/11/2016   Cerebral aneurysm 2000 06/27/2014   Chest pain 10/30/2014   CHF (congestive heart failure) (HCC)    Chronic anticoagulation 06/2014   Coumadin  started after bilateral PE   Chronic kidney disease, stage III (moderate) (HCC) 11/21/2014   Clotting disorder    right was removed, still have left cataract   Diabetes (HCC)    Dizziness and giddiness 11/28/2014   DVT of lower extremity (deep venous thrombosis) (HCC) 11/28/2014   Dysuria 07/07/2014   Elevated troponin 11/28/2014   Fatty liver    Gastritis 05/16/2012   Chronic dyspepsia on PPI daily.    Headache, acute 06/08/2010   Qualifier: Diagnosis of  By: Harlow MD, Ozell BRAVO    Heart murmur    Hematochezia 11/18/2015   History of fibromuscular dysplasia 06/27/2014   HTN (hypertension)    Essential   Hyperlipidemia    Hypertrophic obstructive cardiomyopathy (HOCM) (HCC) 05/20/2008   CMR 4/22: EF 72, small amount of patchy LGE in basal septum c/w HCM, asymmetric basal septal hypertrophy (15 mm) c/w HCM, no ischemia, ascending aorta 38 mm   Kidney stones    LVH (left ventricular hypertrophy)    Orthostatic hypotension 11/28/2014   PAF (paroxysmal atrial fibrillation) (HCC) 2000   Palpitations  11/28/2014   PAROXYSMAL ATRIAL FIBRILLATION 05/20/2008   Qualifier: Diagnosis of  By: Harlow MD, Ozell BRAVO    PE (pulmonary embolism) 06/2014   Bilateral   Postmenopausal osteoporosis 04/2019   T score -3.3   Pulmonary embolism, bilateral (HCC) 06/27/2014   SOB (shortness of breath) 06/27/2014   Subarachnoid hemorrhage (HCC) 2000   UTI (urinary tract infection) 01/04/2015   Past Surgical History:  Procedure Laterality Date   ABDOMINAL HYSTERECTOMY  2000   Brain hemorrage     2000   CARDIAC CATHETERIZATION  05/2008   Nl cors, no gradient, EF 60%   Patient Active Problem List   Diagnosis Date Noted   Burning sensation of feet 07/22/2024   RLQ abdominal pain 06/13/2024   Recurrent falls while walking 06/13/2024   Balance problem 06/13/2024   Renal stone 06/13/2024   Leg swelling 02/29/2024   Chronic diastolic (congestive) heart failure (HCC) 06/06/2023   Routine general medical examination at a health care facility 05/06/2022   Chronic left shoulder pain 11/04/2021   Osteoporosis 03/19/2021   Muscle spasm 01/02/2020   Thoracic aortic aneurysm without rupture 12/11/2019   PSVT (paroxysmal supraventricular tachycardia) 08/22/2019   Atypical chest pain 08/21/2019   Blood in stool 01/04/2018   Diabetes mellitus type 2 with complications (HCC) 09/21/2017   Allergic rhinitis 06/20/2017   Headache 10/23/2015  Frequent urination 05/06/2015   Chronic RLQ pain 04/24/2015   Chronic kidney disease, stage III (moderate) (HCC) 11/21/2014   Dysuria 07/07/2014   Pulmonary embolism, bilateral (HCC) 06/27/2014   Cerebral aneurysm 2000 06/27/2014   Hyperlipidemia associated with type 2 diabetes mellitus (HCC) 05/20/2008   Essential hypertension 05/20/2008   Hypertrophic obstructive cardiomyopathy (HOCM) (HCC) 05/20/2008   PAROXYSMAL ATRIAL FIBRILLATION 05/20/2008   Subarachnoid hemorrhage 2000 05/20/2008     PCP: Almarie Cleveland  REFERRING PROVIDER: Corean Ku ,  FNP  REFERRING DIAG: Recurrent falls, Osteoporosis   THERAPY DIAG:  Other abnormalities of gait and mobility  Muscle weakness (generalized)  Rationale for Evaluation and Treatment: Rehabilitation  ONSET DATE:    SUBJECTIVE:   SUBJECTIVE STATEMENT:  Pt feeling good today.  States pain in L rib/torso and legs a bit better since last visit, but states soreness at night in calves. She did see MD last week or so for these issues.     Has had 3 falls, at home in and outside.  Notes OA in L knee. Notes achiness in both legs, knees and lower legs.  Tightness around L breast/chest/- not from fall. Tightness in chest- last week. -- used nitroglycerin  and improved.  Some low back pain also.  Denies any dizziness/lightheaded.  Lives alone- has stairs, but does not have to go up.  2 steps to get in, no railing  Does not drive- son drives her  Shoes: wearing black dress shoes/ slight heel.  Wears tennis shoes at home.  She does some exercise seated in her chair.  Was doing more standing exercises but not doing now because of falls. Also has been walking less due to falls.  AD: not using today (thinks she has a cane at home)    PERTINENT HISTORY: DM, Osteoporosis, Heart failure, CKD, PSVT, NO pacemaker   PAIN:  Are you having pain? Yes: NPRS scale: up to 8 /10 Pain location: L knee and lower legs  Pain description: achey  Aggravating factors: increased activity  Relieving factors: none stated   PRECAUTIONS: Fall  WEIGHT BEARING RESTRICTIONS: No  FALLS:  Has patient fallen in last 6 months? Yes. Number of falls 3 at home, in house and outside of house   PLOF: Independent   PATIENT GOALS:  Decreased falls,   NEXT MD VISIT:   OBJECTIVE:   DIAGNOSTIC FINDINGS:   PATIENT SURVEYS:    COGNITION: Overall cognitive status: Within functional limits for tasks assessed     SENSATION: WFL  EDEMA:  Edema in bil feet/lower legs  L>R    POSTURE:    No Significant postural  limitations  PALPATION: L thoracic: mild pain to palpate, no pain in ribs, no pain with L side bending slight pain with R side bending.   LOWER EXTREMITY ROM: Hips: WFL Knees: mild limitation on L Ankles: mod limitation for DF bil; , weakness on L >R for DF   LOWER EXTREMITY MMT:  MMT Left eval Right  eval  Hip flexion    Hip extension    Hip abduction    Hip adduction    Hip internal rotation    Hip external rotation    Knee flexion 4 4  Knee extension 4 4  Ankle dorsiflexion 3 4-  Ankle plantarflexion    Ankle inversion    Ankle eversion     (Blank rows = not tested)  LOWER EXTREMITY SPECIAL TESTS:    FUNCTIONAL TESTS:   TUG:  15.60,  no AD, using  hands to rise from chair.  Sit to stand: unable to do without use of hands on chair     GAIT: Distance walked: 150 ft  Assistive device utilized: None Level of assistance: Complete Independence Comments: slow gait, cautious, L knee in flexion.    TODAY'S TREATMENT:                                                                                                                              DATE:   07/25/2024 Therapeutic Exercise: Aerobic: Supine:  Seated:  ankle pumps  x 15 bil;   LAQ 2 x 10 bil;  Standing:  Heel raises x 10 Stretches:   hamstring stretch 20 sec x 3 bil; gastroc stretch at counter x 3 bil;  Neuromuscular Re-education: Gait: ambulation with education on use of SPC 45 ft x  8  Manual Therapy: Therapeutic Activity: Sit to stand from higher mat table- hands on legs , then hands reaching in front 2 x 5,   Toe taps on 4 in step x 20 Step ups 4 in step and 6 in step ( 1 Ue light hand held support)  x 10 ea bil;  Self Care:    07/18/24:  Therapeutic Exercise: Aerobic: Supine:  Seated:  ankle pumps  x 15 bil;   LAQ 2 x 10 bil;  Standing:   Stretches:   hamstring stretch 20 sec x 3 bil;  Neuromuscular Re-education: Manual Therapy: Therapeutic Activity: Sit to stand from higher mat table-  hands on legs 2 x 5,  From regular chair height- 2 light Ue support- education for optimal mechanics.  Toe taps on 6 in step x 20 Step ups 4 in step ( 2 Ue light hand held support)  x 10 bil;  Self Care:   Eval:   Ther ex: seated ankle pumps 2 x 10-education for HEP   PATIENT EDUCATION:  Education details: updated and reviewed HEP.  Person educated: Patient Education method: Explanation, Demonstration, Tactile cues, Verbal cues, and Handouts Education comprehension: verbalized understanding, returned demonstration, verbal cues required, tactile cues required, and needs further education   HOME EXERCISE PROGRAM: Access Code: [email or web address] URL: [email or web address] Date: 07/03/2024 Prepared by: Tinnie Don  Exercises - Seated Ankle Pumps on Table  - 2 x daily - 1-2 sets - 10 reps - Seated Ankle Pumps  - 2 x daily - 1 sets - 10 reps  ASSESSMENT:  CLINICAL IMPRESSION: Good ability for ambulation using SPC, will try to use outside in driveway with son going with her for the 1st time or 2. She is walking indoors, but previously was walking outdoors prior to her fall. She is doing well with LE strength, and will benefit from progression of strength and balance. Updated HEP to include gastroc stretch and strength to do in evening  to see if it improves pain at night.    Eval: Patient presents with primary complaint of  decreasing balance and increased falls. She  has decreased sores on balance testing today. She has lack of effective HEP for decreased mobility. She will benefit from improving mobility so she can get back to doing her previous exercise and walking without fear of falling. She does have OA in knee that is effecting LE stability some.  Pt with decreased ability for full functional activities. Pt will  benefit from skilled PT to improve deficits and pain and to return to PLOF.   OBJECTIVE IMPAIRMENTS: Abnormal gait, decreased activity tolerance, decreased balance,  decreased mobility, decreased ROM, decreased strength, and pain.   ACTIVITY LIMITATIONS: bending, standing, squatting, stairs, transfers, and locomotion level  PARTICIPATION LIMITATIONS: meal prep, cleaning, shopping, and community activity  PERSONAL FACTORS: Age, Fitness, Past/current experiences, and Time since onset of injury/illness/exacerbation are also affecting patient's functional outcome.   REHAB POTENTIAL: Good  CLINICAL DECISION MAKING: Evolving/moderate complexity  EVALUATION COMPLEXITY: Moderate   GOALS: Goals reviewed with patient? Yes  SHORT TERM GOALS: Target date: 07/24/2024   Pt to be independent with initial HEP  Goal status: INITIAL  2.  Pt to demo ability to rise/sit to stand from higher mat table with minimal use of hands.   Goal status: INITIAL     LONG TERM GOALS: Target date: 08/28/2024   Pt to be independent with final HEP  Goal status: INITIAL  2.  Pt to demo ability to rise from chair with minimal use of UEs, consistently   Goal status: INITIAL  3.  Pt to demo improved score on BERG by at least 4 points.   Goal status: INITIAL  4.  Pt to demo dynamic walking, and stairs with balance WFL for pt age.   Goal status: INITIAL    PLAN:  PT FREQUENCY: 1-2x/week  PT DURATION: 8 weeks  PLANNED INTERVENTIONS: Therapeutic exercises, Therapeutic activity, Neuromuscular re-education, Patient/Family education, Self Care, Joint mobilization, Joint manipulation, Stair training, Orthotic/Fit training, DME instructions, Aquatic Therapy, Dry Needling, Electrical stimulation, Cryotherapy, Moist heat, Taping, Ultrasound, Ionotophoresis 4mg /ml Dexamethasone, Manual therapy,  Vasopneumatic device, Traction, Spinal manipulation, Spinal mobilization,Balance training, Gait training,   PLAN FOR NEXT SESSION:    Tinnie Don, PT, DPT 10:12 AM  07/25/24

## 2024-07-26 ENCOUNTER — Telehealth: Payer: Self-pay | Admitting: Internal Medicine

## 2024-07-26 MED ORDER — DISOPYRAMIDE PHOSPHATE 150 MG PO CAPS: 150.0000 mg | ORAL_CAPSULE | Freq: Two times a day (BID) | ORAL | 1 refills | Status: AC

## 2024-07-26 NOTE — Telephone Encounter (Signed)
 Refill sent.

## 2024-07-26 NOTE — Telephone Encounter (Signed)
*  STAT* If patient is at the pharmacy, call can be transferred to refill team.   1. Which medications need to be refilled? (please list name of each medication and dose if known) disopyramide  (NORPACE ) 150 MG capsule    2. Would you like to learn more about the convenience, safety, & potential cost savings by using the [facility]? no   3. Are you open to using the [facility] (Type [facility].  ).no   4. Which pharmacy/location (including street and city if local pharmacy) is medication to be sent to? [id number] - [place], [place] - [address]    5. Do they need a 30 day or 90 day supply? 90 day  Patient is out of medication

## 2024-08-05 ENCOUNTER — Encounter: Payer: Self-pay | Admitting: Physical Therapy

## 2024-08-05 ENCOUNTER — Ambulatory Visit: Admitting: Physical Therapy

## 2024-08-05 DIAGNOSIS — R2689 Other abnormalities of gait and mobility: Secondary | ICD-10-CM

## 2024-08-05 DIAGNOSIS — M6281 Muscle weakness (generalized): Secondary | ICD-10-CM

## 2024-08-05 NOTE — Therapy (Unsigned)
 OUTPATIENT PHYSICAL THERAPY LOWER EXTREMITY TREATMENT   Patient Name: Monique [name] MRN: [id number] DOB:20-Sep-1935, 88 y.o., female Today's Date: 08/05/24   END OF SESSION:  PT End of Session - 08/05/24 1150     Visit Number 4    Number of Visits 16    Date for Recertification  08/29/24    Authorization Type [employer]- submitted    PT Start Time 1020    PT Stop Time 1103    PT Time Calculation (min) 43 min    Equipment Utilized During Treatment Gait belt    Activity Tolerance Patient tolerated treatment well    Behavior During Therapy WFL for tasks assessed/performed            Past Medical History:  Diagnosis Date   Abdominal pain 11/18/2015   Acute respiratory failure with hypoxia (HCC) 06/27/2014   Breast pain, left 10/11/2016   Cerebral aneurysm 2000 06/27/2014   Chest pain 10/30/2014   CHF (congestive heart failure) (HCC)    Chronic anticoagulation 06/2014   Coumadin  started after bilateral PE   Chronic kidney disease, stage III (moderate) (HCC) 11/21/2014   Clotting disorder    right was removed, still have left cataract   Diabetes (HCC)    Dizziness and giddiness 11/28/2014   DVT of lower extremity (deep venous thrombosis) (HCC) 11/28/2014   Dysuria 07/07/2014   Elevated troponin 11/28/2014   Fatty liver    Gastritis 05/16/2012   Chronic dyspepsia on PPI daily.    Headache, acute 06/08/2010   Qualifier: Diagnosis of  By: Harlow MD, Ozell BRAVO    Heart murmur    Hematochezia 11/18/2015   History of fibromuscular dysplasia 06/27/2014   HTN (hypertension)    Essential   Hyperlipidemia    Hypertrophic obstructive cardiomyopathy (HOCM) (HCC) 05/20/2008   CMR 4/22: EF 72, small amount of patchy LGE in basal septum c/w HCM, asymmetric basal septal hypertrophy (15 mm) c/w HCM, no ischemia, ascending aorta 38 mm   Kidney stones    LVH (left ventricular hypertrophy)    Orthostatic hypotension 11/28/2014   PAF (paroxysmal atrial fibrillation) (HCC) 2000   Palpitations  11/28/2014   PAROXYSMAL ATRIAL FIBRILLATION 05/20/2008   Qualifier: Diagnosis of  By: Harlow MD, Ozell BRAVO    PE (pulmonary embolism) 06/2014   Bilateral   Postmenopausal osteoporosis 04/2019   T score -3.3   Pulmonary embolism, bilateral (HCC) 06/27/2014   SOB (shortness of breath) 06/27/2014   Subarachnoid hemorrhage (HCC) 2000   UTI (urinary tract infection) 01/04/2015   Past Surgical History:  Procedure Laterality Date   ABDOMINAL HYSTERECTOMY  2000   Brain hemorrage     2000   CARDIAC CATHETERIZATION  05/2008   Nl cors, no gradient, EF 60%   Patient Active Problem List   Diagnosis Date Noted   Burning sensation of feet 07/22/2024   RLQ abdominal pain 06/13/2024   Recurrent falls while walking 06/13/2024   Balance problem 06/13/2024   Renal stone 06/13/2024   Leg swelling 02/29/2024   Chronic diastolic (congestive) heart failure (HCC) 06/06/2023   Routine general medical examination at a health care facility 05/06/2022   Chronic left shoulder pain 11/04/2021   Osteoporosis 03/19/2021   Muscle spasm 01/02/2020   Thoracic aortic aneurysm without rupture 12/11/2019   PSVT (paroxysmal supraventricular tachycardia) 08/22/2019   Atypical chest pain 08/21/2019   Blood in stool 01/04/2018   Diabetes mellitus type 2 with complications (HCC) 09/21/2017   Allergic rhinitis 06/20/2017   Headache 10/23/2015  Frequent urination 05/06/2015   Chronic RLQ pain 04/24/2015   Chronic kidney disease, stage III (moderate) (HCC) 11/21/2014   Dysuria 07/07/2014   Pulmonary embolism, bilateral (HCC) 06/27/2014   Cerebral aneurysm 2000 06/27/2014   Hyperlipidemia associated with type 2 diabetes mellitus (HCC) 05/20/2008   Essential hypertension 05/20/2008   Hypertrophic obstructive cardiomyopathy (HOCM) (HCC) 05/20/2008   PAROXYSMAL ATRIAL FIBRILLATION 05/20/2008   Subarachnoid hemorrhage 2000 05/20/2008     PCP: Almarie Cleveland  REFERRING PROVIDER: Corean Ku ,  FNP  REFERRING DIAG: Recurrent falls, Osteoporosis   THERAPY DIAG:  No diagnosis found.  Rationale for Evaluation and Treatment: Rehabilitation  ONSET DATE:    SUBJECTIVE:   SUBJECTIVE STATEMENT:  Pt feeling good today.  States pain in L rib/torso has been better for a few weeks,  but states soreness in legs during the day and at night. Feeling soreness in R hip at night, and lower legs.     Has had 3 falls, at home in and outside.  Notes OA in L knee. Notes achiness in both legs, knees and lower legs.  Tightness around L breast/chest/- not from fall. Tightness in chest- last week. -- used nitroglycerin  and improved.  Some low back pain also.  Denies any dizziness/lightheaded.  Lives alone- has stairs, but does not have to go up.  2 steps to get in, no railing  Does not drive- son drives her  Shoes: wearing black dress shoes/ slight heel.  Wears tennis shoes at home.  She does some exercise seated in her chair.  Was doing more standing exercises but not doing now because of falls. Also has been walking less due to falls.  AD: not using today (thinks she has a cane at home)    PERTINENT HISTORY: DM, Osteoporosis, Heart failure, CKD, PSVT, NO pacemaker   PAIN:  Are you having pain? Yes: NPRS scale: up to 8 /10 Pain location: L knee and lower legs  Pain description: achey  Aggravating factors: increased activity  Relieving factors: none stated   PRECAUTIONS: Fall  WEIGHT BEARING RESTRICTIONS: No  FALLS:  Has patient fallen in last 6 months? Yes. Number of falls 3 at home, in house and outside of house   PLOF: Independent   PATIENT GOALS:  Decreased falls,   NEXT MD VISIT:   OBJECTIVE:   DIAGNOSTIC FINDINGS:   PATIENT SURVEYS:    COGNITION: Overall cognitive status: Within functional limits for tasks assessed     SENSATION: WFL  EDEMA:  Edema in bil feet/lower legs  L>R    POSTURE:    No Significant postural limitations  PALPATION: L thoracic:  mild pain to palpate, no pain in ribs, no pain with L side bending slight pain with R side bending.   LOWER EXTREMITY ROM: Hips: WFL Knees: mild limitation on L Ankles: mod limitation for DF bil; , weakness on L >R for DF   LOWER EXTREMITY MMT:  MMT Left eval Right  eval  Hip flexion    Hip extension    Hip abduction    Hip adduction    Hip internal rotation    Hip external rotation    Knee flexion 4 4  Knee extension 4 4  Ankle dorsiflexion 3 4-  Ankle plantarflexion    Ankle inversion    Ankle eversion     (Blank rows = not tested)  LOWER EXTREMITY SPECIAL TESTS:    FUNCTIONAL TESTS:   TUG:  15.60,  no AD, using hands to rise from  chair.  Sit to stand: unable to do without use of hands on chair     GAIT: Distance walked: 150 ft  Assistive device utilized: None Level of assistance: Complete Independence Comments: slow gait, cautious, L knee in flexion.    TODAY'S TREATMENT:                                                                                                                              DATE:   08/05/2024 Therapeutic Exercise: Aerobic: Supine:  Seated:   LAQ 2.5 lb,   2 x 10 bil;  Standing:  Heel raises x 10;  hip abd 2 x 10 bil;   Stretches:    Gastroc stretch at counter x 3 bil;  Neuromuscular Re-education: Toe taps on 6 in step x 20, no UE support  3 way step with weight shift at counter, no UE support x 10 bil Side stepping x 6 no UE support  Gait:  Manual Therapy: Therapeutic Activity: Sit to stand from higher mat table- hands on legs 2 x 5,   Step ups  6 in step (1 Ue light hand held support)  x 10 ea bil;  Marching at counter 2 x 10;  Self Care:    Therapeutic Exercise: Aerobic: Supine:  Seated:  ankle pumps  x 15 bil;   LAQ 2 x 10 bil;  Standing:  Heel raises x 10 Stretches:   hamstring stretch 20 sec x 3 bil; gastroc stretch at counter x 3 bil;  Neuromuscular Re-education: Gait: ambulation with education on use of SPC 45 ft  x  8  Manual Therapy: Therapeutic Activity: Sit to stand from higher mat table- hands on legs , then hands reaching in front 2 x 5,   Toe taps on 4 in step x 20 Step ups 4 in step and 6 in step ( 1 Ue light hand held support)  x 10 ea bil;  Self Care:    07/18/24:  Therapeutic Exercise: Aerobic: Supine:  Seated:  ankle pumps  x 15 bil;   LAQ 2 x 10 bil;  Standing:   Stretches:   hamstring stretch 20 sec x 3 bil;  Neuromuscular Re-education: Manual Therapy: Therapeutic Activity: Sit to stand from higher mat table- hands on legs 2 x 5,  From regular chair height- 2 light Ue support- education for optimal mechanics.  Toe taps on 6 in step x 20 Step ups 4 in step ( 2 Ue light hand held support)  x 10 bil;  Self Care:   Eval:   Ther ex: seated ankle pumps 2 x 10-education for HEP   PATIENT EDUCATION:  Education details: updated and reviewed HEP.  Person educated: Patient Education method: Explanation, Demonstration, Tactile cues, Verbal cues, and Handouts Education comprehension: verbalized understanding, returned demonstration, verbal cues required, tactile cues required, and needs further education   HOME EXERCISE PROGRAM: Access Code: [email or web address] URL: [email or web address] Date: 07/03/2024 Prepared by: Tinnie Don  Exercises -  Seated Ankle Pumps on Table  - 2 x daily - 1-2 sets - 10 reps - Seated Ankle Pumps  - 2 x daily - 1 sets - 10 reps  ASSESSMENT:  CLINICAL IMPRESSION: She is doing well with LE strength, progressed standing activity today. She will benefit from continued strength and balance. She continues to have bothersome pain in LEs mostly at night. Reviewed use of compression socks, as she has worn in the past but is not currently wearing consistently.   Eval: Patient presents with primary complaint of  decreasing balance and increased falls. She has decreased sores on balance testing today. She has lack of effective HEP for decreased  mobility. She will benefit from improving mobility so she can get back to doing her previous exercise and walking without fear of falling. She does have OA in knee that is effecting LE stability some.  Pt with decreased ability for full functional activities. Pt will  benefit from skilled PT to improve deficits and pain and to return to PLOF.   OBJECTIVE IMPAIRMENTS: Abnormal gait, decreased activity tolerance, decreased balance, decreased mobility, decreased ROM, decreased strength, and pain.   ACTIVITY LIMITATIONS: bending, standing, squatting, stairs, transfers, and locomotion level  PARTICIPATION LIMITATIONS: meal prep, cleaning, shopping, and community activity  PERSONAL FACTORS: Age, Fitness, Past/current experiences, and Time since onset of injury/illness/exacerbation are also affecting patient's functional outcome.   REHAB POTENTIAL: Good  CLINICAL DECISION MAKING: Evolving/moderate complexity  EVALUATION COMPLEXITY: Moderate   GOALS: Goals reviewed with patient? Yes  SHORT TERM GOALS: Target date: 07/24/2024   Pt to be independent with initial HEP  Goal status:  MET  2.  Pt to demo ability to rise/sit to stand from higher mat table with minimal use of hands.   Goal status: INITIAL    LONG TERM GOALS: Target date: 08/28/2024   Pt to be independent with final HEP  Goal status: INITIAL  2.  Pt to demo ability to rise from chair with minimal use of UEs, consistently   Goal status: INITIAL  3.  Pt to demo improved score on BERG by at least 4 points.   Goal status: INITIAL  4.  Pt to demo dynamic walking, and stairs with balance WFL for pt age.   Goal status: INITIAL    PLAN:  PT FREQUENCY: 1-2x/week  PT DURATION: 8 weeks  PLANNED INTERVENTIONS: Therapeutic exercises, Therapeutic activity, Neuromuscular re-education, Patient/Family education, Self Care, Joint mobilization, Joint manipulation, Stair training, Orthotic/Fit training, DME instructions,  Aquatic Therapy, Dry Needling, Electrical stimulation, Cryotherapy, Moist heat, Taping, Ultrasound, Ionotophoresis 4mg /ml Dexamethasone, Manual therapy,  Vasopneumatic device, Traction, Spinal manipulation, Spinal mobilization,Balance training, Gait training,   PLAN FOR NEXT SESSION:    Tinnie Don, PT, DPT 11:52 AM  08/05/24

## 2024-08-12 ENCOUNTER — Encounter: Payer: Self-pay | Admitting: Physical Therapy

## 2024-08-12 ENCOUNTER — Ambulatory Visit: Admitting: Physical Therapy

## 2024-08-12 DIAGNOSIS — M6281 Muscle weakness (generalized): Secondary | ICD-10-CM | POA: Diagnosis not present

## 2024-08-12 DIAGNOSIS — R2689 Other abnormalities of gait and mobility: Secondary | ICD-10-CM

## 2024-08-12 NOTE — Therapy (Signed)
 OUTPATIENT PHYSICAL THERAPY LOWER EXTREMITY TREATMENT   Patient Name: Monique [name] MRN: [id number] DOB:Oct 04, 1935, 88 y.o., female Today's Date: 08/12/24   END OF SESSION:  PT End of Session - 08/12/24 1110     Visit Number 5    Number of Visits 16    Date for Recertification  08/29/24    Authorization Type [employer]- submitted    PT Start Time 1108    PT Stop Time 1146    PT Time Calculation (min) 38 min    Equipment Utilized During Treatment Gait belt    Activity Tolerance Patient tolerated treatment well    Behavior During Therapy WFL for tasks assessed/performed             Past Medical History:  Diagnosis Date   Abdominal pain 11/18/2015   Acute respiratory failure with hypoxia (HCC) 06/27/2014   Breast pain, left 10/11/2016   Cerebral aneurysm 2000 06/27/2014   Chest pain 10/30/2014   CHF (congestive heart failure) (HCC)    Chronic anticoagulation 06/2014   Coumadin  started after bilateral PE   Chronic kidney disease, stage III (moderate) (HCC) 11/21/2014   Clotting disorder    right was removed, still have left cataract   Diabetes (HCC)    Dizziness and giddiness 11/28/2014   DVT of lower extremity (deep venous thrombosis) (HCC) 11/28/2014   Dysuria 07/07/2014   Elevated troponin 11/28/2014   Fatty liver    Gastritis 05/16/2012   Chronic dyspepsia on PPI daily.    Headache, acute 06/08/2010   Qualifier: Diagnosis of  By: Harlow MD, Ozell BRAVO    Heart murmur    Hematochezia 11/18/2015   History of fibromuscular dysplasia 06/27/2014   HTN (hypertension)    Essential   Hyperlipidemia    Hypertrophic obstructive cardiomyopathy (HOCM) (HCC) 05/20/2008   CMR 4/22: EF 72, small amount of patchy LGE in basal septum c/w HCM, asymmetric basal septal hypertrophy (15 mm) c/w HCM, no ischemia, ascending aorta 38 mm   Kidney stones    LVH (left ventricular hypertrophy)    Orthostatic hypotension 11/28/2014   PAF (paroxysmal atrial fibrillation) (HCC) 2000   Palpitations  11/28/2014   PAROXYSMAL ATRIAL FIBRILLATION 05/20/2008   Qualifier: Diagnosis of  By: Harlow MD, Ozell BRAVO    PE (pulmonary embolism) 06/2014   Bilateral   Postmenopausal osteoporosis 04/2019   T score -3.3   Pulmonary embolism, bilateral (HCC) 06/27/2014   SOB (shortness of breath) 06/27/2014   Subarachnoid hemorrhage (HCC) 2000   UTI (urinary tract infection) 01/04/2015   Past Surgical History:  Procedure Laterality Date   ABDOMINAL HYSTERECTOMY  2000   Brain hemorrage     2000   CARDIAC CATHETERIZATION  05/2008   Nl cors, no gradient, EF 60%   Patient Active Problem List   Diagnosis Date Noted   Burning sensation of feet 07/22/2024   RLQ abdominal pain 06/13/2024   Recurrent falls while walking 06/13/2024   Balance problem 06/13/2024   Renal stone 06/13/2024   Leg swelling 02/29/2024   Chronic diastolic (congestive) heart failure (HCC) 06/06/2023   Routine general medical examination at a health care facility 05/06/2022   Chronic left shoulder pain 11/04/2021   Osteoporosis 03/19/2021   Muscle spasm 01/02/2020   Thoracic aortic aneurysm without rupture 12/11/2019   PSVT (paroxysmal supraventricular tachycardia) 08/22/2019   Atypical chest pain 08/21/2019   Blood in stool 01/04/2018   Diabetes mellitus type 2 with complications (HCC) 09/21/2017   Allergic rhinitis 06/20/2017   Headache 10/23/2015  Frequent urination 05/06/2015   Chronic RLQ pain 04/24/2015   Chronic kidney disease, stage III (moderate) (HCC) 11/21/2014   Dysuria 07/07/2014   Pulmonary embolism, bilateral (HCC) 06/27/2014   Cerebral aneurysm 2000 06/27/2014   Hyperlipidemia associated with type 2 diabetes mellitus (HCC) 05/20/2008   Essential hypertension 05/20/2008   Hypertrophic obstructive cardiomyopathy (HOCM) (HCC) 05/20/2008   PAROXYSMAL ATRIAL FIBRILLATION 05/20/2008   Subarachnoid hemorrhage 2000 05/20/2008     PCP: Almarie Cleveland  REFERRING PROVIDER: Corean Ku ,  FNP  REFERRING DIAG: Recurrent falls, Osteoporosis   THERAPY DIAG:  Other abnormalities of gait and mobility  Muscle weakness (generalized)  Rationale for Evaluation and Treatment: Rehabilitation  ONSET DATE:    SUBJECTIVE:   SUBJECTIVE STATEMENT: Pt did try to wear compression socks for a couple days, feet were burning  while wearing them so she took them off, but does think it helped her leg pain.  L leg was sore after last visit- lower leg.     Has had 3 falls, at home in and outside.  Notes OA in L knee. Notes achiness in both legs, knees and lower legs.  Tightness around L breast/chest/- not from fall. Tightness in chest- last week. -- used nitroglycerin  and improved.  Some low back pain also.  Denies any dizziness/lightheaded.  Lives alone- has stairs, but does not have to go up.  2 steps to get in, no railing  Does not drive- son drives her  Shoes: wearing black dress shoes/ slight heel.  Wears tennis shoes at home.  She does some exercise seated in her chair.  Was doing more standing exercises but not doing now because of falls. Also has been walking less due to falls.  AD: not using today (thinks she has a cane at home)    PERTINENT HISTORY: DM, Osteoporosis, Heart failure, CKD, PSVT, NO pacemaker   PAIN:  Are you having pain? Yes: NPRS scale: up to 8 /10 Pain location: L knee and lower legs  Pain description: achey  Aggravating factors: increased activity  Relieving factors: none stated   PRECAUTIONS: Fall  WEIGHT BEARING RESTRICTIONS: No  FALLS:  Has patient fallen in last 6 months? Yes. Number of falls 3 at home, in house and outside of house   PLOF: Independent   PATIENT GOALS:  Decreased falls,   NEXT MD VISIT:   OBJECTIVE:   DIAGNOSTIC FINDINGS:   PATIENT SURVEYS:    COGNITION: Overall cognitive status: Within functional limits for tasks assessed     SENSATION: WFL  EDEMA:  Edema in bil feet/lower legs  L>R    POSTURE:    No  Significant postural limitations  PALPATION: L thoracic: mild pain to palpate, no pain in ribs, no pain with L side bending slight pain with R side bending.   LOWER EXTREMITY ROM: Hips: WFL Knees: mild limitation on L Ankles: mod limitation for DF bil; , weakness on L >R for DF   LOWER EXTREMITY MMT:  MMT Left eval Right  eval  Hip flexion    Hip extension    Hip abduction    Hip adduction    Hip internal rotation    Hip external rotation    Knee flexion 4 4  Knee extension 4 4  Ankle dorsiflexion 3 4-  Ankle plantarflexion    Ankle inversion    Ankle eversion     (Blank rows = not tested)  LOWER EXTREMITY SPECIAL TESTS:    FUNCTIONAL TESTS:   TUG:  15.60,  no AD, using hands to rise from chair.  Sit to stand: unable to do without use of hands on chair     GAIT: Distance walked: 150 ft  Assistive device utilized: None Level of assistance: Complete Independence Comments: slow gait, cautious, L knee in flexion.    TODAY'S TREATMENT:                                                                                                                              DATE:   08/12/2024 Therapeutic Exercise: Aerobic: Supine:  Seated:    Standing:   Stretches:     Neuromuscular Re-education: Toe taps  (2 risers)  step x 20, no UE support  3 way step with weight shift at counter, no UE support x 10 bil Side stepping x 10  no UE support  Gait:  Manual Therapy: Therapeutic Activity: Sit to stand from higher mat table-  hands on legs 3 x 5,   Marching at counter 2 x 10;  Heel raises x 10;  hip abd 2 x 10 bil;   Self Care:   Therapeutic Exercise: Aerobic: Supine:  Seated:   LAQ 2.5 lb,   2 x 10 bil;  Standing:  Heel raises x 10;  hip abd 2 x 10 bil;   Stretches:    Gastroc stretch at counter x 3 bil;  Neuromuscular Re-education: Toe taps on 6 in step x 20, no UE support  3 way step with weight shift at counter, no UE support x 10 bil Side stepping x 6 no UE  support  Gait:  Manual Therapy: Therapeutic Activity: Sit to stand from higher mat table- hands on legs 2 x 5,   Step ups  6 in step (1 Ue light hand held support)  x 10 ea bil;  Marching at counter 2 x 10;  Self Care:    Therapeutic Exercise: Aerobic: Supine:  Seated:  ankle pumps  x 15 bil;   LAQ 2 x 10 bil;  Standing:  Heel raises x 10 Stretches:   hamstring stretch 20 sec x 3 bil; gastroc stretch at counter x 3 bil;  Neuromuscular Re-education: Gait: ambulation with education on use of SPC 45 ft x  8  Manual Therapy: Therapeutic Activity: Sit to stand from higher mat table- hands on legs , then hands reaching in front 2 x 5,   Toe taps on 4 in step x 20 Step ups 4 in step and 6 in step ( 1 Ue light hand held support)  x 10 ea bil;  Self Care:    07/18/24:  Therapeutic Exercise: Aerobic: Supine:  Seated:  ankle pumps  x 15 bil;   LAQ 2 x 10 bil;  Standing:   Stretches:   hamstring stretch 20 sec x 3 bil;  Neuromuscular Re-education: Manual Therapy: Therapeutic Activity: Sit to stand from higher mat table- hands on legs 2 x 5,  From regular chair height- 2 light  Ue support- education for optimal mechanics.  Toe taps on 6 in step x 20 Step ups 4 in step ( 2 Ue light hand held support)  x 10 bil;  Self Care:   Eval:   Ther ex: seated ankle pumps 2 x 10-education for HEP   PATIENT EDUCATION:  Education details: updated and reviewed HEP.  Person educated: Patient Education method: Explanation, Demonstration, Tactile cues, Verbal cues, and Handouts Education comprehension: verbalized understanding, returned demonstration, verbal cues required, tactile cues required, and needs further education   HOME EXERCISE PROGRAM: Access Code: [email or web address] URL: [email or web address] Date: 07/03/2024 Prepared by: Tinnie Don  Exercises - Seated Ankle Pumps on Table  - 2 x daily - 1-2 sets - 10 reps - Seated Ankle Pumps  - 2 x daily - 1 sets - 10  reps  ASSESSMENT:  CLINICAL IMPRESSION: Pt with good ability for activity. She has no lob with balance today. She is progressing well, but has not had significant relief of LE pain, which may be more neuropathy related. She does have  Slight discomfort in L lateral ribs/lateral chest, after standing activity. Decreased after seated rest. She has had this ongoing in recent past, but has not felt lately. She did see PCP For this recently. Pain could be muscular in nature, but pt reports feeling it more when she increases her heart rate lately. Reviewed need to f/u with MD if this is the case. Pt states understanding. She will be seen for 1 more visit next week, and will decide d/c to HEP vs continue  PT.   She is doing well with LE strength, progressed standing activity today. She will benefit from continued strength and balance. She continues to have bothersome pain in LEs mostly at night. Reviewed use of compression socks, as she has worn in the past but is not currently wearing consistently.   Eval: Patient presents with primary complaint of  decreasing balance and increased falls. She has decreased sores on balance testing today. She has lack of effective HEP for decreased mobility. She will benefit from improving mobility so she can get back to doing her previous exercise and walking without fear of falling. She does have OA in knee that is effecting LE stability some.  Pt with decreased ability for full functional activities. Pt will  benefit from skilled PT to improve deficits and pain and to return to PLOF.   OBJECTIVE IMPAIRMENTS: Abnormal gait, decreased activity tolerance, decreased balance, decreased mobility, decreased ROM, decreased strength, and pain.   ACTIVITY LIMITATIONS: bending, standing, squatting, stairs, transfers, and locomotion level  PARTICIPATION LIMITATIONS: meal prep, cleaning, shopping, and community activity  PERSONAL FACTORS: Age, Fitness, Past/current experiences, and  Time since onset of injury/illness/exacerbation are also affecting patient's functional outcome.   REHAB POTENTIAL: Good  CLINICAL DECISION MAKING: Evolving/moderate complexity  EVALUATION COMPLEXITY: Moderate   GOALS: Goals reviewed with patient? Yes  SHORT TERM GOALS: Target date: 07/24/2024   Pt to be independent with initial HEP  Goal status:  MET  2.  Pt to demo ability to rise/sit to stand from higher mat table with minimal use of hands.   Goal status: INITIAL    LONG TERM GOALS: Target date: 08/28/2024   Pt to be independent with final HEP  Goal status: INITIAL  2.  Pt to demo ability to rise from chair with minimal use of UEs, consistently   Goal status: INITIAL  3.  Pt to demo improved score on BERG by at least 4 points.  Goal status: INITIAL  4.  Pt to demo dynamic walking, and stairs with balance WFL for pt age.   Goal status: INITIAL    PLAN:  PT FREQUENCY: 1-2x/week  PT DURATION: 8 weeks  PLANNED INTERVENTIONS: Therapeutic exercises, Therapeutic activity, Neuromuscular re-education, Patient/Family education, Self Care, Joint mobilization, Joint manipulation, Stair training, Orthotic/Fit training, DME instructions, Aquatic Therapy, Dry Needling, Electrical stimulation, Cryotherapy, Moist heat, Taping, Ultrasound, Ionotophoresis 4mg /ml Dexamethasone, Manual therapy,  Vasopneumatic device, Traction, Spinal manipulation, Spinal mobilization,Balance training, Gait training,   PLAN FOR NEXT SESSION: Gastroc stretch at counter x 3 bil;    Tinnie Don, PT, DPT 11:11 AM  08/12/24

## 2024-08-19 ENCOUNTER — Encounter: Payer: Self-pay | Admitting: Physical Therapy

## 2024-08-19 ENCOUNTER — Ambulatory Visit: Admitting: Physical Therapy

## 2024-08-19 DIAGNOSIS — R2689 Other abnormalities of gait and mobility: Secondary | ICD-10-CM | POA: Diagnosis not present

## 2024-08-19 DIAGNOSIS — M6281 Muscle weakness (generalized): Secondary | ICD-10-CM | POA: Diagnosis not present

## 2024-08-19 NOTE — Therapy (Signed)
 OUTPATIENT PHYSICAL THERAPY LOWER EXTREMITY TREATMENT/D/C   Patient Name: Tenya Araque MRN: [id number] DOB:Jan 08, 1936, 88 y.o., female Today's Date: 08/19/2024   END OF SESSION:  PT End of Session - 08/19/24 1105     Visit Number 6    Number of Visits 16    Date for Recertification  08/29/24    Authorization Type [employer]- submitted    PT Start Time 1106    PT Stop Time 1145    PT Time Calculation (min) 39 min    Equipment Utilized During Treatment Gait belt    Activity Tolerance Patient tolerated treatment well    Behavior During Therapy WFL for tasks assessed/performed             Past Medical History:  Diagnosis Date   Abdominal pain 11/18/2015   Acute respiratory failure with hypoxia (HCC) 06/27/2014   Breast pain, left 10/11/2016   Cerebral aneurysm 2000 06/27/2014   Chest pain 10/30/2014   CHF (congestive heart failure) (HCC)    Chronic anticoagulation 06/2014   Coumadin  started after bilateral PE   Chronic kidney disease, stage III (moderate) (HCC) 11/21/2014   Clotting disorder    right was removed, still have left cataract   Diabetes (HCC)    Dizziness and giddiness 11/28/2014   DVT of lower extremity (deep venous thrombosis) (HCC) 11/28/2014   Dysuria 07/07/2014   Elevated troponin 11/28/2014   Fatty liver    Gastritis 05/16/2012   Chronic dyspepsia on PPI daily.    Headache, acute 06/08/2010   Qualifier: Diagnosis of  By: Harlow MD, Ozell BRAVO    Heart murmur    Hematochezia 11/18/2015   History of fibromuscular dysplasia 06/27/2014   HTN (hypertension)    Essential   Hyperlipidemia    Hypertrophic obstructive cardiomyopathy (HOCM) (HCC) 05/20/2008   CMR 4/22: EF 72, small amount of patchy LGE in basal septum c/w HCM, asymmetric basal septal hypertrophy (15 mm) c/w HCM, no ischemia, ascending aorta 38 mm   Kidney stones    LVH (left ventricular hypertrophy)    Orthostatic hypotension 11/28/2014   PAF (paroxysmal atrial fibrillation) (HCC) 2000    Palpitations 11/28/2014   PAROXYSMAL ATRIAL FIBRILLATION 05/20/2008   Qualifier: Diagnosis of  By: Harlow MD, Ozell BRAVO    PE (pulmonary embolism) 06/2014   Bilateral   Postmenopausal osteoporosis 04/2019   T score -3.3   Pulmonary embolism, bilateral (HCC) 06/27/2014   SOB (shortness of breath) 06/27/2014   Subarachnoid hemorrhage (HCC) 2000   UTI (urinary tract infection) 01/04/2015   Past Surgical History:  Procedure Laterality Date   ABDOMINAL HYSTERECTOMY  2000   Brain hemorrage     2000   CARDIAC CATHETERIZATION  05/2008   Nl cors, no gradient, EF 60%   Patient Active Problem List   Diagnosis Date Noted   Burning sensation of feet 07/22/2024   RLQ abdominal pain 06/13/2024   Recurrent falls while walking 06/13/2024   Balance problem 06/13/2024   Renal stone 06/13/2024   Leg swelling 02/29/2024   Chronic diastolic (congestive) heart failure (HCC) 06/06/2023   Routine general medical examination at a health care facility 05/06/2022   Chronic left shoulder pain 11/04/2021   Osteoporosis 03/19/2021   Muscle spasm 01/02/2020   Thoracic aortic aneurysm without rupture 12/11/2019   PSVT (paroxysmal supraventricular tachycardia) 08/22/2019   Atypical chest pain 08/21/2019   Blood in stool 01/04/2018   Diabetes mellitus type 2 with complications (HCC) 09/21/2017   Allergic rhinitis 06/20/2017   Headache 10/23/2015  Frequent urination 05/06/2015   Chronic RLQ pain 04/24/2015   Chronic kidney disease, stage III (moderate) (HCC) 11/21/2014   Dysuria 07/07/2014   Pulmonary embolism, bilateral (HCC) 06/27/2014   Cerebral aneurysm 2000 06/27/2014   Hyperlipidemia associated with type 2 diabetes mellitus (HCC) 05/20/2008   Essential hypertension 05/20/2008   Hypertrophic obstructive cardiomyopathy (HOCM) (HCC) 05/20/2008   PAROXYSMAL ATRIAL FIBRILLATION 05/20/2008   Subarachnoid hemorrhage 2000 05/20/2008     PCP: Almarie Cleveland  REFERRING PROVIDER: Corean Ku ,  FNP  REFERRING DIAG: Recurrent falls, Osteoporosis   THERAPY DIAG:  Other abnormalities of gait and mobility  Muscle weakness (generalized)  Rationale for Evaluation and Treatment: Rehabilitation  ONSET DATE:    SUBJECTIVE:   SUBJECTIVE STATEMENT: Pt states legs have been better. She reports wearing compression socks for a few days/wk. She also still is having variable pain in L Chest wall.  Still states burning in both feet.    Has had 3 falls, at home in and outside.  Notes OA in L knee. Notes achiness in both legs, knees and lower legs.  Tightness around L breast/chest/- not from fall. Tightness in chest- last week. -- used nitroglycerin  and improved.  Some low back pain also.  Denies any dizziness/lightheaded.  Lives alone- has stairs, but does not have to go up.  2 steps to get in, no railing  Does not drive- son drives her  Shoes: wearing black dress shoes/ slight heel.  Wears tennis shoes at home.  She does some exercise seated in her chair.  Was doing more standing exercises but not doing now because of falls. Also has been walking less due to falls.  AD: not using today (thinks she has a cane at home)    PERTINENT HISTORY: DM, Osteoporosis, Heart failure, CKD, PSVT, NO pacemaker   PAIN:  Are you having pain? Yes: NPRS scale: up to 6 /10 Pain location: L knee and lower legs  Pain description: achey , variable, intermittent  Aggravating factors: increased activity  Relieving factors: none stated   PRECAUTIONS: Fall  WEIGHT BEARING RESTRICTIONS: No  FALLS:  Has patient fallen in last 6 months? Yes. Number of falls 3 at home, in house and outside of house   PLOF: Independent   PATIENT GOALS:  Decreased falls,   NEXT MD VISIT:   OBJECTIVE:   DIAGNOSTIC FINDINGS:   PATIENT SURVEYS:    COGNITION: Overall cognitive status: Within functional limits for tasks assessed     SENSATION: WFL  EDEMA:  Edema in bil feet/lower legs  L>R    POSTURE:     No Significant postural limitations  PALPATION:   LOWER EXTREMITY ROM: Hips: WFL Knees: mild limitation on L Ankles: mod limitation for DF bil; , weakness on L >R for DF   LOWER EXTREMITY MMT:  MMT Left 08/19/24 Right  08/19/24   Hip flexion     Hip extension     Hip abduction     Hip adduction     Hip internal rotation     Hip external rotation     Knee flexion 4+ 4+   Knee extension 4+ 4+   Ankle dorsiflexion 3 4-   Ankle plantarflexion     Ankle inversion     Ankle eversion      (Blank rows = not tested)  LOWER EXTREMITY SPECIAL TESTS:    FUNCTIONAL TESTS:   BERG: 46   [facility] PT Assessment - 08/19/24 0001       Standardized Balance  Assessment   Standardized Balance Assessment Berg Balance Test;Timed Up and Go Test      Berg Balance Test   Sit to Stand Able to stand  independently using hands    Standing Unsupported Able to stand safely 2 minutes    Sitting with Back Unsupported but Feet Supported on Floor or Stool Able to sit safely and securely 2 minutes    Stand to Sit Sits safely with minimal use of hands    Transfers Able to transfer safely, definite need of hands    Standing Unsupported with Eyes Closed Able to stand 10 seconds safely    Standing Unsupported with Feet Together Able to place feet together independently and stand 1 minute safely    From Standing, Reach Forward with Outstretched Arm Can reach forward >12 cm safely (5)    From Standing Position, Pick up Object from Floor Able to pick up shoe safely and easily    From Standing Position, Turn to Look Behind Over each Shoulder Looks behind one side only/other side shows less weight shift    Turn 360 Degrees Able to turn 360 degrees safely one side only in 4 seconds or less    Standing Unsupported, Alternately Place Feet on Step/Stool Able to stand independently and complete 8 steps >20 seconds    Standing Unsupported, One Foot in Front Able to take small step independently and hold 30 seconds     Standing on One Leg Able to lift leg independently and hold equal to or more than 3 seconds    Total Score 46             GAIT: Distance walked: 150 ft  Assistive device utilized: None Level of assistance: Complete Independence Comments: slower speed, low foot clearance.    TODAY'S TREATMENT:                                                                                                                              DATE:   08/19/2024 Therapeutic Exercise:education on HEP and ankle, leg mobility  Aerobic: Supine:  Seated:   LAQ 3lb x 20 bil;  Ankle pumps x 20 bil;  Standing:   Stretches:    gastroc stretch at counter x 3 bil;   Neuromuscular Re-education: Gait:  Manual Therapy: Therapeutic Activity: Sit to stand from higher mat table-  hands on legs 3 x 5,   Marching at counter 2 x 10;  Heel raises x 10;  hip abd 2 x 10 bil;   Physical Performance Measure: BERG- see scoring above  Self Care:   Therapeutic Exercise: Aerobic: Supine:  Seated:   LAQ 2.5 lb,   2 x 10 bil;  Standing:  Heel raises x 10;  hip abd 2 x 10 bil;   Stretches:    Gastroc stretch at counter x 3 bil;  Neuromuscular Re-education: Toe taps on 6 in step x 20, no UE support  3 way step with weight  shift at counter, no UE support x 10 bil Side stepping x 6 no UE support  Gait:  Manual Therapy: Therapeutic Activity: Sit to stand from higher mat table- hands on legs 2 x 5,   Step ups  6 in step (1 Ue light hand held support)  x 10 ea bil;  Marching at counter 2 x 10;  Self Care:    Therapeutic Exercise: Aerobic: Supine:  Seated:  ankle pumps  x 15 bil;   LAQ 2 x 10 bil;  Standing:  Heel raises x 10 Stretches:   hamstring stretch 20 sec x 3 bil; gastroc stretch at counter x 3 bil;  Neuromuscular Re-education: Gait: ambulation with education on use of SPC 45 ft x  8  Manual Therapy: Therapeutic Activity: Sit to stand from higher mat table- hands on legs , then hands reaching in front 2 x  5,   Toe taps on 4 in step x 20 Step ups 4 in step and 6 in step ( 1 Ue light hand held support)  x 10 ea bil;  Self Care:    07/18/24:  Therapeutic Exercise: Aerobic: Supine:  Seated:  ankle pumps  x 15 bil;   LAQ 2 x 10 bil;  Standing:   Stretches:   hamstring stretch 20 sec x 3 bil;  Neuromuscular Re-education: Manual Therapy: Therapeutic Activity: Sit to stand from higher mat table- hands on legs 2 x 5,  From regular chair height- 2 light Ue support- education for optimal mechanics.  Toe taps on 6 in step x 20 Step ups 4 in step ( 2 Ue light hand held support)  x 10 bil;  Self Care:   Eval:  Ther ex: seated ankle pumps 2 x 10-education for HEP   PATIENT EDUCATION:  Education details: updated and reviewed HEP.  Person educated: Patient Education method: Explanation, Demonstration, Tactile cues, Verbal cues, and Handouts Education comprehension: verbalized understanding, returned demonstration, verbal cues required, tactile cues required, and needs further education   HOME EXERCISE PROGRAM: Access Code: [email or web address] URL: [email or web address] Date: 07/03/2024 Prepared by: Tinnie Don  Exercises - Seated Ankle Pumps on Table  - 2 x daily - 1-2 sets - 10 reps - Seated Ankle Pumps  - 2 x daily - 1 sets - 10 reps  ASSESSMENT:  CLINICAL IMPRESSION: Pt has been seen for 6 visits. She is doing well with mobility at this time. She continues to have burning sensation in feet that has not improved since start of PT. She reports less pain in legs today, but this pain is also variable. We have reviewed final HEP for continuing ankle, foot, and LE mobility and strength, and importance of continuing for HEP. We also reviewed importance of trying to continue mobility and walking at home . Pt ready for d/c to HEP, pt in agreement with plan.   Eval: Patient presents with primary complaint of  decreasing balance and increased falls. She has decreased sores on balance  testing today. She has lack of effective HEP for decreased mobility. She will benefit from improving mobility so she can get back to doing her previous exercise and walking without fear of falling. She does have OA in knee that is effecting LE stability some.  Pt with decreased ability for full functional activities. Pt will  benefit from skilled PT to improve deficits and pain and to return to PLOF.   OBJECTIVE IMPAIRMENTS: Abnormal gait, decreased activity tolerance, decreased balance, decreased mobility, decreased ROM, decreased strength, and pain.  ACTIVITY LIMITATIONS: bending, standing, squatting, stairs, transfers, and locomotion level  PARTICIPATION LIMITATIONS: meal prep, cleaning, shopping, and community activity  PERSONAL FACTORS: Age, Fitness, Past/current experiences, and Time since onset of injury/illness/exacerbation are also affecting patient's functional outcome.   REHAB POTENTIAL: Good  CLINICAL DECISION MAKING: Evolving/moderate complexity  EVALUATION COMPLEXITY: Moderate   GOALS: Goals reviewed with patient? Yes  SHORT TERM GOALS: Target date: 07/24/2024   Pt to be independent with initial HEP  Goal status:  MET  2.  Pt to demo ability to rise/sit to stand from higher mat table with minimal use of hands.   Goal status: MET    LONG TERM GOALS: Target date: 08/28/2024   Pt to be independent with final HEP  Goal status: MET  2.  Pt to demo ability to rise from chair with minimal use of UEs, consistently   Goal status: MET- still requires use of hands from regular chair   3.  Pt to demo improved score on BERG by at least 4 points.   Goal status: MET  4.  Pt to demo dynamic walking, and stairs with balance WFL for pt age.   Goal status: MET    PLAN:  PT FREQUENCY: 1-2x/week  PT DURATION: 8 weeks  PLANNED INTERVENTIONS: Therapeutic exercises, Therapeutic activity, Neuromuscular re-education, Patient/Family education, Self Care, Joint  mobilization, Joint manipulation, Stair training, Orthotic/Fit training, DME instructions, Aquatic Therapy, Dry Needling, Electrical stimulation, Cryotherapy, Moist heat, Taping, Ultrasound, Ionotophoresis 4mg /ml Dexamethasone, Manual therapy,  Vasopneumatic device, Traction, Spinal manipulation, Spinal mobilization,Balance training, Gait training,   PLAN FOR NEXT SESSION:     Tinnie Don, PT, DPT 11:06 AM  08/19/2024   PHYSICAL THERAPY DISCHARGE SUMMARY  Visits from Start of Care: 6   Plan: Patient agrees to discharge.  Patient goals were met. Patient is being discharged due to meeting the stated rehab goals.     Tinnie Don, PT, DPT 11:17 AM  08/19/2024

## 2024-08-23 ENCOUNTER — Other Ambulatory Visit: Payer: Self-pay | Admitting: Internal Medicine

## 2025-05-01 ENCOUNTER — Ambulatory Visit
